# Patient Record
Sex: Male | Born: 1960 | Race: White | Hispanic: No | Marital: Married | Smoking: Never smoker
Health system: Southern US, Community
[De-identification: ages and names within clinical notes are randomized; demographics above are authoritative.]

## PROBLEM LIST (undated history)

## (undated) DIAGNOSIS — Z8719 Personal history of other diseases of the digestive system: Secondary | ICD-10-CM

## (undated) DIAGNOSIS — S83231A Complex tear of medial meniscus, current injury, right knee, initial encounter: Secondary | ICD-10-CM

## (undated) DIAGNOSIS — I10 Essential (primary) hypertension: Secondary | ICD-10-CM

## (undated) DIAGNOSIS — C649 Malignant neoplasm of unspecified kidney, except renal pelvis: Secondary | ICD-10-CM

## (undated) DIAGNOSIS — S83271A Complex tear of lateral meniscus, current injury, right knee, initial encounter: Secondary | ICD-10-CM

## (undated) DIAGNOSIS — M1711 Unilateral primary osteoarthritis, right knee: Secondary | ICD-10-CM

## (undated) DIAGNOSIS — F419 Anxiety disorder, unspecified: Secondary | ICD-10-CM

## (undated) DIAGNOSIS — E039 Hypothyroidism, unspecified: Secondary | ICD-10-CM

## (undated) DIAGNOSIS — E119 Type 2 diabetes mellitus without complications: Secondary | ICD-10-CM

## (undated) DIAGNOSIS — I428 Other cardiomyopathies: Secondary | ICD-10-CM

## (undated) DIAGNOSIS — R112 Nausea with vomiting, unspecified: Secondary | ICD-10-CM

## (undated) DIAGNOSIS — C801 Malignant (primary) neoplasm, unspecified: Secondary | ICD-10-CM

## (undated) DIAGNOSIS — B070 Plantar wart: Secondary | ICD-10-CM

## (undated) DIAGNOSIS — D649 Anemia, unspecified: Secondary | ICD-10-CM

## (undated) DIAGNOSIS — E781 Pure hyperglyceridemia: Secondary | ICD-10-CM

## (undated) DIAGNOSIS — H3530 Unspecified macular degeneration: Secondary | ICD-10-CM

## (undated) DIAGNOSIS — I509 Heart failure, unspecified: Secondary | ICD-10-CM

## (undated) DIAGNOSIS — G629 Polyneuropathy, unspecified: Secondary | ICD-10-CM

## (undated) DIAGNOSIS — I4891 Unspecified atrial fibrillation: Secondary | ICD-10-CM

## (undated) DIAGNOSIS — Z9889 Other specified postprocedural states: Secondary | ICD-10-CM

## (undated) DIAGNOSIS — T7840XA Allergy, unspecified, initial encounter: Secondary | ICD-10-CM

## (undated) HISTORY — DX: Unspecified macular degeneration: H35.30

## (undated) HISTORY — DX: Plantar wart: B07.0

## (undated) HISTORY — PX: NEPHRECTOMY: SHX65

## (undated) HISTORY — DX: Essential (primary) hypertension: I10

## (undated) HISTORY — PX: TONSILLECTOMY: SUR1361

## (undated) HISTORY — PX: VASECTOMY: SHX75

## (undated) HISTORY — DX: Personal history of other diseases of the digestive system: Z87.19

## (undated) HISTORY — DX: Allergy, unspecified, initial encounter: T78.40XA

## (undated) HISTORY — PX: KNEE ARTHROSCOPY: SUR90

## (undated) HISTORY — PX: EYE SURGERY: SHX253

## (undated) HISTORY — DX: Pure hyperglyceridemia: E78.1

## (undated) HISTORY — DX: Anxiety disorder, unspecified: F41.9

---

## 1983-05-28 HISTORY — PX: OTHER SURGICAL HISTORY: SHX169

## 2005-01-21 ENCOUNTER — Ambulatory Visit: Payer: Self-pay | Admitting: Internal Medicine

## 2005-02-18 ENCOUNTER — Ambulatory Visit: Payer: Self-pay | Admitting: Internal Medicine

## 2005-03-04 ENCOUNTER — Ambulatory Visit: Payer: Self-pay | Admitting: Internal Medicine

## 2005-03-06 ENCOUNTER — Ambulatory Visit: Payer: Self-pay | Admitting: Gastroenterology

## 2005-03-22 ENCOUNTER — Ambulatory Visit: Payer: Self-pay | Admitting: Gastroenterology

## 2005-03-22 HISTORY — PX: COLONOSCOPY: SHX174

## 2006-05-23 ENCOUNTER — Ambulatory Visit: Payer: Self-pay | Admitting: Internal Medicine

## 2006-05-23 LAB — CONVERTED CEMR LAB
ALT: 32 units/L (ref 0–40)
Albumin: 3.8 g/dL (ref 3.5–5.2)
Alkaline Phosphatase: 41 units/L (ref 39–117)
Basophils Absolute: 0 10*3/uL (ref 0.0–0.1)
Basophils Relative: 0.6 % (ref 0.0–1.0)
Calcium: 9.1 mg/dL (ref 8.4–10.5)
Cholesterol: 190 mg/dL (ref 0–200)
GFR calc non Af Amer: 86 mL/min
Glomerular Filtration Rate, Af Am: 103 mL/min/{1.73_m2}
Glucose, Bld: 98 mg/dL (ref 70–99)
Hemoglobin, Urine: NEGATIVE
LDL DIRECT: 97.9 mg/dL
Leukocytes, UA: NEGATIVE
Lymphocytes Relative: 30.8 % (ref 12.0–46.0)
PSA: 0.97 ng/mL (ref 0.10–4.00)
Platelets: 233 10*3/uL (ref 150–400)
Potassium: 3.7 meq/L (ref 3.5–5.1)
RBC: 4.75 M/uL (ref 4.22–5.81)
RDW: 12.6 % (ref 11.5–14.6)
TSH: 1.76 microintl units/mL (ref 0.35–5.50)
Total Bilirubin: 0.9 mg/dL (ref 0.3–1.2)
Total Protein: 6.4 g/dL (ref 6.0–8.3)
Urobilinogen, UA: 0.2 (ref 0.0–1.0)
VLDL: 68 mg/dL — ABNORMAL HIGH (ref 0–40)
WBC: 5.6 10*3/uL (ref 4.5–10.5)
pH: 5.5 (ref 5.0–8.0)

## 2006-07-23 ENCOUNTER — Ambulatory Visit: Payer: Self-pay | Admitting: Internal Medicine

## 2006-07-28 ENCOUNTER — Ambulatory Visit: Payer: Self-pay | Admitting: Internal Medicine

## 2007-06-19 ENCOUNTER — Ambulatory Visit: Payer: Self-pay | Admitting: Internal Medicine

## 2007-06-21 DIAGNOSIS — F418 Other specified anxiety disorders: Secondary | ICD-10-CM | POA: Insufficient documentation

## 2007-06-21 DIAGNOSIS — E781 Pure hyperglyceridemia: Secondary | ICD-10-CM | POA: Insufficient documentation

## 2007-06-21 DIAGNOSIS — B070 Plantar wart: Secondary | ICD-10-CM | POA: Insufficient documentation

## 2007-06-21 DIAGNOSIS — I110 Hypertensive heart disease with heart failure: Secondary | ICD-10-CM | POA: Insufficient documentation

## 2007-06-21 DIAGNOSIS — J309 Allergic rhinitis, unspecified: Secondary | ICD-10-CM | POA: Insufficient documentation

## 2007-06-21 DIAGNOSIS — H3530 Unspecified macular degeneration: Secondary | ICD-10-CM | POA: Insufficient documentation

## 2007-06-21 DIAGNOSIS — Z8719 Personal history of other diseases of the digestive system: Secondary | ICD-10-CM | POA: Insufficient documentation

## 2007-08-26 ENCOUNTER — Ambulatory Visit: Payer: Self-pay | Admitting: Psychology

## 2007-09-28 ENCOUNTER — Ambulatory Visit: Payer: Self-pay | Admitting: Psychology

## 2007-11-13 ENCOUNTER — Ambulatory Visit: Payer: Self-pay | Admitting: Psychology

## 2007-11-18 ENCOUNTER — Encounter: Admission: RE | Admit: 2007-11-18 | Discharge: 2007-11-18 | Payer: Self-pay | Admitting: Orthopedic Surgery

## 2007-11-18 ENCOUNTER — Telehealth: Payer: Self-pay | Admitting: Internal Medicine

## 2007-11-25 ENCOUNTER — Ambulatory Visit: Payer: Self-pay | Admitting: Internal Medicine

## 2008-01-04 ENCOUNTER — Ambulatory Visit: Payer: Self-pay | Admitting: Psychology

## 2008-04-08 ENCOUNTER — Ambulatory Visit: Payer: Self-pay | Admitting: Psychology

## 2008-04-18 ENCOUNTER — Telehealth: Payer: Self-pay | Admitting: Internal Medicine

## 2008-06-03 ENCOUNTER — Ambulatory Visit: Payer: Self-pay | Admitting: Psychology

## 2008-06-17 ENCOUNTER — Ambulatory Visit: Payer: Self-pay | Admitting: Psychology

## 2008-07-15 ENCOUNTER — Ambulatory Visit: Payer: Self-pay | Admitting: Psychology

## 2008-08-12 ENCOUNTER — Ambulatory Visit: Payer: Self-pay | Admitting: Psychology

## 2008-09-02 ENCOUNTER — Telehealth: Payer: Self-pay | Admitting: Internal Medicine

## 2008-09-14 ENCOUNTER — Ambulatory Visit: Payer: Self-pay | Admitting: Internal Medicine

## 2008-11-04 ENCOUNTER — Ambulatory Visit: Payer: Self-pay | Admitting: Psychology

## 2008-12-30 ENCOUNTER — Ambulatory Visit: Payer: Self-pay | Admitting: Psychology

## 2009-01-27 ENCOUNTER — Ambulatory Visit: Payer: Self-pay | Admitting: Psychology

## 2009-03-17 ENCOUNTER — Ambulatory Visit: Payer: Self-pay | Admitting: Psychology

## 2009-06-09 ENCOUNTER — Ambulatory Visit: Payer: Self-pay | Admitting: Psychology

## 2009-07-20 ENCOUNTER — Ambulatory Visit: Payer: Self-pay | Admitting: Psychology

## 2009-09-25 ENCOUNTER — Telehealth: Payer: Self-pay | Admitting: Internal Medicine

## 2009-10-16 ENCOUNTER — Ambulatory Visit: Payer: Self-pay | Admitting: Internal Medicine

## 2009-10-16 LAB — CONVERTED CEMR LAB
ALT: 23 units/L (ref 0–53)
Bilirubin Urine: NEGATIVE
Bilirubin, Direct: 0.1 mg/dL (ref 0.0–0.3)
Calcium: 8.8 mg/dL (ref 8.4–10.5)
Cholesterol: 199 mg/dL (ref 0–200)
Creatinine, Ser: 0.9 mg/dL (ref 0.4–1.5)
Eosinophils Absolute: 0.2 10*3/uL (ref 0.0–0.7)
Eosinophils Relative: 4.1 % (ref 0.0–5.0)
GFR calc non Af Amer: 101.38 mL/min (ref >60)
HCT: 41.9 % (ref 39.0–52.0)
HDL: 33.1 mg/dL — ABNORMAL LOW (ref >39.00)
Ketones, ur: NEGATIVE mg/dL
Lymphs Abs: 1.3 10*3/uL (ref 0.7–4.0)
MCHC: 34.8 g/dL (ref 30.0–36.0)
MCV: 94.5 fL (ref 78.0–100.0)
Monocytes Absolute: 0.4 10*3/uL (ref 0.1–1.0)
Neutrophils Relative %: 58.8 % (ref 43.0–77.0)
Nitrite: NEGATIVE
PSA: 1.72 ng/mL (ref 0.10–4.00)
Platelets: 246 10*3/uL (ref 150.0–400.0)
Sodium: 140 meq/L (ref 135–145)
TSH: 1.84 microintl units/mL (ref 0.35–5.50)
Total Bilirubin: 0.8 mg/dL (ref 0.3–1.2)
Total Protein: 6.2 g/dL (ref 6.0–8.3)
Triglycerides: 358 mg/dL — ABNORMAL HIGH (ref 0.0–149.0)
Urobilinogen, UA: 0.2 (ref 0.0–1.0)
pH: 7.5 (ref 5.0–8.0)

## 2009-10-26 ENCOUNTER — Ambulatory Visit: Payer: Self-pay | Admitting: Internal Medicine

## 2009-11-03 ENCOUNTER — Ambulatory Visit: Payer: Self-pay | Admitting: Psychology

## 2010-02-09 ENCOUNTER — Ambulatory Visit: Payer: Self-pay | Admitting: Psychology

## 2010-06-28 NOTE — Assessment & Plan Note (Signed)
Summary: CPX / [employer] / # / CD   Vital Signs:  Patient profile:   50 year old male Height:      74 inches (187.96 cm) Weight:      257.0 pounds (116.82 kg) BMI:     33.12 O2 Sat:      98 % on Room air Temp:     99.2 degrees F (37.33 degrees C) oral Pulse rate:   78 / minute BP sitting:   130 / 92  (left arm) Cuff size:   large  Vitals Entered By: Orlan Leavens (October 26, 2009 10:16 AM)  O2 Flow:  Room air CC: cpx Is Patient Diabetic? No Pain Assessment Patient in pain? no        Primary Care Provider:  Topaz Raglin  CC:  cpx.  History of Present Illness: Presents for a wellness exam. He has been doing well since his last CPX in '09. He has had two visits for acute issues 4/09 and 7/10. He has been seeing Dr. Dellia Cloud intermittently. He has had no health issues: no major illness, injury or surgery.   He does report that his right knee will lock. Had arthroscopy in the past for torn meniscus.  Having some pain right shoulder, worse with work  Current Medications (verified): 1)  Lisinopril-Hydrochlorothiazide 20-25 Mg Tabs (Lisinopril-Hydrochlorothiazide) .Marland Kitchen.. 1 By Mouth Once Daily 2)  Niaspan 500 Mg Tbcr (Niacin (Antihyperlipidemic)) .... Take 1 Tablet By Mouth Once A Day 3)  Paxil Cr 12.5 Mg  Tb24 (Paroxetine Hcl) .... 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   Review of Systems  The patient denies anorexia, fever, weight loss, weight gain, vision loss, decreased hearing, chest pain, dyspnea on exertion, prolonged cough, hemoptysis, abdominal pain, hematochezia, hematuria, muscle weakness, transient blindness, difficulty walking, abnormal bleeding, and angioedema.    Physical Exam  General:  tall large framed white male in no distress Head:  Normocephalic and atraumatic without obvious abnormalities. No apparent alopecia or balding. Eyes:  No corneal or conjunctival inflammation noted. EOMI. Perrla. Funduscopic exam benign, without hemorrhages, exudates or papilledema. Vision grossly normal. Ears:  External ear exam shows no significant lesions or deformities.  Otoscopic examination reveals clear canals, tympanic membranes are intact bilaterally without bulging, retraction, inflammation or discharge. Hearing is grossly normal bilaterally. Nose:  no external deformity and no external erythema.   Mouth:  Oral mucosa and oropharynx without lesions or exudates.  Teeth in good repair. Neck:  supple, full ROM, no thyromegaly, and no carotid bruits.   Chest Wall:  No deformities, masses, tenderness or gynecomastia noted. Lungs:  Normal respiratory effort, chest expands symmetrically. Lungs are clear to auscultation, no crackles or wheezes. Heart:  Normal rate and regular rhythm. S1 and S2 normal without gallop, murmur, click, rub or other extra sounds. Abdomen:  soft, non-tender, normal bowel sounds, no guarding, and no hepatomegaly.   Rectal:  No external abnormalities noted. Normal sphincter tone. No rectal masses or tenderness. Prostate:  Prostate gland firm and smooth, no  enlargement, nodularity, tenderness, mass, asymmetry or induration. Msk:  normal ROM, no joint tenderness, no joint swelling, no joint warmth, and no joint instability.   Pulses:  2+ radial pulses Extremities:  No clubbing, cyanosis, edema, or deformity noted with normal full range of motion of all joints.   Neurologic:  alert & oriented X3, cranial nerves II-XII intact, strength normal in all extremities, gait normal, and DTRs symmetrical and normal.   Skin:  turgor normal, color normal, and no suspicious lesions.   Cervical Nodes:  no anterior cervical adenopathy and no posterior cervical adenopathy.   Inguinal Nodes:  no R inguinal adenopathy and no L inguinal adenopathy.   Psych:  Oriented X3, memory intact for recent and remote, normally interactive, good eye contact, and not anxious appearing.     Impression & Recommendations:  Problem # 1:  ANXIETY DISORDER, ACUTE (ICD-300.00) Patient is doing well by his report. He will still check in with Dr. Dellia Cloud as needed.  Plan - continue present meds.  His updated medication list for this problem includes:    Paxil Cr 12.5 Mg Tb24 (Paroxetine hcl) .Marland Kitchen... 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  Plan - drug holiday with repeat lipid panel in 6-8 weeks. If triglycerides are unchanged but LDL is higher will consider alternative medication, e.g. statin product.  His updated medication list for this problem includes:    Niaspan 500 Mg Tbcr (Niacin (antihyperlipidemic)) .Marland Kitchen... Take 1 tablet by mouth once a day  Problem # 3:  HYPERTENSION (ICD-401.9)  His updated medication list for this problem includes:    Lisinopril-hydrochlorothiazide 20-25 Mg Tabs (Lisinopril-hydrochlorothiazide) .Marland Kitchen... 1 by mouth once daily  BP today: 130/92 Prior BP: 142/94 (09/14/2008)  Labs  Reviewed: K+: 4.0 (10/16/2009) Creat: : 0.9 (10/16/2009)     Good control on present medication - will continue the same.   Problem # 4:  Preventive Health Care (ICD-V70.0) unremarkable interval history and a normal exam. Lab results are fine except for elevation in Triglycerides. Colonoscopy in '06, tetnus booster in '10. Discussed the need for continuing a regular exercise program for stress mangement and for weight management.  In summary - a very nice man who is medically stable on his present regimen. He will return for lab as noted and for routine exam in 1 year.  Complete Medication List: 1)  Lisinopril-hydrochlorothiazide 20-25 Mg Tabs (Lisinopril-hydrochlorothiazide) .Marland Kitchen.. 1 by mouth once daily 2)  Niaspan 500 Mg Tbcr (Niacin (antihyperlipidemic)) .... Take 1 tablet by mouth once a day 3)  Paxil Cr 12.5 Mg Tb24 (Paroxetine hcl) .... Take 1 tablet by mouth once a day  Other Orders: EKG w/ Interpretation (93000) Orthopedic Surgeon Referral (Ortho Surgeon)  Patient: Gregory [name] Note: All result statuses are Final unless otherwise noted.  Tests: (1) BMP (METABOL)   Sodium  140 mEq/L                   135-145   Potassium                 4.0 mEq/L                   3.5-5.1   Chloride                  103 mEq/L                   96-112   Carbon Dioxide            32 mEq/L                    19-32   Glucose                   89 mg/dL                    44-03   BUN                  [H]  24 mg/dL                    4-74   Creatinine                0.9 mg/dL                   2.5-9.5   Calcium                   8.8 mg/dL                   6.3-87.5   GFR                       101.38 mL/min               >60  Tests: (2) Lipid Panel (LIPID)   Cholesterol               199 mg/dL                   6-433     ATP III Classification            Desirable:  < 200 mg/dL                    Borderline High:  200 - 239 mg/dL               High:  > = 240 mg/dL   Triglycerides         [H]  358.0 mg/dL                 2.9-518.8     Normal:  <150 mg/dL     Borderline High:  416 - 199 mg/dL   HDL                  [L]  60.63 mg/dL                 >01.60   VLDL Cholesterol     [H]  71.6 mg/dL                  1.0-93.2  CHO/HDL Ratio:  CHD Risk  6                    Men          Women     1/2 Average Risk     3.4          3.3     Average Risk          5.0          4.4     2X Average Risk          9.6          7.1     3X Average Risk          15.0          11.0                           Tests: (3) CBC Platelet w/Diff (CBCD)   White Cell Count          4.8 K/uL                    4.5-10.5   Red Cell Count            4.44 Mil/uL                 4.22-5.81   Hemoglobin                14.6 g/dL                   16.1-09.6   Hematocrit                41.9 %                      39.0-52.0   MCV                       94.5 fl                     78.0-100.0   MCHC                      34.8 g/dL                   04.5-40.9   RDW                       13.5 %                      11.5-14.6   Platelet Count            246.0 K/uL                  150.0-400.0   Neutrophil %              58.8 %                      43.0-77.0   Lymphocyte %              27.3 %                      12.0-46.0   Monocyte %                9.2 %  3.0-12.0   Eosinophils%              4.1 %                       0.0-5.0   Basophils %               0.6 %                       0.0-3.0   Neutrophill Absolute      2.8 K/uL                    1.4-7.7   Lymphocyte Absolute       1.3 K/uL                    0.7-4.0   Monocyte Absolute         0.4 K/uL                    0.1-1.0  Eosinophils, Absolute                             0.2 K/uL                    0.0-0.7   Basophils Absolute        0.0 K/uL                    0.0-0.1  Tests: (4) Hepatic/Liver Function Panel (HEPATIC)   Total Bilirubin           0.8 mg/dL                   1.6-1.0   Direct Bilirubin           0.1 mg/dL                   9.6-0.4   Alkaline Phosphatase      45 U/L                      39-117   AST                       24 U/L                      0-37   ALT                       23 U/L                      0-53   Total Protein             6.2 g/dL                    5.4-0.9   Albumin                   4.1 g/dL                    8.1-1.9  Tests: (5) TSH (TSH)   FastTSH                   1.84 uIU/mL  0.35-5.50  Tests: (6) Prostate Specific Antigen (PSA)   PSA-Hyb                   1.72 ng/mL                  0.10-4.00  Tests: (7) UDip w/Micro (URINE)   Color                     YELLOW       RANGE:  Yellow;Lt. Yellow   Clarity                   CLEAR                       Clear   Specific Gravity          1.015                       1.000 - 1.030   Urine Ph                  7.5                         5.0-8.0   Protein                   NEGATIVE                    Negative   Urine Glucose             NEGATIVE                    Negative   Ketones                   NEGATIVE                    Negative   Urine Bilirubin           NEGATIVE                    Negative   Blood                     NEGATIVE                    Negative   Urobilinogen              0.2                         0.0 - 1.0   Leukocyte Esterace        TRACE                       Negative   Nitrite                   NEGATIVE                    Negative   Urine WBC                 3-6/hpf                     0-2/hpf   Urine Epith  Rare(0-4/hpf)               Rare(0-4/hpf)   Urine Bacteria            Rare(<10/hpf)               None     amorphous sediment present  Tests: (8) Cholesterol LDL - Direct (DIRLDL)  Cholesterol LDL - Direct                             110.9 mg/dL

## 2010-06-28 NOTE — Progress Notes (Signed)
Summary: REFILLs - [facility]  Phone Note Refill Request   Refills Requested: Medication #1:  LISINOPRIL-HYDROCHLOROTHIAZIDE 20-25 MG TABS 1 by mouth once daily  Medication #2:  NIASPAN 500 MG TBCR Take 1 tablet by mouth once a day  Medication #3:  PAXIL CR 12.5 MG  TB24 Take 1 tablet by mouth once a day Pt req rx for 3 mth supply to go to [facility]. OK?  Initial call taken by: Lamar Sprinkles, CMA,  Sep 25, 2009 11:02 AM  Follow-up for Phone Call        last seen by Dr. Jonny Ruiz in april - ok to refill medications as requested.  Follow-up by: Jacques Navy MD,  Sep 25, 2009 12:55 PM    Prescriptions: PAXIL CR 12.5 MG  TB24 (PAROXETINE HCL) Take 1 tablet by mouth once a day  #90 x 3   Entered by:   Lamar Sprinkles, CMA   Authorized by:   Jacques Navy MD   Signed by:   Lamar Sprinkles, CMA on 09/25/2009   Method used:   Faxed to ...       [facility]* (mail-order)             ,          Ph: [phone]       Fax: [phone]   RxID:   [id number] NIASPAN 500 MG TBCR (NIACIN (ANTIHYPERLIPIDEMIC)) Take 1 tablet by mouth once a day  #90 x 3   Entered by:   Lamar Sprinkles, CMA   Authorized by:   Jacques Navy MD   Signed by:   Lamar Sprinkles, CMA on 09/25/2009   Method used:   Faxed to ...       [facility]* (mail-order)             ,          Ph: [phone]       Fax: [phone]   RxID:   [id number] LISINOPRIL-HYDROCHLOROTHIAZIDE 20-25 MG TABS (LISINOPRIL-HYDROCHLOROTHIAZIDE) 1 by mouth once daily  #90 x 3   Entered by:   Lamar Sprinkles, CMA   Authorized by:   Jacques Navy MD   Signed by:   Lamar Sprinkles, CMA on 09/25/2009   Method used:   Faxed to ...       [facility]* (mail-order)             ,          Ph: [phone]       Fax: [phone]   RxID:   [id number]

## 2010-09-10 ENCOUNTER — Other Ambulatory Visit: Payer: Self-pay | Admitting: Internal Medicine

## 2010-10-12 NOTE — Assessment & Plan Note (Signed)
[facility]                           PRIMARY CARE OFFICE NOTE   NAME:Gregory [name], Gregory [name]                        MRN:          [id number]  DATE:07/28/2006                            DOB:          01/01/1961    Gregory [name] is a 50 year old gentleman who presents for a wellness  examination and followup of his hypertension.  He was last seen in the  office on July 23, 2006, for what appeared to be a possible  sinusitis treated with Ceftin, nasal spray.   The patient reports he has had no other medical problems or issues since  his last exam March 04, 2005.   PAST MEDICAL HISTORY:  Surgical:  1. Tonsillectomy.  2. Fractured fibula with repair.  3. Sebaceous cyst removed from left back in 1985.  4. Vasectomy in 2002.   Medical illnesses:  1. Usual childhood disease.  2. Hypertension.  3. Acute macular disease August 2003.  4. GI bleed, August 2006.  5. History of recurrent sinus congestion and infections in the past.   CURRENT MEDICATIONS:  Lisinopril HCT 20/12.5 once daily.   HABITS:  Tobacco:  None.  Alcohol:  Social.   FAMILY HISTORY:  Positive for bipolar disease and hypertension in his  father.  Mother passed away from a brain tumor in 2001.  No history of  colon cancer, coronary artery disease or diabetes.   SOCIAL HISTORY:  The patient is married for 18 years.  He has a 12-year-  old son and a 37 year old daughter and a 55-year-old son.  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  He recently has gone through a year-long business  venture, buying out his partner, expanding his ownership and equity  positions.  He now has a larger territory for marketing his product, has  22 employees.  This has taken away time from his personal health and  workout regimen.   REVIEW OF SYSTEMS:  The patient has had no constitutional,  cardiovascular, respiratory, GI, GU, or musculoskeletal  complaints.   EXAMINATION:  VITAL SIGNS:  Temperature is 97.2, blood pressure 128/80,  pulse 69, weight 256, height 6 feet 4 inches.  GENERAL APPEARANCE:  This is a heavyset Caucasian gentleman in no acute  distress.  HEENT:  Normocephalic, atraumatic.  EACs and TMs were unremarkable.  Oropharynx with native dentition in good repair.  No buccal or palatal  lesions were noted.  Posterior pharynx was clear.  Conjunctivae and  sclerae were clear.  PERRLA, EOMI.  Funduscopic exam was unremarkable.  NECK:  Supple without thyromegaly.  NODES:  No adenopathy was noted in the cervical or supraclavicular  regions.  CHEST:  No CVA tenderness.  LUNGS:  Clear to auscultation and percussion.  CARDIOVASCULAR:  Shows 2+ radial pulse, no JVD or carotid bruits.  He  had a quiet precordium with regular rate and rhythm without murmurs,  rubs or gallops.  ABDOMEN:  Soft, no guarding, no rebound.  No organomegaly was noted.  GENITALIA:  Normal male phallus.  Bilaterally descended testicles  without masses.  RECTAL:  Revealed normal sphincter tone.  Prostate was smooth, normal in  size and contour.  EXTREMITIES:  Without clubbing, cyanosis, edema or deformity.  NEUROLOGIC:  Nonfocal.   Laboratory from May 23, 2006:  Hemoglobin 15.2 g, white count was  5600 with normal differential.  Cholesterol was 190, triglycerides 341,  HDL 26.5, LDL cholesterol 16.1.  Chemistries were normal with a blood  sugar of 98.  Electrolytes were normal.  Kidney function normal with a  creatinine of 1.0 and GFR of 86 mL/minute.  Liver functions were normal.  Thyroid function normal with a TSH of 1.76.  PSA was normal at 0.97.  Urinalysis was normal.   ASSESSMENT AND PLAN:  1. Hypertension:  Excellent control on his present regimen.  He will      continue the same.  2. Hypertriglyceridemia:  In reviewing the patient's chart, he has had      elevated triglyceride level going back to 2000.  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  The patient also with a      chronically-low HDL.  Plan:  The patient is to start Niaspan 500 mg      q.h.s., prescription provided, with the plan to advance to 1000 mg      over time but will check a repeat lipid panel in 4-6 weeks before      increasing his dose.  The patient also with lifestyle treatment to      increase his exercise and be more careful with his diet.  If he      loses 10% of body weight or 25 pounds, will consider a drug holiday      in regards to lipid management.  3. Health maintenance:  The patient did undergo colonoscopy March 22, 2005, which was a normal study except for internal hemorrhoids.      He will be due for a followup in 2016.  Prostate exam was      unremarkable.  PSA is stable.   In summary, this is a very pleasant gentleman who is medically stable at  this time.  He will return in 4-6 weeks for followup lipid panel.     Rosalyn Gess Norins, MD  Electronically Signed    MEN/MedQ  DD: 07/29/2006  DT: 07/29/2006  Job #: [id number]   cc:   Madaline Savage[employer]

## 2010-10-15 ENCOUNTER — Telehealth: Payer: Self-pay | Admitting: Internal Medicine

## 2010-10-15 MED ORDER — PAROXETINE HCL ER 12.5 MG PO TB24: 12.5000 mg | ORAL_TABLET | ORAL | Status: DC

## 2010-10-15 NOTE — Telephone Encounter (Signed)
Pt needs 3 mos supply of Paroxetine. Pt states he only rec'd 1 mo supply although he requested 3 mos supply. He did contact [facility] and they advised him to call his PCP and request kit be the 3 mos supply that is sent in and/or new prescription stating this. Pt has 4 pills left. Pt states he did send an email to Dr Debby Bud regarding this as well

## 2010-10-15 NOTE — Telephone Encounter (Signed)
Left vm for pt, RF sent to [facility]

## 2010-10-15 NOTE — Telephone Encounter (Signed)
Ok for refill 90 days with 3 refills. If needed 14 day local Rx is ok.

## 2011-03-25 ENCOUNTER — Encounter (INDEPENDENT_AMBULATORY_CARE_PROVIDER_SITE_OTHER): Payer: 59 | Admitting: Ophthalmology

## 2011-03-25 DIAGNOSIS — H43819 Vitreous degeneration, unspecified eye: Secondary | ICD-10-CM

## 2011-03-25 DIAGNOSIS — H3530 Unspecified macular degeneration: Secondary | ICD-10-CM

## 2011-03-25 DIAGNOSIS — H2510 Age-related nuclear cataract, unspecified eye: Secondary | ICD-10-CM

## 2011-07-06 ENCOUNTER — Other Ambulatory Visit: Payer: Self-pay | Admitting: Internal Medicine

## 2011-07-15 ENCOUNTER — Telehealth: Payer: Self-pay | Admitting: Internal Medicine

## 2011-07-15 MED ORDER — LISINOPRIL-HYDROCHLOROTHIAZIDE 20-25 MG PO TABS: ORAL_TABLET | ORAL | Status: DC

## 2011-07-15 NOTE — Telephone Encounter (Signed)
Ok fopr refill on lisinopril for 90 days

## 2011-07-15 NOTE — Telephone Encounter (Signed)
Done

## 2011-07-15 NOTE — Telephone Encounter (Signed)
PT HAS MADE HIS PHYSICAL APPT FOR MARCH 26.  HE NEEDS REFILLS ON LISOPRIL.  HE GETS A 90 DAY SUPPLY FROM [facility].

## 2011-08-20 ENCOUNTER — Other Ambulatory Visit (INDEPENDENT_AMBULATORY_CARE_PROVIDER_SITE_OTHER): Payer: 59

## 2011-08-20 ENCOUNTER — Ambulatory Visit (INDEPENDENT_AMBULATORY_CARE_PROVIDER_SITE_OTHER): Payer: 59 | Admitting: Internal Medicine

## 2011-08-20 ENCOUNTER — Encounter: Payer: Self-pay | Admitting: Internal Medicine

## 2011-08-20 VITALS — BP 122/84 | HR 67 | Temp 97.5°F | Resp 14 | Ht 72.25 in | Wt 261.8 lb

## 2011-08-20 DIAGNOSIS — Z0000 Encounter for general adult medical examination without abnormal findings: Secondary | ICD-10-CM

## 2011-08-20 DIAGNOSIS — Z125 Encounter for screening for malignant neoplasm of prostate: Secondary | ICD-10-CM

## 2011-08-20 DIAGNOSIS — I10 Essential (primary) hypertension: Secondary | ICD-10-CM

## 2011-08-20 DIAGNOSIS — E781 Pure hyperglyceridemia: Secondary | ICD-10-CM

## 2011-08-20 DIAGNOSIS — E669 Obesity, unspecified: Secondary | ICD-10-CM

## 2011-08-20 DIAGNOSIS — M25519 Pain in unspecified shoulder: Secondary | ICD-10-CM

## 2011-08-20 DIAGNOSIS — N489 Disorder of penis, unspecified: Secondary | ICD-10-CM

## 2011-08-20 DIAGNOSIS — M25512 Pain in left shoulder: Secondary | ICD-10-CM

## 2011-08-20 DIAGNOSIS — F411 Generalized anxiety disorder: Secondary | ICD-10-CM

## 2011-08-20 DIAGNOSIS — N4889 Other specified disorders of penis: Secondary | ICD-10-CM

## 2011-08-20 LAB — COMPREHENSIVE METABOLIC PANEL
ALT: 34 U/L (ref 0–53)
AST: 32 U/L (ref 0–37)
Albumin: 4.3 g/dL (ref 3.5–5.2)
Alkaline Phosphatase: 40 U/L (ref 39–117)
Potassium: 4.2 mEq/L (ref 3.5–5.1)
Sodium: 137 mEq/L (ref 135–145)
Total Bilirubin: 0.9 mg/dL (ref 0.3–1.2)
Total Protein: 7.3 g/dL (ref 6.0–8.3)

## 2011-08-20 LAB — LIPID PANEL
HDL: 33.3 mg/dL — ABNORMAL LOW (ref >39.00)
Total CHOL/HDL Ratio: 6
Triglycerides: 280 mg/dL — ABNORMAL HIGH (ref 0.0–149.0)
VLDL: 56 mg/dL — ABNORMAL HIGH (ref 0.0–40.0)

## 2011-08-20 LAB — TSH: TSH: 1.41 u[IU]/mL (ref 0.35–5.50)

## 2011-08-20 NOTE — Progress Notes (Signed)
Subjective:    Patient ID: Gregory [name], male    DOB: [birth year], 51 y.o.   MRN: [id number]  HPI Gregory [name] presents for rouitne medical exam. CC: painful left shoulder question of internal derangement-torn rotator. He has had a penile infection - yeast possibly for many months which will presents a dry,cracking of the skin of the shaft. He also had a friction type injury at the base of the shaft. He is very concerned about weight management and we discussed the principles of weight management.   Past History:  Past Medical History: Last updated: Jun 25, 2007 UCD Allergic rhinitis Gastrointestinal hemorrhage, hx of Aug '06 Hypertension Macular disease, acute August '03 hypertriglyceridemia plantar warts anxiety/stress    Physician Roster:           Derm - lupton           GU- Dahlstedt           Opthal Jena Gauss  Past Surgical History: Last updated: Jun 25, 2007 Tonsillectomy Vasectomy '02 ORIF fx fibula excision sebaceous cyst - back '85 Arthroscopic surgery right knee - repair or torn miniscus '00, '12 (Ron Gioffre) - with 2nd repair had superficial infection Family History: Last updated: 06-25-07 father- deceased; HTN, bipolar disease mother - deceased brain tumor Neg - colon, prostate cancer, CAD, DM  Social History: Reviewed history from 06/25/2007 and no changes required. 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    Review of Systems Constitutional:  Negative for fever, chills, activity change and unexpected weight change.  HEENT:  Negative for hearing loss, ear pain, congestion, neck stiffness and postnasal drip. Negative for sore throat or swallowing problems. Negative for dental complaints.   Eyes: Negative for vision loss or change in visual acuity.  Respiratory: Negative for chest tightness and wheezing. Negative for DOE.     Cardiovascular: Negative for chest pain or palpitations. No decreased exercise tolerance Gastrointestinal: No change in bowel habit. No bloating or gas. No reflux or indigestion Genitourinary: Negative for urgency, frequency, flank pain and difficulty urinating.  Musculoskeletal: Negative for myalgias, back pain, arthralgias and gait problem.  Neurological: Negative for dizziness, tremors, weakness and headaches.  Hematological: Negative for adenopathy.  Psychiatric/Behavioral: Negative for behavioral problems and dysphoric mood.       Objective:   Physical Exam Filed Vitals:   08/20/11 1439  BP: 122/84  Pulse: 67  Temp: 97.5 F (36.4 C)  Resp: 14   Wt Readings from Last 3 Encounters:  08/20/11 261 lb 12 oz (118.729 kg)  10/26/09 257 lb (116.574 kg)  09/14/08 259 lb 6.1 oz (117.655 kg)   Gen'l: Well nourished well developed overweight white man in no acute distress  HEENT: Head: Normocephalic and atraumatic. Right Ear: External ear normal. EAC/TM nl. Left Ear: External ear normal.  EAC/TM nl. Nose: Nose normal. Mouth/Throat: Oropharynx is clear and moist. Dentition - native, in good repair. No buccal or palatal lesions. Posterior pharynx clear. Eyes: Conjunctivae and sclera clear. EOM intact. Pupils are equal, round, and reactive to light. Right eye exhibits no discharge. Left eye exhibits no discharge. Neck: Normal range of motion. Neck supple. No JVD present. No tracheal deviation present. No thyromegaly present.  Cardiovascular: Normal rate, regular rhythm, no gallop, no friction rub, no murmur heard.      Quiet precordium. 2+ radial and DP pulses . No carotid bruits Pulmonary/Chest: Effort normal.  No respiratory distress or increased WOB, no wheezes, no rales. No chest wall deformity or CVAT. Abdominal: Soft. Bowel sounds are normal in all quadrants. He exhibits no distension, no tenderness, no rebound or guarding, No heptosplenomegaly  Genitourinary:  Glans penis and distal  shaft with skin change - thickened white skin, firm with a central ulcer just below the frenulum. No urethral discharge. Musculoskeletal: Normal range of motion. He exhibits no edema and no tenderness.       Small and large joints without redness, synovial thickening or deformity. Full range of motion preserved about all small, median and large joints, including the left shoulder.  Lymphadenopathy:    He has no cervical or supraclavicular adenopathy.  Neurological: He is alert and oriented to person, place, and time. CN II-XII intact. DTRs 2+ and symmetrical biceps, radial and patellar tendons. Cerebellar function normal with no tremor, rigidity, normal gait and station.  Skin: Skin is warm and dry. No rash noted. No erythema. Glans penis and below with skin thickening and pallor, shiny appearance with ulceration 2 mm diameter. Psychiatric: He has a normal mood and affect. His behavior is normal. Thought content normal.   Lab Results  Component Value Date   WBC 4.8 10/16/2009   HGB 14.6 10/16/2009   HCT 41.9 10/16/2009   PLT 246.0 10/16/2009   GLUCOSE 83 08/20/2011   CHOL 197 08/20/2011   TRIG 280.0* 08/20/2011   HDL 33.30* 08/20/2011   LDLDIRECT 117.5 08/20/2011   ALT 34 08/20/2011   AST 32 08/20/2011   NA 137 08/20/2011   K 4.2 08/20/2011   CL 98 08/20/2011   CREATININE 0.9 08/20/2011   BUN 20 08/20/2011   CO2 30 08/20/2011   TSH 1.41 08/20/2011   PSA 1.17 08/20/2011         Assessment & Plan:  Left shoulder pain - chronic problem Plan- he is given the name of Dr. Annell Greening for ortho consult. He will handle his own appointment scheduling  Skin change penis - this does not look like a yeast infection. Uncertain as to diagnosis Plan - he has seen Dr. Terri Piedra in the past and will schedule his own appointment for evaluation.

## 2011-08-22 DIAGNOSIS — Z0000 Encounter for general adult medical examination without abnormal findings: Secondary | ICD-10-CM | POA: Insufficient documentation

## 2011-08-22 DIAGNOSIS — E669 Obesity, unspecified: Secondary | ICD-10-CM | POA: Insufficient documentation

## 2011-08-22 NOTE — Assessment & Plan Note (Signed)
Discussed weight management: smart food choices, PORTION SIZE - meal of 1,000 chews, regular exercise  Target weight 250 lbs; goal to loose 1-2 lbs per month

## 2011-08-22 NOTE — Assessment & Plan Note (Signed)
triglycerides still mildly elevated. LDL is better than goal of 130 or less.  Plan - life-style management with a low fat low carb diet and regular exercise.

## 2011-08-22 NOTE — Assessment & Plan Note (Signed)
Interval medical history is negative for any serious illness, surgery or injury. PHysical exam notable for skin change penis otherwise normal. Lab results are good. He is a candidate for colonoscopy if not already done. Immunizations: tetanus is current.  He will be referred to ortho Gregory [name] for shoulder evaluation. He will make his own appointment with Dr. Terri [name] in regard to the skin changes distal penis.   He will return for routine follow-up in 12 months, sooner as needed.

## 2011-08-22 NOTE — Assessment & Plan Note (Signed)
Very stable. He continues on low dose paxil, tolerated well.

## 2011-08-22 NOTE — Assessment & Plan Note (Signed)
BP Readings from Last 3 Encounters:  08/20/11 122/84  10/26/09 130/92  09/14/08 142/94   Adequate control on present medication

## 2011-10-08 ENCOUNTER — Telehealth: Payer: Self-pay | Admitting: *Deleted

## 2011-10-08 ENCOUNTER — Other Ambulatory Visit: Payer: Self-pay

## 2011-10-08 MED ORDER — PAROXETINE HCL ER 12.5 MG PO TB24: 12.5000 mg | ORAL_TABLET | ORAL | Status: DC

## 2011-10-08 MED ORDER — LISINOPRIL-HYDROCHLOROTHIAZIDE 20-25 MG PO TABS: ORAL_TABLET | ORAL | Status: DC

## 2011-10-08 NOTE — Telephone Encounter (Signed)
Sent refill back to [facility]... 10/08/1322:20pm/LMB

## 2012-01-01 ENCOUNTER — Other Ambulatory Visit: Payer: Self-pay

## 2012-01-01 MED ORDER — LISINOPRIL-HYDROCHLOROTHIAZIDE 20-25 MG PO TABS: ORAL_TABLET | ORAL | Status: DC

## 2012-03-25 ENCOUNTER — Encounter (INDEPENDENT_AMBULATORY_CARE_PROVIDER_SITE_OTHER): Payer: 59 | Admitting: Ophthalmology

## 2012-03-25 DIAGNOSIS — H3530 Unspecified macular degeneration: Secondary | ICD-10-CM

## 2012-03-25 DIAGNOSIS — H43819 Vitreous degeneration, unspecified eye: Secondary | ICD-10-CM

## 2012-03-25 DIAGNOSIS — H2510 Age-related nuclear cataract, unspecified eye: Secondary | ICD-10-CM

## 2012-04-09 ENCOUNTER — Other Ambulatory Visit: Payer: Self-pay | Admitting: Internal Medicine

## 2012-07-14 ENCOUNTER — Other Ambulatory Visit: Payer: Self-pay | Admitting: Internal Medicine

## 2012-08-24 ENCOUNTER — Telehealth: Payer: Self-pay | Admitting: Internal Medicine

## 2012-08-24 NOTE — Telephone Encounter (Signed)
Yes, he can have refills as requested.

## 2012-08-24 NOTE — Telephone Encounter (Signed)
We called Mr. Wilczak to reschedule his April 16th physical to June 5.  He is concerned about refills.  Could he have a refill on the Paxil-CR and Lisinopril to last until June 5?  His last physical was August 20, 2011 He uses [facility] in pharmacy.

## 2012-08-25 MED ORDER — PAROXETINE HCL ER 12.5 MG PO TB24: 12.5000 mg | ORAL_TABLET | ORAL | Status: DC

## 2012-08-25 MED ORDER — LISINOPRIL-HYDROCHLOROTHIAZIDE 20-25 MG PO TABS: 1.0000 | ORAL_TABLET | Freq: Every day | ORAL | Status: DC

## 2012-08-25 NOTE — Telephone Encounter (Signed)
Paxil-CR and Lisinopril sent to [facility]

## 2012-09-09 ENCOUNTER — Encounter: Payer: 59 | Admitting: Internal Medicine

## 2012-10-06 ENCOUNTER — Encounter: Payer: Self-pay | Admitting: Internal Medicine

## 2012-10-29 ENCOUNTER — Ambulatory Visit (INDEPENDENT_AMBULATORY_CARE_PROVIDER_SITE_OTHER): Payer: 59 | Admitting: Internal Medicine

## 2012-10-29 ENCOUNTER — Encounter: Payer: Self-pay | Admitting: Internal Medicine

## 2012-10-29 ENCOUNTER — Other Ambulatory Visit: Payer: 59

## 2012-10-29 VITALS — BP 146/92 | HR 68 | Temp 97.8°F | Ht 74.0 in | Wt 264.4 lb

## 2012-10-29 DIAGNOSIS — F411 Generalized anxiety disorder: Secondary | ICD-10-CM

## 2012-10-29 DIAGNOSIS — Z0000 Encounter for general adult medical examination without abnormal findings: Secondary | ICD-10-CM

## 2012-10-29 DIAGNOSIS — I10 Essential (primary) hypertension: Secondary | ICD-10-CM

## 2012-10-29 DIAGNOSIS — E669 Obesity, unspecified: Secondary | ICD-10-CM

## 2012-10-29 DIAGNOSIS — H3530 Unspecified macular degeneration: Secondary | ICD-10-CM

## 2012-10-29 DIAGNOSIS — M25512 Pain in left shoulder: Secondary | ICD-10-CM

## 2012-10-29 DIAGNOSIS — M25519 Pain in unspecified shoulder: Secondary | ICD-10-CM

## 2012-10-29 LAB — COMPREHENSIVE METABOLIC PANEL
Albumin: 4.1 g/dL (ref 3.5–5.2)
Alkaline Phosphatase: 39 U/L (ref 39–117)
CO2: 33 mEq/L — ABNORMAL HIGH (ref 19–32)
Calcium: 9.4 mg/dL (ref 8.4–10.5)
Chloride: 100 mEq/L (ref 96–112)
Glucose, Bld: 88 mg/dL (ref 70–99)
Potassium: 4.2 mEq/L (ref 3.5–5.1)
Sodium: 139 mEq/L (ref 135–145)
Total Protein: 7.3 g/dL (ref 6.0–8.3)

## 2012-10-29 MED ORDER — PAROXETINE HCL ER 12.5 MG PO TB24: 12.5000 mg | ORAL_TABLET | ORAL | Status: DC

## 2012-10-29 MED ORDER — LISINOPRIL-HYDROCHLOROTHIAZIDE 20-25 MG PO TABS: 1.0000 | ORAL_TABLET | Freq: Every day | ORAL | Status: DC

## 2012-10-29 NOTE — Assessment & Plan Note (Signed)
Stable and doing well on continued use of Paxil which he is tolerating w/o adverse side effects.  Plan Continue present medication

## 2012-10-29 NOTE — Progress Notes (Signed)
Subjective:    Patient ID: Gregory [name], male    DOB: 01-Jul-1960, 52 y.o.   MRN: [id number]  HPI Gregory [name] presents for routine medical/wellness follow up. He has had good year - fast and furious. No health issues except for left shoulder, rotator cuff. He is still concerned about weight.  Past Medical History  Diagnosis Date  . Allergy   . Hypertension   . Anxiety   . Plantar warts   . Hypertriglyceridemia   . History of gastrointestinal hemorrhage    Past Surgical History  Procedure Laterality Date  . Tonsillectomy    . Vasectomy    . Sebaceous cyst excision  1985  . Orif fx fibula    . Colonoscopy  03/22/2005   Family History  Problem Relation Age of Onset  . Hypertension Mother   . Bipolar disorder Mother   . Other Father     brain tumor   History   Social History  . Marital Status: Married    Spouse Name: N/A    Number of Children: N/A  . Years of Education: N/A   Occupational History  . Not on file.   Social History Main Topics  . Smoking status: Never Smoker   . Smokeless tobacco: Not on file  . Alcohol Use: Yes     Comment: social  . Drug Use: No  . Sexually Active: Not on file   Other Topics Concern  . 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     Current Outpatient Prescriptions on File Prior to Visit  Medication Sig Dispense Refill  . lisinopril-hydrochlorothiazide (PRINZIDE,ZESTORETIC) 20-25 MG per tablet Take 1 tablet by mouth daily.  90 tablet  0  . PARoxetine (PAXIL-CR) 12.5 MG 24 hr tablet Take 1 tablet (12.5 mg total) by mouth every morning.  90 tablet  0   No current facility-administered medications on file prior to visit.      Review of Systems Constitutional:  Negative for fever, chills, activity change and unexpected weight change.  HEENT:  Negative for  hearing loss, ear pain, congestion, neck stiffness and postnasal drip. Negative for sore throat or swallowing problems. Negative for dental complaints.   Eyes: Negative for vision loss or change in visual acuity.  Respiratory: Negative for chest tightness and wheezing. Negative for DOE.   Cardiovascular: Negative for chest pain or palpitations. No decreased exercise tolerance Gastrointestinal: No change in bowel habit. No bloating or gas. No reflux or indigestion Genitourinary: Negative for urgency, frequency, flank pain and difficulty urinating.  Musculoskeletal: Negative for myalgias, back pain, arthralgias and gait problem.  Neurological: Negative for dizziness, tremors, weakness and headaches.  Hematological: Negative for adenopathy.  Psychiatric/Behavioral: Negative for behavioral problems and dysphoric mood.       Objective:   Physical Exam Filed Vitals:   10/29/12 1332  BP: 146/92  Pulse: 68  Temp: 97.8 F (36.6 C)   Wt Readings from Last 3 Encounters:  10/29/12 264 lb 6.4 oz (119.931 kg)  08/20/11 261 lb 12 oz (118.729 kg)  10/26/09 257 lb (116.574 kg)   Gen'l: Well nourished well developed white male in no acute distress  HEENT: Head: Normocephalic and atraumatic. Right Ear: External ear normal. EAC/TM nl. Left Ear: External ear normal.  EAC/TM nl. Nose:  Nose normal. Mouth/Throat: Oropharynx is clear and moist. Dentition - native, in good repair. No buccal or palatal lesions. Posterior pharynx clear. Eyes: Conjunctivae and sclera clear. EOM intact. Pupils are equal, round, and reactive to light. Right eye exhibits no discharge. Left eye exhibits no discharge. Neck: Normal range of motion. Neck supple. No JVD present. No tracheal deviation present. No thyromegaly present.  Cardiovascular: Normal rate, regular rhythm, no gallop, no friction rub, no murmur heard.      Quiet precordium. 2+ radial and DP pulses . No carotid bruits Pulmonary/Chest: Effort normal. No respiratory  distress or increased WOB, no wheezes, no rales. No chest wall deformity or CVAT. Abdomen: Soft. Bowel sounds are normal in all quadrants. He exhibits no distension, no tenderness, no rebound or guarding, No heptosplenomegaly  Genitourinary:  deferred Musculoskeletal: Normal range of motion. He exhibits no edema and no tenderness.       Small and large joints without redness, synovial thickening or deformity. Full range of motion preserved about all small, median and large joints.  Lymphadenopathy:    He has no cervical or supraclavicular adenopathy.  Neurological: He is alert and oriented to person, place, and time. CN II-XII intact. DTRs 2+ and symmetrical biceps, radial and patellar tendons. Cerebellar function normal with no tremor, rigidity, normal gait and station.  Skin: Skin is warm and dry. No rash noted. No erythema.  Psychiatric: He has a normal mood and affect. His behavior is normal. Thought content normal.   Recent Results (from the past 2160 hour(s))  COMPREHENSIVE METABOLIC PANEL     Status: Abnormal   Collection Time    10/29/12  2:31 PM      Result Value Range   Sodium 139  135 - 145 mEq/L   Potassium 4.2  3.5 - 5.1 mEq/L   Chloride 100  96 - 112 mEq/L   CO2 33 (*) 19 - 32 mEq/L   Glucose, Bld 88  70 - 99 mg/dL   BUN 14  6 - 23 mg/dL   Creatinine, Ser 1.0  0.4 - 1.5 mg/dL   Total Bilirubin 1.0  0.3 - 1.2 mg/dL   Alkaline Phosphatase 39  39 - 117 U/L   AST 25  0 - 37 U/L   ALT 25  0 - 53 U/L   Total Protein 7.3  6.0 - 8.3 g/dL   Albumin 4.1  3.5 - 5.2 g/dL   Calcium 9.4  8.4 - 81.1 mg/dL   GFR 91.47  >82.95 mL/min         Assessment & Plan:  Shoulder pain - persistent left shoulder pain.  Plan Refer to ortho, Dr. Ophelia Charter, for evaluation.

## 2012-10-29 NOTE — Assessment & Plan Note (Signed)
Current with ophthalmologist and not requiring any additional treatment since laser treatment in years past.   Plan  Per Ophthamologist

## 2012-10-29 NOTE — Assessment & Plan Note (Signed)
BP Readings from Last 3 Encounters:  10/29/12 146/92  08/20/11 122/84  10/26/09 130/92   Adequate control on present medications. Renal function and electrolytes are normal  Plan Continue present regimen

## 2012-10-29 NOTE — Assessment & Plan Note (Signed)
Interval history is unremarkable for any major illness, surgery, injury. Physical exam essentially normal. Reviewed previous years lipid panels - all normal and no need to repeat this year. General chemistry is normal. Colorectal cancer screening - will need to pull paper chart for last study. Discussed pros and cons of prostate cancer screening (USPHCTF recommendations reviewed and ACU April '13 recommendations) and he defers evaluation at this time with normal PSA '13 at 1.17. Immunization, tetanus, is up to date.  In summary- a very nice man who is medically stable at this time. He is going to work on exercise and weight management. He will return in 1 year or sooner as needed.

## 2012-10-29 NOTE — Assessment & Plan Note (Signed)
BMI = 34, down from 35 March of 13. Discussed the importance of weight management in regard to overall health.  Plan Diet management: smart food choices, PORTION SIZE CONTROL, regular exercise. Goal - to loose 1-2 lbs.month. Target weight - 220 lbs

## 2012-10-29 NOTE — Patient Instructions (Addendum)
Thanks for coming to see me.  Your exam is normal. Labs are ordered and results will be in my report and on MyChart.  Your health is your JOB!!!  A) Diet management: smart food choices, PORTION SIZE CONTROL, regular exercise. Goal - to loose 1-2 lbs.month. Target weight - 220 B) Exercise: minimum of 3 times a week: 30 minutes of exercise with a heart rate of 180% resting = 130, with 10 minutes warm up and 5 minute cool down.  Return in 1 year or sooner as needed.

## 2012-10-30 ENCOUNTER — Encounter: Payer: Self-pay | Admitting: Internal Medicine

## 2013-03-25 ENCOUNTER — Ambulatory Visit (INDEPENDENT_AMBULATORY_CARE_PROVIDER_SITE_OTHER): Payer: 59 | Admitting: Ophthalmology

## 2013-04-01 ENCOUNTER — Ambulatory Visit (INDEPENDENT_AMBULATORY_CARE_PROVIDER_SITE_OTHER): Payer: Self-pay | Admitting: Ophthalmology

## 2013-05-13 ENCOUNTER — Ambulatory Visit (INDEPENDENT_AMBULATORY_CARE_PROVIDER_SITE_OTHER): Payer: Self-pay | Admitting: Ophthalmology

## 2013-06-03 ENCOUNTER — Ambulatory Visit (INDEPENDENT_AMBULATORY_CARE_PROVIDER_SITE_OTHER): Payer: 59 | Admitting: Ophthalmology

## 2013-06-03 DIAGNOSIS — H3530 Unspecified macular degeneration: Secondary | ICD-10-CM

## 2013-06-03 DIAGNOSIS — H2510 Age-related nuclear cataract, unspecified eye: Secondary | ICD-10-CM

## 2013-06-03 DIAGNOSIS — H43819 Vitreous degeneration, unspecified eye: Secondary | ICD-10-CM

## 2013-10-13 ENCOUNTER — Encounter: Payer: Self-pay | Admitting: Internal Medicine

## 2013-10-13 ENCOUNTER — Ambulatory Visit (INDEPENDENT_AMBULATORY_CARE_PROVIDER_SITE_OTHER): Payer: 59 | Admitting: Internal Medicine

## 2013-10-13 ENCOUNTER — Other Ambulatory Visit (INDEPENDENT_AMBULATORY_CARE_PROVIDER_SITE_OTHER): Payer: 59

## 2013-10-13 VITALS — BP 142/98 | HR 73 | Resp 14 | Wt 265.0 lb

## 2013-10-13 DIAGNOSIS — R51 Headache: Secondary | ICD-10-CM

## 2013-10-13 DIAGNOSIS — I10 Essential (primary) hypertension: Secondary | ICD-10-CM

## 2013-10-13 LAB — BASIC METABOLIC PANEL
BUN: 21 mg/dL (ref 6–23)
CHLORIDE: 105 meq/L (ref 96–112)
CO2: 27 meq/L (ref 19–32)
CREATININE: 0.9 mg/dL (ref 0.4–1.5)
Calcium: 9.4 mg/dL (ref 8.4–10.5)
GFR: 99.82 mL/min (ref >60.00)
Glucose, Bld: 98 mg/dL (ref 70–99)
POTASSIUM: 3.7 meq/L (ref 3.5–5.1)
Sodium: 140 mEq/L (ref 135–145)

## 2013-10-13 MED ORDER — LISINOPRIL-HYDROCHLOROTHIAZIDE 20-25 MG PO TABS: 1.0000 | ORAL_TABLET | Freq: Every day | ORAL | Status: DC

## 2013-10-13 MED ORDER — TRAMADOL HCL 50 MG PO TABS: 50.0000 mg | ORAL_TABLET | Freq: Three times a day (TID) | ORAL | Status: DC | PRN

## 2013-10-13 NOTE — Progress Notes (Signed)
   Subjective:    Patient ID: Gregory [name], male    DOB: 12-07-1960, 53 y.o.   MRN: [id number]  HPI Blood pressure range / average : no monitor Compliant with anti hypertemsive medication; but mail order delayed. No meds X 10 days. No lightheadedness or other adverse medication effect described when taking BP meds.  A heart healthy /low salt diet is followed. Exercising minimally   Review of Systems    Significant headaches as of today diffusely; up to 9/10. No epistaxis, chest pain, palpitations, exertional dyspnea, claudication, paroxysmal nocturnal dyspnea, or edema absent.        Objective:   Physical Exam Appears healthy and well-nourished & in no acute distress  Fundi normal No carotid bruits are present.No neck pain distention present at 10 - 15 degrees. Thyroid normal to palpation  Heart rhythm and rate are normal with no gallop or murmur  Chest is clear with no increased work of breathing  There is no evidence of aortic aneurysm or renal artery bruits  Abdomen soft with no organomegaly or masses. No HJR  No clubbing, cyanosis or edema present.  Pedal pulses are intact   No ischemic skin changes are present . Fingernails healthy   Alert and oriented. Strength, tone, DTRs reflexes normal. Cranial nerve exam normal.           Assessment & Plan:  #1 HTN #2 headache off BP meds See orders

## 2013-10-13 NOTE — Patient Instructions (Signed)
Your next office appointment will be determined based upon review of your pending labs . Those instructions will be transmitted to you through My Chart  Minimal Blood Pressure Goal= AVERAGE < 140/90;  Ideal is an AVERAGE < 135/85. This AVERAGE should be calculated from @ least 5-7 BP readings taken @ different times of day on different days of week. You should not respond to isolated BP readings , but rather the AVERAGE for that week .Please bring your  blood pressure cuff to office visits to verify that it is reliable.It  can also be checked against the blood pressure device at the pharmacy. Finger or wrist cuffs are not dependable; an arm cuff is. Cardiovascular exercise, this can be as simple a program as walking, is recommended 30-45 minutes 3-4 times per week. If you're not exercising you should take 6-8 weeks to build up to this level.  

## 2013-10-13 NOTE — Progress Notes (Signed)
Pre visit review using our clinic review tool, if applicable. No additional management support is needed unless otherwise documented below in the visit note. 

## 2014-03-11 ENCOUNTER — Other Ambulatory Visit: Payer: Self-pay

## 2014-06-03 ENCOUNTER — Ambulatory Visit (INDEPENDENT_AMBULATORY_CARE_PROVIDER_SITE_OTHER): Payer: 59 | Admitting: Ophthalmology

## 2014-06-07 ENCOUNTER — Ambulatory Visit (INDEPENDENT_AMBULATORY_CARE_PROVIDER_SITE_OTHER): Payer: 59 | Admitting: Ophthalmology

## 2014-06-07 DIAGNOSIS — H2513 Age-related nuclear cataract, bilateral: Secondary | ICD-10-CM

## 2014-06-07 DIAGNOSIS — H43813 Vitreous degeneration, bilateral: Secondary | ICD-10-CM

## 2014-06-07 DIAGNOSIS — H3531 Nonexudative age-related macular degeneration: Secondary | ICD-10-CM

## 2014-10-27 NOTE — H&P (Signed)
  Gregory [name] is an 54 y.o. male.    Chief Complaint: left shoulder pain  HPI: Pt is a 54 y.o. male complaining of left shoulder pain for multiple years. Pain had continually increased since the beginning. X-rays in the clinic show end-stage arthritic changes of the left shoulder. Pt has tried various conservative treatments which have failed to alleviate their symptoms, including injections and therapy. Various options are discussed with the patient. Risks, benefits and expectations were discussed with the patient. Patient understand the risks, benefits and expectations and wishes to proceed with surgery.   PCP:  Adella Hare, MD  D/C Plans: Home  PMH: Past Medical History  Diagnosis Date  . Allergy   . Hypertension   . Anxiety   . Plantar warts   . Hypertriglyceridemia   . History of gastrointestinal hemorrhage     PSH: Past Surgical History  Procedure Laterality Date  . Tonsillectomy    . Vasectomy    . Sebaceous cyst excision  1985  . Orif fx fibula    . Colonoscopy  03/22/2005    Social History:  reports that he has never smoked. He does not have any smokeless tobacco history on file. He reports that he drinks alcohol. He reports that he does not use illicit drugs.  Allergies:  No Known Allergies  Medications: No current facility-administered medications for this encounter.   Current Outpatient Prescriptions  Medication Sig Dispense Refill  . lisinopril-hydrochlorothiazide (PRINZIDE,ZESTORETIC) 20-25 MG per tablet Take 1 tablet by mouth daily. 90 tablet 1  . PARoxetine (PAXIL-CR) 12.5 MG 24 hr tablet Take 1 tablet (12.5 mg total) by mouth every morning. 90 tablet 3  . traMADol (ULTRAM) 50 MG tablet Take 1 tablet (50 mg total) by mouth every 8 (eight) hours as needed. 30 tablet 0    No results found for this or any previous visit (from the past 48 hour(s)). No results found.  ROS: Pain with rom of the left upper extremity  Physical Exam:  Alert and  oriented 55 y.o. male in no acute distress Cranial nerves 2-12 intact Cervical spine: full rom with no tenderness, nv intact distally Chest: active breath sounds bilaterally, no wheeze rhonchi or rales Heart: regular rate and rhythm, no murmur Abd: non tender non distended with active bowel sounds Hip is stable with rom  Left shoulder with moderate crepitus with rom nv intact distally No rashes or edema ER and IR strength 4.5/5   Assessment/Plan Assessment: left shoulder endstage osteoarthritis  Plan: Patient will undergo a left total shoulder by Dr. Veverly Fells at [facility]. Risks benefits and expectations were discussed with the patient. Patient understand risks, benefits and expectations and wishes to proceed.

## 2014-11-02 ENCOUNTER — Other Ambulatory Visit (HOSPITAL_COMMUNITY): Payer: Self-pay

## 2014-11-02 ENCOUNTER — Encounter (HOSPITAL_COMMUNITY): Payer: Self-pay

## 2014-11-02 ENCOUNTER — Encounter (HOSPITAL_COMMUNITY)
Admission: RE | Admit: 2014-11-02 | Discharge: 2014-11-02 | Disposition: A | Discharge status: Home / Self Care | Payer: 59 | Source: Ambulatory Visit | Attending: Orthopedic Surgery | Admitting: Orthopedic Surgery

## 2014-11-02 HISTORY — DX: Nausea with vomiting, unspecified: R11.2

## 2014-11-02 HISTORY — DX: Other specified postprocedural states: Z98.890

## 2014-11-02 LAB — CBC
HCT: 39 % (ref 39.0–52.0)
HEMOGLOBIN: 13 g/dL (ref 13.0–17.0)
MCH: 31 pg (ref 26.0–34.0)
MCHC: 33.3 g/dL (ref 30.0–36.0)
MCV: 92.9 fL (ref 78.0–100.0)
Platelets: 330 10*3/uL (ref 150–400)
RBC: 4.2 MIL/uL — ABNORMAL LOW (ref 4.22–5.81)
RDW: 12.7 % (ref 11.5–15.5)
WBC: 7.7 10*3/uL (ref 4.0–10.5)

## 2014-11-02 LAB — BASIC METABOLIC PANEL
ANION GAP: 8 (ref 5–15)
BUN: 15 mg/dL (ref 6–20)
CALCIUM: 9.1 mg/dL (ref 8.9–10.3)
CHLORIDE: 101 mmol/L (ref 101–111)
CO2: 28 mmol/L (ref 22–32)
Creatinine, Ser: 0.91 mg/dL (ref 0.61–1.24)
GFR calc Af Amer: >60 mL/min (ref >60)
GFR calc non Af Amer: >60 mL/min (ref >60)
Glucose, Bld: 111 mg/dL — ABNORMAL HIGH (ref 65–99)
Potassium: 3.6 mmol/L (ref 3.5–5.1)
Sodium: 137 mmol/L (ref 135–145)

## 2014-11-02 LAB — SURGICAL PCR SCREEN
MRSA, PCR: NEGATIVE
STAPHYLOCOCCUS AUREUS: NEGATIVE

## 2014-11-02 MED ORDER — CHLORHEXIDINE GLUCONATE 4 % EX LIQD: 60.0000 mL | Freq: Once | CUTANEOUS | Status: DC

## 2014-11-02 NOTE — Pre-Procedure Instructions (Signed)
    Gregory [name]  11/02/2014      [facility] - [place], [place] [address] [place] [address] Phone: [phone] Fax: [place], [place] [address] Phone: [phone] Fax: [phone]    Your procedure is scheduled on November 11, 2014.  Report to [facility] Admitting at 5:30 A.M.  Call this number if you have problems the morning of surgery:  [phone]   Remember:  Do not eat food or drink liquids after midnight.  Take these medicines the morning of surgery with A SIP OF WATER    STOP ASPIRIN, ADVIL, IBUPROFEN, HERBAL SUPPLEMENTS 7 DAYS PRIOR TO SURGERY   Do not wear jewelry.  Do not wear lotions, powders, or colognes.  You may NOT wear deodorant.  Men may shave face and neck.  Do not bring valuables to the hospital.  [facility] is not responsible for any belongings or valuables.  Contacts, dentures or bridgework may not be worn into surgery.  Leave your suitcase in the car.  After surgery it may be brought to your room.  For patients admitted to the hospital, discharge time will be determined by your treatment team.  Patients discharged the day of surgery will not be allowed to drive home.   Name and phone number of your driver:    Special instructions:  "PREPARING FOR SURGERY"  Please read over the following fact sheets that you were given. Pain Booklet, Coughing and Deep Breathing and Surgical Site Infection Prevention

## 2014-11-03 NOTE — Progress Notes (Addendum)
Anesthesia Chart Review:  Pt is 54 year old male scheduled for L total shoulder arthroplasty on 11/11/2014 with Dr. Veverly Fells.   PMH includes: HTN, hypertriglyceridemia, hx of GI hemorrhage. Never smoker. BMI 33.   Preoperative labs reviewed.    EKG 11/02/2014: Sinus rhythm with occasional PVCs. Cannot rule out Anterior infarct, age undetermined. PVCs are new, otherwise appears unchanged when compared to EKG tracing from 10/26/2009.   If no changes, I anticipate pt can proceed with surgery as scheduled.   Willeen Cass, FNP-BC [facility] Short Stay Surgical Center/Anesthesiology Phone: [phone] 11/03/2014 3:02 PM  Addendum: Patient requested a phone call to discuss anesthesia concerns.  He reports he is prone to post-operative N/V.  His brother-in-law is a Psychiatric nurse in [place] and has had great success with using Emend for perioperative N/V.  Patient inquired if an anesthesiologist could call in a prescription for Emend one pill today, one tomorrow, and one on POD#1.  I did discuss with several of our anesthesiologist.  They do not feel comfortable prescribing medication to a patient they have not yet examined.  In addition, I spoke with a [facility] pharmacist and staff at his home pharmacy ([facility] [place]), and Baldomero Lamy is not carried at either place.  I have notified Mr. Dever.  He requested I contact Dr. Veverly Fells' PA Merla Riches to explain the situation which I did.  Brad plans to call at speak with him.  I assured patient our anesthesia team would work with him the best we can to help prevent post-operative N/V and encouraged him to discuss their plan for him during his evaluation tomorrow morning.  Kolbee Hugh [facility] Short Stay Center/Anesthesiology Phone [phone] 11/10/2014 10:10 AM

## 2014-11-10 MED ORDER — CEFAZOLIN SODIUM-DEXTROSE 2-3 GM-% IV SOLR
2.0000 g | INTRAVENOUS | Status: AC
Start: 2014-11-11 — End: 2014-11-11
  Administered 2014-11-11: 2 g via INTRAVENOUS
  Filled 2014-11-10: qty 50

## 2014-11-11 ENCOUNTER — Inpatient Hospital Stay (HOSPITAL_COMMUNITY)
Admission: RE | Admit: 2014-11-11 | Discharge: 2014-11-12 | DRG: 483 | Disposition: A | Discharge status: Home / Self Care | Payer: 59 | Source: Ambulatory Visit | Attending: Orthopedic Surgery | Admitting: Orthopedic Surgery

## 2014-11-11 ENCOUNTER — Inpatient Hospital Stay (HOSPITAL_COMMUNITY): Payer: 59 | Admitting: Anesthesiology

## 2014-11-11 ENCOUNTER — Encounter (HOSPITAL_COMMUNITY)
Admission: RE | Disposition: A | Discharge status: Home / Self Care | Payer: 59 | Source: Ambulatory Visit | Attending: Orthopedic Surgery

## 2014-11-11 ENCOUNTER — Inpatient Hospital Stay (HOSPITAL_COMMUNITY): Payer: 59 | Admitting: Emergency Medicine

## 2014-11-11 ENCOUNTER — Inpatient Hospital Stay (HOSPITAL_COMMUNITY): Payer: 59

## 2014-11-11 ENCOUNTER — Encounter (HOSPITAL_COMMUNITY): Payer: Self-pay

## 2014-11-11 DIAGNOSIS — I10 Essential (primary) hypertension: Secondary | ICD-10-CM | POA: Diagnosis present

## 2014-11-11 DIAGNOSIS — M19012 Primary osteoarthritis, left shoulder: Secondary | ICD-10-CM | POA: Diagnosis present

## 2014-11-11 DIAGNOSIS — F419 Anxiety disorder, unspecified: Secondary | ICD-10-CM | POA: Diagnosis present

## 2014-11-11 DIAGNOSIS — Z96619 Presence of unspecified artificial shoulder joint: Secondary | ICD-10-CM

## 2014-11-11 DIAGNOSIS — M25512 Pain in left shoulder: Secondary | ICD-10-CM | POA: Diagnosis present

## 2014-11-11 DIAGNOSIS — E781 Pure hyperglyceridemia: Secondary | ICD-10-CM | POA: Diagnosis present

## 2014-11-11 DIAGNOSIS — Z96612 Presence of left artificial shoulder joint: Secondary | ICD-10-CM

## 2014-11-11 HISTORY — PX: TOTAL SHOULDER ARTHROPLASTY: SHX126

## 2014-11-11 SURGERY — ARTHROPLASTY, SHOULDER, TOTAL
Anesthesia: Regional | Site: Shoulder | Laterality: Left

## 2014-11-11 MED ORDER — THROMBIN 20000 UNITS EX KIT
PACK | CUTANEOUS | Status: DC | PRN
  Administered 2014-11-11: 5 mL via TOPICAL

## 2014-11-11 MED ORDER — PAROXETINE HCL ER 12.5 MG PO TB24
12.5000 mg | ORAL_TABLET | ORAL | Status: DC
  Administered 2014-11-12: 12.5 mg via ORAL
  Filled 2014-11-11 (×2): qty 1

## 2014-11-11 MED ORDER — FENTANYL CITRATE (PF) 250 MCG/5ML IJ SOLN
INTRAMUSCULAR | Status: AC
  Filled 2014-11-11: qty 5

## 2014-11-11 MED ORDER — HYDROMORPHONE HCL 1 MG/ML IJ SOLN: 0.2500 mg | INTRAMUSCULAR | Status: DC | PRN

## 2014-11-11 MED ORDER — ONDANSETRON HCL 4 MG/2ML IJ SOLN: 4.0000 mg | Freq: Four times a day (QID) | INTRAMUSCULAR | Status: DC | PRN

## 2014-11-11 MED ORDER — 0.9 % SODIUM CHLORIDE (POUR BTL) OPTIME
TOPICAL | Status: DC | PRN
  Administered 2014-11-11: 1000 mL

## 2014-11-11 MED ORDER — PROPOFOL 10 MG/ML IV BOLUS
INTRAVENOUS | Status: DC | PRN
  Administered 2014-11-11: 160 mg via INTRAVENOUS

## 2014-11-11 MED ORDER — BUPIVACAINE-EPINEPHRINE 0.25% -1:200000 IJ SOLN
INTRAMUSCULAR | Status: DC | PRN
  Administered 2014-11-11: 9 mL

## 2014-11-11 MED ORDER — PHENOL 1.4 % MT LIQD: 1.0000 | OROMUCOSAL | Status: DC | PRN

## 2014-11-11 MED ORDER — TRAMADOL HCL 50 MG PO TABS: 50.0000 mg | ORAL_TABLET | Freq: Four times a day (QID) | ORAL | Status: DC | PRN

## 2014-11-11 MED ORDER — PROPOFOL 10 MG/ML IV BOLUS
INTRAVENOUS | Status: AC
  Filled 2014-11-11: qty 20

## 2014-11-11 MED ORDER — LIDOCAINE HCL (CARDIAC) 20 MG/ML IV SOLN
INTRAVENOUS | Status: DC | PRN
  Administered 2014-11-11: 100 mg via INTRAVENOUS

## 2014-11-11 MED ORDER — ONDANSETRON HCL 4 MG/2ML IJ SOLN
INTRAMUSCULAR | Status: DC | PRN
  Administered 2014-11-11: 4 mg via INTRAVENOUS

## 2014-11-11 MED ORDER — ROCURONIUM BROMIDE 50 MG/5ML IV SOLN
INTRAVENOUS | Status: AC
  Filled 2014-11-11: qty 1

## 2014-11-11 MED ORDER — LIDOCAINE HCL (CARDIAC) 20 MG/ML IV SOLN
INTRAVENOUS | Status: AC
  Filled 2014-11-11: qty 5

## 2014-11-11 MED ORDER — THROMBIN 5000 UNITS EX SOLR
CUTANEOUS | Status: AC
  Filled 2014-11-11: qty 5000

## 2014-11-11 MED ORDER — BUPIVACAINE-EPINEPHRINE (PF) 0.5% -1:200000 IJ SOLN
INTRAMUSCULAR | Status: DC | PRN
  Administered 2014-11-11: 20 mL via PERINEURAL

## 2014-11-11 MED ORDER — ACETAMINOPHEN 650 MG RE SUPP: 650.0000 mg | Freq: Four times a day (QID) | RECTAL | Status: DC | PRN

## 2014-11-11 MED ORDER — METOCLOPRAMIDE HCL 5 MG PO TABS: 5.0000 mg | ORAL_TABLET | Freq: Three times a day (TID) | ORAL | Status: DC | PRN

## 2014-11-11 MED ORDER — CEFAZOLIN SODIUM-DEXTROSE 2-3 GM-% IV SOLR
2.0000 g | Freq: Four times a day (QID) | INTRAVENOUS | Status: AC
  Administered 2014-11-11 – 2014-11-12 (×3): 2 g via INTRAVENOUS
  Filled 2014-11-11 (×3): qty 50

## 2014-11-11 MED ORDER — MENTHOL 3 MG MT LOZG: 1.0000 | LOZENGE | OROMUCOSAL | Status: DC | PRN

## 2014-11-11 MED ORDER — METOCLOPRAMIDE HCL 5 MG/ML IJ SOLN: 5.0000 mg | Freq: Three times a day (TID) | INTRAMUSCULAR | Status: DC | PRN

## 2014-11-11 MED ORDER — LACTATED RINGERS IV SOLN
INTRAVENOUS | Status: DC | PRN
  Administered 2014-11-11 (×2): via INTRAVENOUS

## 2014-11-11 MED ORDER — MEPERIDINE HCL 25 MG/ML IJ SOLN: 6.2500 mg | INTRAMUSCULAR | Status: DC | PRN

## 2014-11-11 MED ORDER — NEOSTIGMINE METHYLSULFATE 10 MG/10ML IV SOLN
INTRAVENOUS | Status: DC | PRN
  Administered 2014-11-11: 5 mg via INTRAVENOUS

## 2014-11-11 MED ORDER — SCOPOLAMINE 1 MG/3DAYS TD PT72
MEDICATED_PATCH | TRANSDERMAL | Status: DC | PRN
  Administered 2014-11-11: 1 via TRANSDERMAL

## 2014-11-11 MED ORDER — LISINOPRIL 20 MG PO TABS
20.0000 mg | ORAL_TABLET | Freq: Every day | ORAL | Status: DC
  Administered 2014-11-12: 20 mg via ORAL
  Filled 2014-11-11: qty 1

## 2014-11-11 MED ORDER — ONDANSETRON HCL 4 MG/2ML IJ SOLN
INTRAMUSCULAR | Status: AC
  Filled 2014-11-11: qty 2

## 2014-11-11 MED ORDER — SODIUM CHLORIDE 0.9 % IV SOLN
INTRAVENOUS | Status: DC
  Administered 2014-11-11: 21:00:00 via INTRAVENOUS

## 2014-11-11 MED ORDER — MIDAZOLAM HCL 5 MG/5ML IJ SOLN
INTRAMUSCULAR | Status: DC | PRN
  Administered 2014-11-11: 2 mg via INTRAVENOUS

## 2014-11-11 MED ORDER — PHENYLEPHRINE HCL 10 MG/ML IJ SOLN
10.0000 mg | INTRAVENOUS | Status: DC | PRN
  Administered 2014-11-11: 25 ug/min via INTRAVENOUS

## 2014-11-11 MED ORDER — HYDROMORPHONE HCL 1 MG/ML IJ SOLN: 1.0000 mg | INTRAMUSCULAR | Status: DC | PRN

## 2014-11-11 MED ORDER — SODIUM CHLORIDE 0.9 % IR SOLN
Status: DC | PRN
  Administered 2014-11-11: 1000 mL

## 2014-11-11 MED ORDER — METHOCARBAMOL 500 MG PO TABS: 500.0000 mg | ORAL_TABLET | Freq: Three times a day (TID) | ORAL | Status: DC | PRN

## 2014-11-11 MED ORDER — DEXTROSE 5 % IV SOLN
500.0000 mg | Freq: Four times a day (QID) | INTRAVENOUS | Status: DC | PRN
  Filled 2014-11-11: qty 5

## 2014-11-11 MED ORDER — LISINOPRIL-HYDROCHLOROTHIAZIDE 20-25 MG PO TABS: 1.0000 | ORAL_TABLET | Freq: Every day | ORAL | Status: DC

## 2014-11-11 MED ORDER — ACETAMINOPHEN 325 MG PO TABS: 650.0000 mg | ORAL_TABLET | Freq: Four times a day (QID) | ORAL | Status: DC | PRN

## 2014-11-11 MED ORDER — ONDANSETRON HCL 4 MG PO TABS: 4.0000 mg | ORAL_TABLET | Freq: Four times a day (QID) | ORAL | Status: DC | PRN

## 2014-11-11 MED ORDER — POLYETHYLENE GLYCOL 3350 17 G PO PACK
17.0000 g | PACK | Freq: Every day | ORAL | Status: DC | PRN
Start: 2014-11-11 — End: 2014-11-12

## 2014-11-11 MED ORDER — MIDAZOLAM HCL 2 MG/2ML IJ SOLN
INTRAMUSCULAR | Status: AC
  Filled 2014-11-11: qty 2

## 2014-11-11 MED ORDER — OXYCODONE HCL 5 MG PO TABS
5.0000 mg | ORAL_TABLET | ORAL | Status: DC | PRN
  Administered 2014-11-11 – 2014-11-12 (×5): 10 mg via ORAL
  Filled 2014-11-11 (×5): qty 2

## 2014-11-11 MED ORDER — GLYCOPYRROLATE 0.2 MG/ML IJ SOLN
INTRAMUSCULAR | Status: DC | PRN
  Administered 2014-11-11: .8 mg via INTRAVENOUS

## 2014-11-11 MED ORDER — BUPIVACAINE-EPINEPHRINE (PF) 0.25% -1:200000 IJ SOLN
INTRAMUSCULAR | Status: AC
  Filled 2014-11-11: qty 30

## 2014-11-11 MED ORDER — ROCURONIUM BROMIDE 100 MG/10ML IV SOLN
INTRAVENOUS | Status: DC | PRN
  Administered 2014-11-11: 40 mg via INTRAVENOUS

## 2014-11-11 MED ORDER — HYDROCHLOROTHIAZIDE 25 MG PO TABS
25.0000 mg | ORAL_TABLET | Freq: Every day | ORAL | Status: DC
  Administered 2014-11-12: 25 mg via ORAL
  Filled 2014-11-11: qty 1

## 2014-11-11 MED ORDER — FENTANYL CITRATE (PF) 100 MCG/2ML IJ SOLN
INTRAMUSCULAR | Status: DC | PRN
  Administered 2014-11-11 (×4): 50 ug via INTRAVENOUS

## 2014-11-11 MED ORDER — DEXAMETHASONE SODIUM PHOSPHATE 4 MG/ML IJ SOLN
INTRAMUSCULAR | Status: DC | PRN
  Administered 2014-11-11: 10 mg via INTRAVENOUS

## 2014-11-11 MED ORDER — OXYCODONE-ACETAMINOPHEN 5-325 MG PO TABS: 1.0000 | ORAL_TABLET | ORAL | Status: DC | PRN

## 2014-11-11 MED ORDER — BISACODYL 10 MG RE SUPP: 10.0000 mg | Freq: Every day | RECTAL | Status: DC | PRN

## 2014-11-11 MED ORDER — PROMETHAZINE HCL 25 MG/ML IJ SOLN
6.2500 mg | INTRAMUSCULAR | Status: DC | PRN
Start: 2014-11-11 — End: 2014-11-11

## 2014-11-11 MED ORDER — METHOCARBAMOL 500 MG PO TABS
500.0000 mg | ORAL_TABLET | Freq: Four times a day (QID) | ORAL | Status: DC | PRN
  Administered 2014-11-12: 500 mg via ORAL
  Filled 2014-11-11: qty 1

## 2014-11-11 SURGICAL SUPPLY — 72 items
BLADE SAW SAG 73X25 THK (BLADE) ×1
BLADE SAW SGTL 73X25 THK (BLADE) ×1 IMPLANT
BUR SURG 4X8 MED (BURR) IMPLANT
BURR SURG 4X8 MED (BURR)
CAPT SHLDR TOTAL 2 ×1 IMPLANT
CEMENT BONE DEPUY (Cement) ×2 IMPLANT
CLSR STERI-STRIP ANTIMIC 1/2X4 (GAUZE/BANDAGES/DRESSINGS) ×1 IMPLANT
COVER SURGICAL LIGHT HANDLE (MISCELLANEOUS) ×2 IMPLANT
DRAPE IMP U-DRAPE 54X76 (DRAPES) ×2 IMPLANT
DRAPE INCISE IOBAN 66X45 STRL (DRAPES) ×6 IMPLANT
DRAPE U-SHAPE 47X51 STRL (DRAPES) ×2 IMPLANT
DRAPE X-RAY CASS 24X20 (DRAPES) IMPLANT
DRILL BIT 5/64 (BIT) ×2 IMPLANT
DRSG ADAPTIC 3X8 NADH LF (GAUZE/BANDAGES/DRESSINGS) ×2 IMPLANT
DRSG PAD ABDOMINAL 8X10 ST (GAUZE/BANDAGES/DRESSINGS) ×3 IMPLANT
DURAPREP 26ML APPLICATOR (WOUND CARE) ×2 IMPLANT
ELECT BLADE 4.0 EZ CLEAN MEGAD (MISCELLANEOUS) ×2
ELECT NDL TIP 2.8 STRL (NEEDLE) ×1 IMPLANT
ELECT NEEDLE TIP 2.8 STRL (NEEDLE) ×2 IMPLANT
ELECT REM PT RETURN 9FT ADLT (ELECTROSURGICAL) ×2
ELECTRODE BLDE 4.0 EZ CLN MEGD (MISCELLANEOUS) ×1 IMPLANT
ELECTRODE REM PT RTRN 9FT ADLT (ELECTROSURGICAL) ×1 IMPLANT
GAUZE SPONGE 4X4 12PLY STRL (GAUZE/BANDAGES/DRESSINGS) ×2 IMPLANT
GLOVE BIOGEL PI ORTHO PRO 7.5 (GLOVE) ×1
GLOVE BIOGEL PI ORTHO PRO SZ8 (GLOVE) ×1
GLOVE ORTHO TXT STRL SZ7.5 (GLOVE) ×2 IMPLANT
GLOVE PI ORTHO PRO STRL 7.5 (GLOVE) ×1 IMPLANT
GLOVE PI ORTHO PRO STRL SZ8 (GLOVE) ×1 IMPLANT
GLOVE SURG ORTHO 8.5 STRL (GLOVE) ×4 IMPLANT
GOWN STRL REUS W/ TWL XL LVL3 (GOWN DISPOSABLE) ×3 IMPLANT
GOWN STRL REUS W/TWL XL LVL3 (GOWN DISPOSABLE) ×6
HANDPIECE INTERPULSE COAX TIP (DISPOSABLE)
KIT BASIN OR (CUSTOM PROCEDURE TRAY) ×2 IMPLANT
KIT ROOM TURNOVER OR (KITS) ×2 IMPLANT
MANIFOLD NEPTUNE II (INSTRUMENTS) ×2 IMPLANT
NDL 1/2 CIR MAYO (NEEDLE) ×1 IMPLANT
NDL HYPO 25GX1X1/2 BEV (NEEDLE) ×1 IMPLANT
NDL SUT 6 .5 CRC .975X.05 MAYO (NEEDLE) ×1 IMPLANT
NEEDLE 1/2 CIR MAYO (NEEDLE) ×2 IMPLANT
NEEDLE HYPO 25GX1X1/2 BEV (NEEDLE) ×2 IMPLANT
NEEDLE MAYO TAPER (NEEDLE) ×2
NS IRRIG 1000ML POUR BTL (IV SOLUTION) ×2 IMPLANT
PACK SHOULDER (CUSTOM PROCEDURE TRAY) ×2 IMPLANT
PACK UNIVERSAL I (CUSTOM PROCEDURE TRAY) ×2 IMPLANT
PAD ARMBOARD 7.5X6 YLW CONV (MISCELLANEOUS) ×4 IMPLANT
PIN METAGLENE 2.5 (PIN) IMPLANT
SET HNDPC FAN SPRY TIP SCT (DISPOSABLE) IMPLANT
SLING ARM IMMOBILIZER LRG (SOFTGOODS) ×2 IMPLANT
SLING ARM IMMOBILIZER MED (SOFTGOODS) IMPLANT
SMARTMIX MINI TOWER (MISCELLANEOUS) ×2
SPONGE GAUZE 4X4 12PLY STER LF (GAUZE/BANDAGES/DRESSINGS) ×1 IMPLANT
SPONGE LAP 18X18 X RAY DECT (DISPOSABLE) ×2 IMPLANT
SPONGE LAP 4X18 X RAY DECT (DISPOSABLE) ×2 IMPLANT
SPONGE SURGIFOAM ABS GEL SZ50 (HEMOSTASIS) IMPLANT
STRIP CLOSURE SKIN 1/2X4 (GAUZE/BANDAGES/DRESSINGS) ×2 IMPLANT
SUCTION FRAZIER TIP 10 FR DISP (SUCTIONS) ×2 IMPLANT
SUT FIBERWIRE #2 38 T-5 BLUE (SUTURE) ×4
SUT MNCRL AB 4-0 PS2 18 (SUTURE) ×2 IMPLANT
SUT VIC AB 0 CT1 27 (SUTURE) ×2
SUT VIC AB 0 CT1 27XBRD ANBCTR (SUTURE) ×1 IMPLANT
SUT VIC AB 2-0 CT1 27 (SUTURE) ×2
SUT VIC AB 2-0 CT1 TAPERPNT 27 (SUTURE) ×1 IMPLANT
SUT VICRYL AB 2 0 TIES (SUTURE) ×2 IMPLANT
SUTURE FIBERWR #2 38 T-5 BLUE (SUTURE) ×2 IMPLANT
SYR CONTROL 10ML LL (SYRINGE) ×2 IMPLANT
TAPE CLOTH SURG 6X10 WHT LF (GAUZE/BANDAGES/DRESSINGS) ×1 IMPLANT
TOWEL OR 17X24 6PK STRL BLUE (TOWEL DISPOSABLE) ×2 IMPLANT
TOWEL OR 17X26 10 PK STRL BLUE (TOWEL DISPOSABLE) ×2 IMPLANT
TOWER SMARTMIX MINI (MISCELLANEOUS) ×1 IMPLANT
TRAY FOLEY CATH 16FRSI W/METER (SET/KITS/TRAYS/PACK) ×2 IMPLANT
WATER STERILE IRR 1000ML POUR (IV SOLUTION) ×2 IMPLANT
YANKAUER SUCT BULB TIP NO VENT (SUCTIONS) IMPLANT

## 2014-11-11 NOTE — Interval H&P Note (Signed)
History and Physical Interval Note:  11/11/2014 7:26 AM  Gregory [name]  has presented today for surgery, with the diagnosis of left shoulder osteroarthitis  The various methods of treatment have been discussed with the patient and family. After consideration of risks, benefits and other options for treatment, the patient has consented to  Procedure(s): LEFT TOTAL SHOULDER ARTHROPLASTY (Left) as a surgical intervention .  The patient's history has been reviewed, patient examined, no change in status, stable for surgery.  I have reviewed the patient's chart and labs.  Questions were answered to the patient's satisfaction.     Taronda Comacho,STEVEN R

## 2014-11-11 NOTE — Anesthesia Preprocedure Evaluation (Signed)
Anesthesia Evaluation  Patient identified by MRN, date of birth, ID band Patient awake    Reviewed: Allergy & Precautions, NPO status , Patient's Chart, lab work & pertinent test results  History of Anesthesia Complications (+) PONV and history of anesthetic complications  Airway Mallampati: II  TM Distance: >3 FB Neck ROM: Full    Dental no notable dental hx.    Pulmonary neg pulmonary ROS,  breath sounds clear to auscultation  Pulmonary exam normal       Cardiovascular hypertension, Pt. on medications Normal cardiovascular examRhythm:Regular Rate:Normal     Neuro/Psych PSYCHIATRIC DISORDERS Anxiety negative neurological ROS     GI/Hepatic negative GI ROS, Neg liver ROS,   Endo/Other  negative endocrine ROS  Renal/GU negative Renal ROS     Musculoskeletal negative musculoskeletal ROS (+)   Abdominal   Peds  Hematology negative hematology ROS (+)   Anesthesia Other Findings   Reproductive/Obstetrics negative OB ROS                             Anesthesia Physical Anesthesia Plan  ASA: II  Anesthesia Plan: General and Regional   Post-op Pain Management:    Induction: Intravenous  Airway Management Planned: Oral ETT  Additional Equipment:   Intra-op Plan:   Post-operative Plan: Extubation in OR  Informed Consent: I have reviewed the patients History and Physical, chart, labs and discussed the procedure including the risks, benefits and alternatives for the proposed anesthesia with the patient or authorized representative who has indicated his/her understanding and acceptance.   Dental advisory given  Plan Discussed with: CRNA  Anesthesia Plan Comments:         Anesthesia Quick Evaluation

## 2014-11-11 NOTE — Anesthesia Procedure Notes (Addendum)
Anesthesia Regional Block:  Interscalene brachial plexus block  Pre-Anesthetic Checklist: ,, timeout performed, Correct Patient, Correct Site, Correct Laterality, Correct Procedure, Correct Position, site marked, Risks and benefits discussed, Surgical consent,  Pre-op evaluation,  Post-op pain management  Laterality: Left  Prep: chloraprep       Needles:  Injection technique: Single-shot  Needle Type: Stimulator Needle - 40     Needle Length: 4cm 4 cm Needle Gauge: 22 and 22 G    Additional Needles:  Procedures: ultrasound guided (picture in chart) Interscalene brachial plexus block Narrative:  Injection made incrementally with aspirations every 5 mL.  Performed by: Personally  Anesthesiologist: Nolon Nations  Additional Notes: BP cuff, EKG monitors applied. Sedation begun. Nerve location verified with U/S. Anesthetic injected incrementally, slowly , and after neg aspirations under direct u/s guidance. Good perineural spread. Tolerated well.   Procedure Name: Intubation Date/Time: 11/11/2014 7:50 AM Performed by: Clearnce Sorrel Pre-anesthesia Checklist: Patient identified, Timeout performed, Emergency Drugs available, Suction available and Patient being monitored Patient Re-evaluated:Patient Re-evaluated prior to inductionOxygen Delivery Method: Circle system utilized Preoxygenation: Pre-oxygenation with 100% oxygen Intubation Type: IV induction Ventilation: Mask ventilation without difficulty Laryngoscope Size: Mac and 4 Grade View: Grade II Tube type: Oral Tube size: 7.5 mm Number of attempts: 1 Placement Confirmation: ETT inserted through vocal cords under direct vision,  breath sounds checked- equal and bilateral and positive ETCO2 Secured at: 23 cm Tube secured with: Tape Dental Injury: Teeth and Oropharynx as per pre-operative assessment

## 2014-11-11 NOTE — Discharge Instructions (Signed)
No load bearing with the left shoulder.  Gentle Activities of Daily Living are ok - ie hand to mouth.,  AROM of the wrist and elbow is ok.  AAROM of the shoulder is preferred.   Lap slides, gentle pendulums, internal rotation to abdomen and external rotation to neutral.  Active assist forward elevation to 90 degrees, elbow level with chin.  No push, pull, lift.  Keep the incision clean and dry and covered for one week, then ok to get it wet in the shower.  Ice the shoulder constantly.  Follow up in two weeks with Dr Veverly Fells  [phone]  Fo

## 2014-11-11 NOTE — Transfer of Care (Signed)
Immediate Anesthesia Transfer of Care Note  Patient: Gregory [name]  Procedure(s) Performed: Procedure(s): LEFT TOTAL SHOULDER ARTHROPLASTY (Left)  Patient Location: PACU  Anesthesia Type:General  Level of Consciousness: awake  Airway & Oxygen Therapy: Patient Spontanous Breathing and Patient connected to face mask oxygen  Post-op Assessment: Report given to RN and Post -op Vital signs reviewed and stable  Post vital signs: Reviewed and stable  Last Vitals:  Filed Vitals:   11/11/14 0604  BP: 125/71  Pulse: 79  Temp: 37 C  Resp: 18    Complications: No apparent anesthesia complications

## 2014-11-11 NOTE — Brief Op Note (Signed)
11/11/2014  11:06 AM  PATIENT:  Gregory [name]  54 y.o. male  PRE-OPERATIVE DIAGNOSIS:  left shoulder osteoarthritis, end stage  POST-OPERATIVE DIAGNOSIS:  left shoulder osteoarthritis, end stage  PROCEDURE:  Procedure(s): LEFT TOTAL SHOULDER ARTHROPLASTY (Left) DePuy Global Unite system with APG glenoid  SURGEON:  Surgeon(s) and Role:    * Netta Cedars, MD - Primary  PHYSICIAN ASSISTANT:   ASSISTANTS: Ventura Bruns, PA-C   ANESTHESIA:   regional and general  EBL:  Total I/O In: 1300 [I.V.:1300] Out: 200 [Blood:200]  BLOOD ADMINISTERED:none  DRAINS: none   LOCAL MEDICATIONS USED:  MARCAINE     SPECIMEN:  No Specimen  DISPOSITION OF SPECIMEN:  N/A  COUNTS:  YES  TOURNIQUET:  * No tourniquets in log *  DICTATION: .Other Dictation: Dictation Number [id number]  PLAN OF CARE: Admit to inpatient   PATIENT DISPOSITION:  PACU - hemodynamically stable.   Delay start of Pharmacological VTE agent (>24hrs) due to surgical blood loss or risk of bleeding: yes

## 2014-11-12 LAB — BASIC METABOLIC PANEL
Anion gap: 7 (ref 5–15)
BUN: 15 mg/dL (ref 6–20)
CHLORIDE: 101 mmol/L (ref 101–111)
CO2: 28 mmol/L (ref 22–32)
Calcium: 8.4 mg/dL — ABNORMAL LOW (ref 8.9–10.3)
Creatinine, Ser: 0.79 mg/dL (ref 0.61–1.24)
GFR calc Af Amer: >60 mL/min (ref >60)
Glucose, Bld: 118 mg/dL — ABNORMAL HIGH (ref 65–99)
Potassium: 4 mmol/L (ref 3.5–5.1)
Sodium: 136 mmol/L (ref 135–145)

## 2014-11-12 LAB — HEMOGLOBIN AND HEMATOCRIT, BLOOD
HCT: 33.9 % — ABNORMAL LOW (ref 39.0–52.0)
Hemoglobin: 11.1 g/dL — ABNORMAL LOW (ref 13.0–17.0)

## 2014-11-12 NOTE — Progress Notes (Signed)
Occupational Therapy Treatment Patient Details Name: Gregory [name] MRN: [id number] DOB: 20-Mar-1961 Today's Date: 11/12/2014    History of present illness L TSA   OT comments  Completed education with wife/pt. Given written HEP and shoulder protocol information. Pt/wife able to return demonstrate. Pt appears anxious regarding pain control. Pt ready to d/C home when medically stable. To to follow up with Dr. Veverly Fells regarding further therapy needs to progress rehab of LUE.  Follow Up Recommendations  Other (comment);Supervision - Intermittent    Equipment Recommendations  None recommended by OT    Recommendations for Other Services      Precautions / Restrictions Precautions Precautions: Shoulder Type of Shoulder Precautions: Active - ER to neutral; shoulder FF to 90; lap slides; pendulums; AROM elbow wirst/hand; Alsen for hand to mouth Shoulder Interventions: Shoulder sling/immobilizer;Off for dressing/bathing/exercises;For comfort Precaution Booklet Issued: Yes (comment) Required Braces or Orthoses: Sling Restrictions Weight Bearing Restrictions: Yes LUE Weight Bearing: Non weight bearing       Mobility Bed Mobility Overal bed mobility: Independent                Transfers Overall transfer level: Independent                    Balance Overall balance assessment: No apparent balance deficits (not formally assessed)                                 ADL Overall ADL's : Needs assistance/impaired                         Toilet Transfer: Independent   Toileting- Clothing Manipulation and Hygiene: Modified independent       Functional mobility during ADLs: Independent General ADL Comments: completed education with wife/pt regardingcompensatory techniques for ADL, sling use and positioning of arm in sitting/supine. Pt appears anxious about pain control.Educated on use of ice for edema and pain control.                                      Cognition   Behavior During Therapy: WFL for tasks assessed/performed Overall Cognitive Status: Within Functional Limits for tasks assessed                       Extremity/Trunk Assessment  Upper Extremity Assessment Upper Extremity Assessment: LUE deficits/detail LUE Deficits / Details: s/p TSA   Lower Extremity Assessment Lower Extremity Assessment: Overall WFL for tasks assessed   Cervical / Trunk Assessment Cervical / Trunk Assessment: Normal    Exercises Shoulder Exercises Pendulum Exercise: Left;PROM;Standing;15 reps Shoulder Flexion: AROM;AAROM;Left;10 reps;Seated Shoulder Extension: AROM;AAROM;Left;10 reps;Seated Shoulder External Rotation: AAROM;Left;10 reps;Seated (to neutral) Elbow Flexion: AAROM;Left;10 reps Elbow Extension: AAROM;Left;10 reps Wrist Flexion: AROM;Left;10 reps Wrist Extension: AROM;Left;10 reps Digit Composite Flexion: AROM;Left Composite Extension: AROM;Left Donning/doffing shirt without moving shoulder: Patient able to independently direct caregiver;Caregiver independent with task Method for sponge bathing under operated UE: Caregiver independent with task;Patient able to independently direct caregiver Donning/doffing sling/immobilizer: Caregiver independent with task;Patient able to independently direct caregiver Correct positioning of sling/immobilizer: Caregiver independent with task;Patient able to independently direct caregiver Pendulum exercises (written home exercise program): Modified independent ROM for elbow, wrist and digits of operated UE: Independent Sling wearing schedule (on at all times/off for ADL's): Independent Proper positioning of operated  UE when showering: Independent Dressing change: Patient able to independently direct caregiver (pt plans to change dressing tomorrow) Positioning of UE while sleeping: Modified independent   Shoulder Instructions Shoulder Instructions Donning/doffing shirt without  moving shoulder: Patient able to independently direct caregiver;Caregiver independent with task Method for sponge bathing under operated UE: Caregiver independent with task;Patient able to independently direct caregiver Donning/doffing sling/immobilizer: Caregiver independent with task;Patient able to independently direct caregiver Correct positioning of sling/immobilizer: Caregiver independent with task;Patient able to independently direct caregiver Pendulum exercises (written home exercise program): Modified independent ROM for elbow, wrist and digits of operated UE: Independent Sling wearing schedule (on at all times/off for ADL's): Independent Proper positioning of operated UE when showering: Independent Dressing change: Patient able to independently direct caregiver (pt plans to change dressing tomorrow) Positioning of UE while sleeping: Modified independent     General Comments      Pertinent Vitals/ Pain       Pain Assessment: 0-10 Pain Score: 5  Pain Location: L shoulder Pain Descriptors / Indicators: Aching Pain Intervention(s): Limited activity within patient's tolerance;Monitored during session;Repositioned;Ice applied  Home Living Family/patient expects to be discharged to:: Private residence Living Arrangements: Spouse/significant other Available Help at Discharge: Family;Available 24 hours/day Type of Home: House                       Home Equipment: None          Prior Functioning/Environment Level of Independence: Independent            Frequency Min 2X/week     Progress Toward Goals  OT Goals(current goals can now be found in the care plan section)  Progress towards OT goals: Goals met/education completed, patient discharged from OT  Acute Rehab OT Goals Patient Stated Goal: to be able to use my arm OT Goal Formulation: With patient Time For Goal Achievement: 11/19/14 Potential to Achieve Goals: Good  Plan Discharge plan remains  appropriate    Co-evaluation                 End of Session     Activity Tolerance Patient tolerated treatment well   Patient Left in bed;with call bell/phone within reach;with family/visitor present   Nurse Communication Mobility status;Other (comment) (ready for D/C)        Time: 0315-9458 OT Time Calculation (min): 35 min  Charges: OT General Charges $OT Visit: 1 Procedure  OT Treatments $Self Care/Home Management : 8-22 mins $Therapeutic Exercise: 8-22 mins  Joshva Labreck,HILLARY 11/12/2014, 1:15 PM   [facility], OTR/L  [phone] 11/12/2014

## 2014-11-12 NOTE — Op Note (Signed)
NAMETYMERE, DEPUY NO.:  [id number]  MEDICAL RECORD NO.:  [id number]  LOCATION:  [id number]                        FACILITY:  [facility]  PHYSICIAN:  Doran Heater. Veverly Fells, M.D. DATE OF BIRTH:  [birth year]  DATE OF PROCEDURE:  11/11/2014 DATE OF DISCHARGE:                              OPERATIVE REPORT   PREOPERATIVE DIAGNOSIS:  Left shoulder end-stage osteoarthritis.  POSTOPERATIVE DIAGNOSIS:  Left shoulder end-stage osteoarthritis.  PROCEDURE PERFORMED:  Left total shoulder arthroplasty using DePuy Global Unite system with anchor peg glenoid.  ATTENDING SURGEON:  Doran Heater. Veverly Fells, M.D.  ASSISTANT:  Abbott Pao. Dixon, P.A., who has scrubbed during the entire procedure and was necessary for satisfactory completion of surgery.  ANESTHESIA:  General anesthesia was used plus interscalene block.  ESTIMATED BLOOD LOSS:  200 mL.  FLUID REPLACEMENT:  1300 mL crystalloid.  COUNTS:  Instrument count was correct.  COMPLICATIONS:  There were no complications.  ANTIBIOTICS:  Perioperative antibiotics were given.  INDICATIONS:  The patient is a 54 year old male who presents with worsening left shoulder pain and dysfunction secondary to end-stage arthritis.  The patient has bone-on-bone arthritis with significant glenoid retroversion due to advanced wear and posterior capsular contracture.  The patient has interference with the ADLs and sleep.  We discussed options and management with the patient including continued conservative management versus surgical treatment.  The patient opted for total shoulder arthroplasty to restore function and restore glenohumeral anatomy and to eliminate pain.  Risks and benefits of surgery discussed, informed consent obtained.  DESCRIPTION OF PROCEDURE:  After an adequate level of anesthesia was achieved, the patient was positioned in a modified beach-chair position. Left shoulder was correctly identified and sterilely prepped and draped in  usual manner.  Time-out was called.  We then entered the shoulder using standard deltopectoral incision, starting at the coracoid process, extending down to the anterior humeral shaft.  Dissection was carried out down through the subcutaneous tissues.  Cephalic vein was identified and protected and taken laterally with the deltoid, the pectoralis was taken medially.  We placed our deep retractors.  We identified the conjoint tendon, retracted that medially and then released the subscapularis subperiosteally off the lesser tuberosity and then tagged that for repair at the end with #2 FiberWire suture in a modified [employer] suture technique.  Once we had our subscapularis off, we did an inferior capsule release.  We then identified the extremely arthritic humeral head.  There were multiple loose bodies expressed from the joint as well as the large effusion.  We then placed our neck resection guide and externally rotated the shoulder approximately 20 degrees to provide a 20 degree retroverted cut.  I was concerned about over not wanting to over retrovert our humerus because our socket was in extreme retroversion, we need to correct that and try to get some at least neutral bursa with a little bit of anteversion.  Once we had marked our humeral head for resection, we placed a T-handle Crego up under that rotator cuff to protect the cuff during the resection and a large Crego elevator inferiorly to protect the axillary nerve.  We made our resection with an oscillating saw, thoroughly  irrigated, removed excess bone spurs and then finished preparation of the humerus, extending the shoulder and externally rotating, delivered that out of the wound with sequential intramedullary reamers up to a size 14.  We then punched with our humeral punches for the 12 and then the 14 Global Unite stem.  Once we had that done, we went ahead and impacted our trial in place to make sure that would fully sit.   Once we had that, we removed it.  We then subluxed the humerus posteriorly.  We did a 360-degree glenoid labrum removal and capsular release.  The biceps was tenodesed at an appropriate tension and then cut out off the superior labrum.  The intraarticular portion of biceps was resected.  We had a full exposure of the shoulder.  There was significant retroversion noted.  We used a rongeur initially to remove some of the high side bone and cartilage. We then used a Cobb curette to remove the remaining cartilage on the anterior aspect of the humerus.  We identified the inferior scapular border, basically the pillar, to help [place] with our version.  We then went ahead and went as perpendicular as we could to that which created significant eccentric reaming situation, but we placed our guide pin appropriately.  We then began reaming with the low profile reamer, so again we reamed a lot down anteriorly, but we were able to ream down to a 48 socket.  Once we had our reaming flush and perpendicular with the scapula, we went ahead and drilled our central peg hole.  We then placed our Gold guide for peripheral holes and drilled superiorly and then our 2 inferior holes.  We irrigated thoroughly.  We then impacted the trial poly which had good bone support and appropriate size.  We removed that, placed a Gelfoam soaked with epi in the 3 peripheral holes to dry those holes and then vacuum mixed DePuy 1 cement.  We then cemented the anchor peg glenoid into position, again 3 peripheral peg holes only under pressure.  The central peg hole did not have any cement.  Once that was impacted in place and the cement hardened, we did a final inspection of the joint, 1 or 2 more little small loose bodies were encountered and removed.  Thorough irrigation.  We freed up the subscapularis to make sure it was freed from the inner side of the coracoid and freed on both sides of it.  Checked our axillary nerve to make  sure it was in good position.  We then went ahead and drilled our holes in the lesser tuberosity for repair of the subscapularis, placing #2 FiberWire through those holes.  Next, we irrigated thoroughly the humeral canal.  We then used impaction grafting technique with available bone graft from the humeral head and impacted the size 14 Global Unite stem with a 14 proximal metaphyseal component that was impacted in about 20 degrees of retroversion.  Once that was fully seated, we trialed with a first 48, then 52, the 52 gave better coverage.  We started with the 18 which was a little loose and then went to the 21 eccentric humeral head 52.  We selected the real 52 head, impacted that in position, reduced the shoulder.  We can translate it about 50% inferiorly and 50% posteriorly. We then did an anatomic repair of the subscapularis after thorough irrigation including rotator interval closure with the #2 FiberWire and also sutures through bone on the lesser tuberosity.  Once we  had our anatomic repair, we ranged the shoulder.  We had no tethering.  The shoulder felt stable.  We then went ahead and irrigated deltopectoral interval and repaired the deltopectoral interval with 0 Vicryl suture followed by 2-0 Vicryl subcutaneous closure and 4-0 Monocryl for skin. Steri-Strips applied followed by sterile dressing.  The patient tolerated the surgery well.    Doran Heater. Veverly Fells, M.D.    SRN/MEDQ  D:  11/11/2014  T:  11/12/2014  Job:  [id number]

## 2014-11-12 NOTE — Discharge Summary (Signed)
Physician Discharge Summary   Patient ID: Gregory [name] MRN: [id number] DOB/AGE: [birth year] 54 y.o.  Admit date: 11/11/2014 Discharge date: 11/12/2014  Admission Diagnoses:  Active Problems:   S/P shoulder replacement   Discharge Diagnoses:  Same   Surgeries: Procedure(s): LEFT TOTAL SHOULDER ARTHROPLASTY on 11/11/2014   Consultants: OT  Discharged Condition: Stable  Hospital Course: Gregory [name] is an 54 y.o. male who was admitted 11/11/2014 with a chief complaint of left shoulder pain, and found to have a diagnosis of left shoulder OA.  They were brought to the operating room on 11/11/2014 and underwent the above named procedures.    The patient had an uncomplicated hospital course and was stable for discharge.  Recent vital signs:  Filed Vitals:   11/12/14 0524  BP: 111/72  Pulse: 78  Temp: 98.6 F (37 C)  Resp: 16    Recent laboratory studies:  Results for orders placed or performed during the hospital encounter of 11/11/14  Hemoglobin and hematocrit, blood  Result Value Ref Range   Hemoglobin 11.1 (L) 13.0 - 17.0 g/dL   HCT 33.9 (L) 39.0 - 23.5 %  Basic metabolic panel  Result Value Ref Range   Sodium 136 135 - 145 mmol/L   Potassium 4.0 3.5 - 5.1 mmol/L   Chloride 101 101 - 111 mmol/L   CO2 28 22 - 32 mmol/L   Glucose, Bld 118 (H) 65 - 99 mg/dL   BUN 15 6 - 20 mg/dL   Creatinine, Ser 0.79 0.61 - 1.24 mg/dL   Calcium 8.4 (L) 8.9 - 10.3 mg/dL   GFR calc non Af Amer >60 >60 mL/min   GFR calc Af Amer >60 >60 mL/min   Anion gap 7 5 - 15    Discharge Medications:     Medication List    TAKE these medications        lisinopril-hydrochlorothiazide 20-25 MG per tablet  Commonly known as:  PRINZIDE,ZESTORETIC  Take 1 tablet by mouth daily.     methocarbamol 500 MG tablet  Commonly known as:  ROBAXIN  Take 1 tablet (500 mg total) by mouth 3 (three) times daily as needed.     oxyCODONE-acetaminophen 5-325 MG per tablet  Commonly known as:  ROXICET    Take 1-2 tablets by mouth every 4 (four) hours as needed for severe pain.     PARoxetine 12.5 MG 24 hr tablet  Commonly known as:  PAXIL-CR  Take 1 tablet (12.5 mg total) by mouth every morning.     traMADol 50 MG tablet  Commonly known as:  ULTRAM  Take 50 mg by mouth every 6 (six) hours as needed for moderate pain or severe pain.        Diagnostic Studies: Dg Shoulder Left Port  11/11/2014   CLINICAL DATA:  Post LEFT shoulder replacement  EXAM: LEFT SHOULDER - 1 VIEW  COMPARISON:  Portable exam 1138 hours without priors for comparison.  FINDINGS: Single AP view of the LEFT shoulder demonstrates at LEFT shoulder prosthesis.  No gross evidence of fracture or dislocation identified on single AP view.  AC joint alignment normal.  Osseous mineralization normal.  Visualized LEFT ribs intact.  IMPRESSION: LEFT shoulder prosthesis without acute abnormalities.   Electronically Signed   By: Lavonia Dana M.D.   On: 11/11/2014 13:10    Disposition: home        Follow-up Information    Follow up with Izan Miron,STEVEN R, MD. Call in 2 weeks.   Specialty:  Orthopedic  Surgery   Why:  [phone]   Contact information:   [address] [place] [address] [phone]        Signed: Augustin Schooling 11/12/2014, 8:37 AM

## 2014-11-12 NOTE — Progress Notes (Signed)
Orthopedics Progress Note  Subjective: I feel the block slowly wearing off.  Objective:  Filed Vitals:   11/12/14 0524  BP: 111/72  Pulse: 78  Temp: 98.6 F (37 C)  Resp: 16    General: Awake and alert  Musculoskeletal: left shoulder dressing CDI, NVI Neurovascularly intact  Lab Results  Component Value Date   WBC 7.7 11/02/2014   HGB 11.1* 11/12/2014   HCT 33.9* 11/12/2014   MCV 92.9 11/02/2014   PLT 330 11/02/2014       Component Value Date/Time   NA 136 11/12/2014 0525   K 4.0 11/12/2014 0525   CL 101 11/12/2014 0525   CO2 28 11/12/2014 0525   GLUCOSE 118* 11/12/2014 0525   GLUCOSE 98 05/23/2006 0733   BUN 15 11/12/2014 0525   CREATININE 0.79 11/12/2014 0525   CALCIUM 8.4* 11/12/2014 0525   GFRNONAA >60 11/12/2014 0525   GFRAA >60 11/12/2014 0525    No results found for: INR, PROTIME  Assessment/Plan: POD #1 s/p Procedure(s): LEFT TOTAL SHOULDER ARTHROPLASTY OT this morning, Possible D/C later today  Remo Lipps R. Veverly Fells, MD 11/12/2014 8:34 AM

## 2014-11-12 NOTE — Care Management Note (Signed)
Case Management Note  Patient Details  Name: Gregory [name] MRN: [id number] Date of Birth: 02-08-1961  Subjective/Objective:                  LEFT TOTAL SHOULDER ARTHROPLASTY on 11/11/2014  Action/Plan: Discharge planning  Expected Discharge Date:  11/12/14           Expected Discharge Plan:  Home/Self Care  In-House Referral:  NA  Discharge planning Services  CM Consult  Post Acute Care Choice:  NA Choice offered to:  NA  DME Arranged:    DME Agency:     HH Arranged:    [place] Agency:     Status of Service:  Completed, signed off  Medicare Important Message Given:    Date Medicare IM Given:    Medicare IM give by:    Date Additional Medicare IM Given:    Additional Medicare Important Message give by:     If discussed at [place] of Stay Meetings, dates discussed:    Additional Comments: CM spoke to patient about discharge plan with spouse at bedside. Patient denies needs for any home health services or DME needs. No further CM needs communicated. Guido Sander, RN 11/12/2014, 10:23 AM

## 2014-11-12 NOTE — Progress Notes (Signed)
Occupational Therapy Evaluation Patient Details Name: Gregory [name] MRN: [id number] DOB: 08-24-1960 Today's Date: 11/12/2014    History of Present Illness L TSA   Clinical Impression   Began education with pt regarding HEP and shoulder protocol, in addition to teaching compensatory techniques. Will return to complete education with wife prior to D/C. Pt to follow up with Dr. Veverly Fells for further therapy needs.    Follow Up Recommendations  Other (comment);Supervision - Intermittent (follow up with Dr. Veverly Fells per shoulder protocol)    Equipment Recommendations  None recommended by OT    Recommendations for Other Services       Precautions / Restrictions Precautions Precautions: Shoulder Type of Shoulder Precautions: Active - ER to neutral; shoulder FF to 90; lap slides; pendulums; AROM elbow wirst/hand; [place] for hand to mouth Shoulder Interventions: Shoulder sling/immobilizer;Off for dressing/bathing/exercises;For comfort Precaution Booklet Issued: Yes (comment) Required Braces or Orthoses: Sling Restrictions Weight Bearing Restrictions: Yes LUE Weight Bearing: Non weight bearing      Mobility Bed Mobility Overal bed mobility: Independent                Transfers Overall transfer level: Independent                    Balance Overall balance assessment: No apparent balance deficits (not formally assessed)                                          ADL Overall ADL's : Needs assistance/impaired                         Toilet Transfer: Independent   Toileting- Clothing Manipulation and Hygiene: Modified independent       Functional mobility during ADLs: Independent General ADL Comments: Began education regarding compensatory techniques and protocol for shoulder HEP. Given written information. will return tnad review with wife.     Vision     Perception     Praxis      Pertinent Vitals/Pain Pain Assessment: 0-10 Pain  Score: 5  Pain Location: L shoulder Pain Descriptors / Indicators: Aching Pain Intervention(s): Limited activity within patient's tolerance;Monitored during session;Repositioned;Ice applied     Hand Dominance Right   Extremity/Trunk Assessment Upper Extremity Assessment Upper Extremity Assessment: LUE deficits/detail LUE Deficits / Details: s/p TSA   Lower Extremity Assessment Lower Extremity Assessment: Overall WFL for tasks assessed   Cervical / Trunk Assessment Cervical / Trunk Assessment: Normal   Communication Communication Communication: No difficulties   Cognition Arousal/Alertness: Awake/alert Behavior During Therapy: WFL for tasks assessed/performed Overall Cognitive Status: Within Functional Limits for tasks assessed                     General Comments       Exercises Exercises: Shoulder     Shoulder Instructions Shoulder Instructions Donning/doffing shirt without moving shoulder: Moderate assistance Method for sponge bathing under operated UE: Moderate assistance Donning/doffing sling/immobilizer: Moderate assistance Correct positioning of sling/immobilizer: Moderate assistance ROM for elbow, wrist and digits of operated UE: Minimal assistance Sling wearing schedule (on at all times/off for ADL's): Minimal assistance Proper positioning of operated UE when showering: Minimal assistance Positioning of UE while sleeping: Minimal assistance    Home Living Family/patient expects to be discharged to:: Private residence Living Arrangements: Spouse/significant other Available Help at Discharge: Family;Available 24 hours/day Type of  Home: House                       Home Equipment: None          Prior Functioning/Environment Level of Independence: Independent             OT Diagnosis: Generalized weakness;Acute pain   OT Problem List: Decreased strength;Decreased range of motion;Decreased coordination;Decreased knowledge of  precautions;Obesity;Impaired UE functional use;Pain   OT Treatment/Interventions: Self-care/ADL training;Therapeutic exercise;Therapeutic activities;Patient/family education    OT Goals(Current goals can be found in the care plan section) Acute Rehab OT Goals Patient Stated Goal: to be able to use my arm OT Goal Formulation: With patient Time For Goal Achievement: 11/19/14 Potential to Achieve Goals: Good  OT Frequency: Min 2X/week   Barriers to D/C:            Co-evaluation              End of Session Nurse Communication: Mobility status  Activity Tolerance: Patient tolerated treatment well Patient left: in chair;with call bell/phone within reach   Time: 0905-0928 OT Time Calculation (min): 23 min Charges:  OT General Charges $OT Visit: 1 Procedure OT Evaluation $Initial OT Evaluation Tier I: 1 Procedure OT Treatments $Self Care/Home Management : 8-22 mins G-Codes:    Dosha Broshears,HILLARY 11/14/2014, 1:04 PM   [facility], OTR/L  [phone] 11-14-14

## 2014-11-14 ENCOUNTER — Encounter (HOSPITAL_COMMUNITY): Payer: Self-pay | Admitting: Orthopedic Surgery

## 2014-11-14 MED FILL — Thrombin For Soln 5000 Unit: CUTANEOUS | Qty: 5000 | Status: AC

## 2014-11-14 NOTE — Anesthesia Postprocedure Evaluation (Signed)
Anesthesia Post Note  Patient: Gregory [name]  Procedure(s) Performed: Procedure(s) (LRB): LEFT TOTAL SHOULDER ARTHROPLASTY (Left)  Anesthesia type: General  Patient location: PACU  Post pain: Pain level controlled  Post assessment: Post-op Vital signs reviewed  Last Vitals: BP 111/72 mmHg  Pulse 78  Temp(Src) 37 C (Oral)  Resp 16  Ht 6\' 2"  (1.88 m)  Wt 254 lb 1.6 oz (115.259 kg)  BMI 32.61 kg/m2  SpO2 97%  Post vital signs: Reviewed  Level of consciousness: sedated  Complications: No apparent anesthesia complications

## 2015-06-12 ENCOUNTER — Ambulatory Visit (INDEPENDENT_AMBULATORY_CARE_PROVIDER_SITE_OTHER): Payer: 59 | Admitting: Ophthalmology

## 2015-07-10 ENCOUNTER — Ambulatory Visit (INDEPENDENT_AMBULATORY_CARE_PROVIDER_SITE_OTHER): Payer: Self-pay | Admitting: Ophthalmology

## 2015-07-19 ENCOUNTER — Ambulatory Visit (INDEPENDENT_AMBULATORY_CARE_PROVIDER_SITE_OTHER): Payer: Self-pay | Admitting: Ophthalmology

## 2015-07-31 ENCOUNTER — Ambulatory Visit (INDEPENDENT_AMBULATORY_CARE_PROVIDER_SITE_OTHER): Payer: PRIVATE HEALTH INSURANCE | Admitting: Ophthalmology

## 2015-07-31 DIAGNOSIS — H35033 Hypertensive retinopathy, bilateral: Secondary | ICD-10-CM | POA: Diagnosis not present

## 2015-07-31 DIAGNOSIS — H353211 Exudative age-related macular degeneration, right eye, with active choroidal neovascularization: Secondary | ICD-10-CM

## 2015-07-31 DIAGNOSIS — H2513 Age-related nuclear cataract, bilateral: Secondary | ICD-10-CM | POA: Diagnosis not present

## 2015-07-31 DIAGNOSIS — I10 Essential (primary) hypertension: Secondary | ICD-10-CM

## 2015-07-31 DIAGNOSIS — H43813 Vitreous degeneration, bilateral: Secondary | ICD-10-CM

## 2015-07-31 DIAGNOSIS — H353121 Nonexudative age-related macular degeneration, left eye, early dry stage: Secondary | ICD-10-CM | POA: Diagnosis not present

## 2015-08-23 ENCOUNTER — Encounter: Payer: Self-pay | Admitting: Gastroenterology

## 2015-11-03 ENCOUNTER — Ambulatory Visit (AMBULATORY_SURGERY_CENTER): Payer: Self-pay | Admitting: *Deleted

## 2015-11-03 VITALS — Ht 74.0 in | Wt 230.0 lb

## 2015-11-03 DIAGNOSIS — Z1211 Encounter for screening for malignant neoplasm of colon: Secondary | ICD-10-CM

## 2015-11-03 NOTE — Progress Notes (Signed)
No egg or soy allergy known to patient  No issues with past sedation with any surgeries  or procedures, no intubation problems  No diet pills per patient No home 02 use per patient  No blood thinners per patient  Pt denies issues with constipation  emmi declined

## 2015-11-07 LAB — HM COLONOSCOPY

## 2015-11-17 ENCOUNTER — Encounter: Payer: Self-pay | Admitting: Gastroenterology

## 2015-11-17 ENCOUNTER — Ambulatory Visit (AMBULATORY_SURGERY_CENTER): Payer: PRIVATE HEALTH INSURANCE | Admitting: Gastroenterology

## 2015-11-17 VITALS — BP 94/58 | HR 80 | Temp 97.7°F | Resp 12 | Ht 74.0 in | Wt 230.0 lb

## 2015-11-17 DIAGNOSIS — Z1211 Encounter for screening for malignant neoplasm of colon: Secondary | ICD-10-CM

## 2015-11-17 MED ORDER — SODIUM CHLORIDE 0.9 % IV SOLN: 500.0000 mL | INTRAVENOUS | Status: DC

## 2015-11-17 NOTE — Progress Notes (Signed)
Report to PACU, RN, vss, BBS= Clear.  

## 2015-11-17 NOTE — Patient Instructions (Signed)
YOU HAD AN ENDOSCOPIC PROCEDURE TODAY AT THE [facility]:   Refer to the procedure report that was given to you for any specific questions about what was found during the examination.  If the procedure report does not answer your questions, please call your gastroenterologist to clarify.  If you requested that your care partner not be given the details of your procedure findings, then the procedure report has been included in a sealed envelope for you to review at your convenience later.  YOU SHOULD EXPECT: Some feelings of bloating in the abdomen. Passage of more gas than usual.  Walking can help get rid of the air that was put into your GI tract during the procedure and reduce the bloating. If you had a lower endoscopy (such as a colonoscopy or flexible sigmoidoscopy) you may notice spotting of blood in your stool or on the toilet paper. If you underwent a bowel prep for your procedure, you may not have a normal bowel movement for a few days.  Please Note:  You might notice some irritation and congestion in your nose or some drainage.  This is from the oxygen used during your procedure.  There is no need for concern and it should clear up in a day or so.  SYMPTOMS TO REPORT IMMEDIATELY:   Following lower endoscopy (colonoscopy or flexible sigmoidoscopy):  Excessive amounts of blood in the stool  Significant tenderness or worsening of abdominal pains  Swelling of the abdomen that is new, acute  Fever of 100F or higher   For urgent or emergent issues, a gastroenterologist can be reached at any hour by calling [phone].   DIET: Your first meal following the procedure should be a small meal and then it is ok to progress to your normal diet. Heavy or fried foods are harder to digest and may make you feel nauseous or bloated.  Likewise, meals heavy in dairy and vegetables can increase bloating.  Drink plenty of fluids but you should avoid alcoholic beverages for 24 hours. Try to  increase the fiber in your diet.  Drink plenty of water.  ACTIVITY:  You should plan to take it easy for the rest of today and you should NOT DRIVE or use heavy machinery until tomorrow (because of the sedation medicines used during the test).    FOLLOW UP: Our staff will call the number listed on your records the next business day following your procedure to check on you and address any questions or concerns that you may have regarding the information given to you following your procedure. If we do not reach you, we will leave a message.  However, if you are feeling well and you are not experiencing any problems, there is no need to return our call.  We will assume that you have returned to your regular daily activities without incident.  If any biopsies were taken you will be contacted by phone or by letter within the next 1-3 weeks.  Please call us at [phone] if you have not heard about the biopsies in 3 weeks.    SIGNATURES/CONFIDENTIALITY: You and/or your care partner have signed paperwork which will be entered into your electronic medical record.  These signatures attest to the fact that that the information above on your After Visit Summary has been reviewed and is understood.  Full responsibility of the confidentiality of this discharge information lies with you and/or your care-partner.  Read all of the information given to you by your recovery   room nurse.  Thank-you for choosing us for your healthcare needs today. 

## 2015-11-17 NOTE — Op Note (Signed)
[facility] Patient Name: Gregory [name] Procedure Date: 11/17/2015 8:00 AM MRN: [id number] Endoscopist: Mallie Mussel L. Loletha Carrow , MD Age: 55 Referring MD:  Date of Birth: 1961/03/22 Gender: Male Account #: [id number] Procedure:                Colonoscopy Indications:              Screening for colorectal malignant neoplasm Medicines:                Monitored Anesthesia Care Procedure:                Pre-Anesthesia Assessment:                           - Prior to the procedure, a History and Physical                            was performed, and patient medications and                            allergies were reviewed. The patient's tolerance of                            previous anesthesia was also reviewed. The risks                            and benefits of the procedure and the sedation                            options and risks were discussed with the patient.                            All questions were answered, and informed consent                            was obtained. Prior Anticoagulants: The patient has                            taken no previous anticoagulant or antiplatelet                            agents. ASA Grade Assessment: II - A patient with                            mild systemic disease. After reviewing the risks                            and benefits, the patient was deemed in                            satisfactory condition to undergo the procedure.                           After obtaining informed consent, the colonoscope  was passed under direct vision. Throughout the                            procedure, the patient's blood pressure, pulse, and                            oxygen saturations were monitored continuously. The                            Model CF-HQ190L [id number] scope was introduced                            through the anus and advanced to the the cecum,                            identified by  appendiceal orifice and ileocecal                            valve. The colonoscopy was performed without                            difficulty. The patient tolerated the procedure                            well. The quality of the bowel preparation was                            good. The ileocecal valve, appendiceal orifice, and                            rectum were photographed. The bowel preparation                            used was Miralax. Scope In: 8:15:54 AM Scope Out: 8:36:27 AM Scope Withdrawal Time: 0 hours 14 minutes 59 seconds  Total Procedure Duration: 0 hours 20 minutes 33 seconds  Findings:                 The perianal and digital rectal examinations were                            normal.                           Multiple medium-mouthed diverticula were found in                            the left colon.                           Internal hemorrhoids were found during                            retroflexion. The hemorrhoids were Grade I                            (  internal hemorrhoids that do not prolapse). Complications:            No immediate complications. Estimated Blood Loss:     Estimated blood loss: none. Impression:               - Diverticulosis in the left colon.                           - Internal hemorrhoids.                           - No specimens collected. Recommendation:           - Patient has a contact number available for                            emergencies. The signs and symptoms of potential                            delayed complications were discussed with the                            patient. Return to normal activities tomorrow.                            Written discharge instructions were provided to the                            patient.                           - Resume previous diet.                           - Continue present medications.                           - Repeat colonoscopy in 10 years for screening                             purposes. Henry L. Loletha Carrow, MD 11/17/2015 8:42:10 AM This report has been signed electronically.

## 2015-11-20 ENCOUNTER — Telehealth: Payer: Self-pay | Admitting: *Deleted

## 2015-11-20 NOTE — Telephone Encounter (Signed)
No answer, left message to call if questions or concerns. 

## 2015-12-26 DIAGNOSIS — I10 Essential (primary) hypertension: Secondary | ICD-10-CM | POA: Diagnosis not present

## 2015-12-26 DIAGNOSIS — N390 Urinary tract infection, site not specified: Secondary | ICD-10-CM | POA: Diagnosis not present

## 2015-12-26 DIAGNOSIS — Z125 Encounter for screening for malignant neoplasm of prostate: Secondary | ICD-10-CM | POA: Diagnosis not present

## 2015-12-26 DIAGNOSIS — Z0000 Encounter for general adult medical examination without abnormal findings: Secondary | ICD-10-CM | POA: Diagnosis not present

## 2015-12-26 DIAGNOSIS — R8299 Other abnormal findings in urine: Secondary | ICD-10-CM | POA: Diagnosis not present

## 2015-12-26 DIAGNOSIS — D509 Iron deficiency anemia, unspecified: Secondary | ICD-10-CM | POA: Diagnosis not present

## 2016-01-01 ENCOUNTER — Telehealth: Payer: Self-pay

## 2016-01-01 NOTE — Telephone Encounter (Signed)
Gregory Stabler, MD  Elias Else, CMA        I rec'd and reviewed records from Dr Osborne Casco, showing Hgb 8.2 (nml MCV) this week, low serum iron of 12 and low Iron sat but ferritin of >1800 and B12 low at 88.  Dr Osborne Casco is concerned about this patient's iron deficiency anemia, and they are scheduling iron infusions.  I did a screening colonoscopy for this patient in June.  Please contact Gregory [name] and tell him that I feel his IDA warrants an EGD to see if there is a source of blood loss.If agreeable, please schedule at earliest available.  Please copy all of this into encounter documentation.   - HD    Pt contacted. He would like to hold on scheduling any procedures at this time. He has an upcoming appointment this week with Dr Osborne Casco and would like to discuss this further with him first. Pt instructed to call back when he's ready to schedule.

## 2016-01-02 ENCOUNTER — Other Ambulatory Visit (HOSPITAL_COMMUNITY): Payer: Self-pay | Admitting: *Deleted

## 2016-01-02 DIAGNOSIS — R634 Abnormal weight loss: Secondary | ICD-10-CM | POA: Diagnosis not present

## 2016-01-02 DIAGNOSIS — D509 Iron deficiency anemia, unspecified: Secondary | ICD-10-CM | POA: Diagnosis not present

## 2016-01-02 DIAGNOSIS — Z125 Encounter for screening for malignant neoplasm of prostate: Secondary | ICD-10-CM | POA: Diagnosis not present

## 2016-01-02 DIAGNOSIS — E46 Unspecified protein-calorie malnutrition: Secondary | ICD-10-CM | POA: Diagnosis not present

## 2016-01-02 DIAGNOSIS — E538 Deficiency of other specified B group vitamins: Secondary | ICD-10-CM | POA: Diagnosis not present

## 2016-01-02 DIAGNOSIS — Z1389 Encounter for screening for other disorder: Secondary | ICD-10-CM | POA: Diagnosis not present

## 2016-01-02 DIAGNOSIS — Z0000 Encounter for general adult medical examination without abnormal findings: Secondary | ICD-10-CM | POA: Diagnosis not present

## 2016-01-03 ENCOUNTER — Ambulatory Visit (HOSPITAL_COMMUNITY)
Admission: RE | Admit: 2016-01-03 | Discharge: 2016-01-03 | Disposition: A | Discharge status: Home / Self Care | Payer: BLUE CROSS/BLUE SHIELD | Source: Ambulatory Visit | Attending: Internal Medicine | Admitting: Internal Medicine

## 2016-01-03 DIAGNOSIS — D649 Anemia, unspecified: Secondary | ICD-10-CM | POA: Diagnosis not present

## 2016-01-03 MED ORDER — SODIUM CHLORIDE 0.9 % IV SOLN
510.0000 mg | INTRAVENOUS | Status: DC
  Administered 2016-01-03: 510 mg via INTRAVENOUS
  Filled 2016-01-03: qty 17

## 2016-01-04 ENCOUNTER — Encounter (HOSPITAL_COMMUNITY): Payer: PRIVATE HEALTH INSURANCE

## 2016-01-04 ENCOUNTER — Encounter: Payer: Self-pay | Admitting: Gastroenterology

## 2016-01-09 ENCOUNTER — Other Ambulatory Visit (HOSPITAL_COMMUNITY): Payer: Self-pay | Admitting: *Deleted

## 2016-01-09 DIAGNOSIS — R634 Abnormal weight loss: Secondary | ICD-10-CM | POA: Diagnosis not present

## 2016-01-09 DIAGNOSIS — R109 Unspecified abdominal pain: Secondary | ICD-10-CM | POA: Diagnosis not present

## 2016-01-09 DIAGNOSIS — D509 Iron deficiency anemia, unspecified: Secondary | ICD-10-CM | POA: Diagnosis not present

## 2016-01-09 DIAGNOSIS — D508 Other iron deficiency anemias: Secondary | ICD-10-CM | POA: Diagnosis not present

## 2016-01-10 ENCOUNTER — Encounter (HOSPITAL_COMMUNITY): Payer: PRIVATE HEALTH INSURANCE

## 2016-01-10 DIAGNOSIS — R634 Abnormal weight loss: Secondary | ICD-10-CM | POA: Diagnosis not present

## 2016-01-10 DIAGNOSIS — D509 Iron deficiency anemia, unspecified: Secondary | ICD-10-CM | POA: Diagnosis not present

## 2016-01-10 DIAGNOSIS — K3184 Gastroparesis: Secondary | ICD-10-CM | POA: Diagnosis not present

## 2016-01-10 DIAGNOSIS — D649 Anemia, unspecified: Secondary | ICD-10-CM | POA: Diagnosis not present

## 2016-01-10 DIAGNOSIS — R140 Abdominal distension (gaseous): Secondary | ICD-10-CM | POA: Diagnosis not present

## 2016-01-11 DIAGNOSIS — N2889 Other specified disorders of kidney and ureter: Secondary | ICD-10-CM | POA: Diagnosis not present

## 2016-01-11 DIAGNOSIS — D649 Anemia, unspecified: Secondary | ICD-10-CM | POA: Diagnosis not present

## 2016-01-11 DIAGNOSIS — N289 Disorder of kidney and ureter, unspecified: Secondary | ICD-10-CM | POA: Diagnosis not present

## 2016-01-11 DIAGNOSIS — D508 Other iron deficiency anemias: Secondary | ICD-10-CM | POA: Diagnosis not present

## 2016-01-11 DIAGNOSIS — R634 Abnormal weight loss: Secondary | ICD-10-CM | POA: Diagnosis not present

## 2016-01-11 DIAGNOSIS — C649 Malignant neoplasm of unspecified kidney, except renal pelvis: Secondary | ICD-10-CM | POA: Diagnosis not present

## 2016-01-11 DIAGNOSIS — E538 Deficiency of other specified B group vitamins: Secondary | ICD-10-CM | POA: Diagnosis not present

## 2016-01-11 DIAGNOSIS — R109 Unspecified abdominal pain: Secondary | ICD-10-CM | POA: Diagnosis not present

## 2016-01-12 DIAGNOSIS — D509 Iron deficiency anemia, unspecified: Secondary | ICD-10-CM | POA: Diagnosis not present

## 2016-01-12 DIAGNOSIS — C649 Malignant neoplasm of unspecified kidney, except renal pelvis: Secondary | ICD-10-CM | POA: Diagnosis not present

## 2016-01-12 DIAGNOSIS — K2970 Gastritis, unspecified, without bleeding: Secondary | ICD-10-CM | POA: Diagnosis not present

## 2016-01-12 DIAGNOSIS — D649 Anemia, unspecified: Secondary | ICD-10-CM | POA: Diagnosis not present

## 2016-01-12 DIAGNOSIS — R918 Other nonspecific abnormal finding of lung field: Secondary | ICD-10-CM | POA: Diagnosis not present

## 2016-01-12 DIAGNOSIS — K2950 Unspecified chronic gastritis without bleeding: Secondary | ICD-10-CM | POA: Diagnosis not present

## 2016-01-15 DIAGNOSIS — R918 Other nonspecific abnormal finding of lung field: Secondary | ICD-10-CM | POA: Diagnosis not present

## 2016-01-15 DIAGNOSIS — N2889 Other specified disorders of kidney and ureter: Secondary | ICD-10-CM | POA: Diagnosis not present

## 2016-01-18 ENCOUNTER — Encounter: Payer: Self-pay | Admitting: Internal Medicine

## 2016-01-18 DIAGNOSIS — C649 Malignant neoplasm of unspecified kidney, except renal pelvis: Secondary | ICD-10-CM | POA: Diagnosis not present

## 2016-01-19 ENCOUNTER — Encounter: Payer: PRIVATE HEALTH INSURANCE | Admitting: Gastroenterology

## 2016-01-19 DIAGNOSIS — D630 Anemia in neoplastic disease: Secondary | ICD-10-CM | POA: Diagnosis not present

## 2016-01-19 DIAGNOSIS — C801 Malignant (primary) neoplasm, unspecified: Secondary | ICD-10-CM | POA: Diagnosis not present

## 2016-01-19 DIAGNOSIS — C641 Malignant neoplasm of right kidney, except renal pelvis: Secondary | ICD-10-CM | POA: Diagnosis not present

## 2016-01-22 ENCOUNTER — Encounter: Payer: PRIVATE HEALTH INSURANCE | Admitting: Gastroenterology

## 2016-01-24 DIAGNOSIS — C641 Malignant neoplasm of right kidney, except renal pelvis: Secondary | ICD-10-CM | POA: Diagnosis not present

## 2016-01-24 DIAGNOSIS — R918 Other nonspecific abnormal finding of lung field: Secondary | ICD-10-CM | POA: Diagnosis not present

## 2016-02-05 DIAGNOSIS — C641 Malignant neoplasm of right kidney, except renal pelvis: Secondary | ICD-10-CM | POA: Diagnosis not present

## 2016-02-05 DIAGNOSIS — I10 Essential (primary) hypertension: Secondary | ICD-10-CM | POA: Diagnosis not present

## 2016-02-05 DIAGNOSIS — Z79899 Other long term (current) drug therapy: Secondary | ICD-10-CM | POA: Diagnosis not present

## 2016-02-05 DIAGNOSIS — N2889 Other specified disorders of kidney and ureter: Secondary | ICD-10-CM | POA: Diagnosis not present

## 2016-02-05 DIAGNOSIS — F411 Generalized anxiety disorder: Secondary | ICD-10-CM | POA: Diagnosis not present

## 2016-02-05 DIAGNOSIS — Z01818 Encounter for other preprocedural examination: Secondary | ICD-10-CM | POA: Diagnosis not present

## 2016-02-05 DIAGNOSIS — F419 Anxiety disorder, unspecified: Secondary | ICD-10-CM | POA: Diagnosis not present

## 2016-02-05 DIAGNOSIS — D630 Anemia in neoplastic disease: Secondary | ICD-10-CM | POA: Diagnosis not present

## 2016-02-05 DIAGNOSIS — R791 Abnormal coagulation profile: Secondary | ICD-10-CM | POA: Diagnosis not present

## 2016-02-05 DIAGNOSIS — E039 Hypothyroidism, unspecified: Secondary | ICD-10-CM | POA: Diagnosis not present

## 2016-02-05 DIAGNOSIS — Z01810 Encounter for preprocedural cardiovascular examination: Secondary | ICD-10-CM | POA: Diagnosis not present

## 2016-02-07 DIAGNOSIS — N2889 Other specified disorders of kidney and ureter: Secondary | ICD-10-CM | POA: Diagnosis not present

## 2016-02-07 DIAGNOSIS — I499 Cardiac arrhythmia, unspecified: Secondary | ICD-10-CM | POA: Diagnosis not present

## 2016-02-07 DIAGNOSIS — Z79899 Other long term (current) drug therapy: Secondary | ICD-10-CM | POA: Diagnosis not present

## 2016-02-07 DIAGNOSIS — E119 Type 2 diabetes mellitus without complications: Secondary | ICD-10-CM | POA: Diagnosis not present

## 2016-02-07 DIAGNOSIS — E785 Hyperlipidemia, unspecified: Secondary | ICD-10-CM | POA: Diagnosis not present

## 2016-02-07 DIAGNOSIS — Z808 Family history of malignant neoplasm of other organs or systems: Secondary | ICD-10-CM | POA: Diagnosis not present

## 2016-02-07 DIAGNOSIS — R3129 Other microscopic hematuria: Secondary | ICD-10-CM | POA: Diagnosis not present

## 2016-02-07 DIAGNOSIS — R001 Bradycardia, unspecified: Secondary | ICD-10-CM | POA: Diagnosis not present

## 2016-02-07 DIAGNOSIS — I10 Essential (primary) hypertension: Secondary | ICD-10-CM | POA: Diagnosis not present

## 2016-02-07 DIAGNOSIS — C641 Malignant neoplasm of right kidney, except renal pelvis: Secondary | ICD-10-CM | POA: Diagnosis not present

## 2016-02-07 DIAGNOSIS — R8290 Unspecified abnormal findings in urine: Secondary | ICD-10-CM | POA: Diagnosis not present

## 2016-02-07 DIAGNOSIS — I429 Cardiomyopathy, unspecified: Secondary | ICD-10-CM | POA: Diagnosis not present

## 2016-02-07 DIAGNOSIS — I4581 Long QT syndrome: Secondary | ICD-10-CM | POA: Diagnosis not present

## 2016-02-08 DIAGNOSIS — R001 Bradycardia, unspecified: Secondary | ICD-10-CM | POA: Diagnosis not present

## 2016-02-08 DIAGNOSIS — N2889 Other specified disorders of kidney and ureter: Secondary | ICD-10-CM | POA: Diagnosis not present

## 2016-02-08 DIAGNOSIS — I10 Essential (primary) hypertension: Secondary | ICD-10-CM | POA: Diagnosis not present

## 2016-02-09 DIAGNOSIS — I429 Cardiomyopathy, unspecified: Secondary | ICD-10-CM | POA: Diagnosis not present

## 2016-02-09 DIAGNOSIS — R001 Bradycardia, unspecified: Secondary | ICD-10-CM | POA: Diagnosis not present

## 2016-02-09 DIAGNOSIS — I499 Cardiac arrhythmia, unspecified: Secondary | ICD-10-CM | POA: Diagnosis not present

## 2016-02-09 DIAGNOSIS — N2889 Other specified disorders of kidney and ureter: Secondary | ICD-10-CM | POA: Diagnosis not present

## 2016-02-09 DIAGNOSIS — I4581 Long QT syndrome: Secondary | ICD-10-CM | POA: Diagnosis not present

## 2016-02-14 DIAGNOSIS — C641 Malignant neoplasm of right kidney, except renal pelvis: Secondary | ICD-10-CM | POA: Diagnosis not present

## 2016-03-05 ENCOUNTER — Telehealth (HOSPITAL_COMMUNITY): Payer: Self-pay | Admitting: Vascular Surgery

## 2016-03-05 ENCOUNTER — Encounter (HOSPITAL_COMMUNITY): Payer: Self-pay | Admitting: Internal Medicine

## 2016-03-05 ENCOUNTER — Ambulatory Visit (HOSPITAL_COMMUNITY)
Admission: RE | Admit: 2016-03-05 | Discharge: 2016-03-05 | Disposition: A | Discharge status: Home / Self Care | Payer: BLUE CROSS/BLUE SHIELD | Source: Ambulatory Visit | Attending: Internal Medicine | Admitting: Internal Medicine

## 2016-03-05 ENCOUNTER — Telehealth (HOSPITAL_COMMUNITY): Payer: Self-pay | Admitting: *Deleted

## 2016-03-05 VITALS — BP 134/90 | HR 83 | Wt 235.8 lb

## 2016-03-05 DIAGNOSIS — R001 Bradycardia, unspecified: Secondary | ICD-10-CM | POA: Insufficient documentation

## 2016-03-05 DIAGNOSIS — Z79899 Other long term (current) drug therapy: Secondary | ICD-10-CM | POA: Insufficient documentation

## 2016-03-05 DIAGNOSIS — R931 Abnormal findings on diagnostic imaging of heart and coronary circulation: Secondary | ICD-10-CM

## 2016-03-05 DIAGNOSIS — Z85528 Personal history of other malignant neoplasm of kidney: Secondary | ICD-10-CM | POA: Insufficient documentation

## 2016-03-05 DIAGNOSIS — I429 Cardiomyopathy, unspecified: Secondary | ICD-10-CM | POA: Insufficient documentation

## 2016-03-05 DIAGNOSIS — Z905 Acquired absence of kidney: Secondary | ICD-10-CM | POA: Diagnosis not present

## 2016-03-05 NOTE — Telephone Encounter (Signed)
Pre cert pending for CMRI

## 2016-03-05 NOTE — Patient Instructions (Signed)
Your physician has requested that you have an exercise tolerance test. For further information please visit [email or web address]. Please also follow instruction sheet, as given.  Your physician has requested that you have a cardiac MRI. Cardiac MRI uses a computer to create images of your heart as its beating, producing both still and moving pictures of your heart and major blood vessels. For further information please visit [email or web address] Please follow the instruction sheet given to you today for more information.  Your physician has recommended that you have a sleep study. This test records several body functions during sleep, including: brain activity, eye movement, oxygen and carbon dioxide blood levels, heart rate and rhythm, breathing rate and rhythm, the flow of air through your mouth and nose, snoring, body muscle movements, and chest and belly movement.  Your physician recommends that you schedule a follow-up appointment in: 6 weeks

## 2016-03-05 NOTE — Telephone Encounter (Signed)
Left pt message to give sleep study appt and to make 6 week f/u w/ DB

## 2016-03-05 NOTE — Telephone Encounter (Signed)
SENT Rollene Fare griffin message to call pt to make ETT appt

## 2016-03-05 NOTE — Progress Notes (Signed)
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  Denies any h/o known cardiac issues. Is very active as a Geophysicist/field seismologist. Developed microscopic hematuria and found to have large right renal mass c/w renal cell CA. Eventually underwent right radical nephrectomy with adrenal sparing on 02/07/16 at [facility]. Post-op developed bradycardia with HRs in 30s while sleeping. BP stable. When awakened had dizziness so given atropine. ECG revealed sinus brady in 40s with mild QT prolongation which resolved. Echo done and EF 45% with global HK. RV dilated  With normal function .  Says he snores intermittently. Does not wear sleep apnea. Denies daytime fatigue.  Feeling better. Back to baseline. No CP or DOE. No orthopnea or PND.   FHx:   Father died at 51 from HF (unknown CAD status) Mother died 61 from brain cancer Sister fine   Review of Systems: [y] = yes, [ ]  = no   General: Weight gain Blue.Reese ]; Weight loss [ ] ; Anorexia [ ] ; Fatigue [ ] ; Fever [ ] ; Chills [ ] ; Weakness [ ]   Cardiac: Chest pain/pressure [ ] ; Resting SOB [ ] ; Exertional SOB [ ] ; Orthopnea [ ] ; Pedal Edema [ ] ; Palpitations [ ] ; Syncope [ ] ; Presyncope [ ] ; Paroxysmal nocturnal dyspnea[ ]   Pulmonary: Cough [ ] ; Wheezing[ ] ; Hemoptysis[ ] ; Sputum [ ] ; Snoring [ ]   GI: Vomiting[ ] ; Dysphagia[ ] ; Melena[ ] ; Hematochezia [ ] ; Heartburn[ ] ; Abdominal pain [ ] ; Constipation [ ] ; Diarrhea [ ] ; BRBPR [ ]   GU: Hematuria[ ] ; Dysuria [ ] ; Nocturia[ ]   Vascular: Pain in legs with walking [ ] ; Pain in feet with lying flat [ ] ; Non-healing sores [ ] ; Stroke [ ] ; TIA [ ] ; Slurred speech [ ] ;  Neuro: Headaches[ ] ; Vertigo[ ] ; Seizures[ ] ; Paresthesias[ ] ;Blurred vision [ ] ; Diplopia [ ] ; Vision changes [ ]   Ortho/Skin: Arthritis  [ ] ; Joint pain [ ] ; Muscle pain [ ] ; Joint swelling [ ] ; Back Pain [ ] ; Rash [ ]   Psych: Depression[ ] ; Anxiety[ ]   Heme: Bleeding problems [ ] ; Clotting disorders [ ] ; Anemia [ ]   Endocrine: Diabetes [ ] ; Thyroid dysfunction[ ]    Past Medical History:  Diagnosis Date  . Allergy   . Anxiety   . History of gastrointestinal hemorrhage   . Hypertension   . Hypertriglyceridemia   . Macular degeneration    lasar surgery- Dr Zigmund Daniel  . Plantar warts   . PONV (postoperative nausea and vomiting)     Current Outpatient Prescriptions  Medication Sig Dispense Refill  . cetirizine (ZYRTEC) 10 MG tablet Take 10 mg by mouth daily.    Marland Kitchen PARoxetine (PAXIL-CR) 25 MG 24 hr tablet Take 25 mg by mouth daily.     No current facility-administered medications for this encounter.     No Known Allergies    Social History   Social History  . Marital status: Married    Spouse name: N/A  . Number of children: N/A  . Years of education: N/A   Occupational History  . Not on file.   Social History Main Topics  . Smoking status: Never Smoker  . Smokeless tobacco: Never Used  . Alcohol use 0.0 oz/week     Comment: social  . Drug use: No  . Sexual  activity: Not on file   Other Topics Concern  . 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       Family History  Problem Relation Age of Onset  . Hypertension Mother   . Bipolar disorder Mother   . Other Father     brain tumor  . Colon cancer Neg Hx   . Colon polyps Neg Hx   . Rectal cancer Neg Hx   . Stomach cancer Neg Hx     Vitals:   03/05/16 1014  BP: 134/90  Pulse: 83  SpO2: 99%  Weight: 235 lb 12 oz (106.9 kg)    PHYSICAL EXAM: General:  Well appearing. No respiratory difficulty HEENT: normal Neck: supple. no JVD. Carotids 2+ bilat; no bruits. No  lymphadenopathy or thryomegaly appreciated. Cor: PMI nondisplaced. Regular rate & rhythm. No rubs, gallops or murmurs. Lungs: clear Abdomen: obese soft, nontender, nondistended. No hepatosplenomegaly. No bruits or masses. Good bowel sounds. Extremities: no cyanosis, clubbing, rash, edema Neuro: alert & oriented x 3, cranial nerves grossly intact. moves all 4 extremities w/o difficulty. Affect pleasant.  ECG: Sinus brady 42 QRS 96 QTC 413ms (02/07/16)              NSR 76 QRS 80 QTc 459 No ST-T wave abnormalities. (today)   ASSESSMENT & PLAN: 1. Abnormal echo/new-onset cardiomyopathy     --EF 45% by echo in post-operative period at [facility]. No evidence of HF or ischemia clinically. Unclear if depressed EF is pathologic or not. Possibly transient in setting of post-op stress +/- sleep apnea    --Will cMRI to re-evaluate EF and for other possible etiologies.    --Check ETT to exclude CAD. Will also review outside chest CT looking for coronary calcium    --Will not start ACE/ARB/ARNI or b-blocker yet until EF rechecked  2. Sinus bradycardia    --I suspect related to hypoxia due to pain meds/post-op splinting and possible OSA.     --ECG now normal.     -_Refer for sleep study.   Bensimhon, Daniel,MD 11:57 AM

## 2016-03-06 ENCOUNTER — Encounter: Payer: Self-pay | Admitting: Internal Medicine

## 2016-03-25 DIAGNOSIS — C649 Malignant neoplasm of unspecified kidney, except renal pelvis: Secondary | ICD-10-CM | POA: Diagnosis not present

## 2016-03-25 DIAGNOSIS — R911 Solitary pulmonary nodule: Secondary | ICD-10-CM | POA: Diagnosis not present

## 2016-03-25 DIAGNOSIS — C641 Malignant neoplasm of right kidney, except renal pelvis: Secondary | ICD-10-CM | POA: Diagnosis not present

## 2016-03-25 DIAGNOSIS — Z79899 Other long term (current) drug therapy: Secondary | ICD-10-CM | POA: Diagnosis not present

## 2016-03-25 DIAGNOSIS — K769 Liver disease, unspecified: Secondary | ICD-10-CM | POA: Diagnosis not present

## 2016-03-25 DIAGNOSIS — R918 Other nonspecific abnormal finding of lung field: Secondary | ICD-10-CM | POA: Diagnosis not present

## 2016-03-26 DIAGNOSIS — D630 Anemia in neoplastic disease: Secondary | ICD-10-CM | POA: Diagnosis not present

## 2016-03-26 DIAGNOSIS — C7802 Secondary malignant neoplasm of left lung: Secondary | ICD-10-CM | POA: Diagnosis not present

## 2016-03-26 DIAGNOSIS — C801 Malignant (primary) neoplasm, unspecified: Secondary | ICD-10-CM | POA: Diagnosis not present

## 2016-03-26 DIAGNOSIS — C641 Malignant neoplasm of right kidney, except renal pelvis: Secondary | ICD-10-CM | POA: Diagnosis not present

## 2016-04-09 DIAGNOSIS — Z01818 Encounter for other preprocedural examination: Secondary | ICD-10-CM | POA: Diagnosis not present

## 2016-04-09 DIAGNOSIS — C649 Malignant neoplasm of unspecified kidney, except renal pelvis: Secondary | ICD-10-CM | POA: Diagnosis not present

## 2016-04-09 DIAGNOSIS — Z5111 Encounter for antineoplastic chemotherapy: Secondary | ICD-10-CM | POA: Diagnosis not present

## 2016-04-09 DIAGNOSIS — Z139 Encounter for screening, unspecified: Secondary | ICD-10-CM | POA: Diagnosis not present

## 2016-04-09 DIAGNOSIS — Z79899 Other long term (current) drug therapy: Secondary | ICD-10-CM | POA: Diagnosis not present

## 2016-04-10 DIAGNOSIS — C649 Malignant neoplasm of unspecified kidney, except renal pelvis: Secondary | ICD-10-CM | POA: Diagnosis not present

## 2016-04-11 ENCOUNTER — Encounter (HOSPITAL_COMMUNITY): Payer: Self-pay | Admitting: Internal Medicine

## 2016-04-11 DIAGNOSIS — C649 Malignant neoplasm of unspecified kidney, except renal pelvis: Secondary | ICD-10-CM | POA: Diagnosis not present

## 2016-04-11 DIAGNOSIS — I5189 Other ill-defined heart diseases: Secondary | ICD-10-CM | POA: Diagnosis not present

## 2016-04-11 DIAGNOSIS — D630 Anemia in neoplastic disease: Secondary | ICD-10-CM | POA: Diagnosis not present

## 2016-04-11 DIAGNOSIS — C801 Malignant (primary) neoplasm, unspecified: Secondary | ICD-10-CM | POA: Diagnosis not present

## 2016-04-11 DIAGNOSIS — F411 Generalized anxiety disorder: Secondary | ICD-10-CM | POA: Diagnosis not present

## 2016-04-12 ENCOUNTER — Telehealth (HOSPITAL_COMMUNITY): Payer: Self-pay

## 2016-04-12 NOTE — Telephone Encounter (Signed)
Patient at [facility] this week for clinical trial for renal cancer treatment.  Failed trail with MUGA results of 28% EF (needed 50% or greater). Dr. Kennetta Pavlovic Salon asking patient to follow up with Dr. Haroldine Laws before starting chemotherapy.  Patient was scheduled for the 30th. Dr. Haroldine Laws ok to see patient next week as add-on. Patient aware of appointment.  Will have CHF scheduler email office info/directions. Advised patient to call triage line or report to ED if he becomes symptomatic (SOB, CP, dizzy, presync/sync, AMS, etc). Patient aware and agreeable to plan as stated above.  Renee Pain, RN

## 2016-04-17 ENCOUNTER — Other Ambulatory Visit (HOSPITAL_COMMUNITY): Payer: Self-pay | Admitting: *Deleted

## 2016-04-17 ENCOUNTER — Ambulatory Visit (HOSPITAL_COMMUNITY)
Admission: RE | Admit: 2016-04-17 | Discharge: 2016-04-17 | Disposition: A | Discharge status: Home / Self Care | Payer: BLUE CROSS/BLUE SHIELD | Source: Ambulatory Visit | Attending: Internal Medicine | Admitting: Internal Medicine

## 2016-04-17 ENCOUNTER — Encounter (HOSPITAL_COMMUNITY): Payer: Self-pay | Admitting: Internal Medicine

## 2016-04-17 ENCOUNTER — Encounter (HOSPITAL_COMMUNITY): Payer: Self-pay | Admitting: *Deleted

## 2016-04-17 VITALS — BP 134/80 | HR 83 | Wt 249.8 lb

## 2016-04-17 DIAGNOSIS — C641 Malignant neoplasm of right kidney, except renal pelvis: Secondary | ICD-10-CM | POA: Diagnosis not present

## 2016-04-17 DIAGNOSIS — I5022 Chronic systolic (congestive) heart failure: Secondary | ICD-10-CM | POA: Diagnosis not present

## 2016-04-17 DIAGNOSIS — Z905 Acquired absence of kidney: Secondary | ICD-10-CM | POA: Diagnosis not present

## 2016-04-17 DIAGNOSIS — E781 Pure hyperglyceridemia: Secondary | ICD-10-CM | POA: Insufficient documentation

## 2016-04-17 DIAGNOSIS — F419 Anxiety disorder, unspecified: Secondary | ICD-10-CM | POA: Insufficient documentation

## 2016-04-17 DIAGNOSIS — I428 Other cardiomyopathies: Secondary | ICD-10-CM | POA: Diagnosis not present

## 2016-04-17 DIAGNOSIS — R079 Chest pain, unspecified: Secondary | ICD-10-CM | POA: Diagnosis not present

## 2016-04-17 DIAGNOSIS — R918 Other nonspecific abnormal finding of lung field: Secondary | ICD-10-CM | POA: Diagnosis not present

## 2016-04-17 DIAGNOSIS — Z79899 Other long term (current) drug therapy: Secondary | ICD-10-CM | POA: Insufficient documentation

## 2016-04-17 DIAGNOSIS — I110 Hypertensive heart disease with heart failure: Secondary | ICD-10-CM | POA: Diagnosis not present

## 2016-04-17 LAB — CBC
HCT: 40.3 % (ref 39.0–52.0)
HEMOGLOBIN: 13.6 g/dL (ref 13.0–17.0)
MCH: 28.5 pg (ref 26.0–34.0)
MCHC: 33.7 g/dL (ref 30.0–36.0)
MCV: 84.5 fL (ref 78.0–100.0)
Platelets: 193 10*3/uL (ref 150–400)
RBC: 4.77 MIL/uL (ref 4.22–5.81)
RDW: 18.4 % — ABNORMAL HIGH (ref 11.5–15.5)
WBC: 5.8 10*3/uL (ref 4.0–10.5)

## 2016-04-17 LAB — BASIC METABOLIC PANEL
ANION GAP: 7 (ref 5–15)
BUN: 27 mg/dL — ABNORMAL HIGH (ref 6–20)
CHLORIDE: 107 mmol/L (ref 101–111)
CO2: 25 mmol/L (ref 22–32)
Calcium: 9.1 mg/dL (ref 8.9–10.3)
Creatinine, Ser: 1.52 mg/dL — ABNORMAL HIGH (ref 0.61–1.24)
GFR calc non Af Amer: 50 mL/min — ABNORMAL LOW (ref >60)
GFR, EST AFRICAN AMERICAN: 57 mL/min — AB (ref >60)
Glucose, Bld: 92 mg/dL (ref 65–99)
Potassium: 4.2 mmol/L (ref 3.5–5.1)
Sodium: 139 mmol/L (ref 135–145)

## 2016-04-17 LAB — BRAIN NATRIURETIC PEPTIDE: B NATRIURETIC PEPTIDE 5: 186.5 pg/mL — AB (ref 0.0–100.0)

## 2016-04-17 MED ORDER — CARVEDILOL 3.125 MG PO TABS: 3.1250 mg | ORAL_TABLET | Freq: Two times a day (BID) | ORAL | 3 refills | Status: DC

## 2016-04-17 MED ORDER — LOSARTAN POTASSIUM 25 MG PO TABS: 25.0000 mg | ORAL_TABLET | Freq: Every day | ORAL | 3 refills | Status: DC

## 2016-04-17 NOTE — Progress Notes (Signed)
ADVANCED HF CLINIC NOTE  Referring Physician:  Lennie Hummer MD  Primary Cardiologist: None  HPI:  Gregory [name] is a 55 y/o male with h/o morbid obesity, R renal cell CA s/p R nephrectomy in 9/17 referred for further evaluation of newly-discovered cardiomyopathy.  Developed microscopic hematuria and found to have large right renal mass c/w renal cell CA. Eventually underwent right radical nephrectomy with adrenal sparing on 02/07/16 at [facility]. Post-op developed bradycardia with HRs in 30s while sleeping. BP stable. When awakened had dizziness so given atropine. ECG revealed sinus brady in 40s with mild QT prolongation which resolved. Echo done and EF 45% with global HK. RV dilated  With normal function .  We saw him for the first time in October 2017. At that time we ordered cMRI and stress test. However in the interim he went back to [facility]. Found to have 2 lung nodules and wanted to start chemo. Echo showed stable EF at 46%. However MUGA with EF 28% so referred back to [place] for further evaluation prior to chemo.   Says he snores intermittently. Denies daytime fatigue.  No significant ETOH use. Feels fine. No CP or DOE. No orthopnea or PND.   FHx:   Father died at 84 from HF (unknown CAD status) Mother died 12 from brain cancer Sister fine   Review of Systems: [y] = yes, [ ]  = no   General: Weight gain Blue.Reese ]; Weight loss [ ] ; Anorexia [ ] ; Fatigue [ ] ; Fever [ ] ; Chills [ ] ; Weakness [ ]   Cardiac: Chest pain/pressure [ ] ; Resting SOB [ ] ; Exertional SOB [ ] ; Orthopnea [ ] ; Pedal Edema [ ] ; Palpitations [ ] ; Syncope [ ] ; Presyncope [ ] ; Paroxysmal nocturnal dyspnea[ ]   Pulmonary: Cough [ ] ; Wheezing[ ] ; Hemoptysis[ ] ; Sputum [ ] ; Snoring [ ]   GI: Vomiting[ ] ; Dysphagia[ ] ; Melena[ ] ; Hematochezia [ ] ; Heartburn[ ] ; Abdominal pain [ ] ; Constipation [ ] ; Diarrhea [ ] ; BRBPR [ ]   GU: Hematuria[ ] ; Dysuria [ ] ; Nocturia[ ]   Vascular: Pain in legs with walking [ ] ; Pain in feet with  lying flat [ ] ; Non-healing sores [ ] ; Stroke [ ] ; TIA [ ] ; Slurred speech [ ] ;  Neuro: Headaches[ ] ; Vertigo[ ] ; Seizures[ ] ; Paresthesias[ ] ;Blurred vision [ ] ; Diplopia [ ] ; Vision changes [ ]   Ortho/Skin: Arthritis [ ] ; Joint pain [ ] ; Muscle pain [ ] ; Joint swelling [ ] ; Back Pain [ ] ; Rash [ ]   Psych: Depression[ ] ; Anxiety[ ]   Heme: Bleeding problems [ ] ; Clotting disorders [ ] ; Anemia [ ]   Endocrine: Diabetes [ ] ; Thyroid dysfunction[ ]    Past Medical History:  Diagnosis Date  . Allergy   . Anxiety   . History of gastrointestinal hemorrhage   . Hypertension   . Hypertriglyceridemia   . Macular degeneration    lasar surgery- Dr Zigmund Andie Mortimer  . Plantar warts   . PONV (postoperative nausea and vomiting)     Current Outpatient Prescriptions  Medication Sig Dispense Refill  . cetirizine (ZYRTEC) 10 MG tablet Take 10 mg by mouth daily.    Marland Kitchen PARoxetine (PAXIL-CR) 25 MG 24 hr tablet Take 25 mg by mouth daily.     No current facility-administered medications for this encounter.     No Known Allergies    Social History   Social History  . Marital status: Married    Spouse name: N/A  . Number of children: N/A  . Years of education: N/A  Occupational History  . Not on file.   Social History Main Topics  . Smoking status: Never Smoker  . Smokeless tobacco: Never Used  . Alcohol use 0.0 oz/week     Comment: social  . Drug use: No  . Sexual activity: Not on file   Other Topics Concern  . 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       Family History  Problem Relation Age of Onset  . Hypertension Mother   . Bipolar disorder Mother   . Other Father     brain tumor  . Colon cancer Neg Hx   . Colon polyps Neg Hx   . Rectal cancer Neg Hx   . Stomach cancer Neg Hx     Vitals:   04/17/16 1308    BP: 134/80  Pulse: 83  SpO2: 99%  Weight: 249 lb 12 oz (113.3 kg)    PHYSICAL EXAM: General:  Well appearing. No respiratory difficulty HEENT: normal Neck: supple. no JVD. Carotids 2+ bilat; no bruits. No lymphadenopathy or thryomegaly appreciated. Cor: PMI nondisplaced. Regular rate & rhythm. No rubs, gallops or murmurs. Lungs: clear Abdomen: obese soft, nontender, nondistended. No hepatosplenomegaly. No bruits or masses. Good bowel sounds. Extremities: no cyanosis, clubbing, rash, edema Neuro: alert & oriented x 3, cranial nerves grossly intact. moves all 4 extremities w/o difficulty. Affect pleasant.  ECG: Sinus brady 42 QRS 96 QTC 467ms (02/07/16)              NSR 76 QRS 80 QTc 459 No ST-T wave abnormalities. (today)   ASSESSMENT & PLAN: 1. Abnormal echo/new-onset cardiomyopathy     --EF 45%-> 28% by MUGA at [facility]. However EF stable at 45% by echo. I have reviewed MUGA from [facility] personally and I don't think EF is 28% ( I think it is better). We discussed options to further evaluate. Will proceed with cardiac cath this week to further assess EF and to rule out ischemic etiology for LV dysfunction. 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     --If coronaries normal and EF down can consider cMRI to assess for other possible etiologies.    --Start losartan and carvedilol after cath if EF down  2. Metastatic Renal Cell CA with lung nodules   --s/p nephrectomy. Awaiting chemo once cardiac issues stabilized.   3. Snoring   --sleep study pending 11/27  Tavarious Freel,MD 1:19 PM

## 2016-04-17 NOTE — Patient Instructions (Signed)
Start Carvedilol 3.125 mg Twice daily   Start Losartan 25 mg daily  Your physician has requested that you have a cardiac catheterization. Cardiac catheterization is used to diagnose and/or treat various heart conditions. Doctors may recommend this procedure for a number of different reasons. The most common reason is to evaluate chest pain. Chest pain can be a symptom of coronary artery disease (CAD), and cardiac catheterization can show whether plaque is narrowing or blocking your heart's arteries. This procedure is also used to evaluate the valves, as well as measure the blood flow and oxygen levels in different parts of your heart. For further information please visit [email or web address]. Please follow instruction sheet, as given.  Friday 11/24, SEE INSTRUCTION SHEET  Your physician recommends that you schedule a follow-up appointment in: 6 weeks

## 2016-04-19 ENCOUNTER — Ambulatory Visit (HOSPITAL_COMMUNITY)
Admission: RE | Admit: 2016-04-19 | Discharge: 2016-04-19 | Disposition: A | Discharge status: Home / Self Care | Payer: BLUE CROSS/BLUE SHIELD | Source: Ambulatory Visit | Attending: Internal Medicine | Admitting: Internal Medicine

## 2016-04-19 ENCOUNTER — Encounter (HOSPITAL_COMMUNITY)
Admission: RE | Disposition: A | Discharge status: Home / Self Care | Payer: Self-pay | Source: Ambulatory Visit | Attending: Internal Medicine

## 2016-04-19 ENCOUNTER — Encounter (HOSPITAL_COMMUNITY): Payer: Self-pay | Admitting: Internal Medicine

## 2016-04-19 DIAGNOSIS — Z6832 Body mass index (BMI) 32.0-32.9, adult: Secondary | ICD-10-CM | POA: Insufficient documentation

## 2016-04-19 DIAGNOSIS — I420 Dilated cardiomyopathy: Secondary | ICD-10-CM | POA: Diagnosis not present

## 2016-04-19 DIAGNOSIS — C649 Malignant neoplasm of unspecified kidney, except renal pelvis: Secondary | ICD-10-CM | POA: Diagnosis not present

## 2016-04-19 DIAGNOSIS — I110 Hypertensive heart disease with heart failure: Secondary | ICD-10-CM | POA: Diagnosis not present

## 2016-04-19 DIAGNOSIS — I2510 Atherosclerotic heart disease of native coronary artery without angina pectoris: Secondary | ICD-10-CM

## 2016-04-19 DIAGNOSIS — F419 Anxiety disorder, unspecified: Secondary | ICD-10-CM | POA: Diagnosis not present

## 2016-04-19 DIAGNOSIS — R918 Other nonspecific abnormal finding of lung field: Secondary | ICD-10-CM | POA: Diagnosis not present

## 2016-04-19 DIAGNOSIS — E781 Pure hyperglyceridemia: Secondary | ICD-10-CM | POA: Insufficient documentation

## 2016-04-19 DIAGNOSIS — Z79899 Other long term (current) drug therapy: Secondary | ICD-10-CM | POA: Diagnosis not present

## 2016-04-19 DIAGNOSIS — I5022 Chronic systolic (congestive) heart failure: Secondary | ICD-10-CM | POA: Diagnosis not present

## 2016-04-19 DIAGNOSIS — Z8249 Family history of ischemic heart disease and other diseases of the circulatory system: Secondary | ICD-10-CM | POA: Insufficient documentation

## 2016-04-19 DIAGNOSIS — R0683 Snoring: Secondary | ICD-10-CM | POA: Diagnosis not present

## 2016-04-19 DIAGNOSIS — I429 Cardiomyopathy, unspecified: Secondary | ICD-10-CM | POA: Diagnosis not present

## 2016-04-19 HISTORY — PX: CARDIAC CATHETERIZATION: SHX172

## 2016-04-19 LAB — PROTIME-INR
INR: 0.99
Prothrombin Time: 13.1 seconds (ref 11.4–15.2)

## 2016-04-19 SURGERY — LEFT HEART CATH AND CORONARY ANGIOGRAPHY
Anesthesia: LOCAL

## 2016-04-19 MED ORDER — ASPIRIN 81 MG PO CHEW
81.0000 mg | CHEWABLE_TABLET | ORAL | Status: AC
  Administered 2016-04-19: 81 mg via ORAL

## 2016-04-19 MED ORDER — LIDOCAINE HCL (PF) 1 % IJ SOLN
INTRAMUSCULAR | Status: AC
  Filled 2016-04-19: qty 30

## 2016-04-19 MED ORDER — VERAPAMIL HCL 2.5 MG/ML IV SOLN
INTRAVENOUS | Status: AC
  Filled 2016-04-19: qty 2

## 2016-04-19 MED ORDER — LIDOCAINE HCL (PF) 1 % IJ SOLN
INTRAMUSCULAR | Status: DC | PRN
  Administered 2016-04-19: 2 mL via INTRADERMAL

## 2016-04-19 MED ORDER — FENTANYL CITRATE (PF) 100 MCG/2ML IJ SOLN
INTRAMUSCULAR | Status: AC
  Filled 2016-04-19: qty 2

## 2016-04-19 MED ORDER — HEPARIN SODIUM (PORCINE) 1000 UNIT/ML IJ SOLN
INTRAMUSCULAR | Status: DC | PRN
  Administered 2016-04-19: 5500 [IU] via INTRAVENOUS

## 2016-04-19 MED ORDER — MIDAZOLAM HCL 2 MG/2ML IJ SOLN
INTRAMUSCULAR | Status: AC
  Filled 2016-04-19: qty 2

## 2016-04-19 MED ORDER — SODIUM CHLORIDE 0.9 % IV SOLN: 250.0000 mL | INTRAVENOUS | Status: DC | PRN

## 2016-04-19 MED ORDER — FENTANYL CITRATE (PF) 100 MCG/2ML IJ SOLN
INTRAMUSCULAR | Status: DC | PRN
  Administered 2016-04-19 (×2): 25 ug via INTRAVENOUS

## 2016-04-19 MED ORDER — SODIUM CHLORIDE 0.9% FLUSH: 3.0000 mL | Freq: Two times a day (BID) | INTRAVENOUS | Status: DC

## 2016-04-19 MED ORDER — HEPARIN (PORCINE) IN NACL 2-0.9 UNIT/ML-% IJ SOLN
INTRAMUSCULAR | Status: AC
Start: 2016-04-19 — End: 2016-04-19
  Filled 2016-04-19: qty 1500

## 2016-04-19 MED ORDER — NITROGLYCERIN 1 MG/10 ML FOR IR/CATH LAB
INTRA_ARTERIAL | Status: AC
  Filled 2016-04-19: qty 10

## 2016-04-19 MED ORDER — IOPAMIDOL (ISOVUE-370) INJECTION 76%
INTRAVENOUS | Status: AC
  Filled 2016-04-19: qty 100

## 2016-04-19 MED ORDER — ASPIRIN 81 MG PO CHEW
CHEWABLE_TABLET | ORAL | Status: AC
  Filled 2016-04-19: qty 1

## 2016-04-19 MED ORDER — HEPARIN SODIUM (PORCINE) 1000 UNIT/ML IJ SOLN
INTRAMUSCULAR | Status: AC
  Filled 2016-04-19: qty 1

## 2016-04-19 MED ORDER — MIDAZOLAM HCL 2 MG/2ML IJ SOLN
INTRAMUSCULAR | Status: DC | PRN
  Administered 2016-04-19: 1 mg via INTRAVENOUS
  Administered 2016-04-19: 2 mg via INTRAVENOUS

## 2016-04-19 MED ORDER — HEPARIN (PORCINE) IN NACL 2-0.9 UNIT/ML-% IJ SOLN
INTRAMUSCULAR | Status: DC | PRN
  Administered 2016-04-19: 1000 mL

## 2016-04-19 MED ORDER — SODIUM CHLORIDE 0.9% FLUSH: 3.0000 mL | INTRAVENOUS | Status: DC | PRN

## 2016-04-19 MED ORDER — VERAPAMIL HCL 2.5 MG/ML IV SOLN
INTRAVENOUS | Status: DC | PRN
  Administered 2016-04-19: 10 mL via INTRA_ARTERIAL

## 2016-04-19 MED ORDER — NITROGLYCERIN 1 MG/10 ML FOR IR/CATH LAB
INTRA_ARTERIAL | Status: DC | PRN
  Administered 2016-04-19: 200 ug via INTRA_ARTERIAL

## 2016-04-19 MED ORDER — SODIUM CHLORIDE 0.9 % IV SOLN
INTRAVENOUS | Status: DC
  Administered 2016-04-19: 08:00:00 via INTRAVENOUS

## 2016-04-19 SURGICAL SUPPLY — 12 items
CATH INFINITI 4FR JL3.5 (CATHETERS) ×1 IMPLANT
CATH INFINITI JR4 5F (CATHETERS) ×1 IMPLANT
CATH INFINITI MULTIPACK ANG 4F (CATHETERS) ×1 IMPLANT
DEVICE RAD COMP TR BAND LRG (VASCULAR PRODUCTS) ×1 IMPLANT
GLIDESHEATH SLEND SS 6F .021 (SHEATH) ×1 IMPLANT
GUIDEWIRE INQWIRE 1.5J.035X260 (WIRE) IMPLANT
INQWIRE 1.5J .035X260CM (WIRE) ×2
KIT HEART LEFT (KITS) ×2 IMPLANT
PACK CARDIAC CATHETERIZATION (CUSTOM PROCEDURE TRAY) ×2 IMPLANT
TRANSDUCER W/STOPCOCK (MISCELLANEOUS) ×2 IMPLANT
TUBING CIL FLEX 10 FLL-RA (TUBING) ×2 IMPLANT
WIRE HI TORQ VERSACORE-J 145CM (WIRE) ×1 IMPLANT

## 2016-04-19 NOTE — Interval H&P Note (Signed)
History and Physical Interval Note:  04/19/2016 8:07 AM  Gregory [name]  has presented today for surgery, with the diagnosis of chf  The various methods of treatment have been discussed with the patient and family. After consideration of risks, benefits and other options for treatment, the patient has consented to  Procedure(s): Left Heart Cath and Coronary Angiography (N/A) and possible coronary angioplasty as a surgical intervention .  The patient's history has been reviewed, patient examined, no change in status, stable for surgery.  I have reviewed the patient's chart and labs.  Questions were answered to the patient's satisfaction.    Cath Lab Visit (complete for each Cath Lab visit)  Clinical Evaluation Leading to the Procedure:   ACS: No.  Non-ACS:    Anginal Classification: CCS II  Anti-ischemic medical therapy: Minimal Therapy (1 class of medications)  Non-Invasive Test Results: No non-invasive testing performed  Prior CABG: No previous CABG        Gregory [name], Quillian Quince

## 2016-04-19 NOTE — H&P (View-Only) (Signed)
ADVANCED HF CLINIC NOTE  Referring Physician:  Lennie Hummer MD  Primary Cardiologist: None  HPI:  Gregory [name] is a 55 y/o male with h/o morbid obesity, R renal cell CA s/p R nephrectomy in 9/17 referred for further evaluation of newly-discovered cardiomyopathy.  Developed microscopic hematuria and found to have large right renal mass c/w renal cell CA. Eventually underwent right radical nephrectomy with adrenal sparing on 02/07/16 at [facility]. Post-op developed bradycardia with HRs in 30s while sleeping. BP stable. When awakened had dizziness so given atropine. ECG revealed sinus brady in 40s with mild QT prolongation which resolved. Echo done and EF 45% with global HK. RV dilated  With normal function .  We saw him for the first time in October 2017. At that time we ordered cMRI and stress test. However in the interim he went back to [facility]. Found to have 2 lung nodules and wanted to start chemo. Echo showed stable EF at 46%. However MUGA with EF 28% so referred back to [place] for further evaluation prior to chemo.   Says he snores intermittently. Denies daytime fatigue.  No significant ETOH use. Feels fine. No CP or DOE. No orthopnea or PND.   FHx:   Father died at 36 from HF (unknown CAD status) Mother died 42 from brain cancer Sister fine   Review of Systems: [y] = yes, [ ]  = no   General: Weight gain Blue.Reese ]; Weight loss [ ] ; Anorexia [ ] ; Fatigue [ ] ; Fever [ ] ; Chills [ ] ; Weakness [ ]   Cardiac: Chest pain/pressure [ ] ; Resting SOB [ ] ; Exertional SOB [ ] ; Orthopnea [ ] ; Pedal Edema [ ] ; Palpitations [ ] ; Syncope [ ] ; Presyncope [ ] ; Paroxysmal nocturnal dyspnea[ ]   Pulmonary: Cough [ ] ; Wheezing[ ] ; Hemoptysis[ ] ; Sputum [ ] ; Snoring [ ]   GI: Vomiting[ ] ; Dysphagia[ ] ; Melena[ ] ; Hematochezia [ ] ; Heartburn[ ] ; Abdominal pain [ ] ; Constipation [ ] ; Diarrhea [ ] ; BRBPR [ ]   GU: Hematuria[ ] ; Dysuria [ ] ; Nocturia[ ]   Vascular: Pain in legs with walking [ ] ; Pain in feet with  lying flat [ ] ; Non-healing sores [ ] ; Stroke [ ] ; TIA [ ] ; Slurred speech [ ] ;  Neuro: Headaches[ ] ; Vertigo[ ] ; Seizures[ ] ; Paresthesias[ ] ;Blurred vision [ ] ; Diplopia [ ] ; Vision changes [ ]   Ortho/Skin: Arthritis [ ] ; Joint pain [ ] ; Muscle pain [ ] ; Joint swelling [ ] ; Back Pain [ ] ; Rash [ ]   Psych: Depression[ ] ; Anxiety[ ]   Heme: Bleeding problems [ ] ; Clotting disorders [ ] ; Anemia [ ]   Endocrine: Diabetes [ ] ; Thyroid dysfunction[ ]    Past Medical History:  Diagnosis Date  . Allergy   . Anxiety   . History of gastrointestinal hemorrhage   . Hypertension   . Hypertriglyceridemia   . Macular degeneration    lasar surgery- Dr Zigmund Babygirl Trager  . Plantar warts   . PONV (postoperative nausea and vomiting)     Current Outpatient Prescriptions  Medication Sig Dispense Refill  . cetirizine (ZYRTEC) 10 MG tablet Take 10 mg by mouth daily.    Marland Kitchen PARoxetine (PAXIL-CR) 25 MG 24 hr tablet Take 25 mg by mouth daily.     No current facility-administered medications for this encounter.     No Known Allergies    Social History   Social History  . Marital status: Married    Spouse name: N/A  . Number of children: N/A  . Years of education: N/A  Occupational History  . Not on file.   Social History Main Topics  . Smoking status: Never Smoker  . Smokeless tobacco: Never Used  . Alcohol use 0.0 oz/week     Comment: social  . Drug use: No  . Sexual activity: Not on file   Other Topics Concern  . 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       Family History  Problem Relation Age of Onset  . Hypertension Mother   . Bipolar disorder Mother   . Other Father     brain tumor  . Colon cancer Neg Hx   . Colon polyps Neg Hx   . Rectal cancer Neg Hx   . Stomach cancer Neg Hx     Vitals:   04/17/16 1308    BP: 134/80  Pulse: 83  SpO2: 99%  Weight: 249 lb 12 oz (113.3 kg)    PHYSICAL EXAM: General:  Well appearing. No respiratory difficulty HEENT: normal Neck: supple. no JVD. Carotids 2+ bilat; no bruits. No lymphadenopathy or thryomegaly appreciated. Cor: PMI nondisplaced. Regular rate & rhythm. No rubs, gallops or murmurs. Lungs: clear Abdomen: obese soft, nontender, nondistended. No hepatosplenomegaly. No bruits or masses. Good bowel sounds. Extremities: no cyanosis, clubbing, rash, edema Neuro: alert & oriented x 3, cranial nerves grossly intact. moves all 4 extremities w/o difficulty. Affect pleasant.  ECG: Sinus brady 42 QRS 96 QTC 446ms (02/07/16)              NSR 76 QRS 80 QTc 459 No ST-T wave abnormalities. (today)   ASSESSMENT & PLAN: 1. Abnormal echo/new-onset cardiomyopathy     --EF 45%-> 28% by MUGA at [facility]. However EF stable at 45% by echo. I have reviewed MUGA from [facility] personally and I don't think EF is 28% ( I think it is better). We discussed options to further evaluate. Will proceed with cardiac cath this week to further assess EF and to rule out ischemic etiology for LV dysfunction. 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     --If coronaries normal and EF down can consider cMRI to assess for other possible etiologies.    --Start losartan and carvedilol after cath if EF down  2. Metastatic Renal Cell CA with lung nodules   --s/p nephrectomy. Awaiting chemo once cardiac issues stabilized.   3. Snoring   --sleep study pending 11/27  Gianny Killman,MD 1:19 PM

## 2016-04-19 NOTE — Discharge Instructions (Signed)

## 2016-04-22 ENCOUNTER — Ambulatory Visit (HOSPITAL_BASED_OUTPATIENT_CLINIC_OR_DEPARTMENT_OTHER): Payer: BLUE CROSS/BLUE SHIELD | Attending: Internal Medicine | Admitting: Cardiology

## 2016-04-22 ENCOUNTER — Telehealth (HOSPITAL_COMMUNITY): Payer: Self-pay | Admitting: Cardiology

## 2016-04-22 DIAGNOSIS — R931 Abnormal findings on diagnostic imaging of heart and coronary circulation: Secondary | ICD-10-CM

## 2016-04-22 DIAGNOSIS — I10 Essential (primary) hypertension: Secondary | ICD-10-CM | POA: Diagnosis not present

## 2016-04-22 DIAGNOSIS — Z6832 Body mass index (BMI) 32.0-32.9, adult: Secondary | ICD-10-CM | POA: Insufficient documentation

## 2016-04-22 DIAGNOSIS — R0683 Snoring: Secondary | ICD-10-CM | POA: Diagnosis not present

## 2016-04-22 DIAGNOSIS — I493 Ventricular premature depolarization: Secondary | ICD-10-CM | POA: Diagnosis not present

## 2016-04-22 DIAGNOSIS — E669 Obesity, unspecified: Secondary | ICD-10-CM | POA: Diagnosis not present

## 2016-04-22 NOTE — Telephone Encounter (Signed)
Multiple concerns regarding chemo treatment, cath results, and further testing. Patient to meet with oncologist in [place], should we add on to review results in detail?

## 2016-04-24 NOTE — Telephone Encounter (Signed)
No need to do that. I can call him. I told him we would schedule a cardiac MRI.

## 2016-04-25 ENCOUNTER — Encounter (HOSPITAL_COMMUNITY): Payer: BLUE CROSS/BLUE SHIELD | Admitting: Internal Medicine

## 2016-04-30 ENCOUNTER — Telehealth (HOSPITAL_COMMUNITY): Payer: Self-pay

## 2016-04-30 NOTE — Telephone Encounter (Signed)
Patient's sister calling CHF clinic triage to inquire about speaking with Dr. Haroldine Laws.  States he is due to fly out to [facility] [place] in [place] in the morning but is unsure if this is necessary as he msut be cleared for chemo by Dr. Haroldine Laws first and has not heard anything. Notification sent to Dr. Haroldine Laws about this and asked to call patient directly.  Renee Pain, RN

## 2016-05-01 DIAGNOSIS — F411 Generalized anxiety disorder: Secondary | ICD-10-CM | POA: Diagnosis not present

## 2016-05-01 DIAGNOSIS — D630 Anemia in neoplastic disease: Secondary | ICD-10-CM | POA: Diagnosis not present

## 2016-05-01 DIAGNOSIS — C649 Malignant neoplasm of unspecified kidney, except renal pelvis: Secondary | ICD-10-CM | POA: Diagnosis not present

## 2016-05-02 DIAGNOSIS — F411 Generalized anxiety disorder: Secondary | ICD-10-CM | POA: Diagnosis not present

## 2016-05-02 DIAGNOSIS — I428 Other cardiomyopathies: Secondary | ICD-10-CM | POA: Diagnosis not present

## 2016-05-02 DIAGNOSIS — D630 Anemia in neoplastic disease: Secondary | ICD-10-CM | POA: Diagnosis not present

## 2016-05-02 DIAGNOSIS — C649 Malignant neoplasm of unspecified kidney, except renal pelvis: Secondary | ICD-10-CM | POA: Diagnosis not present

## 2016-05-12 NOTE — Procedures (Signed)
   Patient Name: Gregory [name], Gregory [name] Date: 04/22/2016 Gender: Male D.O.B: November 05, 1960 Age (years): 55 Referring Provider: Shaune Pascal Bensimhon Height (inches): 53 Interpreting Physician: Fransico Him MD, ABSM Weight (lbs): 249 RPSGT: Carolin Coy BMI: 32 MRN: [id number] Neck Size: 15.50  CLINICAL INFORMATION Sleep Study Type: NPSG  Indication for sleep study: Hypertension, Obesity, Snoring  Epworth Sleepiness Score: 4  SLEEP STUDY TECHNIQUE As per the AASM Manual for the Scoring of Sleep and Associated Events v2.3 (April 2016) with a hypopnea requiring 4% desaturations.  The channels recorded and monitored were frontal, central and occipital EEG, electrooculogram (EOG), submentalis EMG (chin), nasal and oral airflow, thoracic and abdominal wall motion, anterior tibialis EMG, snore microphone, electrocardiogram, and pulse oximetry.  MEDICATIONS Medications self-administered by patient taken the night of the study : N/A  SLEEP ARCHITECTURE The study was initiated at 10:25:45 PM and ended at 4:51:40 AM.  Sleep onset time was 11.7 minutes and the sleep efficiency was 82.9%. The total sleep time was 320.0 minutes.  Stage REM latency was 169.5 minutes.  The patient spent 15.47% of the night in stage N1 sleep, 71.56% in stage N2 sleep, 0.00% in stage N3 and 12.97% in REM.  Alpha intrusion was absent.  Supine sleep was 4.21%.  RESPIRATORY PARAMETERS The overall apnea/hypopnea index (AHI) was 2.8 per hour. There were 5 total apneas, including 2 obstructive, 3 central and 0 mixed apneas. There were 10 hypopneas and 25 RERAs.  The AHI during Stage REM sleep was 10.1 per hour.  AHI while supine was 0.0 per hour.  The mean oxygen saturation was 93.64%. The minimum SpO2 during sleep was 88.00%.  Moderate snoring was noted during this study.  CARDIAC DATA The 2 lead EKG demonstrated sinus rhythm. The mean heart rate was 71.57 beats per minute. Other EKG findings include:  PVCs.  LEG MOVEMENT DATA The total PLMS were 18 with a resulting PLMS index of 3.38. Associated arousal with leg movement index was 1.1 .  IMPRESSIONS - No significant obstructive sleep apnea occurred during this study (AHI = 2.8/h). - No significant central sleep apnea occurred during this study (CAI = 0.6/h). - The patient had minimal or no oxygen desaturation during the study (Min O2 = 88.00%) - The patient snored with Moderate snoring volume. - EKG findings include PVCs. - Clinically significant periodic limb movements did not occur during sleep. No significant associated arousals.  DIAGNOSIS - Normal study - PVCs  RECOMMENDATIONS - Avoid alcohol, sedatives and other CNS depressants that may worsen sleep apnea and disrupt normal sleep architecture. - Sleep hygiene should be reviewed to assess factors that may improve sleep quality. - Weight management and regular exercise should be initiated or continued if appropriate.  Echo, American Board of Sleep Medicine  ELECTRONICALLY SIGNED ON:  05/12/2016, 6:56 PM Sam Rayburn PH: [phone]   FX: [phone] [place]

## 2016-05-13 NOTE — Progress Notes (Signed)
Spoke to the patient gave him his results and he verbalized understanding 

## 2016-05-16 ENCOUNTER — Encounter: Payer: Self-pay | Admitting: *Deleted

## 2016-05-17 ENCOUNTER — Telehealth: Payer: Self-pay | Admitting: Oncology

## 2016-05-17 NOTE — Telephone Encounter (Signed)
Faxed medical records request to [facility] [place]

## 2016-05-23 ENCOUNTER — Ambulatory Visit (HOSPITAL_COMMUNITY): Payer: BLUE CROSS/BLUE SHIELD

## 2016-05-23 ENCOUNTER — Encounter: Payer: Self-pay | Admitting: Internal Medicine

## 2016-05-23 ENCOUNTER — Telehealth: Payer: Self-pay | Admitting: Internal Medicine

## 2016-05-23 ENCOUNTER — Encounter (HOSPITAL_COMMUNITY): Payer: Self-pay | Admitting: Internal Medicine

## 2016-05-23 NOTE — Telephone Encounter (Signed)
Called patient back and gave him a 3 p.m., appointment today.  The patient said that he could not take that appointment today as his daughter is having a debutante ball and he has to take the pictures.  I told him that there probably will not be an opening until after the first of the year.  He asked if there were any openings tomorrow he could make it.

## 2016-05-23 NOTE — Telephone Encounter (Signed)
Patient called inquiring as to his cardiac MRI appointment.  I told him I did not schedule the MRI appt that the MRI technician schedules it.  I told him I would call the technician and see if she could squeeze him in before the end of the year.

## 2016-05-24 ENCOUNTER — Encounter: Payer: Self-pay | Admitting: *Deleted

## 2016-05-24 ENCOUNTER — Telehealth: Payer: Self-pay | Admitting: Oncology

## 2016-05-24 NOTE — Telephone Encounter (Signed)
Followed up with [facility] [place].  Refaxed medical records request [place]

## 2016-05-28 ENCOUNTER — Encounter (HOSPITAL_COMMUNITY): Payer: Self-pay | Admitting: Internal Medicine

## 2016-05-29 ENCOUNTER — Ambulatory Visit (HOSPITAL_BASED_OUTPATIENT_CLINIC_OR_DEPARTMENT_OTHER): Payer: BLUE CROSS/BLUE SHIELD | Admitting: Oncology

## 2016-05-29 ENCOUNTER — Telehealth: Payer: Self-pay | Admitting: Oncology

## 2016-05-29 ENCOUNTER — Ambulatory Visit (HOSPITAL_COMMUNITY)
Admission: RE | Admit: 2016-05-29 | Discharge: 2016-05-29 | Disposition: A | Discharge status: Home / Self Care | Payer: BLUE CROSS/BLUE SHIELD | Source: Ambulatory Visit | Attending: Internal Medicine | Admitting: Internal Medicine

## 2016-05-29 VITALS — BP 123/85 | HR 91 | Temp 98.4°F | Resp 16 | Ht 74.0 in | Wt 256.3 lb

## 2016-05-29 DIAGNOSIS — I429 Cardiomyopathy, unspecified: Secondary | ICD-10-CM

## 2016-05-29 DIAGNOSIS — I10 Essential (primary) hypertension: Secondary | ICD-10-CM

## 2016-05-29 DIAGNOSIS — C649 Malignant neoplasm of unspecified kidney, except renal pelvis: Secondary | ICD-10-CM

## 2016-05-29 DIAGNOSIS — C641 Malignant neoplasm of right kidney, except renal pelvis: Secondary | ICD-10-CM | POA: Diagnosis not present

## 2016-05-29 DIAGNOSIS — E669 Obesity, unspecified: Secondary | ICD-10-CM

## 2016-05-29 DIAGNOSIS — R931 Abnormal findings on diagnostic imaging of heart and coronary circulation: Secondary | ICD-10-CM | POA: Insufficient documentation

## 2016-05-29 DIAGNOSIS — R911 Solitary pulmonary nodule: Secondary | ICD-10-CM

## 2016-05-29 DIAGNOSIS — I340 Nonrheumatic mitral (valve) insufficiency: Secondary | ICD-10-CM | POA: Insufficient documentation

## 2016-05-29 DIAGNOSIS — Z7189 Other specified counseling: Secondary | ICD-10-CM | POA: Insufficient documentation

## 2016-05-29 LAB — CREATININE, SERUM
Creatinine, Ser: 1.38 mg/dL — ABNORMAL HIGH (ref 0.61–1.24)
GFR calc Af Amer: >60 mL/min (ref >60)
GFR calc non Af Amer: 56 mL/min — ABNORMAL LOW (ref >60)

## 2016-05-29 MED ORDER — GADOBENATE DIMEGLUMINE 529 MG/ML IV SOLN
35.0000 mL | Freq: Once | INTRAVENOUS | Status: AC | PRN
  Administered 2016-05-29: 35 mL via INTRAVENOUS

## 2016-05-29 NOTE — Telephone Encounter (Signed)
Gave patient avs report and appointments for January and February  °

## 2016-05-29 NOTE — Progress Notes (Signed)
DISCONTINUE ON PATHWAY REGIMEN - Renal Cell  No Medical Intervention - Off Treatment.  REASON: Disease Progression PRIOR TREATMENT: RC3: Observation  START OFF PATHWAY REGIMEN - Renal Cell  Off Pathway: Nivolumab 240 mg q14 Days  [id number]:Nivolumab 240 mg q14 Days:   A cycle is every 14 days:     Nivolumab (Opdivo(R)) 240 mg flat dose in 100 mL NS IV over 60 minutes. Inline filter required (low protein binding) Dose Mod: None Additional Orders: Severe immune-mediated reactions can occur (e.g. pneumonitis, colitis, and hepatitis). See prescribing information for more details including monitoring and required immediate management with steroids. Monitor thyroid, renal, liver  function tests, glucose, and sodium at baseline and periodically during therapy.  **Always confirm dose/schedule in your pharmacy ordering system**    Patient Characteristics: Metastatic, Clear Cell, First Line AJCC Stage Grouping: IV Current evidence of distant metastases? Yes AJCC T Stage: X AJCC N Stage: X AJCC M Stage: X Does patient have oligometastatic disease? No Would you be surprised if this patient died  in the next year? I would be surprised if this patient died in the next year Histology: Clear Cell Line of therapy: First Line  Intent of Therapy: Non-Curative / Palliative Intent, Discussed with Patient

## 2016-05-29 NOTE — Progress Notes (Signed)
Renal Cell - No Medical Intervention - Off Treatment.  Patient Characteristics: Metastatic, Oligometastatic Disease AJCC Stage Grouping: IV Current evidence of distant metastases? Yes AJCC T Stage: X AJCC N Stage: X AJCC M Stage: X Does patient have oligometastatic disease? Yes Would you be surprised if this patient died  in the next year? I would be surprised if this patient died in the next year

## 2016-05-29 NOTE — Progress Notes (Signed)
Reason for Referral: Renal cell carcinoma.   HPI: 56 year old gentleman native of state of [place] but currently lives in [place]. He is a gentleman with past medical history significant for obesity, hypertension but for the most part and reasonably good health. He started developing symptoms of excessive fatigue, tiredness and was found to be anemic and felt it was related to iron deficiency. He received IV iron infusion but continues to have symptoms of excessive fatigue tiredness and abdominal pain. He waited initially in [place] because of family presence in that area. His evaluation showed large kidney mass on the right side measuring close to 10 cm. No clear-cut evidence of metastasis was noted. He was evaluated and [facility] and underwent a radical nephrectomy on 02/07/2016. The final pathology revealed a renal cell carcinoma, clear cell type measuring 9.9 x 9.0 x 9.0 cm. His Fuhrman grade was 4 with lymphovascular invasion and invasion into the segmental branches of the renal vein. Postoperatively, he developed bradycardia and his cardiac evaluation revealed cardiomyopathy with depressed EF.  He recovered well from his operation and had been followed by Dr. Megan Salon at [facility]. He is a repeat imaging studies of the chest showed lower lobe pulmonary nodules consistent with metastatic disease. He was workup did not reveal any evidence of metastasis anywhere else including MRI of the brain, bone scan and CT scan images of the abdomen. He was evaluated for possible clinical trials as well as potentially starting on VGEF TKI (Sutent or Votrient).  MUGA scan which showed cardiomyopathy with an EF that is 20-30%. Based on these findings, it was recommended that he starts Nivolumab instead. For logistical purposes, he elected to proceed with treatment locally.  ,Clinically, he reports doing very well. He is no longer reporting any symptoms. He denied any flank pain, chest pain or  difficulty breathing. He has regained all activities of daily living and continues to work full time. He denied any dyspnea on exertion or lower extremity edema. He does not report any hematuria or dysuria.  He does not report any headaches or blurry vision, syncope or seizures. He does not report any fevers, chills, sweats or weight loss. His appetite is excellent and have gained weight. He does not report any chest pain, palpitation, orthopnea or leg edema. He does not report any cough, wheezing or hemoptysis. He does not report any nausea, vomiting or abdominal pain. He does not report any frequency urgency or hesitancy. Remaining review of systems unremarkable.   Past Medical History:  Diagnosis Date  . Allergy   . Anxiety   . History of gastrointestinal hemorrhage   . Hypertension   . Hypertriglyceridemia   . Macular degeneration    lasar surgery- Dr Zigmund Daniel  . Plantar warts   . PONV (postoperative nausea and vomiting)   :  Past Surgical History:  Procedure Laterality Date  . CARDIAC CATHETERIZATION N/A 04/19/2016   Procedure: Left Heart Cath and Coronary Angiography;  Surgeon: Jolaine Artist, MD;  Location: [facility];  Service: Cardiovascular;  Laterality: N/A;  . COLONOSCOPY  03/22/2005  . COLOSTOMY    . EYE SURGERY Right    laser surgery, right eye macular degeneration  . orif fx fibula    . sebaceous cyst excision  1985  . TONSILLECTOMY    . TOTAL SHOULDER ARTHROPLASTY Left 11/11/2014   Procedure: LEFT TOTAL SHOULDER ARTHROPLASTY;  Surgeon: Netta Cedars, MD;  Location: [facility];  Service: Orthopedics;  Laterality: Left;  Marland Kitchen VASECTOMY    :  Current Outpatient Prescriptions:  .  carvedilol (COREG) 3.125 MG tablet, Take 1 tablet (3.125 mg total) by mouth 2 (two) times daily., Disp: 60 tablet, Rfl: 3 .  cetirizine (ZYRTEC) 10 MG tablet, Take 10 mg by mouth daily., Disp: , Rfl:  .  cyanocobalamin 500 MCG tablet, Take 500 mcg by mouth daily., Disp: , Rfl:  .   losartan (COZAAR) 25 MG tablet, Take 1 tablet (25 mg total) by mouth daily., Disp: 30 tablet, Rfl: 3 .  PARoxetine (PAXIL-CR) 25 MG 24 hr tablet, Take 25 mg by mouth daily., Disp: , Rfl: :  No Known Allergies:  Family History  Problem Relation Age of Onset  . Hypertension Mother   . Bipolar disorder Mother   . Other Father     brain tumor  . Colon cancer Neg Hx   . Colon polyps Neg Hx   . Rectal cancer Neg Hx   . Stomach cancer Neg Hx   :  Social History   Social History  . Marital status: Married    Spouse name: N/A  . Number of children: N/A  . Years of education: N/A   Occupational History  . Not on file.   Social History Main Topics  . Smoking status: Never Smoker  . Smokeless tobacco: Never Used  . Alcohol use 0.0 oz/week     Comment: social  . Drug use: No  . Sexual activity: Not on file   Other Topics Concern  . 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   :  Pertinent items are noted in HPI.  Exam: Blood pressure 123/85, pulse 91, temperature 98.4 F (36.9 C), temperature source Oral, resp. rate 16, height 6\' 2"  (1.88 m), weight 256 lb 4.8 oz (116.3 kg), SpO2 100 %. General appearance: alert and cooperative appeared without distress. Head: Normocephalic, without obvious abnormality Throat: lips, mucosa, and tongue normal; teeth and gums normal oral ulcers or lesions. Neck: no adenopathy Back: negative Resp: clear to auscultation bilaterally Cardio: regular rate and rhythm, S1, S2 normal, no murmur, click, rub or gallop GI: soft, non-tender; bowel sounds normal; no masses,  no organomegaly Extremities: extremities normal, atraumatic, no cyanosis or edema Pulses: 2+ and symmetric Lymph nodes: Cervical, supraclavicular, and axillary nodes normal.    Assessment and Plan:    56 year old with the following issues:  1. 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 He is status post radical nephrectomy done on 02/07/2016 at [facility]. 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 The measurements of the tumor is 9.9 x 9.0 x 9.0 cm. He did have small pulmonary nodules and a repeat scan on 03/25/2016 showed a 0.6 mm left lower lobe nodule previously measuring 3 mm. There is a 1 cm left lower lobe nodule previously was 0.5.  Due to his cardiomyopathy, it was recommended to proceed with Nivolumab and prefers to have this done locally. The natural course of advanced renal cell carcinoma was discussed with the patient today. Risks and benefits of immune therapy were reviewed as well. Complications associated with this medication include fatigue, tiredness, autoimmune thyroid disease, autoimmune pneumonitis, dermatitis among others.  After discussion today is agreeable to proceed with the standard dose of 240 mg every  2 weeks. He will have repeat imaging studies in 3 months at [facility] which is already scheduled.  2. Cardiomyopathy: He is following with Dr. Haroldine Laws. He had a cardiac catheterization and cardiac MRI that was done on 05/29/2016. The etiology his cardiomyopathy is unclear which could be transient in nature because of his malignancy in operation.  3. Thyroid function surveillance: He will have that TSH repeated after the start of immune therapy to screen for any potential hypothyroidism.  4. Follow-up: Will be next 2 weeks start treatment.

## 2016-05-29 NOTE — Progress Notes (Signed)
Faxed signed page #2 of [employer] access and reimbursement form re: opdivo.  attn: portia at [place] [phone]

## 2016-05-31 ENCOUNTER — Other Ambulatory Visit: Payer: BLUE CROSS/BLUE SHIELD

## 2016-05-31 ENCOUNTER — Encounter: Payer: Self-pay | Admitting: Oncology

## 2016-05-31 NOTE — Progress Notes (Signed)
Called to introduce myself as his FA and to discuss copay assistance.  Pt informed he has already been in touch w/ [employer] and completed an application with them for Opdivo.  His previous ins denied it because it was for off label use so he was being proactive in getting assistance just in case his current ins denied it as well.  I informed him our authorization team will try and obtain auth for his treatment, if approved we can proceed w/ copay assistance, if denied, we can continue to get assistance w/ [employer] for off label use.  He verbalized understanding and I will meet him on the 12th to give him my card.

## 2016-06-05 ENCOUNTER — Encounter: Payer: Self-pay | Admitting: *Deleted

## 2016-06-07 ENCOUNTER — Other Ambulatory Visit (HOSPITAL_BASED_OUTPATIENT_CLINIC_OR_DEPARTMENT_OTHER): Payer: BLUE CROSS/BLUE SHIELD

## 2016-06-07 ENCOUNTER — Ambulatory Visit (HOSPITAL_BASED_OUTPATIENT_CLINIC_OR_DEPARTMENT_OTHER): Payer: BLUE CROSS/BLUE SHIELD

## 2016-06-07 ENCOUNTER — Encounter (HOSPITAL_COMMUNITY): Payer: Self-pay | Admitting: Internal Medicine

## 2016-06-07 VITALS — BP 130/85 | HR 74 | Temp 98.5°F | Resp 18

## 2016-06-07 DIAGNOSIS — C649 Malignant neoplasm of unspecified kidney, except renal pelvis: Secondary | ICD-10-CM

## 2016-06-07 DIAGNOSIS — C651 Malignant neoplasm of right renal pelvis: Secondary | ICD-10-CM | POA: Diagnosis not present

## 2016-06-07 DIAGNOSIS — Z5112 Encounter for antineoplastic immunotherapy: Secondary | ICD-10-CM | POA: Diagnosis not present

## 2016-06-07 LAB — CBC WITH DIFFERENTIAL/PLATELET
BASO%: 0.8 % (ref 0.0–2.0)
BASOS ABS: 0 10*3/uL (ref 0.0–0.1)
EOS%: 2.9 % (ref 0.0–7.0)
Eosinophils Absolute: 0.2 10*3/uL (ref 0.0–0.5)
HEMATOCRIT: 45.4 % (ref 38.4–49.9)
HEMOGLOBIN: 15.2 g/dL (ref 13.0–17.1)
LYMPH#: 1.8 10*3/uL (ref 0.9–3.3)
LYMPH%: 29 % (ref 14.0–49.0)
MCH: 29.4 pg (ref 27.2–33.4)
MCHC: 33.5 g/dL (ref 32.0–36.0)
MCV: 87.7 fL (ref 79.3–98.0)
MONO#: 0.5 10*3/uL (ref 0.1–0.9)
MONO%: 7.9 % (ref 0.0–14.0)
NEUT%: 59.4 % (ref 39.0–75.0)
NEUTROS ABS: 3.7 10*3/uL (ref 1.5–6.5)
Platelets: 208 10*3/uL (ref 140–400)
RBC: 5.18 10*6/uL (ref 4.20–5.82)
RDW: 13.8 % (ref 11.0–14.6)
WBC: 6.3 10*3/uL (ref 4.0–10.3)

## 2016-06-07 LAB — COMPREHENSIVE METABOLIC PANEL
ALBUMIN: 4 g/dL (ref 3.5–5.0)
ALK PHOS: 58 U/L (ref 40–150)
ALT: 21 U/L (ref 0–55)
AST: 23 U/L (ref 5–34)
Anion Gap: 8 mEq/L (ref 3–11)
BILIRUBIN TOTAL: 0.8 mg/dL (ref 0.20–1.20)
BUN: 23.4 mg/dL (ref 7.0–26.0)
CALCIUM: 9.4 mg/dL (ref 8.4–10.4)
CO2: 24 mEq/L (ref 22–29)
CREATININE: 1.5 mg/dL — AB (ref 0.7–1.3)
Chloride: 107 mEq/L (ref 98–109)
EGFR: 52 mL/min/{1.73_m2} — ABNORMAL LOW (ref >90)
GLUCOSE: 94 mg/dL (ref 70–140)
POTASSIUM: 4.3 meq/L (ref 3.5–5.1)
Sodium: 140 mEq/L (ref 136–145)
TOTAL PROTEIN: 7.3 g/dL (ref 6.4–8.3)

## 2016-06-07 MED ORDER — NIVOLUMAB CHEMO INJECTION 100 MG/10ML
240.0000 mg | Freq: Once | INTRAVENOUS | Status: AC
  Administered 2016-06-07: 240 mg via INTRAVENOUS
  Filled 2016-06-07: qty 20

## 2016-06-07 NOTE — Patient Instructions (Signed)
[facility] Discharge Instructions for Patients Receiving Chemotherapy  Today you received the following chemotherapy agents Nivolumab (Opdivo)  To help prevent nausea and vomiting after your treatment, we encourage you to take your nausea medication as prescribed.  If you develop nausea and vomiting that is not controlled by your nausea medication, call the clinic.   BELOW ARE SYMPTOMS THAT SHOULD BE REPORTED IMMEDIATELY:  *FEVER GREATER THAN 100.5 F  *CHILLS WITH OR WITHOUT FEVER  NAUSEA AND VOMITING THAT IS NOT CONTROLLED WITH YOUR NAUSEA MEDICATION  *UNUSUAL SHORTNESS OF BREATH  *UNUSUAL BRUISING OR BLEEDING  TENDERNESS IN MOUTH AND THROAT WITH OR WITHOUT PRESENCE OF ULCERS  *URINARY PROBLEMS  *BOWEL PROBLEMS  UNUSUAL RASH Items with * indicate a potential emergency and should be followed up as soon as possible.  Feel free to call the clinic you have any questions or concerns. The clinic phone number is [phone].  Please show the [facility] at check-in to the Emergency Department and triage nurse.  Nivolumab injection What is this medicine? NIVOLUMAB (nye VOL ue mab) is a monoclonal antibody. It is used to treat melanoma, lung cancer, kidney cancer, head and neck cancer, Hodgkin lymphoma, and urothelial cancer. COMMON BRAND NAME(S): Opdivo What should I tell my health care provider before I take this medicine? They need to know if you have any of these conditions: -diabetes -immune system problems -kidney disease -liver disease -lung disease -organ transplant -stomach or intestine problems -thyroid disease -an unusual or allergic reaction to nivolumab, other medicines, foods, dyes, or preservatives -pregnant or trying to get pregnant -breast-feeding How should I use this medicine? This medicine is for infusion into a vein. It is given by a health care professional in a hospital or clinic setting. A special MedGuide will be given to you  before each treatment. Be sure to read this information carefully each time. Talk to your pediatrician regarding the use of this medicine in children. Special care may be needed. What if I miss a dose? It is important not to miss your dose. Call your doctor or health care professional if you are unable to keep an appointment. What may interact with this medicine? Interactions have not been studied. Give your health care provider a list of all the medicines, herbs, non-prescription drugs, or dietary supplements you use. Also tell them if you smoke, drink alcohol, or use illegal drugs. Some items may interact with your medicine. What should I watch for while using this medicine? This drug may make you feel generally unwell. Continue your course of treatment even though you feel ill unless your doctor tells you to stop. You may need blood work done while you are taking this medicine. Do not become pregnant while taking this medicine or for 5 months after stopping it. Women should inform their doctor if they wish to become pregnant or think they might be pregnant. There is a potential for serious side effects to an unborn child. Talk to your health care professional or pharmacist for more information. Do not breast-feed an infant while taking this medicine. What side effects may I notice from receiving this medicine? Side effects that you should report to your doctor or health care professional as soon as possible: -allergic reactions like skin rash, itching or hives, swelling of the face, lips, or tongue -black, tarry stools -blood in the urine -bloody or watery diarrhea -changes in vision -change in sex drive -changes in emotions or moods -chest pain -confusion -cough -decreased  appetite -diarrhea -facial flushing -feeling faint or lightheaded -fever, chills -hair loss -hallucination, loss of contact with reality -headache -irritable -joint pain -loss of memory -muscle pain -muscle  weakness -seizures -shortness of breath -signs and symptoms of high blood sugar such as dizziness; dry mouth; dry skin; fruity breath; nausea; stomach pain; increased hunger or thirst; increased urination -signs and symptoms of kidney injury like trouble passing urine or change in the amount of urine -signs and symptoms of liver injury like dark yellow or brown urine; general ill feeling or flu-like symptoms; light-colored stools; loss of appetite; nausea; right upper belly pain; unusually weak or tired; yellowing of the eyes or skin -stiff neck -swelling of the ankles, feet, hands -weight gain Side effects that usually do not require medical attention (report to your doctor or health care professional if they continue or are bothersome): -bone pain -constipation -tiredness -vomiting Where should I keep my medicine? This drug is given in a hospital or clinic and will not be stored at home.  2017 Elsevier/Gold Standard (2015-06-30 09:04:36)

## 2016-06-10 ENCOUNTER — Telehealth: Payer: Self-pay | Admitting: *Deleted

## 2016-06-10 ENCOUNTER — Encounter: Payer: Self-pay | Admitting: Oncology

## 2016-06-10 NOTE — Progress Notes (Signed)
Pt is approved w/ [employer] for copay assistance for Opdivo from 06/07/16 - 05/26/17 for $25,000.  Emailed copy of approval letter to Wilhemina Bonito, Belinda Fisher and Elmyra Ricks in billing and to HIM to be scanned in pt's chart.

## 2016-06-10 NOTE — Telephone Encounter (Signed)
Chemo follow up call. Patient doing great, eating and drinking. No c/o. Will call clinic with any issues

## 2016-06-10 NOTE — Telephone Encounter (Signed)
-----   Message from Sinda Du, RN sent at 06/07/2016  3:27 PM EST ----- Regarding: Dr. Alen Blew - 1st chemo f/u First chemo f/u

## 2016-06-12 LAB — HM DIABETES EYE EXAM

## 2016-06-13 ENCOUNTER — Encounter (HOSPITAL_COMMUNITY): Payer: Self-pay | Admitting: Internal Medicine

## 2016-06-13 ENCOUNTER — Telehealth: Payer: Self-pay | Admitting: *Deleted

## 2016-06-13 NOTE — Telephone Encounter (Signed)
This RN called and spoke to patient regarding diarrhea. Patient stated,"diarrhea started on Monday, 1/15. I told Dixie and she told me to get some Imodium. I am eating and drinking plenty. I think part of my problem is my diet. I eat a lot of yogurt and granola. I am going to switch back and try eggs and toast again." Patient denied anyone else in the house having diarrhea or being sick. I asked patient," how much Imodium are you taking daily?" Patient stated,"I have to go to the store and buy some. I haven't taken any yet." I asked," how many diarrhea episodes are you having daily?" He stated," three." Instructed him to get Imodium today and start taking it. Call me tomorrow at [phone] and let me know how you are doing before we go into the weekend. Patient verbalized understanding.

## 2016-06-19 ENCOUNTER — Telehealth: Payer: Self-pay | Admitting: *Deleted

## 2016-06-19 ENCOUNTER — Other Ambulatory Visit: Payer: Self-pay | Admitting: *Deleted

## 2016-06-19 DIAGNOSIS — Z85528 Personal history of other malignant neoplasm of kidney: Secondary | ICD-10-CM

## 2016-06-19 NOTE — Telephone Encounter (Signed)
Patient called and stated,"Gregory [name], I'm still having diarrhea. I am taking Imodium and it's not getting any better. I have increased gas and bloating. It's annoying, but not painful. It wakes me up 3-4 times a night. It stinks real bad, and it looks like brown water."  Per Dr. Alen Blew, have him see Selena Lesser, NP, on Friday 06/21/16. Patient verbalized understanding of conversation.

## 2016-06-21 ENCOUNTER — Ambulatory Visit (HOSPITAL_BASED_OUTPATIENT_CLINIC_OR_DEPARTMENT_OTHER): Payer: BLUE CROSS/BLUE SHIELD

## 2016-06-21 ENCOUNTER — Other Ambulatory Visit (HOSPITAL_BASED_OUTPATIENT_CLINIC_OR_DEPARTMENT_OTHER): Payer: BLUE CROSS/BLUE SHIELD

## 2016-06-21 ENCOUNTER — Other Ambulatory Visit (HOSPITAL_COMMUNITY)
Admission: RE | Admit: 2016-06-21 | Discharge: 2016-06-21 | Disposition: A | Discharge status: Home / Self Care | Payer: BLUE CROSS/BLUE SHIELD | Source: Ambulatory Visit | Attending: Nurse Practitioner | Admitting: Nurse Practitioner

## 2016-06-21 ENCOUNTER — Ambulatory Visit (HOSPITAL_BASED_OUTPATIENT_CLINIC_OR_DEPARTMENT_OTHER): Payer: BLUE CROSS/BLUE SHIELD | Admitting: Nurse Practitioner

## 2016-06-21 ENCOUNTER — Encounter: Payer: Self-pay | Admitting: Nurse Practitioner

## 2016-06-21 VITALS — BP 108/76 | HR 78 | Temp 98.3°F | Resp 18 | Ht 74.0 in | Wt 258.7 lb

## 2016-06-21 DIAGNOSIS — C651 Malignant neoplasm of right renal pelvis: Secondary | ICD-10-CM | POA: Diagnosis not present

## 2016-06-21 DIAGNOSIS — C641 Malignant neoplasm of right kidney, except renal pelvis: Secondary | ICD-10-CM

## 2016-06-21 DIAGNOSIS — Z85528 Personal history of other malignant neoplasm of kidney: Secondary | ICD-10-CM

## 2016-06-21 DIAGNOSIS — R197 Diarrhea, unspecified: Secondary | ICD-10-CM | POA: Diagnosis not present

## 2016-06-21 DIAGNOSIS — C649 Malignant neoplasm of unspecified kidney, except renal pelvis: Secondary | ICD-10-CM | POA: Diagnosis not present

## 2016-06-21 DIAGNOSIS — Z5112 Encounter for antineoplastic immunotherapy: Secondary | ICD-10-CM | POA: Diagnosis not present

## 2016-06-21 LAB — CBC WITH DIFFERENTIAL/PLATELET
BASO%: 0.8 % (ref 0.0–2.0)
Basophils Absolute: 0 10*3/uL (ref 0.0–0.1)
EOS ABS: 0.2 10*3/uL (ref 0.0–0.5)
EOS%: 2.7 % (ref 0.0–7.0)
HEMATOCRIT: 43.2 % (ref 38.4–49.9)
HGB: 14.9 g/dL (ref 13.0–17.1)
LYMPH#: 1.6 10*3/uL (ref 0.9–3.3)
LYMPH%: 27.7 % (ref 14.0–49.0)
MCH: 30.1 pg (ref 27.2–33.4)
MCHC: 34.5 g/dL (ref 32.0–36.0)
MCV: 87.1 fL (ref 79.3–98.0)
MONO#: 0.7 10*3/uL (ref 0.1–0.9)
MONO%: 11.5 % (ref 0.0–14.0)
NEUT%: 57.3 % (ref 39.0–75.0)
NEUTROS ABS: 3.3 10*3/uL (ref 1.5–6.5)
PLATELETS: 205 10*3/uL (ref 140–400)
RBC: 4.95 10*6/uL (ref 4.20–5.82)
RDW: 13 % (ref 11.0–14.6)
WBC: 5.8 10*3/uL (ref 4.0–10.3)

## 2016-06-21 LAB — COMPREHENSIVE METABOLIC PANEL
ALBUMIN: 3.9 g/dL (ref 3.5–5.0)
ALK PHOS: 61 U/L (ref 40–150)
ALT: 18 U/L (ref 0–55)
ANION GAP: 9 meq/L (ref 3–11)
AST: 20 U/L (ref 5–34)
BUN: 28.4 mg/dL — ABNORMAL HIGH (ref 7.0–26.0)
CALCIUM: 9.5 mg/dL (ref 8.4–10.4)
CO2: 21 mEq/L — ABNORMAL LOW (ref 22–29)
CREATININE: 1.4 mg/dL — AB (ref 0.7–1.3)
Chloride: 109 mEq/L (ref 98–109)
EGFR: 55 mL/min/{1.73_m2} — ABNORMAL LOW (ref >90)
Glucose: 90 mg/dl (ref 70–140)
Potassium: 4.5 mEq/L (ref 3.5–5.1)
Sodium: 138 mEq/L (ref 136–145)
TOTAL PROTEIN: 7.3 g/dL (ref 6.4–8.3)
Total Bilirubin: 0.58 mg/dL (ref 0.20–1.20)

## 2016-06-21 LAB — C DIFFICILE QUICK SCREEN W PCR REFLEX
C DIFFICLE (CDIFF) ANTIGEN: NEGATIVE
C Diff interpretation: NOT DETECTED
C Diff toxin: NEGATIVE

## 2016-06-21 MED ORDER — SODIUM CHLORIDE 0.9 % IV SOLN
240.0000 mg | Freq: Once | INTRAVENOUS | Status: AC
  Administered 2016-06-21: 240 mg via INTRAVENOUS
  Filled 2016-06-21: qty 20

## 2016-06-21 MED ORDER — DIPHENOXYLATE-ATROPINE 2.5-0.025 MG PO TABS: 2.0000 | ORAL_TABLET | Freq: Four times a day (QID) | ORAL | 0 refills | Status: DC | PRN

## 2016-06-21 MED ORDER — SODIUM CHLORIDE 0.9 % IV SOLN
Freq: Once | INTRAVENOUS | Status: AC
  Administered 2016-06-21: 13:00:00 via INTRAVENOUS

## 2016-06-21 NOTE — Assessment & Plan Note (Addendum)
Patient received his first cycle of his Nivolumab infusion on 06/07/2016.  Approximately  4-5 days after his first infusion-patient developed chronic diarrhea.  Patient states that his diarrhea is very watery and very foul-smelling.  He also states that he's had chronic abdominal cramping as well.  He has only tried Imodium occasionally for treatment of his diarrhea.  He denies any other new symptoms.  He denies any recent fevers or chills.  Patient states he's had no antibiotics within the last 1-2 months as well.  Patient has given a very small stool sample and will need to rule out C. difficile.  Also, could be colitis secondary to his Nivolumab  awaiting C. difficile results.  At this time.  Dr. Alen Blew in to examine patient as well; and he has advised the patient may proceed with his planned infusion today as scheduled. _______________________________________________________  Update: C. difficile stool was negative today.  Informed patient of the negative test results.  Patient was encouraged to alternate both the Imodium and Lomotil as directed.  He is also encouraged to stay as hydrated as possible.  He was advised to call/return or go directly to the emergency department for any worsening symptoms of the weekend whatsoever.

## 2016-06-21 NOTE — Patient Instructions (Signed)
[facility] Discharge Instructions for Patients Receiving Chemotherapy  Today you received the following chemotherapy agents nivolumab   To help prevent nausea and vomiting after your treatment, we encourage you to take your nausea medication as directed   If you develop nausea and vomiting that is not controlled by your nausea medication, call the clinic.   BELOW ARE SYMPTOMS THAT SHOULD BE REPORTED IMMEDIATELY:  *FEVER GREATER THAN 100.5 F  *CHILLS WITH OR WITHOUT FEVER  NAUSEA AND VOMITING THAT IS NOT CONTROLLED WITH YOUR NAUSEA MEDICATION  *UNUSUAL SHORTNESS OF BREATH  *UNUSUAL BRUISING OR BLEEDING  TENDERNESS IN MOUTH AND THROAT WITH OR WITHOUT PRESENCE OF ULCERS  *URINARY PROBLEMS  *BOWEL PROBLEMS  UNUSUAL RASH Items with * indicate a potential emergency and should be followed up as soon as possible.  Feel free to call the clinic you have any questions or concerns. The clinic phone number is [phone].  

## 2016-06-21 NOTE — Assessment & Plan Note (Signed)
Patient presents to the [facility] today to receive his second cycle of Nivolumab therapy.  He states that he has had diarrhea off and on for the past 7-10 days.  See further notes for details of today's visit.  Labs obtained today were all essentially normal; with the exception of a mild elevated creatinine, which is baseline for this patient since has only one kidney, status post nephrectomy.  Patient is scheduled to return on 09/02/2016 for labs, visit, and his next infusion.

## 2016-06-21 NOTE — Progress Notes (Signed)
SYMPTOM MANAGEMENT CLINIC    Chief Complaint: Diarrhea  HPI:  NHIA HEAPHY 56 y.o. male diagnosed with renal cell cancer.  Currently undergoing Nivolumab therapy.     No history exists.    Review of Systems  Gastrointestinal: Positive for abdominal pain and diarrhea.  All other systems reviewed and are negative.   Past Medical History:  Diagnosis Date  . Allergy   . Anxiety   . History of gastrointestinal hemorrhage   . Hypertension   . Hypertriglyceridemia   . Macular degeneration    lasar surgery- Dr Zigmund Daniel  . Plantar warts   . PONV (postoperative nausea and vomiting)     Past Surgical History:  Procedure Laterality Date  . CARDIAC CATHETERIZATION N/A 04/19/2016   Procedure: Left Heart Cath and Coronary Angiography;  Surgeon: Jolaine Artist, MD;  Location: [facility];  Service: Cardiovascular;  Laterality: N/A;  . COLONOSCOPY  03/22/2005  . COLOSTOMY    . EYE SURGERY Right    laser surgery, right eye macular degeneration  . orif fx fibula    . sebaceous cyst excision  1985  . TONSILLECTOMY    . 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    has No Known Allergies.  Allergies as of 06/21/2016   No Known Allergies     Medication List       Accurate as of 06/21/16  1:33 PM. Always use your most recent med list.          carvedilol 3.125 MG tablet Commonly known as:  COREG Take 1 tablet (3.125  mg total) by mouth 2 (two) times daily.   cetirizine 10 MG tablet Commonly known as:  ZYRTEC Take 10 mg by mouth daily.   cyanocobalamin 500 MCG tablet Take 500 mcg by mouth daily.   diphenoxylate-atropine 2.5-0.025 MG tablet Commonly known as:  LOMOTIL Take 2 tablets by mouth 4 (four) times daily as needed for diarrhea or loose stools.   loperamide 2 MG capsule Commonly known as:  IMODIUM Take 2 mg by mouth as needed for diarrhea or loose stools.   losartan 25 MG tablet Commonly known as:  COZAAR Take 1 tablet (25 mg total) by mouth daily.   PARoxetine 25 MG 24 hr tablet Commonly known as:  PAXIL-CR Take 25 mg by mouth daily.        PHYSICAL EXAMINATION  Oncology Vitals 06/21/2016 06/07/2016  Height 188 cm -  Weight 117.346 kg -  Weight (lbs) 258 lbs 11 oz -  BMI (kg/m2) 33.22 kg/m2 -  Temp 98.3 98.5  Pulse 78 74  Resp 18 18  SpO2 98 99  BSA (m2) 2.47 m2 -   BP Readings from Last 2 Encounters:  06/21/16 108/76  06/07/16 130/85    Physical Exam  Constitutional: He is oriented to person, place, and time and well-developed, well-nourished, and in no distress.  HENT:  Head: Normocephalic and atraumatic.  Eyes: Conjunctivae and EOM are normal. Pupils are equal, round, and reactive to light.  Neck: Normal  range of motion.  Pulmonary/Chest: Effort normal. No respiratory distress.  Musculoskeletal: Normal range of motion.  Neurological: He is alert and oriented to person, place, and time. Gait normal.  Skin: Skin is warm and dry.  Psychiatric: Affect normal.  Nursing note and vitals reviewed.   LABORATORY DATA:. Appointment on 06/21/2016  Component Date Value Ref Range Status  . WBC 06/21/2016 5.8  4.0 - 10.3 10e3/uL Final  . NEUT# 06/21/2016 3.3  1.5 - 6.5 10e3/uL Final  . HGB 06/21/2016 14.9  13.0 - 17.1 g/dL Final  . HCT 06/21/2016 43.2  38.4 - 49.9 % Final  . Platelets 06/21/2016 205  140 - 400 10e3/uL Final  . MCV 06/21/2016 87.1  79.3 - 98.0 fL Final    . MCH 06/21/2016 30.1  27.2 - 33.4 pg Final  . MCHC 06/21/2016 34.5  32.0 - 36.0 g/dL Final  . RBC 06/21/2016 4.95  4.20 - 5.82 10e6/uL Final  . RDW 06/21/2016 13.0  11.0 - 14.6 % Final  . lymph# 06/21/2016 1.6  0.9 - 3.3 10e3/uL Final  . MONO# 06/21/2016 0.7  0.1 - 0.9 10e3/uL Final  . Eosinophils Absolute 06/21/2016 0.2  0.0 - 0.5 10e3/uL Final  . Basophils Absolute 06/21/2016 0.0  0.0 - 0.1 10e3/uL Final  . NEUT% 06/21/2016 57.3  39.0 - 75.0 % Final  . LYMPH% 06/21/2016 27.7  14.0 - 49.0 % Final  . MONO% 06/21/2016 11.5  0.0 - 14.0 % Final  . EOS% 06/21/2016 2.7  0.0 - 7.0 % Final  . BASO% 06/21/2016 0.8  0.0 - 2.0 % Final  . Sodium 06/21/2016 138  136 - 145 mEq/L Final  . Potassium 06/21/2016 4.5  3.5 - 5.1 mEq/L Final  . Chloride 06/21/2016 109  98 - 109 mEq/L Final  . CO2 06/21/2016 21* 22 - 29 mEq/L Final  . Glucose 06/21/2016 90  70 - 140 mg/dl Final  . BUN 06/21/2016 28.4* 7.0 - 26.0 mg/dL Final  . Creatinine 06/21/2016 1.4* 0.7 - 1.3 mg/dL Final  . Total Bilirubin 06/21/2016 0.58  0.20 - 1.20 mg/dL Final  . Alkaline Phosphatase 06/21/2016 61  40 - 150 U/L Final  . AST 06/21/2016 20  5 - 34 U/L Final  . ALT 06/21/2016 18  0 - 55 U/L Final  . Total Protein 06/21/2016 7.3  6.4 - 8.3 g/dL Final  . Albumin 06/21/2016 3.9  3.5 - 5.0 g/dL Final  . Calcium 06/21/2016 9.5  8.4 - 10.4 mg/dL Final  . Anion Gap 06/21/2016 9  3 - 11 mEq/L Final  . EGFR 06/21/2016 55* >90 ml/min/1.73 m2 Final    RADIOGRAPHIC STUDIES: No results found.  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 today to receive his second cycle of Nivolumab therapy.  He states that he has had diarrhea off and on for the past 7-10 days.  See further notes for details of today's visit.  Labs obtained today were all essentially normal; with the exception of a mild elevated creatinine, which is baseline for this patient  since has only one kidney, status post nephrectomy.  Patient is scheduled to return on 09/02/2016 for labs, visit, and his next infusion.  Diarrhea Patient received his first cycle of his Nivolumab infusion on 06/07/2016.  Approximately  4-5 days after his first infusion-patient developed chronic diarrhea.  Patient states that his diarrhea is very watery and very foul-smelling.  He  also states that he's had chronic abdominal cramping as well.  He has only tried Imodium occasionally for treatment of his diarrhea.  He denies any other new symptoms.  He denies any recent fevers or chills.  Patient states he's had no antibiotics within the last 1-2 months as well.  Patient has given a very small stool sample and will need to rule out C. difficile.  Also, could be colitis secondary to his Nivolumab  awaiting C. difficile results.  At this time.  Dr. Alen Blew in to examine patient as well; and he has advised the patient may proceed with his planned infusion today as scheduled. _______________________________________________________  Update:    Patient stated understanding of all instructions; and was in agreement with this plan of care. The patient knows to call the clinic with any problems, questions or concerns.   Total time spent with patient was 25 minutes;  with greater than 75 percent of that time spent in face to face counseling regarding patient's symptoms,  and coordination of care and follow up.  Disclaimer:This dictation was prepared with Dragon/digital dictation along with [employer]. Any transcriptional errors that result from this process are unintentional.  Drue Second, NP 06/21/2016

## 2016-06-24 ENCOUNTER — Telehealth: Payer: Self-pay | Admitting: Nurse Practitioner

## 2016-06-24 NOTE — Telephone Encounter (Signed)
Callback patient to check in with him.  He states that he continues with some chronic, intermittent diarrhea.  He has been alternating both the Imodium and Lomotil.  He states he is taking plenty of fluids and does not feel dehydrated.  Advised patient once again that his stool for C. difficile was negative.  Patient was advised to call/return if he develops any worsening symptoms whatsoever.

## 2016-06-27 ENCOUNTER — Ambulatory Visit (HOSPITAL_COMMUNITY)
Admission: RE | Admit: 2016-06-27 | Discharge: 2016-06-27 | Disposition: A | Discharge status: Home / Self Care | Payer: BLUE CROSS/BLUE SHIELD | Source: Ambulatory Visit | Attending: Internal Medicine | Admitting: Internal Medicine

## 2016-06-27 ENCOUNTER — Encounter (HOSPITAL_COMMUNITY): Payer: Self-pay | Admitting: Internal Medicine

## 2016-06-27 VITALS — BP 122/86 | HR 60 | Resp 20 | Ht 74.0 in | Wt 265.0 lb

## 2016-06-27 DIAGNOSIS — I5022 Chronic systolic (congestive) heart failure: Secondary | ICD-10-CM

## 2016-06-27 DIAGNOSIS — R918 Other nonspecific abnormal finding of lung field: Secondary | ICD-10-CM | POA: Insufficient documentation

## 2016-06-27 DIAGNOSIS — Z808 Family history of malignant neoplasm of other organs or systems: Secondary | ICD-10-CM | POA: Insufficient documentation

## 2016-06-27 DIAGNOSIS — I429 Cardiomyopathy, unspecified: Secondary | ICD-10-CM | POA: Insufficient documentation

## 2016-06-27 DIAGNOSIS — Z818 Family history of other mental and behavioral disorders: Secondary | ICD-10-CM | POA: Insufficient documentation

## 2016-06-27 DIAGNOSIS — F419 Anxiety disorder, unspecified: Secondary | ICD-10-CM | POA: Insufficient documentation

## 2016-06-27 DIAGNOSIS — Z8249 Family history of ischemic heart disease and other diseases of the circulatory system: Secondary | ICD-10-CM | POA: Insufficient documentation

## 2016-06-27 DIAGNOSIS — Z79899 Other long term (current) drug therapy: Secondary | ICD-10-CM | POA: Diagnosis not present

## 2016-06-27 DIAGNOSIS — Z23 Encounter for immunization: Secondary | ICD-10-CM | POA: Diagnosis not present

## 2016-06-27 DIAGNOSIS — Z905 Acquired absence of kidney: Secondary | ICD-10-CM | POA: Diagnosis not present

## 2016-06-27 DIAGNOSIS — C641 Malignant neoplasm of right kidney, except renal pelvis: Secondary | ICD-10-CM | POA: Insufficient documentation

## 2016-06-27 DIAGNOSIS — I10 Essential (primary) hypertension: Secondary | ICD-10-CM | POA: Insufficient documentation

## 2016-06-27 MED ORDER — LOSARTAN POTASSIUM 50 MG PO TABS: 50.0000 mg | ORAL_TABLET | Freq: Every day | ORAL | 3 refills | Status: DC

## 2016-06-27 NOTE — Progress Notes (Signed)
ADVANCED HF CLINIC NOTE  Referring Physician:  Lennie Hummer MD  Primary Cardiologist: None  HPI:  Greg[place] [name] is a 56 y/o male with h/o morbid obesity, R renal cell CA s/p R nephrectomy in 9/17 referred for further evaluation of newly-discovered cardiomyopathy.  Developed microscopic hematuria and found to have large right renal mass c/w renal cell CA. Eventually underwent right radical nephrectomy with adrenal sparing on 02/07/16 at [facility]. Post-op developed bradycardia with HRs in 30s while sleeping. BP stable. When awakened had dizziness so given atropine. ECG revealed sinus brady in 40s with mild QT prolongation which resolved. Echo done and EF 45% with global HK. RV dilated  With normal function .  We saw him for the first time in October 2017. At that time we ordered cMRI and stress test. However in the interim he went back to [facility]. Found to have 2 lung nodules and wanted to start chemo. Echo showed EF at 46%. However MUGA with EF 28% so referred back to [place] for further evaluation prior to chemo. Underwent cath 04/09/16 as below.   1. 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 Otherwise normal coronaries 3. 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 Remains active. Denies dyspnea, orthopnea or PND. BP well controlled. Getting Opdevo.   FHx:   Father died at 71 from HF (unknown CAD status) Mother died 28 from brain cancer Sister fine   Review of Systems: [y] = yes, [ ]  = no   General: Weight gain Blue.Reese ]; Weight loss [ ] ; Anorexia [ ] ; Fatigue [ ] ; Fever [ ] ; Chills [ ] ; Weakness [ ]   Cardiac: Chest pain/pressure [ ] ; Resting SOB [ ] ; Exertional SOB [ ] ; Orthopnea [ ] ; Pedal Edema [ ] ; Palpitations [ ] ; Syncope [ ] ; Presyncope [ ] ; Paroxysmal nocturnal dyspnea[ ]   Pulmonary: Cough [ ] ;  Wheezing[ ] ; Hemoptysis[ ] ; Sputum [ ] ; Snoring [ ]   GI: Vomiting[ ] ; Dysphagia[ ] ; Melena[ ] ; Hematochezia [ ] ; Heartburn[ ] ; Abdominal pain [ ] ; Constipation [ ] ; Diarrhea [ ] ; BRBPR [ ]   GU: Hematuria[ ] ; Dysuria [ ] ; Nocturia[ ]   Vascular: Pain in legs with walking [ ] ; Pain in feet with lying flat [ ] ; Non-healing sores [ ] ; Stroke [ ] ; TIA [ ] ; Slurred speech [ ] ;  Neuro: Headaches[ ] ; Vertigo[ ] ; Seizures[ ] ; Paresthesias[ ] ;Blurred vision [ ] ; Diplopia [ ] ; Vision changes [ ]   Ortho/Skin: Arthritis [ ] ; Joint pain [ ] ; Muscle pain [ ] ; Joint swelling [ ] ; Back Pain [ ] ; Rash [ ]   Psych: Depression[ ] ; Anxiety[ ]   Heme: Bleeding problems [ ] ; Clotting disorders [ ] ; Anemia [ ]   Endocrine: Diabetes [ ] ; Thyroid dysfunction[ ]    Past Medical History:  Diagnosis Date  . Allergy   . Anxiety   . History of gastrointestinal hemorrhage   . Hypertension   . Hypertriglyceridemia   . Macular degeneration    lasar surgery- Dr Zigmund Aryannah Mohon  . Plantar warts   . PONV (postoperative nausea and vomiting)     Current Outpatient Prescriptions  Medication Sig Dispense Refill  . carvedilol (COREG) 3.125 MG tablet Take 1 tablet (3.125 mg total) by mouth 2 (two) times daily. 60 tablet 3  . cetirizine (ZYRTEC) 10 MG tablet Take 10 mg by mouth daily.    Marland Kitchen  cyanocobalamin 500 MCG tablet Take 500 mcg by mouth daily.    . diphenoxylate-atropine (LOMOTIL) 2.5-0.025 MG tablet Take 2 tablets by mouth 4 (four) times daily as needed for diarrhea or loose stools. 30 tablet 0  . loperamide (IMODIUM) 2 MG capsule Take 2 mg by mouth as needed for diarrhea or loose stools.    Marland Kitchen losartan (COZAAR) 25 MG tablet Take 1 tablet (25 mg total) by mouth daily. 30 tablet 3  . PARoxetine (PAXIL-CR) 25 MG 24 hr tablet Take 25 mg by mouth daily.     No current facility-administered medications for this encounter.     No Known Allergies    Social History   Social History  . Marital status: Married    Spouse name: N/A  .  Number of children: N/A  . Years of education: N/A   Occupational History  . Not on file.   Social History Main Topics  . Smoking status: Never Smoker  . Smokeless tobacco: Never Used  . Alcohol use 0.0 oz/week     Comment: social  . Drug use: No  . Sexual activity: Not on file   Other Topics Concern  . 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       Family History  Problem Relation Age of Onset  . Hypertension Mother   . Bipolar disorder Mother   . Other Father     brain tumor  . Colon cancer Neg Hx   . Colon polyps Neg Hx   . Rectal cancer Neg Hx   . Stomach cancer Neg Hx     Vitals:   06/27/16 1154  BP: 122/86  Pulse: 60  Resp: 20  SpO2: 100%  Weight: 265 lb (120.2 kg)  Height: 6\' 2"  (1.88 m)    PHYSICAL EXAM: General:  Well appearing. No respiratory difficulty HEENT: normal Neck: supple. no JVD. Carotids 2+ bilat; no bruits. No lymphadenopathy or thryomegaly appreciated. Cor: PMI nondisplaced. Regular rate & rhythm. No rubs, gallops or murmurs. Lungs: clear Abdomen: obese soft, nontender, nondistended. No hepatosplenomegaly. No bruits or masses. Good bowel sounds. Extremities: no cyanosis, clubbing, rash, edema Neuro: alert & oriented x 3, cranial nerves grossly intact. moves all 4 extremities w/o difficulty. Affect pleasant.  ECG: Sinus brady 42 QRS 96 QTC 445ms (02/07/16)              NSR 76 QRS 80 QTc 459 No ST-T wave abnormalities. (today)   ASSESSMENT & PLAN: 1. Abnormal echo/new-onset cardiomyopathy     -EF 30-35% by cath with NICM. MRI 1/18 confirms EF 28%. Etiology remains unclear. No evidence of scar of infiltrative process on MRI    -NYHA I. Volume status looks good.     -Continue carvedilol at 3.125 bid HR too low to titrate    -Increase losartan to 50 daily. Repeat echo  in 2 months.     -Agree with exercise and weight loss  2. Metastatic Renal Cell CA with lung nodules   --s/p nephrectomy. Currently on biological therapy.  3. Snoring   --sleep study negative  Coletta Lockner,MD 12:09 PM

## 2016-06-27 NOTE — Addendum Note (Signed)
Encounter addended by: Harvie Junior, CMA on: 06/27/2016 12:39 PM<BR>    Actions taken: Order list changed, Diagnosis association updated, Sign clinical note

## 2016-06-27 NOTE — Patient Instructions (Signed)
INCREASE Losartan to 50mg .  Follow up and Echo in 2 months with Dr.Bensimhon

## 2016-07-03 DIAGNOSIS — C641 Malignant neoplasm of right kidney, except renal pelvis: Secondary | ICD-10-CM | POA: Diagnosis not present

## 2016-07-03 DIAGNOSIS — Z79899 Other long term (current) drug therapy: Secondary | ICD-10-CM | POA: Diagnosis not present

## 2016-07-04 DIAGNOSIS — C649 Malignant neoplasm of unspecified kidney, except renal pelvis: Secondary | ICD-10-CM | POA: Diagnosis not present

## 2016-07-04 DIAGNOSIS — R918 Other nonspecific abnormal finding of lung field: Secondary | ICD-10-CM | POA: Diagnosis not present

## 2016-07-04 DIAGNOSIS — N400 Enlarged prostate without lower urinary tract symptoms: Secondary | ICD-10-CM | POA: Diagnosis not present

## 2016-07-04 DIAGNOSIS — C641 Malignant neoplasm of right kidney, except renal pelvis: Secondary | ICD-10-CM | POA: Diagnosis not present

## 2016-07-05 ENCOUNTER — Ambulatory Visit (HOSPITAL_BASED_OUTPATIENT_CLINIC_OR_DEPARTMENT_OTHER): Payer: BLUE CROSS/BLUE SHIELD | Admitting: Oncology

## 2016-07-05 ENCOUNTER — Ambulatory Visit (HOSPITAL_BASED_OUTPATIENT_CLINIC_OR_DEPARTMENT_OTHER): Payer: BLUE CROSS/BLUE SHIELD

## 2016-07-05 ENCOUNTER — Other Ambulatory Visit (HOSPITAL_BASED_OUTPATIENT_CLINIC_OR_DEPARTMENT_OTHER): Payer: BLUE CROSS/BLUE SHIELD

## 2016-07-05 ENCOUNTER — Telehealth: Payer: Self-pay | Admitting: Oncology

## 2016-07-05 ENCOUNTER — Telehealth: Payer: Self-pay | Admitting: *Deleted

## 2016-07-05 VITALS — BP 127/79 | HR 72 | Temp 98.2°F | Resp 20 | Ht 74.0 in | Wt 260.8 lb

## 2016-07-05 DIAGNOSIS — C649 Malignant neoplasm of unspecified kidney, except renal pelvis: Secondary | ICD-10-CM | POA: Diagnosis not present

## 2016-07-05 DIAGNOSIS — C799 Secondary malignant neoplasm of unspecified site: Secondary | ICD-10-CM | POA: Diagnosis not present

## 2016-07-05 DIAGNOSIS — C641 Malignant neoplasm of right kidney, except renal pelvis: Secondary | ICD-10-CM

## 2016-07-05 DIAGNOSIS — I429 Cardiomyopathy, unspecified: Secondary | ICD-10-CM | POA: Diagnosis not present

## 2016-07-05 DIAGNOSIS — R197 Diarrhea, unspecified: Secondary | ICD-10-CM | POA: Diagnosis not present

## 2016-07-05 DIAGNOSIS — Z5112 Encounter for antineoplastic immunotherapy: Secondary | ICD-10-CM | POA: Diagnosis not present

## 2016-07-05 LAB — COMPREHENSIVE METABOLIC PANEL
ALBUMIN: 4.1 g/dL (ref 3.5–5.0)
ALK PHOS: 60 U/L (ref 40–150)
ALT: 19 U/L (ref 0–55)
AST: 18 U/L (ref 5–34)
Anion Gap: 9 mEq/L (ref 3–11)
BUN: 26.1 mg/dL — ABNORMAL HIGH (ref 7.0–26.0)
CHLORIDE: 108 meq/L (ref 98–109)
CO2: 23 meq/L (ref 22–29)
Calcium: 9.7 mg/dL (ref 8.4–10.4)
Creatinine: 1.5 mg/dL — ABNORMAL HIGH (ref 0.7–1.3)
EGFR: 53 mL/min/{1.73_m2} — AB (ref >90)
Glucose: 94 mg/dl (ref 70–140)
POTASSIUM: 4.5 meq/L (ref 3.5–5.1)
Sodium: 141 mEq/L (ref 136–145)
Total Bilirubin: 0.66 mg/dL (ref 0.20–1.20)
Total Protein: 7.4 g/dL (ref 6.4–8.3)

## 2016-07-05 LAB — CBC WITH DIFFERENTIAL/PLATELET
BASO%: 0.5 % (ref 0.0–2.0)
BASOS ABS: 0 10*3/uL (ref 0.0–0.1)
EOS ABS: 0.2 10*3/uL (ref 0.0–0.5)
EOS%: 3.4 % (ref 0.0–7.0)
HCT: 40.9 % (ref 38.4–49.9)
HGB: 14.3 g/dL (ref 13.0–17.1)
LYMPH%: 25.5 % (ref 14.0–49.0)
MCH: 29.9 pg (ref 27.2–33.4)
MCHC: 35 g/dL (ref 32.0–36.0)
MCV: 85.6 fL (ref 79.3–98.0)
MONO#: 0.6 10*3/uL (ref 0.1–0.9)
MONO%: 10.5 % (ref 0.0–14.0)
NEUT#: 3.7 10*3/uL (ref 1.5–6.5)
NEUT%: 60.1 % (ref 39.0–75.0)
Platelets: 181 10*3/uL (ref 140–400)
RBC: 4.78 10*6/uL (ref 4.20–5.82)
RDW: 12.6 % (ref 11.0–14.6)
WBC: 6.1 10*3/uL (ref 4.0–10.3)
lymph#: 1.6 10*3/uL (ref 0.9–3.3)

## 2016-07-05 MED ORDER — NIVOLUMAB CHEMO INJECTION 100 MG/10ML
240.0000 mg | Freq: Once | INTRAVENOUS | Status: AC
  Administered 2016-07-05: 240 mg via INTRAVENOUS
  Filled 2016-07-05: qty 20

## 2016-07-05 MED ORDER — SODIUM CHLORIDE 0.9 % IV SOLN
Freq: Once | INTRAVENOUS | Status: AC
  Administered 2016-07-05: 13:00:00 via INTRAVENOUS

## 2016-07-05 NOTE — Patient Instructions (Signed)
[facility] Discharge Instructions for Patients Receiving Chemotherapy  Today you received the following chemotherapy agents:  Nivolumab.  To help prevent nausea and vomiting after your treatment, we encourage you to take your nausea medication as directed.   If you develop nausea and vomiting that is not controlled by your nausea medication, call the clinic.   BELOW ARE SYMPTOMS THAT SHOULD BE REPORTED IMMEDIATELY:  *FEVER GREATER THAN 100.5 F  *CHILLS WITH OR WITHOUT FEVER  NAUSEA AND VOMITING THAT IS NOT CONTROLLED WITH YOUR NAUSEA MEDICATION  *UNUSUAL SHORTNESS OF BREATH  *UNUSUAL BRUISING OR BLEEDING  TENDERNESS IN MOUTH AND THROAT WITH OR WITHOUT PRESENCE OF ULCERS  *URINARY PROBLEMS  *BOWEL PROBLEMS  UNUSUAL RASH Items with * indicate a potential emergency and should be followed up as soon as possible.  Feel free to call the clinic you have any questions or concerns. The clinic phone number is [phone].  Please show the CHEMO ALERT CARD at check-in to the Emergency Department and triage nurse.   

## 2016-07-05 NOTE — Progress Notes (Signed)
Hematology and Oncology Follow Up Visit  Gregory [name] [id number] 1960-07-05 56 y.o. 07/05/2016 12:21 PM [name], [name], [place], MD   Principle Diagnosis: 56 year old gentleman with renal cell cancer diagnosed in August 2017. He was found to have pulmonary nodules and currently has metastatic disease.   Prior Therapy: He is status post radical nephrectomy done on 02/07/2016 at [name]. 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 The measurements of the tumor is 9.9 x 9.0 x 9.0 cm. He did have small pulmonary nodules and a repeat scan on 03/25/2016 showed a 0.6 mm left lower lobe nodule previously measuring 3 mm. There is a 1 cm left lower lobe nodule previously was 0.5.  Current therapy:  Nivolumab 240 mg every 2 weeks started on 06/07/2016. This is his first-line therapy chosen because of cardiomyopathy.  He is here for cycle 3 of therapy.  Interim History: [place] [name] presents today for a follow-up visit. Since the last visit, he started therapy and have tolerated it reasonably well. He did report very little skin irritation and itching and mild diarrhea. His diarrhea has been very sporadic and unpredictable. He was checked for C. difficile and 06/21/2016 and was negative. His diarrhea sometimes subsides and exacerbated at times by food. There is no fatigue, tiredness or inability to perform activities of daily living. He still continues to work full time and travel to [name] this week.  He does not report any headaches or blurry vision, syncope or seizures. He does not report any fevers, chills, sweats or weight loss. His appetite is excellent and have gained weight. He does not report any chest pain, palpitation, orthopnea or leg edema. He does not report any cough, wheezing or hemoptysis. He does not report any nausea, vomiting or abdominal pain. He does not report any frequency urgency or hesitancy. Remaining review of  systems unremarkable.   Medications: I have reviewed the patient's current medications.  Current Outpatient Prescriptions  Medication Sig Dispense Refill  . carvedilol (COREG) 3.125 MG tablet Take 1 tablet (3.125 mg total) by mouth 2 (two) times daily. 60 tablet 3  . cetirizine (ZYRTEC) 10 MG tablet Take 10 mg by mouth daily.    . cyanocobalamin 500 MCG tablet Take 500 mcg by mouth daily.    . diphenoxylate-atropine (LOMOTIL) 2.5-0.025 MG tablet Take 2 tablets by mouth 4 (four) times daily as needed for diarrhea or loose stools. 30 tablet 0  . loperamide (IMODIUM) 2 MG capsule Take 2 mg by mouth as needed for diarrhea or loose stools.    Marland Kitchen losartan (COZAAR) 50 MG tablet Take 1 tablet (50 mg total) by mouth daily. 30 tablet 3  . PARoxetine (PAXIL-CR) 25 MG 24 hr tablet Take 25 mg by mouth daily.     No current facility-administered medications for this visit.      Allergies: No Known Allergies  Past Medical History, Surgical history, Social history, and Family History were reviewed and updated.   Physical Exam: Blood pressure 127/79, pulse 72, temperature 98.2 F (36.8 C), temperature source Oral, resp. rate 20, height 6\' 2"  (1.88 m), weight 260 lb 12.8 oz (118.3 kg), SpO2 100 %. ECOG: 0 General appearance: alert and cooperative Head: Normocephalic, without obvious abnormality Neck: no adenopathy Lymph nodes: Cervical, supraclavicular, and axillary nodes normal. Heart:regular rate and rhythm, S1, S2 normal, no murmur, click, rub or gallop Lung:chest clear, no wheezing, rales, normal symmetric air entry Abdomin: soft, non-tender, without masses  or organomegaly EXT:no erythema, induration, or nodules   Lab Results: Lab Results  Component Value Date   WBC 6.1 07/05/2016   HGB 14.3 07/05/2016   HCT 40.9 07/05/2016   MCV 85.6 07/05/2016   PLT 181 07/05/2016     Chemistry      Component Value Date/Time   NA 138 06/21/2016 1035   K 4.5 06/21/2016 1035   CL 107 04/17/2016  1400   CO2 21 (L) 06/21/2016 1035   BUN 28.4 (H) 06/21/2016 1035   CREATININE 1.4 (H) 06/21/2016 1035      Component Value Date/Time   CALCIUM 9.5 06/21/2016 1035   ALKPHOS 61 06/21/2016 1035   AST 20 06/21/2016 1035   ALT 18 06/21/2016 1035   BILITOT 0.58 06/21/2016 1035     CT scan of the chest abdomen and pelvis obtained on 07/03/2016 and [name] was personally reviewed today. The scan did show left lower lobe pulmonary nodule that have slightly increased in size. He has a nodule measuring 15 mm and another one measuring 9.5 mm. There are additional pulmonary nodules on the long not visible on 03/25/2012. There is a right middle lobe nodule measuring 5 mm. There is no other areas of metastasis noted.   Impression and Plan: 56 year old with the following issues:  1. 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 He is status post radical nephrectomy done on 02/07/2016 at [name]. 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 The measurements of the tumor is 9.9 x 9.0 x 9.0 cm. He did have small pulmonary nodules   He is currently receiving Nivolumab 240 mg every 2 weeks started on 06/07/2016. He tolerated the therapy well with grade 1 diarrhea that is manageable at this time. The plan is to continue the same dose and schedule and he will have repeat imaging studies in 2 months.Greg[facility] [name].  2. Diarrhea: It is unclear whether that's immune mediated at this time. Appears to be sporadic in nature and depending on his diet. His amylase is increase which will be repeated in the future for monitoring purposes.  3. Cardiomyopathy: Continues to follow with cardiology and his cardiac medications are optimized. His EF continues to be close to 20%.  4. Thyroid function surveillance: His TSH will be repeated periodically.  5. Follow-up: Will be in 2 weeks for the next cycle of  therapy.   [facility], MD 2/9/201812:21 PM

## 2016-07-05 NOTE — Telephone Encounter (Signed)
Appointments scheduled per 2/9 LOS. Patient given AVS report and calendars with future scheduled appointments.  °

## 2016-07-05 NOTE — Telephone Encounter (Signed)
Per 2/9 LOS ans staff message I have scheduled appts. I have given calendar to patient. Notified the scheduler

## 2016-07-06 LAB — AMYLASE: AMYLASE: 129 U/L — AB (ref 31–124)

## 2016-07-18 ENCOUNTER — Other Ambulatory Visit: Payer: Self-pay

## 2016-07-18 DIAGNOSIS — C649 Malignant neoplasm of unspecified kidney, except renal pelvis: Secondary | ICD-10-CM

## 2016-07-19 ENCOUNTER — Ambulatory Visit (HOSPITAL_BASED_OUTPATIENT_CLINIC_OR_DEPARTMENT_OTHER): Payer: BLUE CROSS/BLUE SHIELD

## 2016-07-19 ENCOUNTER — Other Ambulatory Visit (HOSPITAL_BASED_OUTPATIENT_CLINIC_OR_DEPARTMENT_OTHER): Payer: BLUE CROSS/BLUE SHIELD

## 2016-07-19 VITALS — BP 132/69 | HR 75 | Temp 98.7°F | Resp 18

## 2016-07-19 DIAGNOSIS — Z5112 Encounter for antineoplastic immunotherapy: Secondary | ICD-10-CM | POA: Diagnosis not present

## 2016-07-19 DIAGNOSIS — C649 Malignant neoplasm of unspecified kidney, except renal pelvis: Secondary | ICD-10-CM

## 2016-07-19 DIAGNOSIS — C641 Malignant neoplasm of right kidney, except renal pelvis: Secondary | ICD-10-CM

## 2016-07-19 LAB — COMPREHENSIVE METABOLIC PANEL
ALT: 21 U/L (ref 0–55)
AST: 18 U/L (ref 5–34)
Albumin: 4.2 g/dL (ref 3.5–5.0)
Alkaline Phosphatase: 64 U/L (ref 40–150)
Anion Gap: 8 mEq/L (ref 3–11)
BILIRUBIN TOTAL: 0.55 mg/dL (ref 0.20–1.20)
BUN: 23.7 mg/dL (ref 7.0–26.0)
CO2: 27 meq/L (ref 22–29)
CREATININE: 1.5 mg/dL — AB (ref 0.7–1.3)
Calcium: 9.8 mg/dL (ref 8.4–10.4)
Chloride: 105 mEq/L (ref 98–109)
EGFR: 53 mL/min/{1.73_m2} — ABNORMAL LOW (ref >90)
GLUCOSE: 105 mg/dL (ref 70–140)
Potassium: 4.4 mEq/L (ref 3.5–5.1)
SODIUM: 141 meq/L (ref 136–145)
TOTAL PROTEIN: 7.7 g/dL (ref 6.4–8.3)

## 2016-07-19 LAB — CBC WITH DIFFERENTIAL/PLATELET
BASO%: 0.4 % (ref 0.0–2.0)
Basophils Absolute: 0 10*3/uL (ref 0.0–0.1)
EOS%: 3.9 % (ref 0.0–7.0)
Eosinophils Absolute: 0.2 10*3/uL (ref 0.0–0.5)
HCT: 41.2 % (ref 38.4–49.9)
HGB: 14.4 g/dL (ref 13.0–17.1)
LYMPH%: 25.7 % (ref 14.0–49.0)
MCH: 30.4 pg (ref 27.2–33.4)
MCHC: 35 g/dL (ref 32.0–36.0)
MCV: 86.9 fL (ref 79.3–98.0)
MONO#: 0.4 10*3/uL (ref 0.1–0.9)
MONO%: 7.4 % (ref 0.0–14.0)
NEUT%: 62.6 % (ref 39.0–75.0)
NEUTROS ABS: 3.6 10*3/uL (ref 1.5–6.5)
PLATELETS: 177 10*3/uL (ref 140–400)
RBC: 4.74 10*6/uL (ref 4.20–5.82)
RDW: 12.5 % (ref 11.0–14.6)
WBC: 5.7 10*3/uL (ref 4.0–10.3)
lymph#: 1.5 10*3/uL (ref 0.9–3.3)

## 2016-07-19 LAB — TSH: TSH: <0.08 m(IU)/L — ABNORMAL LOW (ref 0.320–4.118)

## 2016-07-19 MED ORDER — SODIUM CHLORIDE 0.9 % IV SOLN
240.0000 mg | Freq: Once | INTRAVENOUS | Status: AC
  Administered 2016-07-19: 240 mg via INTRAVENOUS
  Filled 2016-07-19: qty 20

## 2016-07-19 MED ORDER — SODIUM CHLORIDE 0.9 % IV SOLN
Freq: Once | INTRAVENOUS | Status: AC
  Administered 2016-07-19: 13:00:00 via INTRAVENOUS

## 2016-07-19 NOTE — Patient Instructions (Signed)
[facility] Discharge Instructions for Patients Receiving Chemotherapy  Today you received the following chemotherapy agents nivolumab   To help prevent nausea and vomiting after your treatment, we encourage you to take your nausea medication as directed   If you develop nausea and vomiting that is not controlled by your nausea medication, call the clinic.   BELOW ARE SYMPTOMS THAT SHOULD BE REPORTED IMMEDIATELY:  *FEVER GREATER THAN 100.5 F  *CHILLS WITH OR WITHOUT FEVER  NAUSEA AND VOMITING THAT IS NOT CONTROLLED WITH YOUR NAUSEA MEDICATION  *UNUSUAL SHORTNESS OF BREATH  *UNUSUAL BRUISING OR BLEEDING  TENDERNESS IN MOUTH AND THROAT WITH OR WITHOUT PRESENCE OF ULCERS  *URINARY PROBLEMS  *BOWEL PROBLEMS  UNUSUAL RASH Items with * indicate a potential emergency and should be followed up as soon as possible.  Feel free to call the clinic you have any questions or concerns. The clinic phone number is [phone].  

## 2016-07-25 ENCOUNTER — Telehealth: Payer: Self-pay | Admitting: Oncology

## 2016-07-25 NOTE — Telephone Encounter (Signed)
Spoke with patient about the appointment changes and confirmed

## 2016-07-31 ENCOUNTER — Ambulatory Visit (INDEPENDENT_AMBULATORY_CARE_PROVIDER_SITE_OTHER): Payer: BLUE CROSS/BLUE SHIELD | Admitting: Ophthalmology

## 2016-07-31 DIAGNOSIS — H43813 Vitreous degeneration, bilateral: Secondary | ICD-10-CM | POA: Diagnosis not present

## 2016-07-31 DIAGNOSIS — I10 Essential (primary) hypertension: Secondary | ICD-10-CM

## 2016-07-31 DIAGNOSIS — H2513 Age-related nuclear cataract, bilateral: Secondary | ICD-10-CM | POA: Diagnosis not present

## 2016-07-31 DIAGNOSIS — H35033 Hypertensive retinopathy, bilateral: Secondary | ICD-10-CM

## 2016-07-31 DIAGNOSIS — H353211 Exudative age-related macular degeneration, right eye, with active choroidal neovascularization: Secondary | ICD-10-CM

## 2016-07-31 DIAGNOSIS — H353121 Nonexudative age-related macular degeneration, left eye, early dry stage: Secondary | ICD-10-CM | POA: Diagnosis not present

## 2016-08-02 ENCOUNTER — Ambulatory Visit: Payer: BLUE CROSS/BLUE SHIELD

## 2016-08-02 ENCOUNTER — Ambulatory Visit (HOSPITAL_BASED_OUTPATIENT_CLINIC_OR_DEPARTMENT_OTHER): Payer: BLUE CROSS/BLUE SHIELD | Admitting: Oncology

## 2016-08-02 ENCOUNTER — Telehealth: Payer: Self-pay | Admitting: Oncology

## 2016-08-02 ENCOUNTER — Other Ambulatory Visit (HOSPITAL_BASED_OUTPATIENT_CLINIC_OR_DEPARTMENT_OTHER): Payer: BLUE CROSS/BLUE SHIELD

## 2016-08-02 ENCOUNTER — Ambulatory Visit (HOSPITAL_BASED_OUTPATIENT_CLINIC_OR_DEPARTMENT_OTHER): Payer: BLUE CROSS/BLUE SHIELD

## 2016-08-02 VITALS — BP 104/50 | HR 79 | Temp 98.1°F | Resp 18 | Ht 74.0 in | Wt 257.9 lb

## 2016-08-02 DIAGNOSIS — C641 Malignant neoplasm of right kidney, except renal pelvis: Secondary | ICD-10-CM

## 2016-08-02 DIAGNOSIS — I429 Cardiomyopathy, unspecified: Secondary | ICD-10-CM | POA: Diagnosis not present

## 2016-08-02 DIAGNOSIS — C7800 Secondary malignant neoplasm of unspecified lung: Secondary | ICD-10-CM

## 2016-08-02 DIAGNOSIS — Z5112 Encounter for antineoplastic immunotherapy: Secondary | ICD-10-CM

## 2016-08-02 DIAGNOSIS — R197 Diarrhea, unspecified: Secondary | ICD-10-CM | POA: Diagnosis not present

## 2016-08-02 DIAGNOSIS — C649 Malignant neoplasm of unspecified kidney, except renal pelvis: Secondary | ICD-10-CM

## 2016-08-02 LAB — COMPREHENSIVE METABOLIC PANEL
ALK PHOS: 57 U/L (ref 40–150)
ALT: 17 U/L (ref 0–55)
AST: 19 U/L (ref 5–34)
Albumin: 4 g/dL (ref 3.5–5.0)
Anion Gap: 8 mEq/L (ref 3–11)
BUN: 23.8 mg/dL (ref 7.0–26.0)
CALCIUM: 9.6 mg/dL (ref 8.4–10.4)
CHLORIDE: 106 meq/L (ref 98–109)
CO2: 26 mEq/L (ref 22–29)
Creatinine: 1.6 mg/dL — ABNORMAL HIGH (ref 0.7–1.3)
EGFR: 47 mL/min/{1.73_m2} — AB (ref >90)
Glucose: 102 mg/dl (ref 70–140)
POTASSIUM: 4.3 meq/L (ref 3.5–5.1)
SODIUM: 140 meq/L (ref 136–145)
Total Bilirubin: 0.82 mg/dL (ref 0.20–1.20)
Total Protein: 7.2 g/dL (ref 6.4–8.3)

## 2016-08-02 LAB — CBC WITH DIFFERENTIAL/PLATELET
BASO%: 0.5 % (ref 0.0–2.0)
Basophils Absolute: 0 10*3/uL (ref 0.0–0.1)
EOS ABS: 0.2 10*3/uL (ref 0.0–0.5)
EOS%: 3.5 % (ref 0.0–7.0)
HEMATOCRIT: 41.9 % (ref 38.4–49.9)
HGB: 14.5 g/dL (ref 13.0–17.1)
LYMPH%: 20.9 % (ref 14.0–49.0)
MCH: 29.9 pg (ref 27.2–33.4)
MCHC: 34.5 g/dL (ref 32.0–36.0)
MCV: 86.7 fL (ref 79.3–98.0)
MONO#: 0.7 10*3/uL (ref 0.1–0.9)
MONO%: 10.9 % (ref 0.0–14.0)
NEUT#: 4.1 10*3/uL (ref 1.5–6.5)
NEUT%: 64.2 % (ref 39.0–75.0)
Platelets: 181 10*3/uL (ref 140–400)
RBC: 4.83 10*6/uL (ref 4.20–5.82)
RDW: 12.5 % (ref 11.0–14.6)
WBC: 6.3 10*3/uL (ref 4.0–10.3)
lymph#: 1.3 10*3/uL (ref 0.9–3.3)

## 2016-08-02 MED ORDER — HEPARIN SOD (PORK) LOCK FLUSH 100 UNIT/ML IV SOLN
500.0000 [IU] | Freq: Once | INTRAVENOUS | Status: DC | PRN
  Filled 2016-08-02: qty 5

## 2016-08-02 MED ORDER — SODIUM CHLORIDE 0.9 % IV SOLN
240.0000 mg | Freq: Once | INTRAVENOUS | Status: AC
  Administered 2016-08-02: 240 mg via INTRAVENOUS
  Filled 2016-08-02: qty 20

## 2016-08-02 MED ORDER — SODIUM CHLORIDE 0.9% FLUSH
10.0000 mL | INTRAVENOUS | Status: DC | PRN
  Filled 2016-08-02: qty 10

## 2016-08-02 MED ORDER — SODIUM CHLORIDE 0.9 % IV SOLN
Freq: Once | INTRAVENOUS | Status: AC
  Administered 2016-08-02: 15:00:00 via INTRAVENOUS

## 2016-08-02 NOTE — Progress Notes (Signed)
Per Erline Levine RN ok to [facility] with creatinine 1.6.

## 2016-08-02 NOTE — Progress Notes (Signed)
Hematology and Oncology Follow Up Visit  Gregory [name] [id number] Aug 05, 1960 56 y.o. 08/02/2016 1:45 PM Gregory [name], [name], Fransico Him, MD   Principle Diagnosis: 56 year old gentleman with renal cell cancer diagnosed in August 2017. He was found to have pulmonary nodules and currently has metastatic disease.   Prior Therapy: He is status post radical nephrectomy done on 02/07/2016 at [facility]. 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 The measurements of the tumor is 9.9 x 9.0 x 9.0 cm. He did have small pulmonary nodules and a repeat scan on 03/25/2016 showed a 0.6 mm left lower lobe nodule previously measuring 3 mm. There is a 1 cm left lower lobe nodule previously was 0.5.  Current therapy:  Nivolumab 240 mg every 2 weeks started on 06/07/2016. This is his first-line therapy chosen because of cardiomyopathy.  He is here for cycle 4 of therapy.  Interim History: [name] presents today for a follow-up visit. Since the last visit, he reports no complications or changes. He continues to tolerate treatment without any major complications. He denied any nausea, abdominal pain or dyspepsia. He denied any pulmonary symptoms or skin changes. He does have regular bowel movements at times but have been manageable. He denied any infusion-related complications.  He does not report any headaches or blurry vision, syncope or seizures. He does not report any fevers, chills, sweats or weight loss. His appetite is excellent and have gained weight. He does not report any chest pain, palpitation, orthopnea or leg edema. He does not report any cough, wheezing or hemoptysis. He does not report any nausea, vomiting or abdominal pain. He does not report any frequency urgency or hesitancy. Remaining review of systems unremarkable.   Medications: I have reviewed the patient's current medications.  Current Outpatient Prescriptions  Medication Sig Dispense  Refill  . carvedilol (COREG) 3.125 MG tablet Take 1 tablet (3.125 mg total) by mouth 2 (two) times daily. 60 tablet 3  . cetirizine (ZYRTEC) 10 MG tablet Take 10 mg by mouth daily.    . cyanocobalamin 500 MCG tablet Take 500 mcg by mouth daily.    . diphenoxylate-atropine (LOMOTIL) 2.5-0.025 MG tablet Take 2 tablets by mouth 4 (four) times daily as needed for diarrhea or loose stools. 30 tablet 0  . loperamide (IMODIUM) 2 MG capsule Take 2 mg by mouth as needed for diarrhea or loose stools.    Marland Kitchen losartan (COZAAR) 50 MG tablet Take 1 tablet (50 mg total) by mouth daily. 30 tablet 3  . PARoxetine (PAXIL-CR) 25 MG 24 hr tablet Take 25 mg by mouth daily.     No current facility-administered medications for this visit.      Allergies: No Known Allergies  Past Medical History, Surgical history, Social history, and Family History were reviewed and updated.   Physical Exam: Blood pressure (!) 104/50, pulse 79, temperature 98.1 F (36.7 C), temperature source Oral, resp. rate 18, height 6\' 2"  (1.88 m), weight 257 lb 14.4 oz (117 kg), SpO2 98 %. ECOG: 0 General appearance: alert and cooperative appeared without distress. Head: Normocephalic, without obvious abnormality without oral ulcers or lesions. Neck: no adenopathy Lymph nodes: Cervical, supraclavicular, and axillary nodes normal. Heart:regular rate and rhythm, S1, S2 normal, no murmur, click, rub or gallop Lung:chest clear, no wheezing, rales, normal symmetric air entry Abdomin: soft, non-tender, without masses or organomegaly EXT:no erythema, induration, or nodules   Lab Results: Lab Results  Component Value Date   WBC  6.3 08/02/2016   HGB 14.5 08/02/2016   HCT 41.9 08/02/2016   MCV 86.7 08/02/2016   PLT 181 08/02/2016     Chemistry      Component Value Date/Time   NA 141 07/19/2016 1202   K 4.4 07/19/2016 1202   CL 107 04/17/2016 1400   CO2 27 07/19/2016 1202   BUN 23.7 07/19/2016 1202   CREATININE 1.5 (H) 07/19/2016  1202      Component Value Date/Time   CALCIUM 9.8 07/19/2016 1202   ALKPHOS 64 07/19/2016 1202   AST 18 07/19/2016 1202   ALT 21 07/19/2016 1202   BILITOT 0.55 07/19/2016 1202     CT scan of the chest abdomen and pelvis obtained on 07/03/2016 and [facility] was personally reviewed today. The scan did show left lower lobe pulmonary nodule that have slightly increased in size. He has a nodule measuring 15 mm and another one measuring 9.5 mm. There are additional pulmonary nodules on the long not visible on 03/25/2012. There is a right middle lobe nodule measuring 5 mm. There is no other areas of metastasis noted.   Impression and Plan: 56 year old with the following issues:  1. 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 He is status post radical nephrectomy done on 02/07/2016 at [facility]. 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 The measurements of the tumor is 9.9 x 9.0 x 9.0 cm. He did have small pulmonary nodules   He is currently receiving Nivolumab 240 mg every 2 weeks started on 06/07/2016.   The plan is to continue with the same dose and schedule every 2 weeks and he will have repeat imaging studies in April 2018 at [facility].  2. Diarrhea: It does not appear to be immune mediated. No evidence of colitis at this time. It appears to be manageable with his diet.  3. Cardiomyopathy: Continues to follow with cardiology and his cardiac medications are optimized. His EF continues to be close to 20%.  4. Thyroid function surveillance: His TSH will be repeated periodically.  5. Follow-up: Will be in 2 weeks for the next cycle of therapy.   Zola Button, MD 3/9/20181:45 PM

## 2016-08-02 NOTE — Patient Instructions (Addendum)
[facility] Discharge Instructions for Patients Receiving Chemotherapy  Today you received the following chemotherapy agents nivolumab   To help prevent nausea and vomiting after your treatment, we encourage you to take your nausea medication as directed   If you develop nausea and vomiting that is not controlled by your nausea medication, call the clinic.   BELOW ARE SYMPTOMS THAT SHOULD BE REPORTED IMMEDIATELY:  *FEVER GREATER THAN 100.5 F  *CHILLS WITH OR WITHOUT FEVER  NAUSEA AND VOMITING THAT IS NOT CONTROLLED WITH YOUR NAUSEA MEDICATION  *UNUSUAL SHORTNESS OF BREATH  *UNUSUAL BRUISING OR BLEEDING  TENDERNESS IN MOUTH AND THROAT WITH OR WITHOUT PRESENCE OF ULCERS  *URINARY PROBLEMS  *BOWEL PROBLEMS  UNUSUAL RASH Items with * indicate a potential emergency and should be followed up as soon as possible.  Feel free to call the clinic you have any questions or concerns. The clinic phone number is [phone].  

## 2016-08-02 NOTE — Telephone Encounter (Signed)
Appointments scheduled per 3/9 LOS. Patient given AVS report and calendars with future scheduled appointments. °

## 2016-08-16 ENCOUNTER — Ambulatory Visit (HOSPITAL_BASED_OUTPATIENT_CLINIC_OR_DEPARTMENT_OTHER): Payer: BLUE CROSS/BLUE SHIELD

## 2016-08-16 ENCOUNTER — Other Ambulatory Visit (HOSPITAL_BASED_OUTPATIENT_CLINIC_OR_DEPARTMENT_OTHER): Payer: BLUE CROSS/BLUE SHIELD

## 2016-08-16 VITALS — BP 131/81 | HR 71 | Temp 98.3°F | Resp 18

## 2016-08-16 DIAGNOSIS — C7800 Secondary malignant neoplasm of unspecified lung: Secondary | ICD-10-CM

## 2016-08-16 DIAGNOSIS — C641 Malignant neoplasm of right kidney, except renal pelvis: Secondary | ICD-10-CM

## 2016-08-16 DIAGNOSIS — Z5112 Encounter for antineoplastic immunotherapy: Secondary | ICD-10-CM | POA: Diagnosis not present

## 2016-08-16 DIAGNOSIS — C649 Malignant neoplasm of unspecified kidney, except renal pelvis: Secondary | ICD-10-CM

## 2016-08-16 LAB — CBC WITH DIFFERENTIAL/PLATELET
BASO%: 0.6 % (ref 0.0–2.0)
Basophils Absolute: 0 10*3/uL (ref 0.0–0.1)
EOS%: 3.4 % (ref 0.0–7.0)
Eosinophils Absolute: 0.2 10*3/uL (ref 0.0–0.5)
HEMATOCRIT: 42.9 % (ref 38.4–49.9)
HGB: 15.1 g/dL (ref 13.0–17.1)
LYMPH#: 1.4 10*3/uL (ref 0.9–3.3)
LYMPH%: 25.7 % (ref 14.0–49.0)
MCH: 29.7 pg (ref 27.2–33.4)
MCHC: 35.2 g/dL (ref 32.0–36.0)
MCV: 84.4 fL (ref 79.3–98.0)
MONO#: 0.5 10*3/uL (ref 0.1–0.9)
MONO%: 8.6 % (ref 0.0–14.0)
NEUT%: 61.7 % (ref 39.0–75.0)
NEUTROS ABS: 3.3 10*3/uL (ref 1.5–6.5)
PLATELETS: 206 10*3/uL (ref 140–400)
RBC: 5.08 10*6/uL (ref 4.20–5.82)
RDW: 12.2 % (ref 11.0–14.6)
WBC: 5.3 10*3/uL (ref 4.0–10.3)
nRBC: 0 % (ref 0–0)

## 2016-08-16 LAB — COMPREHENSIVE METABOLIC PANEL
ALT: 22 U/L (ref 0–55)
AST: 20 U/L (ref 5–34)
Albumin: 4.1 g/dL (ref 3.5–5.0)
Alkaline Phosphatase: 56 U/L (ref 40–150)
Anion Gap: 11 mEq/L (ref 3–11)
BILIRUBIN TOTAL: 0.72 mg/dL (ref 0.20–1.20)
BUN: 25 mg/dL (ref 7.0–26.0)
CHLORIDE: 106 meq/L (ref 98–109)
CO2: 24 meq/L (ref 22–29)
Calcium: 9.9 mg/dL (ref 8.4–10.4)
Creatinine: 1.4 mg/dL — ABNORMAL HIGH (ref 0.7–1.3)
EGFR: 55 mL/min/{1.73_m2} — AB (ref >90)
GLUCOSE: 88 mg/dL (ref 70–140)
Potassium: 4.5 mEq/L (ref 3.5–5.1)
Sodium: 140 mEq/L (ref 136–145)
Total Protein: 7.5 g/dL (ref 6.4–8.3)

## 2016-08-16 LAB — TSH

## 2016-08-16 MED ORDER — SODIUM CHLORIDE 0.9 % IV SOLN
Freq: Once | INTRAVENOUS | Status: AC
  Administered 2016-08-16: 13:00:00 via INTRAVENOUS

## 2016-08-16 MED ORDER — SODIUM CHLORIDE 0.9 % IV SOLN
240.0000 mg | Freq: Once | INTRAVENOUS | Status: AC
  Administered 2016-08-16: 240 mg via INTRAVENOUS
  Filled 2016-08-16: qty 24

## 2016-08-16 NOTE — Patient Instructions (Signed)
[facility] Discharge Instructions for Patients Receiving Chemotherapy  Today you received the following chemotherapy agents:  Nivolumab.  To help prevent nausea and vomiting after your treatment, we encourage you to take your nausea medication as directed.   If you develop nausea and vomiting that is not controlled by your nausea medication, call the clinic.   BELOW ARE SYMPTOMS THAT SHOULD BE REPORTED IMMEDIATELY:  *FEVER GREATER THAN 100.5 F  *CHILLS WITH OR WITHOUT FEVER  NAUSEA AND VOMITING THAT IS NOT CONTROLLED WITH YOUR NAUSEA MEDICATION  *UNUSUAL SHORTNESS OF BREATH  *UNUSUAL BRUISING OR BLEEDING  TENDERNESS IN MOUTH AND THROAT WITH OR WITHOUT PRESENCE OF ULCERS  *URINARY PROBLEMS  *BOWEL PROBLEMS  UNUSUAL RASH Items with * indicate a potential emergency and should be followed up as soon as possible.  Feel free to call the clinic you have any questions or concerns. The clinic phone number is [phone].  Please show the CHEMO ALERT CARD at check-in to the Emergency Department and triage nurse.   

## 2016-09-04 DIAGNOSIS — C641 Malignant neoplasm of right kidney, except renal pelvis: Secondary | ICD-10-CM | POA: Diagnosis not present

## 2016-09-04 DIAGNOSIS — R918 Other nonspecific abnormal finding of lung field: Secondary | ICD-10-CM | POA: Diagnosis not present

## 2016-09-04 DIAGNOSIS — Z905 Acquired absence of kidney: Secondary | ICD-10-CM | POA: Diagnosis not present

## 2016-09-04 DIAGNOSIS — Z79899 Other long term (current) drug therapy: Secondary | ICD-10-CM | POA: Diagnosis not present

## 2016-09-05 DIAGNOSIS — C801 Malignant (primary) neoplasm, unspecified: Secondary | ICD-10-CM | POA: Diagnosis not present

## 2016-09-05 DIAGNOSIS — D630 Anemia in neoplastic disease: Secondary | ICD-10-CM | POA: Diagnosis not present

## 2016-09-05 DIAGNOSIS — F411 Generalized anxiety disorder: Secondary | ICD-10-CM | POA: Diagnosis not present

## 2016-09-05 DIAGNOSIS — R918 Other nonspecific abnormal finding of lung field: Secondary | ICD-10-CM | POA: Diagnosis not present

## 2016-09-05 DIAGNOSIS — C649 Malignant neoplasm of unspecified kidney, except renal pelvis: Secondary | ICD-10-CM | POA: Diagnosis not present

## 2016-09-06 ENCOUNTER — Telehealth: Payer: Self-pay | Admitting: Oncology

## 2016-09-06 ENCOUNTER — Ambulatory Visit (HOSPITAL_BASED_OUTPATIENT_CLINIC_OR_DEPARTMENT_OTHER): Payer: BLUE CROSS/BLUE SHIELD

## 2016-09-06 ENCOUNTER — Other Ambulatory Visit (HOSPITAL_BASED_OUTPATIENT_CLINIC_OR_DEPARTMENT_OTHER): Payer: BLUE CROSS/BLUE SHIELD

## 2016-09-06 ENCOUNTER — Ambulatory Visit (HOSPITAL_BASED_OUTPATIENT_CLINIC_OR_DEPARTMENT_OTHER): Payer: BLUE CROSS/BLUE SHIELD | Admitting: Oncology

## 2016-09-06 VITALS — BP 123/78 | HR 75 | Temp 98.0°F | Resp 18 | Ht 74.0 in | Wt 258.6 lb

## 2016-09-06 DIAGNOSIS — C7800 Secondary malignant neoplasm of unspecified lung: Secondary | ICD-10-CM

## 2016-09-06 DIAGNOSIS — I429 Cardiomyopathy, unspecified: Secondary | ICD-10-CM

## 2016-09-06 DIAGNOSIS — R197 Diarrhea, unspecified: Secondary | ICD-10-CM | POA: Diagnosis not present

## 2016-09-06 DIAGNOSIS — Z5112 Encounter for antineoplastic immunotherapy: Secondary | ICD-10-CM

## 2016-09-06 DIAGNOSIS — C641 Malignant neoplasm of right kidney, except renal pelvis: Secondary | ICD-10-CM | POA: Diagnosis not present

## 2016-09-06 DIAGNOSIS — C649 Malignant neoplasm of unspecified kidney, except renal pelvis: Secondary | ICD-10-CM

## 2016-09-06 LAB — CBC WITH DIFFERENTIAL/PLATELET
BASO%: 0.8 % (ref 0.0–2.0)
BASOS ABS: 0 10*3/uL (ref 0.0–0.1)
EOS ABS: 0.2 10*3/uL (ref 0.0–0.5)
EOS%: 3.6 % (ref 0.0–7.0)
HCT: 42.7 % (ref 38.4–49.9)
HGB: 14.8 g/dL (ref 13.0–17.1)
LYMPH%: 21.6 % (ref 14.0–49.0)
MCH: 29.8 pg (ref 27.2–33.4)
MCHC: 34.6 g/dL (ref 32.0–36.0)
MCV: 86 fL (ref 79.3–98.0)
MONO#: 0.4 10*3/uL (ref 0.1–0.9)
MONO%: 7.8 % (ref 0.0–14.0)
NEUT#: 3.6 10*3/uL (ref 1.5–6.5)
NEUT%: 66.2 % (ref 39.0–75.0)
Platelets: 198 10*3/uL (ref 140–400)
RBC: 4.97 10*6/uL (ref 4.20–5.82)
RDW: 12.3 % (ref 11.0–14.6)
WBC: 5.4 10*3/uL (ref 4.0–10.3)
lymph#: 1.2 10*3/uL (ref 0.9–3.3)

## 2016-09-06 LAB — COMPREHENSIVE METABOLIC PANEL
ALT: 20 U/L (ref 0–55)
AST: 22 U/L (ref 5–34)
Albumin: 4 g/dL (ref 3.5–5.0)
Alkaline Phosphatase: 47 U/L (ref 40–150)
Anion Gap: 7 mEq/L (ref 3–11)
BUN: 22.7 mg/dL (ref 7.0–26.0)
CO2: 28 meq/L (ref 22–29)
Calcium: 9.6 mg/dL (ref 8.4–10.4)
Chloride: 105 mEq/L (ref 98–109)
Creatinine: 1.5 mg/dL — ABNORMAL HIGH (ref 0.7–1.3)
EGFR: 52 mL/min/{1.73_m2} — AB (ref >90)
GLUCOSE: 92 mg/dL (ref 70–140)
Potassium: 4.2 mEq/L (ref 3.5–5.1)
SODIUM: 140 meq/L (ref 136–145)
Total Bilirubin: 0.89 mg/dL (ref 0.20–1.20)
Total Protein: 7.1 g/dL (ref 6.4–8.3)

## 2016-09-06 MED ORDER — SODIUM CHLORIDE 0.9 % IV SOLN
Freq: Once | INTRAVENOUS | Status: AC
  Administered 2016-09-06: 13:00:00 via INTRAVENOUS

## 2016-09-06 MED ORDER — NIVOLUMAB CHEMO INJECTION 100 MG/10ML
240.0000 mg | Freq: Once | INTRAVENOUS | Status: AC
  Administered 2016-09-06: 240 mg via INTRAVENOUS
  Filled 2016-09-06: qty 24

## 2016-09-06 NOTE — Patient Instructions (Signed)
[facility] Discharge Instructions for Patients  Today you received the following: Nivolumab   To help prevent nausea and vomiting after your treatment, we encourage you to take your nausea medication as directed.    If you develop nausea and vomiting that is not controlled by your nausea medication, call the clinic.   BELOW ARE SYMPTOMS THAT SHOULD BE REPORTED IMMEDIATELY:  *FEVER GREATER THAN 100.5 F  *CHILLS WITH OR WITHOUT FEVER  NAUSEA AND VOMITING THAT IS NOT CONTROLLED WITH YOUR NAUSEA MEDICATION  *UNUSUAL SHORTNESS OF BREATH  *UNUSUAL BRUISING OR BLEEDING  TENDERNESS IN MOUTH AND THROAT WITH OR WITHOUT PRESENCE OF ULCERS  *URINARY PROBLEMS  *BOWEL PROBLEMS  UNUSUAL RASH Items with * indicate a potential emergency and should be followed up as soon as possible.  Feel free to call the clinic you have any questions or concerns. The clinic phone number is [phone].  Please show the CHEMO ALERT CARD at check-in to the Emergency Department and triage nurse.   

## 2016-09-06 NOTE — Telephone Encounter (Signed)
Gave patient AVS and calender per 4/13 los.  

## 2016-09-06 NOTE — Progress Notes (Signed)
Hematology and Oncology Follow Up Visit  Gregory [name] [id number] Jun 25, 1960 56 y.o. 09/06/2016 11:52 AM [name], [name], Fransico Him, MD   Principle Diagnosis: 56 year old gentleman with renal cell cancer diagnosed in August 2017. He was found to have pulmonary nodules and currently has metastatic disease.   Prior Therapy: He is status post radical nephrectomy done on 02/07/2016 at [facility]. 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 The measurements of the tumor is 9.9 x 9.0 x 9.0 cm. He did have small pulmonary nodules and a repeat scan on 03/25/2016 showed a 0.6 mm left lower lobe nodule previously measuring 3 mm. There is a 1 cm left lower lobe nodule previously was 0.5.  Current therapy:  Nivolumab 240 mg every 2 weeks started on 06/07/2016. This is his first-line therapy chosen because of cardiomyopathy.  He is here for cycle 5 of therapy.  Interim History: [name] presents today for a follow-up visit. Since the last visit, he reports no recent changes in his health. He continues to tolerate treatment without any major complications. He denied any nausea, abdominal pain or dyspepsia. He denied any pulmonary symptoms including cough, dyspnea and exertion or difficulty breathing. He does report some dry skin periodically but no pruritus or rash. He continues to work full-time and attends to activities of daily living. He does report occasional loose bowel habits although no severe diarrhea. The symptoms have been manageable.  He does not report any headaches or blurry vision, syncope or seizures. He does not report any fevers, chills, sweats or weight loss. His appetite is excellent and have gained weight. He does not report any chest pain, palpitation, orthopnea or leg edema. He does not report any cough, wheezing or hemoptysis. He does not report any nausea, vomiting or abdominal pain. He does not report any frequency urgency or  hesitancy. Remaining review of systems unremarkable.   Medications: I have reviewed the patient's current medications.  Current Outpatient Prescriptions  Medication Sig Dispense Refill  . carvedilol (COREG) 3.125 MG tablet Take 1 tablet (3.125 mg total) by mouth 2 (two) times daily. 60 tablet 3  . cetirizine (ZYRTEC) 10 MG tablet Take 10 mg by mouth daily.    . cyanocobalamin 500 MCG tablet Take 500 mcg by mouth daily.    . diphenoxylate-atropine (LOMOTIL) 2.5-0.025 MG tablet Take 2 tablets by mouth 4 (four) times daily as needed for diarrhea or loose stools. 30 tablet 0  . loperamide (IMODIUM) 2 MG capsule Take 2 mg by mouth as needed for diarrhea or loose stools.    Marland Kitchen losartan (COZAAR) 50 MG tablet Take 1 tablet (50 mg total) by mouth daily. 30 tablet 3  . PARoxetine (PAXIL-CR) 25 MG 24 hr tablet Take 25 mg by mouth daily.     No current facility-administered medications for this visit.      Allergies: No Known Allergies  Past Medical History, Surgical history, Social history, and Family History were reviewed and updated.   Physical Exam: Blood pressure 123/78, pulse 75, temperature 98 F (36.7 C), temperature source Oral, resp. rate 18, height 6\' 2"  (1.88 m), weight 258 lb 9.6 oz (117.3 kg), SpO2 98 %. ECOG: 0 General appearance: Well-appearing gentleman without distress. Head: Normocephalic, without obvious abnormality no oral thrush or ulcers. Neck: no adenopathy Lymph nodes: Cervical, supraclavicular, and axillary nodes normal. Heart:regular rate and rhythm, S1, S2 normal, no murmur, click, rub or gallop Lung:chest clear, no wheezing, rales, normal symmetric  air entry Abdomin: soft, non-tender, without masses or organomegaly no shifting dullness or ascites. EXT:no erythema, induration, or nodules   Lab Results: Lab Results  Component Value Date   WBC 5.4 09/06/2016   HGB 14.8 09/06/2016   HCT 42.7 09/06/2016   MCV 86.0 09/06/2016   PLT 198 09/06/2016     Chemistry       Component Value Date/Time   NA 140 08/16/2016 1208   K 4.5 08/16/2016 1208   CL 107 04/17/2016 1400   CO2 24 08/16/2016 1208   BUN 25.0 08/16/2016 1208   CREATININE 1.4 (H) 08/16/2016 1208      Component Value Date/Time   CALCIUM 9.9 08/16/2016 1208   ALKPHOS 56 08/16/2016 1208   AST 20 08/16/2016 1208   ALT 22 08/16/2016 1208   BILITOT 0.72 08/16/2016 1208     CT scan of the chest abdomen and pelvis obtained on 07/03/2016 and [facility] [place] was personally reviewed today. The scan did show left lower lobe pulmonary nodule that have slightly increased in size. He has a nodule measuring 15 mm and another one measuring 9.5 mm. There are additional pulmonary nodules on the long not visible on 03/25/2012. There is a right middle lobe nodule measuring 5 mm. There is no other areas of metastasis noted.   Impression and Plan: 56 year old with the following issues:  1. 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 He is status post radical nephrectomy done on 02/07/2016 at [facility]. 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 The measurements of the tumor is 9.9 x 9.0 x 9.0 cm. He did have small pulmonary nodules   He is currently receiving Nivolumab 240 mg every 2 weeks started on 06/07/2016.   His most recent CT scan obtained in April 2018 at [facility] indicate stable disease. The plan is to continue with the same dose and schedule and repeat imaging studies in 3 months.  2. Diarrhea: Manageable at this time with very little to no diarrhea noted.  3. Cardiomyopathy: Continues to follow with cardiology and his cardiac medications are optimized. His EF continues to be close to 20%.  4. Thyroid function surveillance: His TSH will be repeated periodically.  5. Follow-up: Will be in 2 weeks for the next cycle of therapy.   [place] [place] [facility], MD 4/13/201811:52 AM

## 2016-09-12 ENCOUNTER — Ambulatory Visit (HOSPITAL_BASED_OUTPATIENT_CLINIC_OR_DEPARTMENT_OTHER)
Admission: RE | Admit: 2016-09-12 | Discharge: 2016-09-12 | Disposition: A | Discharge status: Home / Self Care | Payer: BLUE CROSS/BLUE SHIELD | Source: Ambulatory Visit | Attending: Internal Medicine | Admitting: Internal Medicine

## 2016-09-12 ENCOUNTER — Ambulatory Visit (HOSPITAL_COMMUNITY)
Admission: RE | Admit: 2016-09-12 | Discharge: 2016-09-12 | Disposition: A | Discharge status: Home / Self Care | Payer: BLUE CROSS/BLUE SHIELD | Source: Ambulatory Visit | Attending: Internal Medicine | Admitting: Internal Medicine

## 2016-09-12 ENCOUNTER — Encounter (HOSPITAL_COMMUNITY): Payer: Self-pay | Admitting: Internal Medicine

## 2016-09-12 VITALS — BP 144/75 | HR 74 | Wt 253.2 lb

## 2016-09-12 DIAGNOSIS — I5022 Chronic systolic (congestive) heart failure: Secondary | ICD-10-CM | POA: Diagnosis not present

## 2016-09-12 DIAGNOSIS — F419 Anxiety disorder, unspecified: Secondary | ICD-10-CM | POA: Insufficient documentation

## 2016-09-12 DIAGNOSIS — I10 Essential (primary) hypertension: Secondary | ICD-10-CM | POA: Diagnosis not present

## 2016-09-12 DIAGNOSIS — I110 Hypertensive heart disease with heart failure: Secondary | ICD-10-CM | POA: Diagnosis not present

## 2016-09-12 DIAGNOSIS — Z85528 Personal history of other malignant neoplasm of kidney: Secondary | ICD-10-CM | POA: Diagnosis not present

## 2016-09-12 DIAGNOSIS — Z79899 Other long term (current) drug therapy: Secondary | ICD-10-CM | POA: Insufficient documentation

## 2016-09-12 DIAGNOSIS — E781 Pure hyperglyceridemia: Secondary | ICD-10-CM | POA: Diagnosis not present

## 2016-09-12 DIAGNOSIS — Z905 Acquired absence of kidney: Secondary | ICD-10-CM | POA: Diagnosis not present

## 2016-09-12 DIAGNOSIS — Z8249 Family history of ischemic heart disease and other diseases of the circulatory system: Secondary | ICD-10-CM | POA: Diagnosis not present

## 2016-09-12 DIAGNOSIS — R918 Other nonspecific abnormal finding of lung field: Secondary | ICD-10-CM | POA: Diagnosis not present

## 2016-09-12 DIAGNOSIS — I428 Other cardiomyopathies: Secondary | ICD-10-CM | POA: Insufficient documentation

## 2016-09-12 DIAGNOSIS — R0683 Snoring: Secondary | ICD-10-CM | POA: Insufficient documentation

## 2016-09-12 DIAGNOSIS — Z818 Family history of other mental and behavioral disorders: Secondary | ICD-10-CM | POA: Diagnosis not present

## 2016-09-12 MED ORDER — SACUBITRIL-VALSARTAN 49-51 MG PO TABS: 1.0000 | ORAL_TABLET | Freq: Two times a day (BID) | ORAL | 3 refills | Status: DC

## 2016-09-12 NOTE — Addendum Note (Signed)
Encounter addended by: Scarlette Calico, RN on: 09/12/2016  3:26 PM<BR>    Actions taken: Medication long-term status modified, Order list changed, Diagnosis association updated, Sign clinical note

## 2016-09-12 NOTE — Progress Notes (Signed)
ADVANCED HF CLINIC NOTE  Referring Physician:  Lennie Hummer MD  Primary Cardiologist: None  HPI:  Gregory [name] is a 56 y/o male with h/o morbid obesity, R renal cell CA s/p R nephrectomy in 9/17 referred for further evaluation of newly-discovered cardiomyopathy.  Developed microscopic hematuria and found to have large right renal mass c/w renal cell CA. Eventually underwent right radical nephrectomy with adrenal sparing on 02/07/16 at [facility]. Post-op developed bradycardia with HRs in 30s while sleeping. BP stable. When awakened had dizziness so given atropine. ECG revealed sinus brady in 40s with mild QT prolongation which resolved. Echo done and EF 45% with global HK. RV dilated  With normal function .  We saw him for the first time in October 2017. At that time we ordered cMRI and stress test. However in the interim he went back to [facility]. Found to have 2 lung nodules and wanted to start chemo. Echo showed EF at 46%. However MUGA with EF 28% so referred back to [place] for further evaluation prior to chemo. Underwent cath 04/09/16 as below.   1. 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 Otherwise normal coronaries 3. 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 Remains active. Denies dyspnea, orthopnea or PND. BP well controlled. Getting Opdevo.   Overall feeling well, no SOB with walking. No SOB at rest. Does not weigh himself at home. Going to buy a scale today. Taking all medications. No orthopnea, bendopnea. His job requires a great amount of walking and he feels ok with that. Eating some high salt foods, drinks about 4-5 alcoholic drinks a week, does not smoke. Does not exercise regularly. Able to lie flat for sleep.   ECHO 09/12/16 35-40%, RV ok. - Reviewed personally   FHx:   Father died at 52 from HF  (unknown CAD status) Mother died 60 from brain cancer Sister fine    Past Medical History:  Diagnosis Date  . Allergy   . Anxiety   . History of gastrointestinal hemorrhage   . Hypertension   . Hypertriglyceridemia   . Macular degeneration    lasar surgery- Dr Zigmund Maretta [place]  . Plantar warts   . PONV (postoperative nausea and vomiting)     Current Outpatient Prescriptions  Medication Sig Dispense Refill  . carvedilol (COREG) 3.125 MG tablet Take 1 tablet (3.125 mg total) by mouth 2 (two) times daily. 60 tablet 3  . cetirizine (ZYRTEC) 10 MG tablet Take 10 mg by mouth daily.    . cyanocobalamin 500 MCG tablet Take 500 mcg by mouth daily.    . diphenoxylate-atropine (LOMOTIL) 2.5-0.025 MG tablet Take 2 tablets by mouth 4 (four) times daily as needed for diarrhea or loose stools. 30 tablet 0  . loperamide (IMODIUM) 2 MG capsule Take 2 mg by mouth as needed for diarrhea or loose stools.    Marland Kitchen losartan (COZAAR) 50 MG tablet Take 1 tablet (50 mg total) by mouth daily. 30 tablet 3  . PARoxetine (PAXIL-CR) 25 MG 24 hr tablet Take 25 mg by mouth daily.     No current facility-administered medications for this encounter.     No Known Allergies    Social History   Social History  . Marital status: Married    Spouse name: N/A  . Number of children: N/A  . Years of education: N/A  Occupational History  . Not on file.   Social History Main Topics  . Smoking status: Never Smoker  . Smokeless tobacco: Never Used  . Alcohol use 0.0 oz/week     Comment: social  . Drug use: No  . Sexual activity: Not on file   Other Topics Concern  . 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       Family History  Problem Relation Age of Onset  . Hypertension Mother   . Bipolar disorder Mother   . Other Father     brain  tumor  . Colon cancer Neg Hx   . Colon polyps Neg Hx   . Rectal cancer Neg Hx   . Stomach cancer Neg Hx     Vitals:   09/12/16 1352  BP: (!) 144/75  Pulse: 74  SpO2: 99%  Weight: 253 lb 4 oz (114.9 kg)    PHYSICAL EXAM: General: Well appearing male in NAD.  HEENT: normocephalic, PERRLA.  Neck: supple. No JVD. Carotids 2+ bilat; no bruits. No lymphadenopathy or thryomegaly appreciated. Cor: PMI nondisplaced. Regular rate & rhythm. No murmurs, rubs or gallops.  Lungs: Clear in all lobes, no wheezes Abdomen: obese soft, nontender, nondistended. No hepatosplenomegaly. No bruits or masses. Good bowel sounds Extremities: no cyanosis, clubbing, rash, edema Neuro: alert & oriented x 3, cranial nerves grossly intact. moves all 4 extremities w/o difficulty. Affect pleasant.  ECG: Sinus brady 42 QRS 96 QTC 454ms (02/07/16)              NSR 76 QRS 80 QTc 459 No ST-T wave abnormalities. (today)   ASSESSMENT & PLAN: 1. NICM: Recently diagnosed.     -EF 30-35% by cath with NICM. cMRI 1/18 with EF 28%. Etiology remains unclear. No evidence of scar of infiltrative process on MRI.  -- Echo today EF ~40% - NYHA II, volume status stable.  -Continue carvedilol at 3.125 bid HR too low to titrate - Switch losartan to Entresto 49/51 mg BID.  - Encouraged him to weigh daily, avoid high salt foods.  2. Metastatic Renal Cell CA with lung nodules   --s/p nephrectomy. Currently on biological therapy. 3. HTN:  -BP elevated - Start Entresto as above.  4. Snoring - Recent sleep study negative.   Arbutus Leas, NP-C  2:19 PM  Patient seen and examined with Jettie Booze, NP. We discussed all aspects of the encounter. I agree with the assessment and plan as stated above.   Doing well NYHA I-II. Volume status looks good. Echo reviewed EF ~40%. Normal RV. Will switch losartan to Entresto 49/51.   Glori Bickers, MD  3:17 PM

## 2016-09-12 NOTE — Patient Instructions (Signed)
Stop Losartan  Start Entresto 49/51 mg Twice daily   Labs in 10 days  We will contact you in 4 months to schedule your next appointment.

## 2016-09-12 NOTE — Progress Notes (Signed)
  Echocardiogram 2D Echocardiogram has been performed.  Drenda Sobecki L Androw 09/12/2016, 1:42 PM

## 2016-09-13 ENCOUNTER — Telehealth (HOSPITAL_COMMUNITY): Payer: Self-pay | Admitting: Pharmacist

## 2016-09-13 NOTE — Telephone Encounter (Signed)
Entresto 49-51 mg BID PA approved by [employer] through 05/26/38.   Ruta Hinds. Velva Harman, PharmD, BCPS, CPP Clinical Pharmacist Pager: [phone] Phone: [phone] 09/13/2016 2:50 PM

## 2016-09-16 ENCOUNTER — Other Ambulatory Visit (HOSPITAL_COMMUNITY): Payer: Self-pay | Admitting: Internal Medicine

## 2016-09-20 ENCOUNTER — Ambulatory Visit (HOSPITAL_BASED_OUTPATIENT_CLINIC_OR_DEPARTMENT_OTHER): Payer: BLUE CROSS/BLUE SHIELD

## 2016-09-20 ENCOUNTER — Other Ambulatory Visit: Payer: BLUE CROSS/BLUE SHIELD

## 2016-09-20 VITALS — BP 143/88 | HR 72 | Temp 98.2°F | Resp 18

## 2016-09-20 DIAGNOSIS — C649 Malignant neoplasm of unspecified kidney, except renal pelvis: Secondary | ICD-10-CM

## 2016-09-20 DIAGNOSIS — C641 Malignant neoplasm of right kidney, except renal pelvis: Secondary | ICD-10-CM

## 2016-09-20 DIAGNOSIS — Z5189 Encounter for other specified aftercare: Secondary | ICD-10-CM | POA: Diagnosis not present

## 2016-09-20 DIAGNOSIS — C7800 Secondary malignant neoplasm of unspecified lung: Secondary | ICD-10-CM | POA: Diagnosis not present

## 2016-09-20 LAB — CBC WITH DIFFERENTIAL/PLATELET
BASO%: 0.2 % (ref 0.0–2.0)
Basophils Absolute: 0 10*3/uL (ref 0.0–0.1)
EOS ABS: 0.2 10*3/uL (ref 0.0–0.5)
EOS%: 3.5 % (ref 0.0–7.0)
HCT: 44.9 % (ref 38.4–49.9)
HEMOGLOBIN: 15.7 g/dL (ref 13.0–17.1)
LYMPH%: 24.6 % (ref 14.0–49.0)
MCH: 29.8 pg (ref 27.2–33.4)
MCHC: 35 g/dL (ref 32.0–36.0)
MCV: 85.4 fL (ref 79.3–98.0)
MONO#: 0.5 10*3/uL (ref 0.1–0.9)
MONO%: 10.2 % (ref 0.0–14.0)
NEUT%: 61.5 % (ref 39.0–75.0)
NEUTROS ABS: 3.2 10*3/uL (ref 1.5–6.5)
Platelets: 181 10*3/uL (ref 140–400)
RBC: 5.26 10*6/uL (ref 4.20–5.82)
RDW: 12.3 % (ref 11.0–14.6)
WBC: 5.2 10*3/uL (ref 4.0–10.3)
lymph#: 1.3 10*3/uL (ref 0.9–3.3)

## 2016-09-20 LAB — COMPREHENSIVE METABOLIC PANEL
ALBUMIN: 4.1 g/dL (ref 3.5–5.0)
ALK PHOS: 59 U/L (ref 40–150)
ALT: 20 U/L (ref 0–55)
AST: 21 U/L (ref 5–34)
Anion Gap: 9 mEq/L (ref 3–11)
BILIRUBIN TOTAL: 0.62 mg/dL (ref 0.20–1.20)
BUN: 25.7 mg/dL (ref 7.0–26.0)
CO2: 24 meq/L (ref 22–29)
CREATININE: 1.6 mg/dL — AB (ref 0.7–1.3)
Calcium: 9.8 mg/dL (ref 8.4–10.4)
Chloride: 106 mEq/L (ref 98–109)
EGFR: 49 mL/min/{1.73_m2} — AB (ref >90)
GLUCOSE: 95 mg/dL (ref 70–140)
Potassium: 4.7 mEq/L (ref 3.5–5.1)
SODIUM: 139 meq/L (ref 136–145)
TOTAL PROTEIN: 7.5 g/dL (ref 6.4–8.3)

## 2016-09-20 MED ORDER — SODIUM CHLORIDE 0.9 % IV SOLN
Freq: Once | INTRAVENOUS | Status: AC
  Administered 2016-09-20: 12:00:00 via INTRAVENOUS

## 2016-09-20 MED ORDER — SODIUM CHLORIDE 0.9 % IV SOLN
240.0000 mg | Freq: Once | INTRAVENOUS | Status: AC
  Administered 2016-09-20: 240 mg via INTRAVENOUS
  Filled 2016-09-20: qty 24

## 2016-09-20 NOTE — Patient Instructions (Signed)
Cancer Center Discharge Instructions for Patients Receiving Chemotherapy  Today you received the following chemotherapy agents:  Nivolumab.  To help prevent nausea and vomiting after your treatment, we encourage you to take your nausea medication as directed.   If you develop nausea and vomiting that is not controlled by your nausea medication, call the clinic.   BELOW ARE SYMPTOMS THAT SHOULD BE REPORTED IMMEDIATELY:  *FEVER GREATER THAN 100.5 F  *CHILLS WITH OR WITHOUT FEVER  NAUSEA AND VOMITING THAT IS NOT CONTROLLED WITH YOUR NAUSEA MEDICATION  *UNUSUAL SHORTNESS OF BREATH  *UNUSUAL BRUISING OR BLEEDING  TENDERNESS IN MOUTH AND THROAT WITH OR WITHOUT PRESENCE OF ULCERS  *URINARY PROBLEMS  *BOWEL PROBLEMS  UNUSUAL RASH Items with * indicate a potential emergency and should be followed up as soon as possible.  Feel free to call the clinic you have any questions or concerns. The clinic phone number is [phone].  Please show the CHEMO ALERT CARD at check-in to the Emergency Department and triage nurse.   

## 2016-09-20 NOTE — Progress Notes (Signed)
Per Dr. Alen Blew it is okay to treat pt today with Nivolumab and serum creatinine of 1.6.

## 2016-09-23 ENCOUNTER — Ambulatory Visit (HOSPITAL_COMMUNITY)
Admission: RE | Admit: 2016-09-23 | Discharge: 2016-09-23 | Disposition: A | Discharge status: Home / Self Care | Payer: BLUE CROSS/BLUE SHIELD | Source: Ambulatory Visit | Attending: Cardiology | Admitting: Cardiology

## 2016-09-23 DIAGNOSIS — I5022 Chronic systolic (congestive) heart failure: Secondary | ICD-10-CM | POA: Insufficient documentation

## 2016-09-23 LAB — BASIC METABOLIC PANEL
ANION GAP: 7 (ref 5–15)
BUN: 24 mg/dL — ABNORMAL HIGH (ref 6–20)
CALCIUM: 9.3 mg/dL (ref 8.9–10.3)
CO2: 24 mmol/L (ref 22–32)
Chloride: 105 mmol/L (ref 101–111)
Creatinine, Ser: 1.48 mg/dL — ABNORMAL HIGH (ref 0.61–1.24)
GFR, EST AFRICAN AMERICAN: 59 mL/min — AB (ref >60)
GFR, EST NON AFRICAN AMERICAN: 51 mL/min — AB (ref >60)
GLUCOSE: 97 mg/dL (ref 65–99)
POTASSIUM: 4 mmol/L (ref 3.5–5.1)
SODIUM: 136 mmol/L (ref 135–145)

## 2016-10-04 ENCOUNTER — Other Ambulatory Visit (HOSPITAL_BASED_OUTPATIENT_CLINIC_OR_DEPARTMENT_OTHER): Payer: BLUE CROSS/BLUE SHIELD

## 2016-10-04 ENCOUNTER — Ambulatory Visit (HOSPITAL_BASED_OUTPATIENT_CLINIC_OR_DEPARTMENT_OTHER): Payer: BLUE CROSS/BLUE SHIELD

## 2016-10-04 VITALS — BP 122/77 | HR 62 | Temp 98.2°F | Resp 17

## 2016-10-04 DIAGNOSIS — C641 Malignant neoplasm of right kidney, except renal pelvis: Secondary | ICD-10-CM

## 2016-10-04 DIAGNOSIS — Z5112 Encounter for antineoplastic immunotherapy: Secondary | ICD-10-CM

## 2016-10-04 DIAGNOSIS — C7800 Secondary malignant neoplasm of unspecified lung: Secondary | ICD-10-CM | POA: Diagnosis not present

## 2016-10-04 DIAGNOSIS — C649 Malignant neoplasm of unspecified kidney, except renal pelvis: Secondary | ICD-10-CM

## 2016-10-04 LAB — CBC WITH DIFFERENTIAL/PLATELET
BASO%: 0.8 % (ref 0.0–2.0)
Basophils Absolute: 0 10*3/uL (ref 0.0–0.1)
EOS%: 4 % (ref 0.0–7.0)
Eosinophils Absolute: 0.2 10*3/uL (ref 0.0–0.5)
HCT: 44.3 % (ref 38.4–49.9)
HEMOGLOBIN: 15.1 g/dL (ref 13.0–17.1)
LYMPH%: 23.5 % (ref 14.0–49.0)
MCH: 29.6 pg (ref 27.2–33.4)
MCHC: 34.2 g/dL (ref 32.0–36.0)
MCV: 86.7 fL (ref 79.3–98.0)
MONO#: 0.5 10*3/uL (ref 0.1–0.9)
MONO%: 9.3 % (ref 0.0–14.0)
NEUT%: 62.4 % (ref 39.0–75.0)
NEUTROS ABS: 3.4 10*3/uL (ref 1.5–6.5)
Platelets: 192 10*3/uL (ref 140–400)
RBC: 5.11 10*6/uL (ref 4.20–5.82)
RDW: 12.6 % (ref 11.0–14.6)
WBC: 5.4 10*3/uL (ref 4.0–10.3)
lymph#: 1.3 10*3/uL (ref 0.9–3.3)

## 2016-10-04 LAB — COMPREHENSIVE METABOLIC PANEL
ALK PHOS: 55 U/L (ref 40–150)
ALT: 21 U/L (ref 0–55)
ANION GAP: 9 meq/L (ref 3–11)
AST: 23 U/L (ref 5–34)
Albumin: 4.1 g/dL (ref 3.5–5.0)
BUN: 23.5 mg/dL (ref 7.0–26.0)
CHLORIDE: 105 meq/L (ref 98–109)
CO2: 25 mEq/L (ref 22–29)
Calcium: 9.8 mg/dL (ref 8.4–10.4)
Creatinine: 1.5 mg/dL — ABNORMAL HIGH (ref 0.7–1.3)
EGFR: 51 mL/min/{1.73_m2} — AB (ref >90)
Glucose: 98 mg/dl (ref 70–140)
POTASSIUM: 4.7 meq/L (ref 3.5–5.1)
SODIUM: 138 meq/L (ref 136–145)
TOTAL PROTEIN: 7.3 g/dL (ref 6.4–8.3)
Total Bilirubin: 0.75 mg/dL (ref 0.20–1.20)

## 2016-10-04 MED ORDER — SODIUM CHLORIDE 0.9 % IV SOLN
240.0000 mg | Freq: Once | INTRAVENOUS | Status: AC
  Administered 2016-10-04: 240 mg via INTRAVENOUS
  Filled 2016-10-04: qty 24

## 2016-10-04 MED ORDER — SODIUM CHLORIDE 0.9 % IV SOLN
Freq: Once | INTRAVENOUS | Status: AC
  Administered 2016-10-04: 12:00:00 via INTRAVENOUS

## 2016-10-04 NOTE — Patient Instructions (Signed)
[facility] Discharge Instructions for Patients Receiving Chemotherapy  Today you received the following chemotherapy agents:  Opdivo (nivolumab)  To help prevent nausea and vomiting after your treatment, we encourage you to take your nausea medication as prescribed.   If you develop nausea and vomiting that is not controlled by your nausea medication, call the clinic.   BELOW ARE SYMPTOMS THAT SHOULD BE REPORTED IMMEDIATELY:  *FEVER GREATER THAN 100.5 F  *CHILLS WITH OR WITHOUT FEVER  NAUSEA AND VOMITING THAT IS NOT CONTROLLED WITH YOUR NAUSEA MEDICATION  *UNUSUAL SHORTNESS OF BREATH  *UNUSUAL BRUISING OR BLEEDING  TENDERNESS IN MOUTH AND THROAT WITH OR WITHOUT PRESENCE OF ULCERS  *URINARY PROBLEMS  *BOWEL PROBLEMS  UNUSUAL RASH Items with * indicate a potential emergency and should be followed up as soon as possible.  Feel free to call the clinic you have any questions or concerns. The clinic phone number is [phone].  Please show the CHEMO ALERT CARD at check-in to the Emergency Department and triage nurse.   

## 2016-10-18 ENCOUNTER — Ambulatory Visit (HOSPITAL_BASED_OUTPATIENT_CLINIC_OR_DEPARTMENT_OTHER): Payer: BLUE CROSS/BLUE SHIELD

## 2016-10-18 ENCOUNTER — Other Ambulatory Visit (HOSPITAL_BASED_OUTPATIENT_CLINIC_OR_DEPARTMENT_OTHER): Payer: BLUE CROSS/BLUE SHIELD

## 2016-10-18 ENCOUNTER — Ambulatory Visit (HOSPITAL_BASED_OUTPATIENT_CLINIC_OR_DEPARTMENT_OTHER): Payer: BLUE CROSS/BLUE SHIELD | Admitting: Oncology

## 2016-10-18 ENCOUNTER — Telehealth: Payer: Self-pay | Admitting: Oncology

## 2016-10-18 VITALS — BP 125/79 | HR 65 | Temp 97.7°F | Resp 18 | Ht 74.0 in | Wt 252.1 lb

## 2016-10-18 DIAGNOSIS — I429 Cardiomyopathy, unspecified: Secondary | ICD-10-CM | POA: Diagnosis not present

## 2016-10-18 DIAGNOSIS — C649 Malignant neoplasm of unspecified kidney, except renal pelvis: Secondary | ICD-10-CM

## 2016-10-18 DIAGNOSIS — C7800 Secondary malignant neoplasm of unspecified lung: Secondary | ICD-10-CM

## 2016-10-18 DIAGNOSIS — Z5112 Encounter for antineoplastic immunotherapy: Secondary | ICD-10-CM | POA: Diagnosis not present

## 2016-10-18 LAB — CBC WITH DIFFERENTIAL/PLATELET
BASO%: 0.8 % (ref 0.0–2.0)
Basophils Absolute: 0 10*3/uL (ref 0.0–0.1)
EOS ABS: 0.2 10*3/uL (ref 0.0–0.5)
EOS%: 3.2 % (ref 0.0–7.0)
HEMATOCRIT: 45.1 % (ref 38.4–49.9)
HEMOGLOBIN: 15.4 g/dL (ref 13.0–17.1)
LYMPH#: 1.3 10*3/uL (ref 0.9–3.3)
LYMPH%: 24 % (ref 14.0–49.0)
MCH: 30.1 pg (ref 27.2–33.4)
MCHC: 34.1 g/dL (ref 32.0–36.0)
MCV: 88.4 fL (ref 79.3–98.0)
MONO#: 0.5 10*3/uL (ref 0.1–0.9)
MONO%: 8.5 % (ref 0.0–14.0)
NEUT%: 63.5 % (ref 39.0–75.0)
NEUTROS ABS: 3.4 10*3/uL (ref 1.5–6.5)
Platelets: 192 10*3/uL (ref 140–400)
RBC: 5.1 10*6/uL (ref 4.20–5.82)
RDW: 13.6 % (ref 11.0–14.6)
WBC: 5.4 10*3/uL (ref 4.0–10.3)

## 2016-10-18 LAB — COMPREHENSIVE METABOLIC PANEL
ALBUMIN: 4.2 g/dL (ref 3.5–5.0)
ALK PHOS: 56 U/L (ref 40–150)
ALT: 19 U/L (ref 0–55)
ANION GAP: 8 meq/L (ref 3–11)
AST: 21 U/L (ref 5–34)
BILIRUBIN TOTAL: 0.81 mg/dL (ref 0.20–1.20)
BUN: 26.2 mg/dL — ABNORMAL HIGH (ref 7.0–26.0)
CO2: 27 mEq/L (ref 22–29)
Calcium: 9.9 mg/dL (ref 8.4–10.4)
Chloride: 103 mEq/L (ref 98–109)
Creatinine: 1.6 mg/dL — ABNORMAL HIGH (ref 0.7–1.3)
EGFR: 48 mL/min/{1.73_m2} — AB (ref >90)
GLUCOSE: 92 mg/dL (ref 70–140)
Potassium: 4.1 mEq/L (ref 3.5–5.1)
Sodium: 139 mEq/L (ref 136–145)
TOTAL PROTEIN: 7.5 g/dL (ref 6.4–8.3)

## 2016-10-18 MED ORDER — SODIUM CHLORIDE 0.9 % IV SOLN
Freq: Once | INTRAVENOUS | Status: AC
  Administered 2016-10-18: 13:00:00 via INTRAVENOUS

## 2016-10-18 MED ORDER — SODIUM CHLORIDE 0.9 % IV SOLN
240.0000 mg | Freq: Once | INTRAVENOUS | Status: AC
  Administered 2016-10-18: 240 mg via INTRAVENOUS
  Filled 2016-10-18: qty 24

## 2016-10-18 NOTE — Telephone Encounter (Signed)
Gave patient AVS and calender per 5/25 LOS.  

## 2016-10-18 NOTE — Progress Notes (Signed)
Hematology and Oncology Follow Up Visit  Gregory [name] [id number] 06/03/1960 56 y.o. 10/18/2016 12:13 PM [name], Gregory Him, [name], Gregory Him, MD   Principle Diagnosis: 56 year old gentleman with renal cell cancer diagnosed in August 2017. He was found to have pulmonary nodules and currently has metastatic disease.   Prior Therapy: He is status post radical nephrectomy done on 02/07/2016 at [facility]. 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 The measurements of the tumor is 9.9 x 9.0 x 9.0 cm. He did have small pulmonary nodules and a repeat scan on 03/25/2016 showed a 0.6 mm left lower lobe nodule previously measuring 3 mm. There is a 1 cm left lower lobe nodule previously was 0.5.  Current therapy:  Nivolumab 240 mg every 2 weeks started on 06/07/2016. This is his first-line therapy chosen because of cardiomyopathy.  He is here for cycle 10 of therapy.  Interim History: Gregory [name] presents today for a follow-up visit. Since the last visit, he reports doing well without any complaints. He continues to receive Nivolumab without any complications. He denied any nausea, abdominal pain or dyspepsia. He denied any pulmonary symptoms including cough, dyspnea and exertion or difficulty breathing. He does report some dry skin periodically but no pruritus or rash. His performance status and activity level has not dramatically changed. He denied any hematuria or flank pain.  He does not report any headaches or blurry vision, syncope or seizures. He does not report any fevers, chills, sweats or weight loss. His appetite is excellent and have gained weight. He does not report any chest pain, palpitation, orthopnea or leg edema. He does not report any cough, wheezing or hemoptysis. He does not report any nausea, vomiting or abdominal pain. He does not report any frequency urgency or hesitancy. Remaining review of systems unremarkable.   Medications: I have  reviewed the patient's current medications.  Current Outpatient Prescriptions  Medication Sig Dispense Refill  . carvedilol (COREG) 3.125 MG tablet TAKE 1 TABLET TWICE DAILY. 60 tablet 6  . cetirizine (ZYRTEC) 10 MG tablet Take 10 mg by mouth daily.    . cyanocobalamin 500 MCG tablet Take 500 mcg by mouth daily.    . diphenoxylate-atropine (LOMOTIL) 2.5-0.025 MG tablet Take 2 tablets by mouth 4 (four) times daily as needed for diarrhea or loose stools. 30 tablet 0  . loperamide (IMODIUM) 2 MG capsule Take 2 mg by mouth as needed for diarrhea or loose stools.    Marland Kitchen PARoxetine (PAXIL-CR) 25 MG 24 hr tablet Take 25 mg by mouth daily.    . sacubitril-valsartan (ENTRESTO) 49-51 MG Take 1 tablet by mouth 2 (two) times daily. 60 tablet 3   No current facility-administered medications for this visit.      Allergies: No Known Allergies  Past Medical History, Surgical history, Social history, and Family History were reviewed and updated.   Physical Exam: Blood pressure 125/79, pulse 65, temperature 97.7 F (36.5 C), temperature source Oral, resp. rate 18, height 6\' 2"  (1.88 m), weight 252 lb 1.6 oz (114.4 kg), SpO2 99 %. ECOG: 0 General appearance: Well-appearing gentleman without distress. Head: Normocephalic, without obvious abnormality no oral ulcers or lesions. Neck: no adenopathy Lymph nodes: Cervical, supraclavicular, and axillary nodes normal. Heart:regular rate and rhythm, S1, S2 normal, no murmur, click, rub or gallop Lung:chest clear, no wheezing, rales, normal symmetric air entry Abdomin: soft, non-tender, without masses or organomegaly no rebound or guarding. EXT:no erythema, induration, or nodules   Lab  Results: Lab Results  Component Value Date   WBC 5.4 10/18/2016   HGB 15.4 10/18/2016   HCT 45.1 10/18/2016   MCV 88.4 10/18/2016   PLT 192 10/18/2016     Chemistry      Component Value Date/Time   NA 138 10/04/2016 1053   K 4.7 10/04/2016 1053   CL 105 09/23/2016  0830   CO2 25 10/04/2016 1053   BUN 23.5 10/04/2016 1053   CREATININE 1.5 (H) 10/04/2016 1053      Component Value Date/Time   CALCIUM 9.8 10/04/2016 1053   ALKPHOS 55 10/04/2016 1053   AST 23 10/04/2016 1053   ALT 21 10/04/2016 1053   BILITOT 0.75 10/04/2016 1053     CT scan of the chest abdomen and pelvis obtained on 07/03/2016 and [facility] was personally reviewed today. The scan did show left lower lobe pulmonary nodule that have slightly increased in size. He has a nodule measuring 15 mm and another one measuring 9.5 mm. There are additional pulmonary nodules on the long not visible on 03/25/2012. There is a right middle lobe nodule measuring 5 mm. There is no other areas of metastasis noted.   Impression and Plan: 56 year old with the following issues:  1. 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 He is status post radical nephrectomy done on 02/07/2016 at [facility]. 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 The measurements of the tumor is 9.9 x 9.0 x 9.0 cm. He did have small pulmonary nodules   He is currently receiving Nivolumab 240 mg every 2 weeks started on 06/07/2016.   His most recent CT scan obtained in April 2018 at [facility] indicate stable disease.   Risks and benefits of continuing therapy were reviewed today he is agreeable to continue. He will have repeat imaging studies in July 2018 when he travels to [facility].  2. Diarrhea: Bowel habits have normalized at this time.  3. Cardiomyopathy: Continues to follow with cardiology and his cardiac medications are optimized. His EF continues to be close to 20%.  4. Thyroid function surveillance: His TSH will be repeated periodically.  5. Follow-up: Will be in 2 weeks for the next cycle of therapy.   Zola Button, MD 5/25/201812:13 PM

## 2016-10-18 NOTE — Patient Instructions (Signed)
[facility] Discharge Instructions for Patients Receiving Chemotherapy  Today you received the following chemotherapy agents:  Opdivo (nivolumab)  To help prevent nausea and vomiting after your treatment, we encourage you to take your nausea medication as prescribed.   If you develop nausea and vomiting that is not controlled by your nausea medication, call the clinic.   BELOW ARE SYMPTOMS THAT SHOULD BE REPORTED IMMEDIATELY:  *FEVER GREATER THAN 100.5 F  *CHILLS WITH OR WITHOUT FEVER  NAUSEA AND VOMITING THAT IS NOT CONTROLLED WITH YOUR NAUSEA MEDICATION  *UNUSUAL SHORTNESS OF BREATH  *UNUSUAL BRUISING OR BLEEDING  TENDERNESS IN MOUTH AND THROAT WITH OR WITHOUT PRESENCE OF ULCERS  *URINARY PROBLEMS  *BOWEL PROBLEMS  UNUSUAL RASH Items with * indicate a potential emergency and should be followed up as soon as possible.  Feel free to call the clinic you have any questions or concerns. The clinic phone number is [phone].  Please show the CHEMO ALERT CARD at check-in to the Emergency Department and triage nurse.   

## 2016-10-18 NOTE — Progress Notes (Signed)
Per MD Elayne Snare to treat with Creatinine 1.6.

## 2016-10-29 ENCOUNTER — Encounter: Payer: Self-pay | Admitting: Oncology

## 2016-10-30 ENCOUNTER — Telehealth: Payer: Self-pay | Admitting: *Deleted

## 2016-10-30 ENCOUNTER — Encounter: Payer: Self-pay | Admitting: *Deleted

## 2016-10-30 NOTE — Telephone Encounter (Signed)
Patient calling to say he will be out of town on 11/01/16 and will need to cancel his chemotherapy appt. Okay per dr Alen Blew, to keep regularly scheduled appt for lab, dr, chemo on 11/15/16. Patient notified and 11/01/16 appt cancelled.

## 2016-11-01 ENCOUNTER — Ambulatory Visit: Payer: BLUE CROSS/BLUE SHIELD

## 2016-11-01 ENCOUNTER — Other Ambulatory Visit: Payer: BLUE CROSS/BLUE SHIELD

## 2016-11-15 ENCOUNTER — Telehealth: Payer: Self-pay | Admitting: Oncology

## 2016-11-15 ENCOUNTER — Ambulatory Visit (HOSPITAL_BASED_OUTPATIENT_CLINIC_OR_DEPARTMENT_OTHER): Payer: BLUE CROSS/BLUE SHIELD

## 2016-11-15 ENCOUNTER — Other Ambulatory Visit (HOSPITAL_BASED_OUTPATIENT_CLINIC_OR_DEPARTMENT_OTHER): Payer: BLUE CROSS/BLUE SHIELD

## 2016-11-15 ENCOUNTER — Ambulatory Visit (HOSPITAL_BASED_OUTPATIENT_CLINIC_OR_DEPARTMENT_OTHER): Payer: BLUE CROSS/BLUE SHIELD | Admitting: Oncology

## 2016-11-15 VITALS — BP 128/74 | HR 61 | Temp 97.9°F | Resp 18 | Ht 74.0 in | Wt 252.0 lb

## 2016-11-15 DIAGNOSIS — I429 Cardiomyopathy, unspecified: Secondary | ICD-10-CM | POA: Diagnosis not present

## 2016-11-15 DIAGNOSIS — C649 Malignant neoplasm of unspecified kidney, except renal pelvis: Secondary | ICD-10-CM

## 2016-11-15 DIAGNOSIS — Z5112 Encounter for antineoplastic immunotherapy: Secondary | ICD-10-CM | POA: Diagnosis not present

## 2016-11-15 DIAGNOSIS — C7800 Secondary malignant neoplasm of unspecified lung: Secondary | ICD-10-CM | POA: Diagnosis not present

## 2016-11-15 LAB — COMPREHENSIVE METABOLIC PANEL
ALT: 17 U/L (ref 0–55)
ANION GAP: 9 meq/L (ref 3–11)
AST: 22 U/L (ref 5–34)
Albumin: 4.1 g/dL (ref 3.5–5.0)
Alkaline Phosphatase: 49 U/L (ref 40–150)
BILIRUBIN TOTAL: 0.88 mg/dL (ref 0.20–1.20)
BUN: 16.6 mg/dL (ref 7.0–26.0)
CHLORIDE: 106 meq/L (ref 98–109)
CO2: 27 meq/L (ref 22–29)
Calcium: 10 mg/dL (ref 8.4–10.4)
Creatinine: 1.5 mg/dL — ABNORMAL HIGH (ref 0.7–1.3)
EGFR: 50 mL/min/{1.73_m2} — AB (ref >90)
GLUCOSE: 89 mg/dL (ref 70–140)
Potassium: 4.1 mEq/L (ref 3.5–5.1)
SODIUM: 142 meq/L (ref 136–145)
TOTAL PROTEIN: 7.3 g/dL (ref 6.4–8.3)

## 2016-11-15 LAB — CBC WITH DIFFERENTIAL/PLATELET
BASO%: 0.6 % (ref 0.0–2.0)
Basophils Absolute: 0 10*3/uL (ref 0.0–0.1)
EOS%: 3.8 % (ref 0.0–7.0)
Eosinophils Absolute: 0.2 10*3/uL (ref 0.0–0.5)
HCT: 42.1 % (ref 38.4–49.9)
HGB: 14.1 g/dL (ref 13.0–17.1)
LYMPH%: 25 % (ref 14.0–49.0)
MCH: 30.5 pg (ref 27.2–33.4)
MCHC: 33.5 g/dL (ref 32.0–36.0)
MCV: 91.1 fL (ref 79.3–98.0)
MONO#: 0.3 10*3/uL (ref 0.1–0.9)
MONO%: 6.4 % (ref 0.0–14.0)
NEUT%: 64.2 % (ref 39.0–75.0)
NEUTROS ABS: 3.4 10*3/uL (ref 1.5–6.5)
PLATELETS: 180 10*3/uL (ref 140–400)
RBC: 4.62 10*6/uL (ref 4.20–5.82)
RDW: 14.9 % — ABNORMAL HIGH (ref 11.0–14.6)
WBC: 5.3 10*3/uL (ref 4.0–10.3)
lymph#: 1.3 10*3/uL (ref 0.9–3.3)

## 2016-11-15 MED ORDER — SODIUM CHLORIDE 0.9 % IV SOLN
240.0000 mg | Freq: Once | INTRAVENOUS | Status: AC
  Administered 2016-11-15: 240 mg via INTRAVENOUS
  Filled 2016-11-15: qty 24

## 2016-11-15 MED ORDER — PAROXETINE HCL ER 25 MG PO TB24: 25.0000 mg | ORAL_TABLET | Freq: Every day | ORAL | 3 refills | Status: DC

## 2016-11-15 MED ORDER — SODIUM CHLORIDE 0.9 % IV SOLN
Freq: Once | INTRAVENOUS | Status: AC
  Administered 2016-11-15: 13:00:00 via INTRAVENOUS

## 2016-11-15 NOTE — Patient Instructions (Signed)
[facility] Discharge Instructions for Patients Receiving Chemotherapy  Today you received the following chemotherapy agents Nivolumab  To help prevent nausea and vomiting after your treatment, we encourage you to take your nausea medication     If you develop nausea and vomiting that is not controlled by your nausea medication, call the clinic.   BELOW ARE SYMPTOMS THAT SHOULD BE REPORTED IMMEDIATELY:  *FEVER GREATER THAN 100.5 F  *CHILLS WITH OR WITHOUT FEVER  NAUSEA AND VOMITING THAT IS NOT CONTROLLED WITH YOUR NAUSEA MEDICATION  *UNUSUAL SHORTNESS OF BREATH  *UNUSUAL BRUISING OR BLEEDING  TENDERNESS IN MOUTH AND THROAT WITH OR WITHOUT PRESENCE OF ULCERS  *URINARY PROBLEMS  *BOWEL PROBLEMS  UNUSUAL RASH Items with * indicate a potential emergency and should be followed up as soon as possible.  Feel free to call the clinic you have any questions or concerns. The clinic phone number is [phone].  Please show the CHEMO ALERT CARD at check-in to the Emergency Department and triage nurse.   

## 2016-11-15 NOTE — Progress Notes (Signed)
Hematology and Oncology Follow Up Visit  Gregory [name] [id number] 13-Dec-1960 56 y.o. 11/15/2016 12:40 PM Tisovec, Gregory [name], [name], Gregory Him, MD   Principle Diagnosis: 56 year old gentleman with renal cell cancer diagnosed in August 2017. He was found to have pulmonary nodules and currently has metastatic disease.   Prior Therapy: He is status post radical nephrectomy done on 02/07/2016 at [facility]. 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 The measurements of the tumor is 9.9 x 9.0 x 9.0 cm. He did have small pulmonary nodules and a repeat scan on 03/25/2016 showed a 0.6 mm left lower lobe nodule previously measuring 3 mm. There is a 1 cm left lower lobe nodule previously was 0.5.  Current therapy:  Nivolumab 240 mg every 2 weeks started on 06/07/2016. This is his first-line therapy chosen because of cardiomyopathy.  He is here for cycle 12 of therapy.  Interim History: [name] presents today for a follow-up visit. Since the last visit, he reports no changes in his health. He continues to receive Nivolumab without any complications. He denied any skin rashes or pruritus. He denied any nausea, abdominal pain or dyspepsia. He denied any pulmonary symptoms including cough, dyspnea and exertion or difficulty breathing. He remains active and attends to activities of daily living. He is exercising more regularly now and try to monitor his diet.  He does not report any headaches or blurry vision, syncope or seizures. He does not report any fevers, chills, sweats or weight loss. His appetite is excellent and have gained weight. He does not report any chest pain, palpitation, orthopnea or leg edema. He does not report any cough, wheezing or hemoptysis. He does not report any nausea, vomiting or abdominal pain. He does not report any frequency urgency or hesitancy. Remaining review of systems unremarkable.   Medications: I have reviewed the  patient's current medications.  Current Outpatient Prescriptions  Medication Sig Dispense Refill  . carvedilol (COREG) 3.125 MG tablet TAKE 1 TABLET TWICE DAILY. 60 tablet 6  . cetirizine (ZYRTEC) 10 MG tablet Take 10 mg by mouth daily.    . cyanocobalamin 500 MCG tablet Take 500 mcg by mouth daily.    . diphenoxylate-atropine (LOMOTIL) 2.5-0.025 MG tablet Take 2 tablets by mouth 4 (four) times daily as needed for diarrhea or loose stools. 30 tablet 0  . loperamide (IMODIUM) 2 MG capsule Take 2 mg by mouth as needed for diarrhea or loose stools.    Marland Kitchen losartan (COZAAR) 50 MG tablet   2  . PARoxetine (PAXIL-CR) 25 MG 24 hr tablet Take 1 tablet (25 mg total) by mouth daily. 90 tablet 3  . sacubitril-valsartan (ENTRESTO) 49-51 MG Take 1 tablet by mouth 2 (two) times daily. 60 tablet 3   No current facility-administered medications for this visit.      Allergies: No Known Allergies  Past Medical History, Surgical history, Social history, and Family History were reviewed and updated.   Physical Exam: Blood pressure 128/74, pulse 61, temperature 97.9 F (36.6 C), temperature source Oral, resp. rate 18, height 6\' 2"  (1.88 m), weight 252 lb (114.3 kg), SpO2 98 %. ECOG: 0 General appearance: Alert, awake gentleman without distress. Head: Normocephalic, without obvious abnormality no oral thrush or ulcers. Neck: no adenopathy Lymph nodes: Cervical, supraclavicular, and axillary nodes normal. Heart:regular rate and rhythm, S1, S2 normal, no murmur, click, rub or gallop Lung:chest clear, no wheezing, rales, normal symmetric air entry Abdomin: soft, non-tender, without masses or  organomegaly no shifting dullness or ascites. EXT:no erythema, induration, or nodules   Lab Results: Lab Results  Component Value Date   WBC 5.3 11/15/2016   HGB 14.1 11/15/2016   HCT 42.1 11/15/2016   MCV 91.1 11/15/2016   PLT 180 11/15/2016     Chemistry      Component Value Date/Time   NA 139 10/18/2016  1144   K 4.1 10/18/2016 1144   CL 105 09/23/2016 0830   CO2 27 10/18/2016 1144   BUN 26.2 (H) 10/18/2016 1144   CREATININE 1.6 (H) 10/18/2016 1144      Component Value Date/Time   CALCIUM 9.9 10/18/2016 1144   ALKPHOS 56 10/18/2016 1144   AST 21 10/18/2016 1144   ALT 19 10/18/2016 1144   BILITOT 0.81 10/18/2016 1144     CT scan of the chest abdomen and pelvis obtained on 07/03/2016 and [facility] was personally reviewed today. The scan did show left lower lobe pulmonary nodule that have slightly increased in size. He has a nodule measuring 15 mm and another one measuring 9.5 mm. There are additional pulmonary nodules on the long not visible on 03/25/2012. There is a right middle lobe nodule measuring 5 mm. There is no other areas of metastasis noted.   Impression and Plan: 56 year old with the following issues:  1. 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 He is status post radical nephrectomy done on 02/07/2016 at [facility] [place]. 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 The measurements of the tumor is 9.9 x 9.0 x 9.0 cm. He did have small pulmonary nodules   He is currently receiving Nivolumab 240 mg every 2 weeks started on 06/07/2016.   CT scan obtained in April 2018 at [facility] indicate stable disease.   The plan is to continue on the current treatment with the same dose and schedule given his excellent tolerance. He will have repeat imaging studies done at [facility] in first week of July.   2. Diarrhea: He is no longer reporting any diarrhea at this time.  3. Cardiomyopathy: Continues to follow with cardiology and his cardiac medications are optimized. His EF continues to be close to 20%.  4. Thyroid function surveillance: His TSH will be repeated periodically.  5. Follow-up: Will be in 2 weeks for the next cycle of therapy.   Zola Button, MD 6/22/201812:40 PM

## 2016-11-15 NOTE — Progress Notes (Signed)
1344: pt reports burning and stinging at IV site. Swelling noted at site approx 4cm by 3.5cm. No redness noted. Nivolumab stopped, Dr. Alen Blew aware, remove PIV, start new one and resume Nivolumab. PIV removed, new one started and Nivolumab resumed.  1450: no swelling, redness or pain noted at right PIV site. Pt aware of new appts (11/16/16 at 1300, 11/18/16 at 0730 and 11/22/16 at 0730) scheduling message sent. Pt verbalizes understanding. Pt stable at discharge.

## 2016-11-15 NOTE — Telephone Encounter (Signed)
Scheduled appt per 6/22 los - Gave patient AVS and calender per los.  

## 2016-11-16 ENCOUNTER — Ambulatory Visit: Payer: BLUE CROSS/BLUE SHIELD

## 2016-11-16 NOTE — Progress Notes (Signed)
Patient here today for re-assessment of right forearm PIV extravasation. Patient presents today with complete resolution of symptoms to site. There are no signs of infection present. No edema or redness present. Patient confirms no pain, discomfort or itching to site. Patient thanked for coming in today. Patient encouraged to call clinic should he notice any new issues to site.

## 2016-11-29 ENCOUNTER — Other Ambulatory Visit (HOSPITAL_BASED_OUTPATIENT_CLINIC_OR_DEPARTMENT_OTHER): Payer: BLUE CROSS/BLUE SHIELD

## 2016-11-29 ENCOUNTER — Ambulatory Visit (HOSPITAL_BASED_OUTPATIENT_CLINIC_OR_DEPARTMENT_OTHER): Payer: BLUE CROSS/BLUE SHIELD

## 2016-11-29 VITALS — BP 145/84 | HR 68 | Temp 98.6°F | Resp 18

## 2016-11-29 DIAGNOSIS — C649 Malignant neoplasm of unspecified kidney, except renal pelvis: Secondary | ICD-10-CM

## 2016-11-29 DIAGNOSIS — Z5112 Encounter for antineoplastic immunotherapy: Secondary | ICD-10-CM | POA: Diagnosis not present

## 2016-11-29 DIAGNOSIS — Z79899 Other long term (current) drug therapy: Secondary | ICD-10-CM | POA: Diagnosis not present

## 2016-11-29 DIAGNOSIS — C7800 Secondary malignant neoplasm of unspecified lung: Secondary | ICD-10-CM | POA: Diagnosis not present

## 2016-11-29 LAB — CBC WITH DIFFERENTIAL/PLATELET
BASO%: 0.2 % (ref 0.0–2.0)
Basophils Absolute: 0 10*3/uL (ref 0.0–0.1)
EOS%: 3.4 % (ref 0.0–7.0)
Eosinophils Absolute: 0.2 10*3/uL (ref 0.0–0.5)
HCT: 42 % (ref 38.4–49.9)
HGB: 14.6 g/dL (ref 13.0–17.1)
LYMPH%: 19.6 % (ref 14.0–49.0)
MCH: 31.7 pg (ref 27.2–33.4)
MCHC: 34.8 g/dL (ref 32.0–36.0)
MCV: 91.3 fL (ref 79.3–98.0)
MONO#: 0.5 10*3/uL (ref 0.1–0.9)
MONO%: 8.7 % (ref 0.0–14.0)
NEUT#: 4 10*3/uL (ref 1.5–6.5)
NEUT%: 68.1 % (ref 39.0–75.0)
PLATELETS: 188 10*3/uL (ref 140–400)
RBC: 4.6 10*6/uL (ref 4.20–5.82)
RDW: 15.5 % — ABNORMAL HIGH (ref 11.0–14.6)
WBC: 5.9 10*3/uL (ref 4.0–10.3)
lymph#: 1.2 10*3/uL (ref 0.9–3.3)

## 2016-11-29 LAB — COMPREHENSIVE METABOLIC PANEL
ALT: 16 U/L (ref 0–55)
ANION GAP: 9 meq/L (ref 3–11)
AST: 24 U/L (ref 5–34)
Albumin: 4 g/dL (ref 3.5–5.0)
Alkaline Phosphatase: 54 U/L (ref 40–150)
BUN: 19.3 mg/dL (ref 7.0–26.0)
CHLORIDE: 105 meq/L (ref 98–109)
CO2: 22 meq/L (ref 22–29)
CREATININE: 1.4 mg/dL — AB (ref 0.7–1.3)
Calcium: 9.8 mg/dL (ref 8.4–10.4)
EGFR: 57 mL/min/{1.73_m2} — AB (ref >90)
Glucose: 95 mg/dl (ref 70–140)
Potassium: 4.9 mEq/L (ref 3.5–5.1)
Sodium: 136 mEq/L (ref 136–145)
Total Bilirubin: 0.67 mg/dL (ref 0.20–1.20)
Total Protein: 7.6 g/dL (ref 6.4–8.3)

## 2016-11-29 LAB — TSH: TSH: 3.479 m(IU)/L (ref 0.320–4.118)

## 2016-11-29 MED ORDER — SODIUM CHLORIDE 0.9 % IV SOLN
Freq: Once | INTRAVENOUS | Status: AC
  Administered 2016-11-29: 13:00:00 via INTRAVENOUS

## 2016-11-29 MED ORDER — SODIUM CHLORIDE 0.9 % IV SOLN
240.0000 mg | Freq: Once | INTRAVENOUS | Status: AC
  Administered 2016-11-29: 240 mg via INTRAVENOUS
  Filled 2016-11-29: qty 24

## 2016-11-29 NOTE — Patient Instructions (Signed)
[facility] Discharge Instructions for Patients Receiving Chemotherapy  Today you received the following chemotherapy agents Nivolumab  To help prevent nausea and vomiting after your treatment, we encourage you to take your nausea medication     If you develop nausea and vomiting that is not controlled by your nausea medication, call the clinic.   BELOW ARE SYMPTOMS THAT SHOULD BE REPORTED IMMEDIATELY:  *FEVER GREATER THAN 100.5 F  *CHILLS WITH OR WITHOUT FEVER  NAUSEA AND VOMITING THAT IS NOT CONTROLLED WITH YOUR NAUSEA MEDICATION  *UNUSUAL SHORTNESS OF BREATH  *UNUSUAL BRUISING OR BLEEDING  TENDERNESS IN MOUTH AND THROAT WITH OR WITHOUT PRESENCE OF ULCERS  *URINARY PROBLEMS  *BOWEL PROBLEMS  UNUSUAL RASH Items with * indicate a potential emergency and should be followed up as soon as possible.  Feel free to call the clinic you have any questions or concerns. The clinic phone number is [phone].  Please show the CHEMO ALERT CARD at check-in to the Emergency Department and triage nurse.   

## 2016-12-02 DIAGNOSIS — C641 Malignant neoplasm of right kidney, except renal pelvis: Secondary | ICD-10-CM | POA: Diagnosis not present

## 2016-12-02 DIAGNOSIS — C7802 Secondary malignant neoplasm of left lung: Secondary | ICD-10-CM | POA: Diagnosis not present

## 2016-12-02 DIAGNOSIS — R911 Solitary pulmonary nodule: Secondary | ICD-10-CM | POA: Diagnosis not present

## 2016-12-02 DIAGNOSIS — C649 Malignant neoplasm of unspecified kidney, except renal pelvis: Secondary | ICD-10-CM | POA: Diagnosis not present

## 2016-12-02 DIAGNOSIS — Z79899 Other long term (current) drug therapy: Secondary | ICD-10-CM | POA: Diagnosis not present

## 2016-12-03 DIAGNOSIS — C649 Malignant neoplasm of unspecified kidney, except renal pelvis: Secondary | ICD-10-CM | POA: Diagnosis not present

## 2016-12-03 DIAGNOSIS — C7802 Secondary malignant neoplasm of left lung: Secondary | ICD-10-CM | POA: Diagnosis not present

## 2016-12-03 DIAGNOSIS — C641 Malignant neoplasm of right kidney, except renal pelvis: Secondary | ICD-10-CM | POA: Diagnosis not present

## 2016-12-13 ENCOUNTER — Ambulatory Visit (HOSPITAL_BASED_OUTPATIENT_CLINIC_OR_DEPARTMENT_OTHER): Payer: BLUE CROSS/BLUE SHIELD

## 2016-12-13 ENCOUNTER — Encounter: Payer: Self-pay | Admitting: Nurse Practitioner

## 2016-12-13 ENCOUNTER — Other Ambulatory Visit (HOSPITAL_BASED_OUTPATIENT_CLINIC_OR_DEPARTMENT_OTHER): Payer: BLUE CROSS/BLUE SHIELD

## 2016-12-13 VITALS — BP 129/86 | HR 63 | Temp 98.7°F | Resp 18

## 2016-12-13 DIAGNOSIS — C649 Malignant neoplasm of unspecified kidney, except renal pelvis: Secondary | ICD-10-CM | POA: Diagnosis not present

## 2016-12-13 DIAGNOSIS — Z5112 Encounter for antineoplastic immunotherapy: Secondary | ICD-10-CM

## 2016-12-13 DIAGNOSIS — C7800 Secondary malignant neoplasm of unspecified lung: Secondary | ICD-10-CM | POA: Diagnosis not present

## 2016-12-13 LAB — CBC WITH DIFFERENTIAL/PLATELET
BASO%: 0.4 % (ref 0.0–2.0)
BASOS ABS: 0 10*3/uL (ref 0.0–0.1)
EOS%: 3.6 % (ref 0.0–7.0)
Eosinophils Absolute: 0.2 10*3/uL (ref 0.0–0.5)
HCT: 40.1 % (ref 38.4–49.9)
HGB: 13.9 g/dL (ref 13.0–17.1)
LYMPH%: 23.1 % (ref 14.0–49.0)
MCH: 32 pg (ref 27.2–33.4)
MCHC: 34.7 g/dL (ref 32.0–36.0)
MCV: 92.4 fL (ref 79.3–98.0)
MONO#: 0.5 10*3/uL (ref 0.1–0.9)
MONO%: 10.2 % (ref 0.0–14.0)
NEUT#: 3.3 10*3/uL (ref 1.5–6.5)
NEUT%: 62.7 % (ref 39.0–75.0)
Platelets: 201 10*3/uL (ref 140–400)
RBC: 4.34 10*6/uL (ref 4.20–5.82)
RDW: 15.8 % — AB (ref 11.0–14.6)
WBC: 5.3 10*3/uL (ref 4.0–10.3)
lymph#: 1.2 10*3/uL (ref 0.9–3.3)

## 2016-12-13 LAB — COMPREHENSIVE METABOLIC PANEL
ALT: 13 U/L (ref 0–55)
AST: 15 U/L (ref 5–34)
Albumin: 4 g/dL (ref 3.5–5.0)
Alkaline Phosphatase: 53 U/L (ref 40–150)
Anion Gap: 7 mEq/L (ref 3–11)
BUN: 25.2 mg/dL (ref 7.0–26.0)
CHLORIDE: 109 meq/L (ref 98–109)
CO2: 23 mEq/L (ref 22–29)
Calcium: 9.4 mg/dL (ref 8.4–10.4)
Creatinine: 1.4 mg/dL — ABNORMAL HIGH (ref 0.7–1.3)
EGFR: 57 mL/min/{1.73_m2} — ABNORMAL LOW (ref >90)
Glucose: 104 mg/dl (ref 70–140)
POTASSIUM: 4.5 meq/L (ref 3.5–5.1)
SODIUM: 139 meq/L (ref 136–145)
Total Bilirubin: 0.58 mg/dL (ref 0.20–1.20)
Total Protein: 7.2 g/dL (ref 6.4–8.3)

## 2016-12-13 MED ORDER — NIVOLUMAB CHEMO INJECTION 100 MG/10ML
240.0000 mg | Freq: Once | INTRAVENOUS | Status: AC
  Administered 2016-12-13: 240 mg via INTRAVENOUS
  Filled 2016-12-13: qty 24

## 2016-12-13 MED ORDER — SODIUM CHLORIDE 0.9% FLUSH
10.0000 mL | INTRAVENOUS | Status: DC | PRN
  Filled 2016-12-13: qty 10

## 2016-12-13 MED ORDER — SODIUM CHLORIDE 0.9 % IV SOLN
Freq: Once | INTRAVENOUS | Status: AC
  Administered 2016-12-13: 12:00:00 via INTRAVENOUS

## 2016-12-13 MED ORDER — HEPARIN SOD (PORK) LOCK FLUSH 100 UNIT/ML IV SOLN
500.0000 [IU] | Freq: Once | INTRAVENOUS | Status: DC | PRN
  Filled 2016-12-13: qty 5

## 2016-12-13 NOTE — Patient Instructions (Signed)
[facility] Discharge Instructions for Patients Receiving Chemotherapy  Today you received the following chemotherapy agents:  Nivolumab.  To help prevent nausea and vomiting after your treatment, we encourage you to take your nausea medication as directed.   If you develop nausea and vomiting that is not controlled by your nausea medication, call the clinic.   BELOW ARE SYMPTOMS THAT SHOULD BE REPORTED IMMEDIATELY:  *FEVER GREATER THAN 100.5 F  *CHILLS WITH OR WITHOUT FEVER  NAUSEA AND VOMITING THAT IS NOT CONTROLLED WITH YOUR NAUSEA MEDICATION  *UNUSUAL SHORTNESS OF BREATH  *UNUSUAL BRUISING OR BLEEDING  TENDERNESS IN MOUTH AND THROAT WITH OR WITHOUT PRESENCE OF ULCERS  *URINARY PROBLEMS  *BOWEL PROBLEMS  UNUSUAL RASH Items with * indicate a potential emergency and should be followed up as soon as possible.  Feel free to call the clinic you have any questions or concerns. The clinic phone number is [phone].  Please show the CHEMO ALERT CARD at check-in to the Emergency Department and triage nurse.   

## 2016-12-23 ENCOUNTER — Telehealth: Payer: Self-pay | Admitting: Internal Medicine

## 2016-12-23 NOTE — Telephone Encounter (Signed)
Pt used to be a Gregory [name] and Gregory [name] Pt and left when Gregory [name] retired, he would like to come back to the practice he is unhappy with his current PCP because he did not diagnose his cancer.  He states he is over due for a CPE and particularly wants to come back here.  Please advise.

## 2016-12-23 NOTE — Telephone Encounter (Signed)
Yes, I will see him.

## 2016-12-27 ENCOUNTER — Other Ambulatory Visit (HOSPITAL_BASED_OUTPATIENT_CLINIC_OR_DEPARTMENT_OTHER): Payer: BLUE CROSS/BLUE SHIELD

## 2016-12-27 ENCOUNTER — Telehealth: Payer: Self-pay | Admitting: Oncology

## 2016-12-27 ENCOUNTER — Ambulatory Visit (HOSPITAL_BASED_OUTPATIENT_CLINIC_OR_DEPARTMENT_OTHER): Payer: BLUE CROSS/BLUE SHIELD

## 2016-12-27 ENCOUNTER — Ambulatory Visit (HOSPITAL_BASED_OUTPATIENT_CLINIC_OR_DEPARTMENT_OTHER): Payer: BLUE CROSS/BLUE SHIELD | Admitting: Oncology

## 2016-12-27 VITALS — BP 136/79 | HR 68 | Temp 99.0°F | Resp 20 | Ht 74.0 in | Wt 250.4 lb

## 2016-12-27 DIAGNOSIS — C7800 Secondary malignant neoplasm of unspecified lung: Secondary | ICD-10-CM | POA: Diagnosis not present

## 2016-12-27 DIAGNOSIS — C649 Malignant neoplasm of unspecified kidney, except renal pelvis: Secondary | ICD-10-CM

## 2016-12-27 DIAGNOSIS — Z5112 Encounter for antineoplastic immunotherapy: Secondary | ICD-10-CM | POA: Diagnosis not present

## 2016-12-27 DIAGNOSIS — I429 Cardiomyopathy, unspecified: Secondary | ICD-10-CM

## 2016-12-27 LAB — COMPREHENSIVE METABOLIC PANEL
ALBUMIN: 3.9 g/dL (ref 3.5–5.0)
ALK PHOS: 56 U/L (ref 40–150)
ALT: 14 U/L (ref 0–55)
AST: 18 U/L (ref 5–34)
Anion Gap: 8 mEq/L (ref 3–11)
BUN: 20 mg/dL (ref 7.0–26.0)
CHLORIDE: 105 meq/L (ref 98–109)
CO2: 26 mEq/L (ref 22–29)
Calcium: 9.8 mg/dL (ref 8.4–10.4)
Creatinine: 1.4 mg/dL — ABNORMAL HIGH (ref 0.7–1.3)
EGFR: 54 mL/min/{1.73_m2} — ABNORMAL LOW (ref >90)
GLUCOSE: 134 mg/dL (ref 70–140)
POTASSIUM: 4.3 meq/L (ref 3.5–5.1)
SODIUM: 139 meq/L (ref 136–145)
Total Bilirubin: 0.59 mg/dL (ref 0.20–1.20)
Total Protein: 7 g/dL (ref 6.4–8.3)

## 2016-12-27 LAB — CBC WITH DIFFERENTIAL/PLATELET
BASO%: 0.6 % (ref 0.0–2.0)
BASOS ABS: 0 10*3/uL (ref 0.0–0.1)
EOS%: 2.8 % (ref 0.0–7.0)
Eosinophils Absolute: 0.2 10*3/uL (ref 0.0–0.5)
HCT: 41.4 % (ref 38.4–49.9)
HEMOGLOBIN: 14.3 g/dL (ref 13.0–17.1)
LYMPH%: 17 % (ref 14.0–49.0)
MCH: 32.6 pg (ref 27.2–33.4)
MCHC: 34.4 g/dL (ref 32.0–36.0)
MCV: 94.8 fL (ref 79.3–98.0)
MONO#: 0.4 10*3/uL (ref 0.1–0.9)
MONO%: 7 % (ref 0.0–14.0)
NEUT#: 4.5 10*3/uL (ref 1.5–6.5)
NEUT%: 72.6 % (ref 39.0–75.0)
Platelets: 199 10*3/uL (ref 140–400)
RBC: 4.37 10*6/uL (ref 4.20–5.82)
RDW: 18 % — AB (ref 11.0–14.6)
WBC: 6.2 10*3/uL (ref 4.0–10.3)
lymph#: 1.1 10*3/uL (ref 0.9–3.3)

## 2016-12-27 MED ORDER — SODIUM CHLORIDE 0.9 % IV SOLN
240.0000 mg | Freq: Once | INTRAVENOUS | Status: AC
  Administered 2016-12-27: 240 mg via INTRAVENOUS
  Filled 2016-12-27: qty 24

## 2016-12-27 MED ORDER — SODIUM CHLORIDE 0.9 % IV SOLN
Freq: Once | INTRAVENOUS | Status: AC
  Administered 2016-12-27: 11:00:00 via INTRAVENOUS

## 2016-12-27 NOTE — Telephone Encounter (Signed)
Gave patient avs and calendars for Aug, Sept, and Oct.

## 2016-12-27 NOTE — Progress Notes (Signed)
Hematology and Oncology Follow Up Visit  Gregory [name] [id number] Oct 08, 1960 56 y.o. 12/27/2016 9:27 AM Tisovec, Gregory [name], [name], Gregory Him, MD   Principle Diagnosis: 56 year old gentleman with renal cell cancer diagnosed in August 2017. He was found to have pulmonary nodules and currently has metastatic disease.   Prior Therapy: He is status post radical nephrectomy done on 02/07/2016 at [facility] [place]. 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 The measurements of the tumor is 9.9 x 9.0 x 9.0 cm. He did have small pulmonary nodules and a repeat scan on 03/25/2016 showed a 0.6 mm left lower lobe nodule previously measuring 3 mm. There is a 1 cm left lower lobe nodule previously was 0.5.  Current therapy:  Nivolumab 240 mg every 2 weeks started on 06/07/2016. This is his first-line therapy chosen because of cardiomyopathy. He is here for the next cycle of therapy.  Interim History: Gregory [name] presents today for a follow-up visit. Since the last visit, he reports continuous improvement in his health. He continues to exercise regularly and have intentionally lost weight. His performance status and activity level remain excellent. He continues to receive Nivolumab without any complications. He denied any skin rashes or pruritus. He denied any nausea, abdominal pain or dyspepsia. He denied any pulmonary symptoms including cough, dyspnea and exertion or difficulty breathing. He was started on Synthroid for presumed hypothyroidism associated with this therapy.  He does not report any headaches or blurry vision, syncope or seizures. He does not report any fevers, chills, sweats or weight loss. His appetite is excellent and have gained weight. He does not report any chest pain, palpitation, orthopnea or leg edema. He does not report any cough, wheezing or hemoptysis. He does not report any nausea, vomiting or abdominal pain. He does not report any frequency  urgency or hesitancy. Remaining review of systems unremarkable.   Medications: I have reviewed the patient's current medications.  Current Outpatient Prescriptions  Medication Sig Dispense Refill  . carvedilol (COREG) 3.125 MG tablet TAKE 1 TABLET TWICE DAILY. 60 tablet 6  . cetirizine (ZYRTEC) 10 MG tablet Take 10 mg by mouth daily.    . cyanocobalamin 500 MCG tablet Take 500 mcg by mouth daily.    . diphenoxylate-atropine (LOMOTIL) 2.5-0.025 MG tablet Take 2 tablets by mouth 4 (four) times daily as needed for diarrhea or loose stools. 30 tablet 0  . loperamide (IMODIUM) 2 MG capsule Take 2 mg by mouth as needed for diarrhea or loose stools.    Marland Kitchen losartan (COZAAR) 50 MG tablet   2  . PARoxetine (PAXIL-CR) 25 MG 24 hr tablet Take 1 tablet (25 mg total) by mouth daily. 90 tablet 3  . sacubitril-valsartan (ENTRESTO) 49-51 MG Take 1 tablet by mouth 2 (two) times daily. 60 tablet 3   No current facility-administered medications for this visit.      Allergies: No Known Allergies  Past Medical History, Surgical history, Social history, and Family History were reviewed and updated.   Physical Exam: Blood pressure 136/79, pulse 68, temperature 99 F (37.2 C), temperature source Oral, resp. rate 20, height 6\' 2"  (1.88 m), weight 250 lb 6.4 oz (113.6 kg), SpO2 96 %. ECOG: 0 General appearance: Well-appearing gentleman without distress. Head: Normocephalic, without obvious abnormality no oral ulcers or lesions. Neck: no adenopathy Lymph nodes: Cervical, supraclavicular, and axillary nodes normal. Heart:regular rate and rhythm, S1, S2 normal, no murmur, click, rub or gallop Lung:chest clear, no wheezing, rales, normal  symmetric air entry Abdomin: soft, non-tender, without masses or organomegaly no rebound or guarding. EXT:no erythema, induration, or nodules   Lab Results: Lab Results  Component Value Date   WBC 6.2 12/27/2016   HGB 14.3 12/27/2016   HCT 41.4 12/27/2016   MCV 94.8  12/27/2016   PLT 199 12/27/2016     Chemistry      Component Value Date/Time   NA 139 12/13/2016 1158   K 4.5 12/13/2016 1158   CL 105 09/23/2016 0830   CO2 23 12/13/2016 1158   BUN 25.2 12/13/2016 1158   CREATININE 1.4 (H) 12/13/2016 1158      Component Value Date/Time   CALCIUM 9.4 12/13/2016 1158   ALKPHOS 53 12/13/2016 1158   AST 15 12/13/2016 1158   ALT 13 12/13/2016 1158   BILITOT 0.58 12/13/2016 1158     CT scan obtained on 12/02/2016 at [facility] : IMPRESSION: 1. Overall stable to minimal interval increase in size in the dominant pulmonary nodule in the left lower lobe. 2. Overall no significant change in the hypervascular  foci within the liver that can be followed closely.   Impression and Plan: 56 year old with the following issues:  1. 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 He is status post radical nephrectomy done on 02/07/2016 at [facility] [place]. 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 The measurements of the tumor is 9.9 x 9.0 x 9.0 cm. He did have small pulmonary nodules   He is currently receiving Nivolumab 240 mg every 2 weeks started on 06/07/2016.   CT scan obtained in April 2018 at [facility] indicate stable disease.   CT scan obtained on 12/02/2016 was reviewed and showed stable disease.  Plan is to continue with the same dose and schedule and is scheduled to have repeat imaging studies in 3 months. This will be done with his next visit at [facility].  2. Diarrhea: He is no longer reporting any diarrhea at this time.  3. Cardiomyopathy: Continues to follow with cardiology and his cardiac medications are optimized. His EF continues to be close to 20%.  4. Thyroid function surveillance: He was started on Synthroid during his visit at [facility]. His last TSH was normal.  5. Follow-up: Will be in 2 weeks for the next cycle of therapy.   [facility],  MD 8/3/20189:27 AM

## 2016-12-27 NOTE — Telephone Encounter (Signed)
Appt made

## 2016-12-27 NOTE — Patient Instructions (Signed)
[facility] Discharge Instructions for Patients Receiving Chemotherapy  Today you received the following chemotherapy agents:  Nivolumab (opdivo)  To help prevent nausea and vomiting after your treatment, we encourage you to take your nausea medication as prescribed.   If you develop nausea and vomiting that is not controlled by your nausea medication, call the clinic.   BELOW ARE SYMPTOMS THAT SHOULD BE REPORTED IMMEDIATELY:  *FEVER GREATER THAN 100.5 F  *CHILLS WITH OR WITHOUT FEVER  NAUSEA AND VOMITING THAT IS NOT CONTROLLED WITH YOUR NAUSEA MEDICATION  *UNUSUAL SHORTNESS OF BREATH  *UNUSUAL BRUISING OR BLEEDING  TENDERNESS IN MOUTH AND THROAT WITH OR WITHOUT PRESENCE OF ULCERS  *URINARY PROBLEMS  *BOWEL PROBLEMS  UNUSUAL RASH Items with * indicate a potential emergency and should be followed up as soon as possible.  Feel free to call the clinic you have any questions or concerns. The clinic phone number is [phone].  Please show the [place] at check-in to the Emergency Department and triage nurse.

## 2017-01-10 ENCOUNTER — Ambulatory Visit: Payer: BLUE CROSS/BLUE SHIELD

## 2017-01-10 ENCOUNTER — Other Ambulatory Visit: Payer: BLUE CROSS/BLUE SHIELD

## 2017-01-13 ENCOUNTER — Encounter: Payer: Self-pay | Admitting: Internal Medicine

## 2017-01-13 ENCOUNTER — Ambulatory Visit (INDEPENDENT_AMBULATORY_CARE_PROVIDER_SITE_OTHER): Payer: BLUE CROSS/BLUE SHIELD | Admitting: Internal Medicine

## 2017-01-13 ENCOUNTER — Ambulatory Visit (HOSPITAL_BASED_OUTPATIENT_CLINIC_OR_DEPARTMENT_OTHER): Payer: BLUE CROSS/BLUE SHIELD

## 2017-01-13 ENCOUNTER — Other Ambulatory Visit (HOSPITAL_BASED_OUTPATIENT_CLINIC_OR_DEPARTMENT_OTHER): Payer: BLUE CROSS/BLUE SHIELD

## 2017-01-13 VITALS — BP 124/84 | HR 76 | Temp 98.6°F | Ht 74.0 in | Wt 248.0 lb

## 2017-01-13 VITALS — BP 124/79 | HR 68 | Temp 98.7°F | Resp 18

## 2017-01-13 DIAGNOSIS — E781 Pure hyperglyceridemia: Secondary | ICD-10-CM

## 2017-01-13 DIAGNOSIS — E119 Type 2 diabetes mellitus without complications: Secondary | ICD-10-CM

## 2017-01-13 DIAGNOSIS — C7800 Secondary malignant neoplasm of unspecified lung: Secondary | ICD-10-CM

## 2017-01-13 DIAGNOSIS — R7303 Prediabetes: Secondary | ICD-10-CM | POA: Diagnosis not present

## 2017-01-13 DIAGNOSIS — I110 Hypertensive heart disease with heart failure: Secondary | ICD-10-CM | POA: Diagnosis not present

## 2017-01-13 DIAGNOSIS — Z5112 Encounter for antineoplastic immunotherapy: Secondary | ICD-10-CM

## 2017-01-13 DIAGNOSIS — Z23 Encounter for immunization: Secondary | ICD-10-CM | POA: Diagnosis not present

## 2017-01-13 DIAGNOSIS — C649 Malignant neoplasm of unspecified kidney, except renal pelvis: Secondary | ICD-10-CM

## 2017-01-13 DIAGNOSIS — Z794 Long term (current) use of insulin: Secondary | ICD-10-CM

## 2017-01-13 DIAGNOSIS — E1165 Type 2 diabetes mellitus with hyperglycemia: Secondary | ICD-10-CM | POA: Insufficient documentation

## 2017-01-13 LAB — POCT GLYCOSYLATED HEMOGLOBIN (HGB A1C): HEMOGLOBIN A1C: 5.8

## 2017-01-13 LAB — COMPREHENSIVE METABOLIC PANEL
ALK PHOS: 56 U/L (ref 40–150)
ALT: 12 U/L (ref 0–55)
ANION GAP: 7 meq/L (ref 3–11)
AST: 16 U/L (ref 5–34)
Albumin: 3.6 g/dL (ref 3.5–5.0)
BILIRUBIN TOTAL: 0.84 mg/dL (ref 0.20–1.20)
BUN: 22.1 mg/dL (ref 7.0–26.0)
CALCIUM: 9.6 mg/dL (ref 8.4–10.4)
CO2: 25 mEq/L (ref 22–29)
CREATININE: 1.5 mg/dL — AB (ref 0.7–1.3)
Chloride: 104 mEq/L (ref 98–109)
EGFR: 50 mL/min/{1.73_m2} — ABNORMAL LOW (ref >90)
Glucose: 284 mg/dl — ABNORMAL HIGH (ref 70–140)
Potassium: 4.5 mEq/L (ref 3.5–5.1)
Sodium: 136 mEq/L (ref 136–145)
TOTAL PROTEIN: 6.6 g/dL (ref 6.4–8.3)

## 2017-01-13 LAB — CBC WITH DIFFERENTIAL/PLATELET
BASO%: 0.3 % (ref 0.0–2.0)
Basophils Absolute: 0 10*3/uL (ref 0.0–0.1)
EOS%: 2.9 % (ref 0.0–7.0)
Eosinophils Absolute: 0.2 10*3/uL (ref 0.0–0.5)
HCT: 38.9 % (ref 38.4–49.9)
HGB: 13.5 g/dL (ref 13.0–17.1)
LYMPH#: 1.1 10*3/uL (ref 0.9–3.3)
LYMPH%: 18 % (ref 14.0–49.0)
MCH: 33.3 pg (ref 27.2–33.4)
MCHC: 34.7 g/dL (ref 32.0–36.0)
MCV: 96 fL (ref 79.3–98.0)
MONO#: 0.5 10*3/uL (ref 0.1–0.9)
MONO%: 7.6 % (ref 0.0–14.0)
NEUT#: 4.4 10*3/uL (ref 1.5–6.5)
NEUT%: 71.2 % (ref 39.0–75.0)
PLATELETS: 184 10*3/uL (ref 140–400)
RBC: 4.05 10*6/uL — ABNORMAL LOW (ref 4.20–5.82)
RDW: 15.8 % — ABNORMAL HIGH (ref 11.0–14.6)
WBC: 6.2 10*3/uL (ref 4.0–10.3)

## 2017-01-13 MED ORDER — SODIUM CHLORIDE 0.9 % IV SOLN
Freq: Once | INTRAVENOUS | Status: AC
  Administered 2017-01-13: 10:00:00 via INTRAVENOUS

## 2017-01-13 MED ORDER — SODIUM CHLORIDE 0.9 % IV SOLN
240.0000 mg | Freq: Once | INTRAVENOUS | Status: AC
  Administered 2017-01-13: 240 mg via INTRAVENOUS
  Filled 2017-01-13: qty 24

## 2017-01-13 NOTE — Progress Notes (Signed)
Subjective:  Patient ID: Gregory [name], male    DOB: 03-11-1961  Age: 56 y.o. MRN: [id number]  CC: No chief complaint on file.  NEW TO ME  HPI JAMAR WEATHERALL presents for establishing with a new PCP. He is being treated for RCC, s/p right nephrectomy. He has a few episodes of non-fasting hyperglycemia recently. He has been trying to lose wt with diet and exercise.   History Ulice has a past medical history of Allergy; Anxiety; History of gastrointestinal hemorrhage; Hypertension; Hypertriglyceridemia; Macular degeneration; Plantar warts; and PONV (postoperative nausea and vomiting).   He has a past surgical history that includes Tonsillectomy; Vasectomy; sebaceous cyst excision (1985); orif fx fibula; Colonoscopy (03/22/2005); Eye surgery (Right); Total shoulder arthroplasty (Left, 11/11/2014); Colostomy; and Cardiac catheterization (N/A, 04/19/2016).   His family history includes Bipolar disorder in his mother; Cancer (age of onset: 38) in his mother; Hypertension in his mother; Other in his father.He reports that he has never smoked. He has never used smokeless tobacco. He reports that he does not drink alcohol or use drugs.  Outpatient Medications Prior to Visit  Medication Sig Dispense Refill  . carvedilol (COREG) 3.125 MG tablet TAKE 1 TABLET TWICE DAILY. 60 tablet 6  . cetirizine (ZYRTEC) 10 MG tablet Take 10 mg by mouth daily.    Marland Kitchen losartan (COZAAR) 50 MG tablet   2  . PARoxetine (PAXIL-CR) 25 MG 24 hr tablet Take 1 tablet (25 mg total) by mouth daily. 90 tablet 3  . sacubitril-valsartan (ENTRESTO) 49-51 MG Take 1 tablet by mouth 2 (two) times daily. 60 tablet 3  . cyanocobalamin 500 MCG tablet Take 500 mcg by mouth daily.    . diphenoxylate-atropine (LOMOTIL) 2.5-0.025 MG tablet Take 2 tablets by mouth 4 (four) times daily as needed for diarrhea or loose stools. 30 tablet 0  . loperamide (IMODIUM) 2 MG capsule Take 2 mg by mouth as needed for diarrhea or loose stools.     No  facility-administered medications prior to visit.     ROS Review of Systems  Constitutional: Negative.  Negative for appetite change, diaphoresis, fatigue and unexpected weight change.  HENT: Negative.   Eyes: Negative.  Negative for visual disturbance.  Respiratory: Negative.  Negative for cough, chest tightness, shortness of breath and wheezing.   Cardiovascular: Negative for chest pain, palpitations and leg swelling.  Gastrointestinal: Negative for abdominal pain, diarrhea, nausea and vomiting.  Endocrine: Negative.  Negative for cold intolerance and heat intolerance.  Genitourinary: Negative.  Negative for dysuria, frequency and urgency.  Musculoskeletal: Negative.   Skin: Negative.   Allergic/Immunologic: Negative.   Neurological: Negative.  Negative for dizziness, weakness and light-headedness.  Hematological: Negative for adenopathy. Does not bruise/bleed easily.  Psychiatric/Behavioral: Negative.     Objective:  BP 124/84 (BP Location: Left Arm, Patient Position: Sitting, Cuff Size: Normal)   Pulse 76   Temp 98.6 F (37 C) (Oral)   Ht 6\' 2"  (1.88 m)   Wt 248 lb (112.5 kg)   SpO2 98%   BMI 31.84 kg/m   Physical Exam  Constitutional: He is oriented to person, place, and time. No distress.  HENT:  Mouth/Throat: Oropharynx is clear and moist. No oropharyngeal exudate.  Eyes: Conjunctivae are normal. Right eye exhibits no discharge. Left eye exhibits no discharge. No scleral icterus.  Neck: Normal range of motion. Neck supple. No JVD present. No thyromegaly present.  Cardiovascular: Normal rate, regular rhythm and intact distal pulses.  Exam reveals no gallop and no friction  rub.   No murmur heard. Pulmonary/Chest: Effort normal and breath sounds normal. No respiratory distress. He has no wheezes. He has no rales. He exhibits no tenderness.  Abdominal: Soft. Bowel sounds are normal. He exhibits no distension and no mass. There is no tenderness. There is no rebound and no  guarding.  Musculoskeletal: Normal range of motion. He exhibits no edema, tenderness or deformity.  Lymphadenopathy:    He has no cervical adenopathy.  Neurological: He is alert and oriented to person, place, and time.  Skin: Skin is warm and dry. No rash noted. He is not diaphoretic. No erythema. No pallor.  Vitals reviewed.    Lab Results  Component Value Date   WBC 6.2 01/13/2017   HGB 13.5 01/13/2017   HCT 38.9 01/13/2017   PLT 184 01/13/2017   GLUCOSE 284 (H) 01/13/2017   CHOL 197 08/20/2011   TRIG 280.0 (H) 08/20/2011   HDL 33.30 (L) 08/20/2011   LDLDIRECT 117.5 08/20/2011   ALT 12 01/13/2017   AST 16 01/13/2017   NA 136 01/13/2017   K 4.5 01/13/2017   CL 105 09/23/2016   CREATININE 1.5 (H) 01/13/2017   BUN 22.1 01/13/2017   CO2 25 01/13/2017   TSH 3.479 11/29/2016   PSA 1.17 08/20/2011   INR 0.99 04/19/2016   HGBA1C 5.8 01/13/2017     Assessment & Plan:   Diagnoses and all orders for this visit:  Malignant essential hypertension with congestive heart failure ([place])- he feels well and has a normal volume status on Entresto, will continue  Need for pneumococcal vaccination -     Pneumococcal conjugate vaccine 13-valent  Pure hyperglyceridemia -     POCT glycosylated hemoglobin (Hb A1C)  Prediabetes- his A1C is at 5.8%, he is mildly diabetic, he does not need a medication to treat this but he will cont to work on his lifestyle modifications   I have discontinued Mr. Colaizzi cyanocobalamin, loperamide, and diphenoxylate-atropine. I am also having him maintain his cetirizine, sacubitril-valsartan, carvedilol, losartan, PARoxetine, and levothyroxine.  Meds ordered this encounter  Medications  . levothyroxine (SYNTHROID, LEVOTHROID) 25 MCG tablet    Sig: Take by mouth.     Follow-up: Return in about 4 months (around 05/15/2017).  Scarlette Calico, MD

## 2017-01-13 NOTE — Patient Instructions (Signed)

## 2017-01-13 NOTE — Patient Instructions (Signed)
[facility] Discharge Instructions for Patients Receiving Chemotherapy  Today you received the following chemotherapy agents:  Nivolumab.  To help prevent nausea and vomiting after your treatment, we encourage you to take your nausea medication as directed.   If you develop nausea and vomiting that is not controlled by your nausea medication, call the clinic.   BELOW ARE SYMPTOMS THAT SHOULD BE REPORTED IMMEDIATELY:  *FEVER GREATER THAN 100.5 F  *CHILLS WITH OR WITHOUT FEVER  NAUSEA AND VOMITING THAT IS NOT CONTROLLED WITH YOUR NAUSEA MEDICATION  *UNUSUAL SHORTNESS OF BREATH  *UNUSUAL BRUISING OR BLEEDING  TENDERNESS IN MOUTH AND THROAT WITH OR WITHOUT PRESENCE OF ULCERS  *URINARY PROBLEMS  *BOWEL PROBLEMS  UNUSUAL RASH Items with * indicate a potential emergency and should be followed up as soon as possible.  Feel free to call the clinic you have any questions or concerns. The clinic phone number is [phone].  Please show the CHEMO ALERT CARD at check-in to the Emergency Department and triage nurse.   

## 2017-01-22 ENCOUNTER — Telehealth: Payer: Self-pay

## 2017-01-22 DIAGNOSIS — R300 Dysuria: Secondary | ICD-10-CM

## 2017-01-22 MED ORDER — CIPROFLOXACIN HCL 500 MG PO TABS: 500.0000 mg | ORAL_TABLET | Freq: Two times a day (BID) | ORAL | 0 refills | Status: DC

## 2017-01-22 NOTE — Telephone Encounter (Signed)
Pt is out of town, he has UTI symptoms, he has frequency, minimal urine, urgency, pain. Since Monday. It is consistant.   His eyes are a little bit unfocused. He has dry mouth and is drinking plenty of water.   S/w Dr Alvy Bimler and e-scribed rx for cipro 500 mg bid for 3 days. [place] at [phone]. Instructed to go to urgent care if symptoms worsen.  Pt will get UA and culture on 8/31 with his next lab/inf visit.

## 2017-01-23 ENCOUNTER — Inpatient Hospital Stay (HOSPITAL_COMMUNITY)
Admission: EM | Admit: 2017-01-23 | Discharge: 2017-01-26 | DRG: 638 | Disposition: A | Discharge status: Home / Self Care | Payer: BLUE CROSS/BLUE SHIELD | Attending: Internal Medicine | Admitting: Internal Medicine

## 2017-01-23 ENCOUNTER — Other Ambulatory Visit: Payer: Self-pay | Admitting: *Deleted

## 2017-01-23 ENCOUNTER — Ambulatory Visit (HOSPITAL_BASED_OUTPATIENT_CLINIC_OR_DEPARTMENT_OTHER): Payer: BLUE CROSS/BLUE SHIELD | Admitting: Medical

## 2017-01-23 ENCOUNTER — Encounter (HOSPITAL_COMMUNITY): Payer: Self-pay | Admitting: Emergency Medicine

## 2017-01-23 ENCOUNTER — Ambulatory Visit (HOSPITAL_BASED_OUTPATIENT_CLINIC_OR_DEPARTMENT_OTHER): Payer: BLUE CROSS/BLUE SHIELD

## 2017-01-23 ENCOUNTER — Telehealth: Payer: Self-pay

## 2017-01-23 VITALS — BP 154/81 | HR 103 | Temp 97.9°F | Resp 18 | Ht 74.0 in | Wt 228.9 lb

## 2017-01-23 DIAGNOSIS — E1310 Other specified diabetes mellitus with ketoacidosis without coma: Secondary | ICD-10-CM | POA: Diagnosis not present

## 2017-01-23 DIAGNOSIS — Z79899 Other long term (current) drug therapy: Secondary | ICD-10-CM | POA: Diagnosis not present

## 2017-01-23 DIAGNOSIS — C649 Malignant neoplasm of unspecified kidney, except renal pelvis: Secondary | ICD-10-CM

## 2017-01-23 DIAGNOSIS — R300 Dysuria: Secondary | ICD-10-CM

## 2017-01-23 DIAGNOSIS — F419 Anxiety disorder, unspecified: Secondary | ICD-10-CM | POA: Diagnosis present

## 2017-01-23 DIAGNOSIS — Z818 Family history of other mental and behavioral disorders: Secondary | ICD-10-CM

## 2017-01-23 DIAGNOSIS — I5022 Chronic systolic (congestive) heart failure: Secondary | ICD-10-CM | POA: Diagnosis not present

## 2017-01-23 DIAGNOSIS — E1165 Type 2 diabetes mellitus with hyperglycemia: Secondary | ICD-10-CM | POA: Diagnosis not present

## 2017-01-23 DIAGNOSIS — I110 Hypertensive heart disease with heart failure: Secondary | ICD-10-CM | POA: Diagnosis present

## 2017-01-23 DIAGNOSIS — C7800 Secondary malignant neoplasm of unspecified lung: Secondary | ICD-10-CM

## 2017-01-23 DIAGNOSIS — Z8249 Family history of ischemic heart disease and other diseases of the circulatory system: Secondary | ICD-10-CM | POA: Diagnosis not present

## 2017-01-23 DIAGNOSIS — I429 Cardiomyopathy, unspecified: Secondary | ICD-10-CM | POA: Diagnosis present

## 2017-01-23 DIAGNOSIS — E119 Type 2 diabetes mellitus without complications: Secondary | ICD-10-CM

## 2017-01-23 DIAGNOSIS — E1110 Type 2 diabetes mellitus with ketoacidosis without coma: Principal | ICD-10-CM | POA: Diagnosis present

## 2017-01-23 DIAGNOSIS — H3530 Unspecified macular degeneration: Secondary | ICD-10-CM | POA: Diagnosis not present

## 2017-01-23 DIAGNOSIS — Z794 Long term (current) use of insulin: Secondary | ICD-10-CM | POA: Diagnosis not present

## 2017-01-23 DIAGNOSIS — E781 Pure hyperglyceridemia: Secondary | ICD-10-CM | POA: Diagnosis not present

## 2017-01-23 DIAGNOSIS — Z905 Acquired absence of kidney: Secondary | ICD-10-CM | POA: Diagnosis not present

## 2017-01-23 DIAGNOSIS — Z808 Family history of malignant neoplasm of other organs or systems: Secondary | ICD-10-CM

## 2017-01-23 DIAGNOSIS — R531 Weakness: Secondary | ICD-10-CM | POA: Diagnosis not present

## 2017-01-23 DIAGNOSIS — E785 Hyperlipidemia, unspecified: Secondary | ICD-10-CM | POA: Diagnosis present

## 2017-01-23 DIAGNOSIS — C799 Secondary malignant neoplasm of unspecified site: Secondary | ICD-10-CM | POA: Diagnosis present

## 2017-01-23 DIAGNOSIS — I10 Essential (primary) hypertension: Secondary | ICD-10-CM | POA: Diagnosis not present

## 2017-01-23 DIAGNOSIS — E039 Hypothyroidism, unspecified: Secondary | ICD-10-CM | POA: Diagnosis present

## 2017-01-23 DIAGNOSIS — E871 Hypo-osmolality and hyponatremia: Secondary | ICD-10-CM | POA: Diagnosis present

## 2017-01-23 DIAGNOSIS — E875 Hyperkalemia: Secondary | ICD-10-CM | POA: Diagnosis present

## 2017-01-23 DIAGNOSIS — E861 Hypovolemia: Secondary | ICD-10-CM | POA: Diagnosis not present

## 2017-01-23 DIAGNOSIS — N179 Acute kidney failure, unspecified: Secondary | ICD-10-CM | POA: Diagnosis present

## 2017-01-23 DIAGNOSIS — E860 Dehydration: Secondary | ICD-10-CM | POA: Diagnosis present

## 2017-01-23 LAB — URINALYSIS, ROUTINE W REFLEX MICROSCOPIC
Bacteria, UA: NONE SEEN
Bilirubin Urine: NEGATIVE
Glucose, UA: >=500 mg/dL — AB
Ketones, ur: 80 mg/dL — AB
Leukocytes, UA: NEGATIVE
Nitrite: POSITIVE — AB
PROTEIN: 100 mg/dL — AB
SPECIFIC GRAVITY, URINE: 1.021 (ref 1.005–1.030)
pH: 5 (ref 5.0–8.0)

## 2017-01-23 LAB — CBC & DIFF AND RETIC
BASO%: 0.2 % (ref 0.0–2.0)
Basophils Absolute: 0 10*3/uL (ref 0.0–0.1)
EOS ABS: 0 10*3/uL (ref 0.0–0.5)
EOS%: 0.3 % (ref 0.0–7.0)
HEMATOCRIT: 47.1 % (ref 38.4–49.9)
HGB: 16.5 g/dL (ref 13.0–17.1)
IMMATURE RETIC FRACT: 6.9 % (ref 3.00–10.60)
LYMPH#: 1 10*3/uL (ref 0.9–3.3)
LYMPH%: 9.7 % — ABNORMAL LOW (ref 14.0–49.0)
MCH: 34.1 pg — ABNORMAL HIGH (ref 27.2–33.4)
MCHC: 35 g/dL (ref 32.0–36.0)
MCV: 97.3 fL (ref 79.3–98.0)
MONO#: 0.6 10*3/uL (ref 0.1–0.9)
MONO%: 5.7 % (ref 0.0–14.0)
NEUT%: 84.1 % — ABNORMAL HIGH (ref 39.0–75.0)
NEUTROS ABS: 8.6 10*3/uL — AB (ref 1.5–6.5)
PLATELETS: 309 10*3/uL (ref 140–400)
RBC: 4.84 10*6/uL (ref 4.20–5.82)
RDW: 15.3 % — ABNORMAL HIGH (ref 11.0–14.6)
RETIC %: 1.82 % — AB (ref 0.80–1.80)
RETIC CT ABS: 88.09 10*3/uL (ref 34.80–93.90)
WBC: 10.3 10*3/uL (ref 4.0–10.3)

## 2017-01-23 LAB — URINALYSIS, MICROSCOPIC - CHCC
BILIRUBIN (URINE): NEGATIVE
Glucose: 500 mg/dL
Ketones: 80 mg/dL
LEUKOCYTE ESTERASE: NEGATIVE
NITRITE: NEGATIVE
PH: 6 (ref 4.6–8.0)
Protein: 100 mg/dL
SPECIFIC GRAVITY, URINE: 1.03 (ref 1.003–1.035)
Urobilinogen, UR: 0.2 mg/dL (ref 0.2–1)

## 2017-01-23 LAB — CBC
HCT: 46.3 % (ref 39.0–52.0)
Hemoglobin: 16.4 g/dL (ref 13.0–17.0)
MCH: 34.1 pg — AB (ref 26.0–34.0)
MCHC: 35.4 g/dL (ref 30.0–36.0)
MCV: 96.3 fL (ref 78.0–100.0)
PLATELETS: 290 10*3/uL (ref 150–400)
RBC: 4.81 MIL/uL (ref 4.22–5.81)
RDW: 15.2 % (ref 11.5–15.5)
WBC: 11.2 10*3/uL — ABNORMAL HIGH (ref 4.0–10.5)

## 2017-01-23 LAB — BASIC METABOLIC PANEL
ANION GAP: 21 — AB (ref 5–15)
BUN: 31 mg/dL — ABNORMAL HIGH (ref 6–20)
CALCIUM: 9.1 mg/dL (ref 8.9–10.3)
CHLORIDE: 99 mmol/L — AB (ref 101–111)
CO2: 9 mmol/L — AB (ref 22–32)
Creatinine, Ser: 2.47 mg/dL — ABNORMAL HIGH (ref 0.61–1.24)
GFR calc non Af Amer: 27 mL/min — ABNORMAL LOW (ref >60)
GFR, EST AFRICAN AMERICAN: 32 mL/min — AB (ref >60)
Glucose, Bld: 548 mg/dL (ref 65–99)
Potassium: 5.6 mmol/L — ABNORMAL HIGH (ref 3.5–5.1)
Sodium: 129 mmol/L — ABNORMAL LOW (ref 135–145)

## 2017-01-23 LAB — COMPREHENSIVE METABOLIC PANEL
ALT: 17 U/L (ref 0–55)
ANION GAP: 20 meq/L — AB (ref 3–11)
AST: 15 U/L (ref 5–34)
Albumin: 4.3 g/dL (ref 3.5–5.0)
Alkaline Phosphatase: 92 U/L (ref 40–150)
BILIRUBIN TOTAL: 0.54 mg/dL (ref 0.20–1.20)
BUN: 26.8 mg/dL — AB (ref 7.0–26.0)
CALCIUM: 9.7 mg/dL (ref 8.4–10.4)
CO2: 11 meq/L — AB (ref 22–29)
CREATININE: 2.4 mg/dL — AB (ref 0.7–1.3)
Chloride: 101 mEq/L (ref 98–109)
EGFR: 29 mL/min/{1.73_m2} — ABNORMAL LOW (ref >90)
Glucose: 527 mg/dl — ABNORMAL HIGH (ref 70–140)
Potassium: 5.2 mEq/L — ABNORMAL HIGH (ref 3.5–5.1)
Sodium: 131 mEq/L — ABNORMAL LOW (ref 136–145)
TOTAL PROTEIN: 8.5 g/dL — AB (ref 6.4–8.3)

## 2017-01-23 LAB — CBG MONITORING, ED
GLUCOSE-CAPILLARY: 580 mg/dL — AB (ref 65–99)
GLUCOSE-CAPILLARY: 503 mg/dL — AB (ref 65–99)

## 2017-01-23 MED ORDER — SODIUM CHLORIDE 0.9 % IV SOLN: INTRAVENOUS | Status: DC

## 2017-01-23 MED ORDER — INSULIN REGULAR HUMAN 100 UNIT/ML IJ SOLN: 5.0000 [IU] | Freq: Three times a day (TID) | INTRAMUSCULAR | 4 refills | Status: DC

## 2017-01-23 MED ORDER — ONDANSETRON 4 MG PO TBDP
ORAL_TABLET | ORAL | Status: AC
  Filled 2017-01-23: qty 1

## 2017-01-23 MED ORDER — INSULIN GLARGINE 100 UNIT/ML SOLOSTAR PEN: 10.0000 [IU] | PEN_INJECTOR | Freq: Every day | SUBCUTANEOUS | 1 refills | Status: DC

## 2017-01-23 MED ORDER — LEVOTHYROXINE SODIUM 100 MCG IV SOLR
12.5000 ug | Freq: Every day | INTRAVENOUS | Status: DC
  Administered 2017-01-24: 12.5 ug via INTRAVENOUS
  Filled 2017-01-23 (×3): qty 5

## 2017-01-23 MED ORDER — DEXTROSE-NACL 5-0.45 % IV SOLN: INTRAVENOUS | Status: DC

## 2017-01-23 MED ORDER — SODIUM CHLORIDE 0.9 % IV BOLUS (SEPSIS)
1000.0000 mL | Freq: Once | INTRAVENOUS | Status: AC
  Administered 2017-01-23: 1000 mL via INTRAVENOUS

## 2017-01-23 MED ORDER — SODIUM CHLORIDE 0.9 % IV SOLN
INTRAVENOUS | Status: AC
  Administered 2017-01-24: 8 [IU]/h via INTRAVENOUS

## 2017-01-23 MED ORDER — INSULIN PEN NEEDLE 32G X 5 MM MISC: 1.0000 | Freq: Every day | 4 refills | Status: AC

## 2017-01-23 MED ORDER — BLOOD GLUCOSE MONITOR KIT: PACK | 0 refills | Status: DC

## 2017-01-23 MED ORDER — ENOXAPARIN SODIUM 40 MG/0.4ML ~~LOC~~ SOLN
40.0000 mg | Freq: Every day | SUBCUTANEOUS | Status: DC
  Administered 2017-01-24 – 2017-01-26 (×3): 40 mg via SUBCUTANEOUS
  Filled 2017-01-23 (×4): qty 0.4

## 2017-01-23 MED ORDER — DEXTROSE-NACL 5-0.45 % IV SOLN
INTRAVENOUS | Status: DC
  Administered 2017-01-24 (×2): via INTRAVENOUS

## 2017-01-23 MED ORDER — "INSULIN SYRINGE 31G X 5/16"" 1 ML MISC": 1.0000 | Freq: Three times a day (TID) | 6 refills | Status: AC

## 2017-01-23 MED ORDER — HYDRALAZINE HCL 20 MG/ML IJ SOLN: 10.0000 mg | INTRAMUSCULAR | Status: DC | PRN

## 2017-01-23 MED ORDER — SODIUM CHLORIDE 0.9 % IV SOLN
INTRAVENOUS | Status: DC
  Administered 2017-01-23: 4.4 [IU]/h via INTRAVENOUS
  Filled 2017-01-23 (×2): qty 1

## 2017-01-23 MED ORDER — SODIUM CHLORIDE 0.9 % IV SOLN
INTRAVENOUS | Status: DC
  Administered 2017-01-24 – 2017-01-25 (×2): via INTRAVENOUS

## 2017-01-23 NOTE — ED Provider Notes (Signed)
Emergency Department Provider Note   I have reviewed the triage vital signs and the nursing notes.   HISTORY  Chief Complaint Hyperglycemia   HPI Gregory [name] is a 56 y.o. male with PMH of HTN and HLD presents to the emergent department for evaluation of hyperglycemia. The patient was seen earlier today where blood work was obtained showing hyperglycemia. The patient was reportedly discharged home with insulin shots but continued to feel weak and have vomiting. He has very dry mucous membranes and so presented to the emergency department for further evaluation. No prior history of diabetes. Denies chest pain or difficulty breathing. No fevers or chills. Patient has had some urine frequency.    Past Medical History:  Diagnosis Date  . Allergy   . Anxiety   . History of gastrointestinal hemorrhage   . Hypertension   . Hypertriglyceridemia   . Macular degeneration    lasar surgery- Dr Zigmund Daniel  . Plantar warts   . PONV (postoperative nausea and vomiting)     Patient Active Problem List   Diagnosis Date Noted  . DKA (diabetic ketoacidosis) ([place]) 01/23/2017  . ARF (acute renal failure) ([place]) 01/23/2017  . Essential hypertension 01/23/2017  . Prediabetes 01/13/2017  . 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 Obesity (BMI 30-39.9) 08/22/2011  . Routine health maintenance 08/22/2011  . Pure hyperglyceridemia 06/21/2007  . ANXIETY DISORDER, ACUTE 06/21/2007  . MACULAR DEGENERATION, RIGHT EYE 06/21/2007  . Malignant essential hypertension with congestive heart failure ([place]) 06/21/2007  . ALLERGIC RHINITIS 06/21/2007    Past Surgical History:  Procedure Laterality Date  . CARDIAC CATHETERIZATION N/A 04/19/2016   Procedure: Left Heart Cath and Coronary Angiography;  Surgeon: Jolaine Artist, MD;  Location: [facility];  Service: Cardiovascular;  Laterality: N/A;  . COLONOSCOPY  03/22/2005  . COLOSTOMY    .  EYE SURGERY Right    laser surgery, right eye macular degeneration  . orif fx fibula    . sebaceous cyst excision  1985  . TONSILLECTOMY    . TOTAL SHOULDER ARTHROPLASTY Left 11/11/2014   Procedure: LEFT TOTAL SHOULDER ARTHROPLASTY;  Surgeon: Netta Cedars, MD;  Location: [facility];  Service: Orthopedics;  Laterality: Left;  Marland Kitchen VASECTOMY      Current Outpatient Rx  . Order #: [id number] Class: Print  . Order #: [id number] Class: Normal  . Order #: [id number] Class: Historical Med  . Order #: [id number] Class: Normal  . Order #: [id number] Class: Normal  . Order #: [id number] Class: Normal  . Order #: [id number] Class: Normal  . Order #: [id number] Class: Normal  . Order #: [id number] Class: Historical Med  . Order #: [id number] Class: Historical Med  . Order #: [id number] Class: Print  . Order #: [id number] Class: Historical Med  . Order #: [id number] Class: Normal    Allergies Patient has no known allergies.  Family History  Problem Relation Age of Onset  . Hypertension Mother   . Bipolar disorder Mother   . Cancer Mother 55       Brain  . Other Father        brain tumor  . Colon cancer Neg Hx   . Colon polyps Neg Hx   . Rectal cancer Neg Hx   . Stomach cancer Neg Hx   . Early death Neg Hx   . Hyperlipidemia Neg Hx   . Kidney disease Neg Hx     Social History Social History  Substance Use Topics  . Smoking status: Never Smoker  .  Smokeless tobacco: Never Used  . Alcohol use No     Comment: social    Review of Systems  Constitutional: No fever/chills. Positive fatigue.  Eyes: No visual changes. ENT: No sore throat. Cardiovascular: Denies chest pain. Respiratory: Denies shortness of breath. Gastrointestinal: No abdominal pain. Positive nausea and vomiting.  No diarrhea.  No constipation. Genitourinary: Negative for dysuria. Musculoskeletal: Negative for back pain. Skin: Negative for rash. Neurological: Negative for headaches, focal weakness or numbness.  10-point ROS otherwise  negative.  ____________________________________________   PHYSICAL EXAM:  VITAL SIGNS: ED Triage Vitals  Enc Vitals Group     BP 01/23/17 2056 (!) 145/87     Pulse Rate 01/23/17 2056 (!) 110     Resp 01/23/17 2056 19     Temp 01/23/17 2056 98.5 F (36.9 C)     Temp Source 01/23/17 2056 Oral     SpO2 01/23/17 2056 99 %     Weight 01/23/17 2058 228 lb (103.4 kg)     Height 01/23/17 2058 6\' 2"  (1.88 m)     Pain Score 01/23/17 2059 0   Constitutional: Alert and oriented. Well appearing and in no acute distress. Eyes: Conjunctivae are normal.  Head: Atraumatic. Nose: No congestion/rhinnorhea. Mouth/Throat: Mucous membranes are extremely dry.  Neck: No stridor.   Cardiovascular: Sinus tachycardia. Good peripheral circulation. Grossly normal heart sounds.   Respiratory: Normal respiratory effort.  No retractions. Lungs CTAB. Gastrointestinal: Soft and nontender. No distention.  Musculoskeletal: No lower extremity tenderness nor edema. No gross deformities of extremities. Neurologic:  Normal speech and language. No gross focal neurologic deficits are appreciated.  Skin:  Skin is warm, dry and intact. No rash noted.  ____________________________________________   LABS (all labs ordered are listed, but only abnormal results are displayed)  Labs Reviewed  BASIC METABOLIC PANEL - Abnormal; Notable for the following:       Result Value   Sodium 129 (*)    Potassium 5.6 (*)    Chloride 99 (*)    CO2 9 (*)    Glucose, Bld 548 (*)    BUN 31 (*)    Creatinine, Ser 2.47 (*)    GFR calc non Af Amer 27 (*)    GFR calc Af Amer 32 (*)    Anion gap 21 (*)    All other components within normal limits  CBC - Abnormal; Notable for the following:    WBC 11.2 (*)    MCH 34.1 (*)    All other components within normal limits  URINALYSIS, ROUTINE W REFLEX MICROSCOPIC - Abnormal; Notable for the following:    Color, Urine AMBER (*)    APPearance HAZY (*)    Glucose, UA >=500 (*)    Hgb  urine dipstick MODERATE (*)    Ketones, ur 80 (*)    Protein, ur 100 (*)    Nitrite POSITIVE (*)    Squamous Epithelial / LPF 0-5 (*)    All other components within normal limits  CBG MONITORING, ED - Abnormal; Notable for the following:    Glucose-Capillary 580 (*)    All other components within normal limits   ____________________________________________  RADIOLOGY  None ____________________________________________   PROCEDURES  Procedure(s) performed:   Procedures  CRITICAL CARE Performed by: Margette Fast Total critical care time: 45 minutes Critical care time was exclusive of separately billable procedures and treating other patients. Critical care was necessary to treat or prevent imminent or life-threatening deterioration. Critical care was time spent personally by  me on the following activities: development of treatment plan with patient and/or surrogate as well as nursing, discussions with consultants, evaluation of patient's response to treatment, examination of patient, obtaining history from patient or surrogate, ordering and performing treatments and interventions, ordering and review of laboratory studies, ordering and review of radiographic studies, pulse oximetry and re-evaluation of patient's condition.  Nanda Quinton, MD Emergency Medicine  ____________________________________________   INITIAL IMPRESSION / ASSESSMENT AND PLAN / ED COURSE  Pertinent labs & imaging results that were available during my care of the patient were reviewed by me and considered in my medical decision making (see chart for details).  Call from triage to evaluate patient with new DM presentation and DKA. Labs from earlier today show blood sugar in the 500s with gap of 20 and CO2 of 11. Patient has significant dry mucous membranes. Possible UTI as inciting event. I initiated IV fluid bolus, insulin drip, and repeat labs to assess potassium but labs from 3PM show potassium of 5.2.    Discussed patient's case with Hospitalist, Dr. Hal Hope to request admission. Patient and family (if present) updated with plan. Care transferred to Hospitalist service.  I reviewed all nursing notes, vitals, pertinent old records, EKGs, labs, imaging (as available).  ____________________________________________  FINAL CLINICAL IMPRESSION(S) / ED DIAGNOSES  Final diagnoses:  Diabetic ketoacidosis without coma associated with other specified diabetes mellitus ([place])     MEDICATIONS GIVEN DURING THIS VISIT:  Medications  ondansetron (ZOFRAN-ODT) 4 MG disintegrating tablet (not administered)  insulin regular (NOVOLIN R,HUMULIN R) 100 Units in sodium chloride 0.9 % 100 mL (1 Units/mL) infusion (not administered)  sodium chloride 0.9 % bolus 1,000 mL (0 mLs Intravenous Stopped 01/23/17 2222)    And  0.9 %  sodium chloride infusion (not administered)  dextrose 5 %-0.45 % sodium chloride infusion (not administered)     NEW OUTPATIENT MEDICATIONS STARTED DURING THIS VISIT:  None   Note:  This document was prepared using Dragon voice recognition software and may include unintentional dictation errors.  Nanda Quinton, MD Emergency Medicine    Feleshia Zundel, [facility], MD 01/23/17 2252

## 2017-01-23 NOTE — Telephone Encounter (Signed)
Wife called that pt is driving home today. He is feeling a little woozy. She hears SOB but he denies this. He will be in [place] about 3 or 330.  Called pt he is still having frequency to void.  He has had 3 doses of the cipro and 3 doses of Azo. This am was dizzy and a little disoriented, and a little unsteady on his feet.  He threw up just now his dinner last night. No fever.   He will be in [place] about 3 pm.   S/w Dr Alvy Bimler and Lucianne Lei. Pt for labs and [facility] today. inbasket sent to scheduler, CBC CMET UA&C ordered.  Called wife back to let her know.

## 2017-01-23 NOTE — ED Triage Notes (Signed)
Pt was just seen at dr and they noted his CBG was elevated (500's, pt has no prior hx of diabetes) They sent him home where he continued to vomit, felt weak, etc. Decided to come in hospital. Per pt bloodwork from earlier pt is in DKA. CBg 580, gap 20.

## 2017-01-23 NOTE — H&P (Signed)
History and Physical    Gregory [name] JKK:[id number] DOB: [birth year] DOA: 01/23/2017  PCP: [name], MD  Patient coming from: Home.  Chief Complaint: Weakness and elevated blood sugar.  HPI: Gregory [name] is a 56 y.o. male with history of metastatic renal cell carcinoma, hypertension, nonischemic cardiomyopathy and hypothyroidism presents to the ER with complaints of persistent weakness with nausea vomiting. Patient's symptoms have been present for last 2 days. Patient had followed up with his oncologist and was found to have elevated blood sugar and was placed on Lantus today. Since patient was feeling weak patient came to the ER.   ED Course: Labs in the ER shows elevated gap with elevated blood sugar consistent with diabetic ketoacidosis. Patient was started on IV fluid bolus and IV insulin infusion. Patient admitted for diabetic ketoacidosis. Patient denies any chest pain or shortness of breath. He denies nausea vomiting but abdomen appears benign. Denies any diarrhea or abdominal pain.  Review of Systems: As per HPI, rest all negative.   Past Medical History:  Diagnosis Date  . Allergy   . Anxiety   . History of gastrointestinal hemorrhage   . Hypertension   . Hypertriglyceridemia   . Macular degeneration    lasar surgery- Dr Zigmund Daniel  . Plantar warts   . PONV (postoperative nausea and vomiting)     Past Surgical History:  Procedure Laterality Date  . CARDIAC CATHETERIZATION N/A 04/19/2016   Procedure: Left Heart Cath and Coronary Angiography;  Surgeon: Jolaine Artist, MD;  Location: [facility];  Service: Cardiovascular;  Laterality: N/A;  . COLONOSCOPY  03/22/2005  . COLOSTOMY    . EYE SURGERY Right    laser surgery, right eye macular degeneration  . orif fx fibula    . sebaceous cyst excision  1985  . TONSILLECTOMY    . TOTAL SHOULDER ARTHROPLASTY Left 11/11/2014   Procedure: LEFT TOTAL SHOULDER ARTHROPLASTY;  Surgeon: Netta Cedars, MD;  Location: [facility];  Service: Orthopedics;  Laterality: Left;  Marland Kitchen VASECTOMY       reports that he has never smoked. He has never used smokeless tobacco. He reports that he does not drink alcohol or use drugs.  No Known Allergies  Family History  Problem Relation Age of Onset  . Hypertension Mother   . Bipolar disorder Mother   . Cancer Mother 38       Brain  . Other Father        brain tumor  . Colon cancer Neg Hx   . Colon polyps Neg Hx   . Rectal cancer Neg Hx   . Stomach cancer Neg Hx   . Early death Neg Hx   . Hyperlipidemia Neg Hx   . Kidney disease Neg Hx     Prior to Admission medications   Medication Sig Start Date End Date Taking? Authorizing Provider  blood glucose meter kit and supplies KIT Dispense based on patient and insurance preference. Use up to four times daily as directed. (FOR ICD-9 250.00, 250.01). 01/23/17   Sandi Mealy E., PA-C  carvedilol (COREG) 3.125 MG tablet TAKE 1 TABLET TWICE DAILY. 09/17/16   Larey Dresser, MD  cetirizine (ZYRTEC) 10 MG tablet Take 10 mg by mouth daily.    [provider]  ciprofloxacin (CIPRO) 500 MG tablet Take 1 tablet (500 mg total) by mouth 2 (two) times daily. 01/22/17   Heath Lark, MD  Insulin Glargine (LANTUS) 100 UNIT/ML Solostar Pen Inject 10 Units into  the skin at bedtime. 01/23/17   Harle Stanford., PA-C  Insulin Pen Needle 32G X 5 MM MISC 1 each by Does not apply route at bedtime. For Solostar Pen 01/23/17 01/23/18  Tanner, Lyndon Code., PA-C  insulin regular (HUMULIN R) 100 units/mL injection Inject 0.05 mLs (5 Units total) into the skin 3 (three) times daily before meals. 01/23/17 01/23/18  Harle Stanford., PA-C  Insulin Syringe-Needle U-100 (INSULIN SYRINGE 1CC/31GX5/16") 31G X 5/16" 1 ML MISC 1 each by Does not apply route 3 (three) times daily before meals. 01/23/17 01/23/18  Harle Stanford., PA-C  levothyroxine (SYNTHROID, LEVOTHROID) 25 MCG tablet Take by mouth. 12/03/16   [provider]  losartan (COZAAR) 50 MG tablet  09/09/16    [provider]  PARoxetine (PAXIL-CR) 25 MG 24 hr tablet Take 1 tablet (25 mg total) by mouth daily. 11/15/16   Wyatt Portela, MD  phenazopyridine (PYRIDIUM) 95 MG tablet Take 95 mg by mouth 3 (three) times daily as needed for pain.    [provider]  sacubitril-valsartan (ENTRESTO) 49-51 MG Take 1 tablet by mouth 2 (two) times daily. 09/12/16   Bensimhon, Shaune Pascal, MD    Physical Exam: Vitals:   01/23/17 2056 01/23/17 2058  BP: (!) 145/87   Pulse: (!) 110   Resp: 19   Temp: 98.5 F (36.9 C)   TempSrc: Oral   SpO2: 99%   Weight:  103.4 kg (228 lb)  Height:  _0  (1.88 m)      Constitutional: Moderately built and nourished. Vitals:   01/23/17 2056 01/23/17 2058  BP: (!) 145/87   Pulse: (!) 110   Resp: 19   Temp: 98.5 F (36.9 C)   TempSrc: Oral   SpO2: 99%   Weight:  103.4 kg (228 lb)  Height:  _1  (1.88 m)   Eyes: Anicteric no pallor. ENMT: No discharge from the ears eyes nose and mouth. Neck: No mass felt. No neck rigidity. No JVD appreciated. Respiratory: No rhonchi or crepitations. Cardiovascular: S1 and S2 heard no murmurs appreciated. Abdomen: Soft nontender bowel sounds present. Musculoskeletal: No edema. No joint effusion. Skin: No rash. Skin appears warm. Neurologic: Alert awake oriented to time place and person. Moves all extremities. Psychiatric: Appears normal. Normal affect.   Labs on Admission: I have personally reviewed following labs and imaging studies  CBC:  Recent Labs Lab 01/23/17 1447 01/23/17 2106  WBC 10.3 11.2*  NEUTROABS 8.6*  --   HGB 16.5 16.4  HCT 47.1 46.3  MCV 97.3 96.3  PLT 309 471   Basic Metabolic Panel:  Recent Labs Lab 01/23/17 1448 01/23/17 2106  NA 131* 129*  K 5.2* 5.6*  CL  --  99*  CO2 11* 9*  GLUCOSE 527* 548*  BUN 26.8* 31*  CREATININE 2.4* 2.47*  CALCIUM 9.7 9.1   GFR: Estimated Creatinine Clearance: 42.3 mL/min (A) (by C-G formula based on SCr of 2.47 mg/dL (H)). Liver  Function Tests:  Recent Labs Lab 01/23/17 1448  AST 15  ALT 17  ALKPHOS 92  BILITOT 0.54  PROT 8.5*  ALBUMIN 4.3   No results for input(s): LIPASE, AMYLASE in the last 168 hours. No results for input(s): AMMONIA in the last 168 hours. Coagulation Profile: No results for input(s): INR, PROTIME in the last 168 hours. Cardiac Enzymes: No results for input(s): CKTOTAL, CKMB, CKMBINDEX, TROPONINI in the last 168 hours. BNP (last 3 results) No results for input(s): PROBNP in the last 8760 hours.  HbA1C: No results for input(s): HGBA1C in the last 72 hours. CBG:  Recent Labs Lab 01/23/17 2101 01/23/17 2304  GLUCAP 580* 503*   Lipid Profile: No results for input(s): CHOL, HDL, LDLCALC, TRIG, CHOLHDL, LDLDIRECT in the last 72 hours. Thyroid Function Tests: No results for input(s): TSH, T4TOTAL, FREET4, T3FREE, THYROIDAB in the last 72 hours. Anemia Panel:  Recent Labs  01/23/17 1447  RETICCTPCT 1.82*   Urine analysis:    Component Value Date/Time   COLORURINE AMBER (A) 01/23/2017 2106   APPEARANCEUR HAZY (A) 01/23/2017 2106   LABSPEC 1.021 01/23/2017 2106   LABSPEC 1.030 01/23/2017 1448   PHURINE 5.0 01/23/2017 2106   GLUCOSEU >=500 (A) 01/23/2017 2106   GLUCOSEU 500 01/23/2017 1448   HGBUR MODERATE (A) 01/23/2017 2106   BILIRUBINUR NEGATIVE 01/23/2017 2106   BILIRUBINUR Negative 01/23/2017 1448   KETONESUR 80 (A) 01/23/2017 2106   PROTEINUR 100 (A) 01/23/2017 2106   UROBILINOGEN 0.2 01/23/2017 1448   NITRITE POSITIVE (A) 01/23/2017 2106   LEUKOCYTESUR NEGATIVE 01/23/2017 2106   LEUKOCYTESUR Negative 01/23/2017 1448   Sepsis Labs: _0 (procalcitonin:4,lacticidven:4) )No results found for this or any previous visit (from the past 240 hour(s)).   Radiological Exams on Admission: No results found.   Assessment/Plan Principal Problem:   DKA (diabetic ketoacidosis) ([place]) Active Problems:   Pure hyperglyceridemia   Primary malignant neoplasm of kidney  with metastasis from kidney to other site [facility])   ARF (acute renal failure) ([facility])   Essential hypertension   DKA (diabetic ketoacidoses) ([place])    1. Diabetic ketoacidosis and new-onset diabetes likely type II - patient has been placed on IV insulin infusion and IV fluids. Once anion gap gets character changed to long-acting insulin. Check hemoglobin A1c. Closely for metabolic panel. 2. History of nonischemic cardiomyopathy - presently receiving IV fluids since patient is in DKA. Closely follow respiratory status. 3. Hypothyroidism on IV Synthroid for now. 4. Renal cell carcinoma with metastasis being followed by Dr. Alen Blew oncologist.  I have reviewed patient's old charts and labs.   DVT prophylaxis: Lovenox. Code Status: Full code.  Family Communication: Discussed with patient.  Disposition Plan: Home.  Consults called: None.  Admission status: Inpatient.    Rise Patience MD [facility] Pager [phone].  If 7PM-7AM, please contact night-coverage [email or web address] Password [email or web address]  01/23/2017, 11:40 PM

## 2017-01-24 ENCOUNTER — Telehealth: Payer: Self-pay | Admitting: *Deleted

## 2017-01-24 ENCOUNTER — Other Ambulatory Visit: Payer: BLUE CROSS/BLUE SHIELD

## 2017-01-24 ENCOUNTER — Other Ambulatory Visit: Payer: Self-pay

## 2017-01-24 ENCOUNTER — Ambulatory Visit: Payer: BLUE CROSS/BLUE SHIELD

## 2017-01-24 DIAGNOSIS — I10 Essential (primary) hypertension: Secondary | ICD-10-CM

## 2017-01-24 DIAGNOSIS — C649 Malignant neoplasm of unspecified kidney, except renal pelvis: Secondary | ICD-10-CM

## 2017-01-24 DIAGNOSIS — I5022 Chronic systolic (congestive) heart failure: Secondary | ICD-10-CM | POA: Diagnosis present

## 2017-01-24 DIAGNOSIS — E1110 Type 2 diabetes mellitus with ketoacidosis without coma: Principal | ICD-10-CM

## 2017-01-24 LAB — CBG MONITORING, ED
GLUCOSE-CAPILLARY: 101 mg/dL — AB (ref 65–99)
GLUCOSE-CAPILLARY: 106 mg/dL — AB (ref 65–99)
GLUCOSE-CAPILLARY: 123 mg/dL — AB (ref 65–99)
GLUCOSE-CAPILLARY: 157 mg/dL — AB (ref 65–99)
GLUCOSE-CAPILLARY: 218 mg/dL — AB (ref 65–99)
GLUCOSE-CAPILLARY: 231 mg/dL — AB (ref 65–99)
GLUCOSE-CAPILLARY: 257 mg/dL — AB (ref 65–99)
GLUCOSE-CAPILLARY: 291 mg/dL — AB (ref 65–99)
GLUCOSE-CAPILLARY: 459 mg/dL — AB (ref 65–99)
Glucose-Capillary: 423 mg/dL — ABNORMAL HIGH (ref 65–99)
Glucose-Capillary: 366 mg/dL — ABNORMAL HIGH (ref 65–99)
Glucose-Capillary: 191 mg/dL — ABNORMAL HIGH (ref 65–99)
Glucose-Capillary: 128 mg/dL — ABNORMAL HIGH (ref 65–99)
Glucose-Capillary: 101 mg/dL — ABNORMAL HIGH (ref 65–99)

## 2017-01-24 LAB — BASIC METABOLIC PANEL
Anion gap: 14 (ref 5–15)
Anion gap: 11 (ref 5–15)
Anion gap: 5 (ref 5–15)
Anion gap: 7 (ref 5–15)
BUN: 30 mg/dL — AB (ref 6–20)
BUN: 27 mg/dL — AB (ref 6–20)
BUN: 22 mg/dL — AB (ref 6–20)
BUN: 19 mg/dL (ref 6–20)
CALCIUM: 8.4 mg/dL — AB (ref 8.9–10.3)
CHLORIDE: 107 mmol/L (ref 101–111)
CHLORIDE: 109 mmol/L (ref 101–111)
CHLORIDE: 112 mmol/L — AB (ref 101–111)
CHLORIDE: 111 mmol/L (ref 101–111)
CO2: 12 mmol/L — ABNORMAL LOW (ref 22–32)
CO2: 13 mmol/L — ABNORMAL LOW (ref 22–32)
CO2: 19 mmol/L — ABNORMAL LOW (ref 22–32)
CO2: 19 mmol/L — AB (ref 22–32)
CREATININE: 2.29 mg/dL — AB (ref 0.61–1.24)
CREATININE: 2.05 mg/dL — AB (ref 0.61–1.24)
CREATININE: 1.75 mg/dL — AB (ref 0.61–1.24)
CREATININE: 1.62 mg/dL — AB (ref 0.61–1.24)
Calcium: 8.2 mg/dL — ABNORMAL LOW (ref 8.9–10.3)
Calcium: 8.2 mg/dL — ABNORMAL LOW (ref 8.9–10.3)
Calcium: 8.3 mg/dL — ABNORMAL LOW (ref 8.9–10.3)
GFR calc Af Amer: 35 mL/min — ABNORMAL LOW (ref >60)
GFR calc Af Amer: 40 mL/min — ABNORMAL LOW (ref >60)
GFR calc Af Amer: 48 mL/min — ABNORMAL LOW (ref >60)
GFR calc Af Amer: 53 mL/min — ABNORMAL LOW (ref >60)
GFR calc non Af Amer: 34 mL/min — ABNORMAL LOW (ref >60)
GFR calc non Af Amer: 41 mL/min — ABNORMAL LOW (ref >60)
GFR calc non Af Amer: 46 mL/min — ABNORMAL LOW (ref >60)
GFR, EST NON AFRICAN AMERICAN: 30 mL/min — AB (ref >60)
Glucose, Bld: 414 mg/dL — ABNORMAL HIGH (ref 65–99)
Glucose, Bld: 315 mg/dL — ABNORMAL HIGH (ref 65–99)
Glucose, Bld: 141 mg/dL — ABNORMAL HIGH (ref 65–99)
Glucose, Bld: 137 mg/dL — ABNORMAL HIGH (ref 65–99)
Potassium: 4.5 mmol/L (ref 3.5–5.1)
Potassium: 4.1 mmol/L (ref 3.5–5.1)
Potassium: 3.6 mmol/L (ref 3.5–5.1)
Potassium: 3.5 mmol/L (ref 3.5–5.1)
SODIUM: 133 mmol/L — AB (ref 135–145)
SODIUM: 133 mmol/L — AB (ref 135–145)
Sodium: 136 mmol/L (ref 135–145)
Sodium: 137 mmol/L (ref 135–145)

## 2017-01-24 LAB — URINALYSIS, ROUTINE W REFLEX MICROSCOPIC
Bilirubin Urine: NEGATIVE
Glucose, UA: 150 mg/dL — AB
Ketones, ur: 20 mg/dL — AB
Nitrite: NEGATIVE
PROTEIN: 30 mg/dL — AB
Specific Gravity, Urine: 1.01 (ref 1.005–1.030)
pH: 6 (ref 5.0–8.0)

## 2017-01-24 LAB — GLUCOSE, CAPILLARY
GLUCOSE-CAPILLARY: 291 mg/dL — AB (ref 65–99)
Glucose-Capillary: 114 mg/dL — ABNORMAL HIGH (ref 65–99)

## 2017-01-24 LAB — HEMOGLOBIN A1C
HEMOGLOBIN A1C: 7.5 % — AB (ref 4.8–5.6)
Mean Plasma Glucose: 168.55 mg/dL

## 2017-01-24 LAB — URINE CULTURE: ORGANISM ID, BACTERIA: NO GROWTH

## 2017-01-24 LAB — TSH: TSH: 0.898 m[IU]/L (ref 0.320–4.118)

## 2017-01-24 MED ORDER — LORATADINE 10 MG PO TABS
10.0000 mg | ORAL_TABLET | Freq: Every day | ORAL | Status: DC
  Administered 2017-01-24 – 2017-01-26 (×3): 10 mg via ORAL
  Filled 2017-01-24 (×3): qty 1

## 2017-01-24 MED ORDER — INSULIN REGULAR HUMAN 100 UNIT/ML IJ SOLN: 5.0000 [IU] | Freq: Three times a day (TID) | INTRAMUSCULAR | 5 refills | Status: DC

## 2017-01-24 MED ORDER — INSULIN GLARGINE 100 UNIT/ML ~~LOC~~ SOLN
10.0000 [IU] | Freq: Every day | SUBCUTANEOUS | Status: DC
  Administered 2017-01-24: 10 [IU] via SUBCUTANEOUS
  Filled 2017-01-24 (×2): qty 0.1

## 2017-01-24 MED ORDER — LEVOTHYROXINE SODIUM 25 MCG PO TABS: 25.0000 ug | ORAL_TABLET | Freq: Every day | ORAL | Status: DC

## 2017-01-24 MED ORDER — INSULIN ASPART 100 UNIT/ML ~~LOC~~ SOLN
0.0000 [IU] | Freq: Three times a day (TID) | SUBCUTANEOUS | Status: DC
  Administered 2017-01-25: 7 [IU] via SUBCUTANEOUS
  Administered 2017-01-25: 3 [IU] via SUBCUTANEOUS
  Administered 2017-01-25: 9 [IU] via SUBCUTANEOUS
  Administered 2017-01-26: 2 [IU] via SUBCUTANEOUS
  Administered 2017-01-26: 3 [IU] via SUBCUTANEOUS

## 2017-01-24 MED ORDER — PAROXETINE HCL ER 25 MG PO TB24
25.0000 mg | ORAL_TABLET | Freq: Every day | ORAL | Status: DC
  Administered 2017-01-24 – 2017-01-26 (×3): 25 mg via ORAL
  Filled 2017-01-24 (×3): qty 1

## 2017-01-24 MED ORDER — INSULIN ASPART 100 UNIT/ML ~~LOC~~ SOLN
0.0000 [IU] | Freq: Every day | SUBCUTANEOUS | Status: DC
  Administered 2017-01-24: 3 [IU] via SUBCUTANEOUS
  Administered 2017-01-25: 2 [IU] via SUBCUTANEOUS

## 2017-01-24 NOTE — Progress Notes (Signed)
Inpatient Diabetes Program Recommendations  AACE/ADA: New Consensus Statement on Inpatient Glycemic Control (2015)  Target Ranges:  Prepandial:   less than 140 mg/dL      Peak postprandial:   less than 180 mg/dL (1-2 hours)      Critically ill patients:  140 - 180 mg/dL   Results for Gregory [name], Gregory [name] (MRN [id number]) as of 01/24/2017 09:52  Ref. Range 01/23/2017 14:48  Sodium Latest Ref Range: 136 - 145 mEq/L 131 (L)  Potassium Latest Ref Range: 3.5 - 5.1 mEq/L 5.2 (H)  Chloride Latest Ref Range: 98 - 109 mEq/L 101  CO2 Latest Ref Range: 22 - 29 mEq/L 11 (L)  Glucose Latest Ref Range: 70 - 140 mg/dl 527 (H)  BUN Latest Ref Range: 7.0 - 26.0 mg/dL 26.8 (H)  Creatinine Latest Ref Range: 0.7 - 1.3 mg/dL 2.4 (H)  Calcium Latest Ref Range: 8.4 - 10.4 mg/dL 9.7  Anion gap Latest Ref Range: 3 - 11 mEq/L 20 (H)    Admit with: DKA/ New Diagnosis of DM  History: Renal Cell Carcinoma  Home DM Meds: Lantus 10 units QHS        Regular Insulin 5 units TID with meals        (just given Rxs for these 2 insulins yesterday 08/30)  Current Insulin Orders: IV Insulin drip       Note patient with new diagnosis of Diabetes.  Saw his PCP on 01/13/17.  Was given Rx for Lantus and Regular Insulins yesterday at [place] office.  Started on IV Insulin drip per DKA Protocol.  Plan to see pt to discuss new diagnosis of Diabetes.     --Will follow patient during hospitalization--  Wyn Quaker RN, MSN, CDE Diabetes Coordinator Inpatient Glycemic Control Team Team Pager: [phone] (8a-5p)

## 2017-01-24 NOTE — Progress Notes (Signed)
[facility]                                                                              Patient Demographics  Gregory [name], is a 56 y.o. male, DOB - June 05, 1960, KGM:[id number]  Admit date - 01/23/2017   Admitting Physician No admitting provider for patient encounter.  Outpatient Primary MD for the patient is [name], MD  Outpatient specialists:   LOS - 1  days   Medical records reviewed and are as summarized below:    Chief Complaint  Patient presents with  . Hyperglycemia       Brief summary   Gregory [name] is a 56 y.o. male with history of metastatic renal cell carcinoma status post nephrectomy, hypertension, nonischemic cardiomyopathy and hypothyroidism presented with persistent nausea and vomiting and weakness for last 2 days. Patient had followed up with his oncologist and was found to have elevated blood sugar and was placed on Lantus on the day of admission. Labs in the ER shows elevated gap with elevated blood sugar consistent with diabetic ketoacidosis.   Assessment & Plan    Principal Problem:   DKA (diabetic ketoacidosis) ([place]) with new onset diabetes type 2 - Patient presented with hyperkalemia, hyponatremia, acute kidney injury with creatinine of 2.47, hyperglycemia, anion gap of 21, bicarbonate 9 - surprisingly his A1c was 5.8 on 8/20 - the patient has been working out and eating poorly in the last 1 month to lose weight.  - continue IV insulin drip, IV fluid hydration until bicarbonate better, gap closed and CBGs <180 x 4 - diabetic coordinator consult, nutrition consult, will need insulin teaching - Repeat UA and culture  Active Problems: Acute kidney injury: Due to DKA and profound dehydration - presented with creatinine of 2.47, improved to 2.0 with IV fluid hydration - continue serial edema  Hyperkalemia - patient presented with potassium of 5.6 due to profound DKA and dehydration, meds  - Currently on IV insulin drip,  potassium improved to 4.1 - hold losartan, entresto  Hyponatremia - patient presented with sodium of 129, pseudohyponatremia with blood glucose of 548, corrected sodium was 136 - Continue IV fluid hydration     Primary malignant neoplasm of kidney with metastasis from kidney to other site [facility]) - status post nephrectomy, follows Dr. Alen Blew and MD O[place]    Essential hypertension - currently BP soft - hold Coreg, losartan, entresto    Chronic systolic CHF (congestive heart failure) ([place]) -  Had profound dehydration and hypovolemia at the time of admission - hold losartan, entresto  Hypothyroidism - will continue Synthroid, TSH on 8/30 was 0.89   Code Status: full  DVT Prophylaxis:  Lovenox  Family Communication: Discussed in detail with the patient, all imaging results, lab results explained to the patient   Disposition Plan:   Time Spent in minutes  35 minutes  Procedures:  None   Consultants:   None   Antimicrobials:      Medications  Scheduled Meds: . enoxaparin (LOVENOX) injection  40 mg Subcutaneous Daily  . levothyroxine  12.5 mcg Intravenous Daily   Continuous Infusions: . sodium chloride 125 mL/hr  at 01/24/17 0020  . dextrose 5 % and 0.45% NaCl 100 mL/hr at 01/24/17 0658  . insulin (NOVOLIN-R) infusion 10.5 Units/hr (01/24/17 0928)   PRN Meds:.hydrALAZINE   Antibiotics   Anti-infectives    None        Subjective:   Gregory [name] was seen and examined today.  Still feeling dehydrated, weak. Patient denies dizziness, chest pain, shortness of breath, abdominal pain, N/V/D/C.   Objective:   Vitals:   01/24/17 0630 01/24/17 0700 01/24/17 0730 01/24/17 0800  BP: (!) 94/53 111/74 (!) 93/50 106/71  Pulse: 87 84 83 74  Resp: 14 13 14 13   Temp:      TempSrc:      SpO2: 98% 98% 98% 98%  Weight:      Height:        Intake/Output Summary (Last 24 hours) at 01/24/17 1037 Last data filed at 01/24/17 0200  Gross per 24 hour  Intake           3004.77 ml  Output              350 ml  Net          2654.77 ml     Wt Readings from Last 3 Encounters:  01/23/17 103.4 kg (228 lb)  01/23/17 103.8 kg (228 lb 14.4 oz)  01/13/17 112.5 kg (248 lb)     Exam  General: Alert and oriented x 3, NAD, dry mucous membranes  Eyes:  HEENT:  Atraumatic, normocephalic, normal oropharynx  Cardiovascular: S1 S2 auscultated, no rubs, murmurs or gallops. Regular rate and rhythm.  Respiratory: Clear to auscultation bilaterally, no wheezing, rales or rhonchi  Gastrointestinal: Soft, nontender, nondistended, + bowel sounds  Ext: no pedal edema bilaterally  Neuro: AAOx3, Cr N's II- XII. Strength 5/5 upper and lower extremities bilaterally, speech clear, sensations grossly intact  Musculoskeletal: No digital cyanosis, clubbing  Skin: No rashes  Psych: Normal affect and demeanor, alert and oriented x3    Data Reviewed:  I have personally reviewed following labs and imaging studies  Micro Results No results found for this or any previous visit (from the past 240 hour(s)).  Radiology Reports No results found.  Lab Data:  CBC:  Recent Labs Lab 01/23/17 1447 01/23/17 2106  WBC 10.3 11.2*  NEUTROABS 8.6*  --   HGB 16.5 16.4  HCT 47.1 46.3  MCV 97.3 96.3  PLT 309 419   Basic Metabolic Panel:  Recent Labs Lab 01/23/17 1448 01/23/17 2106 01/24/17 0212 01/24/17 0419  NA 131* 129* 133* 133*  K 5.2* 5.6* 4.5 4.1  CL  --  99* 107 109  CO2 11* 9* 12* 13*  GLUCOSE 527* 548* 414* 315*  BUN 26.8* 31* 30* 27*  CREATININE 2.4* 2.47* 2.29* 2.05*  CALCIUM 9.7 9.1 8.4* 8.2*   GFR: Estimated Creatinine Clearance: 51 mL/min (A) (by C-G formula based on SCr of 2.05 mg/dL (H)). Liver Function Tests:  Recent Labs Lab 01/23/17 1448  AST 15  ALT 17  ALKPHOS 92  BILITOT 0.54  PROT 8.5*  ALBUMIN 4.3   No results for input(s): LIPASE, AMYLASE in the last 168 hours. No results for input(s): AMMONIA in the last 168  hours. Coagulation Profile: No results for input(s): INR, PROTIME in the last 168 hours. Cardiac Enzymes: No results for input(s): CKTOTAL, CKMB, CKMBINDEX, TROPONINI in the last 168 hours. BNP (last 3 results) No results for input(s): PROBNP in the last 8760 hours. HbA1C: No results for input(s): HGBA1C in  the last 72 hours. CBG:  Recent Labs Lab 01/24/17 0548 01/24/17 0654 01/24/17 0802 01/24/17 0916 01/24/17 1026  GLUCAP 257* 231* 218* 191* 157*   Lipid Profile: No results for input(s): CHOL, HDL, LDLCALC, TRIG, CHOLHDL, LDLDIRECT in the last 72 hours. Thyroid Function Tests:  Recent Labs  01/23/17 1446  TSH 0.898   Anemia Panel:  Recent Labs  01/23/17 1447  RETICCTPCT 1.82*   Urine analysis:    Component Value Date/Time   COLORURINE AMBER (A) 01/23/2017 2106   APPEARANCEUR HAZY (A) 01/23/2017 2106   LABSPEC 1.021 01/23/2017 2106   LABSPEC 1.030 01/23/2017 1448   PHURINE 5.0 01/23/2017 2106   GLUCOSEU >=500 (A) 01/23/2017 2106   GLUCOSEU 500 01/23/2017 1448   HGBUR MODERATE (A) 01/23/2017 2106   BILIRUBINUR NEGATIVE 01/23/2017 2106   BILIRUBINUR Negative 01/23/2017 1448   KETONESUR 80 (A) 01/23/2017 2106   PROTEINUR 100 (A) 01/23/2017 2106   UROBILINOGEN 0.2 01/23/2017 1448   NITRITE POSITIVE (A) 01/23/2017 2106   LEUKOCYTESUR NEGATIVE 01/23/2017 2106   LEUKOCYTESUR Negative 01/23/2017 1448     Ripudeep Rai M.D. [facility] 01/24/2017, 10:37 AM  Pager: [phone] Between 7am to 7pm - call Pager - [phone]  After 7pm go to [email or web address] - password [email or web address]  Call night coverage person covering after 7pm

## 2017-01-24 NOTE — ED Notes (Signed)
Admitting advises that she will put in some home medications and pt is NPO with sips with meds only

## 2017-01-24 NOTE — ED Notes (Signed)
Attempted report 

## 2017-01-24 NOTE — ED Notes (Signed)
Paged MD about 4th CBG under 180

## 2017-01-24 NOTE — Telephone Encounter (Signed)
The patient was seen in the [facility]. 

## 2017-01-24 NOTE — Telephone Encounter (Signed)
FYI Returned call. "Please Let Dr. Alen Blew know Gregory [name] has new diabetes diagnosis on top of everything else.  Admitted last night with Diabetic Ketoacidosis.  Calling to reschedule today's appointments."  (This nurse cancelled today's chemotherapy at this time.)   Requested [facility] upon discharge.  [facility] appointments will be reschedule upon notification of discharge.  No further questions or needs at this time.  Wished him quick recovery.   Currently scheduled 02-07-2017 for Lab/Opdivo/Nutritionist.  Next MD F/U 02-21-2017. Routing call information to collaborative nurse and provider for review.  Further patient communication through collaborative nurse.Routing call information to collaborative nurse and provider for review.  Further patient communication through collaborative nurse.

## 2017-01-24 NOTE — Progress Notes (Addendum)
[facility] Progress Note   Gregory [name] [id number] [birth year] 56 y.o. 01/23/2017 1600  Gregory [name] is managed by Dr. Zola Button  Actively treated with chemotherapy: yes  Current Therapy: Nivolumab   Last Treated: 01/13/2017  Recent Review Flowsheet Data    Oncology Flowsheet dexamethasone (DECADRON) IJ ferumoxytol (FERAHEME) IV nivolumab (OPDIVO) IV ondansetron (ZOFRAN) IV scopolamine 1 MG/3DAYS (TRANSDERM-SCOP) TD   11/11/2014 10 mg - - 4 mg 1 patch   01/03/2016 - 510 mg - - -   06/07/2016 - - 240 mg - -   06/21/2016 - - 240 mg - -   07/05/2016 - - 240 mg - -   07/19/2016 - - 240 mg - -   08/02/2016 - - 240 mg - -   08/16/2016 - - 240 mg - -   09/06/2016 - - 240 mg - -   09/20/2016 - - 240 mg - -   10/04/2016 - - 240 mg - -   10/18/2016 - - 240 mg - -   11/15/2016 - - 240 mg - -   11/29/2016 - - 240 mg - -   12/13/2016 - - 240 mg - -   12/27/2016 - - 240 mg - -   01/13/2017 - - 240 mg - -     View Complete Flowsheet      chemotherapy flow sheet  Assessment/Plan:   Type 2 diabetes mellitus without complication, without long-term current use of insulin (HCC) - Plan: Insulin Glargine (LANTUS) 100 UNIT/ML Solostar Pen, Insulin Pen Needle 32G X 5 MM MISC, Insulin Syringe-Needle U-100 (INSULIN SYRINGE 1CC/31GX5/16") 31G X 5/16" 1 ML MISC, blood glucose meter kit and supplies KIT, DISCONTINUED: insulin regular (HUMULIN R) 100 units/mL injection  The patient was instructed to push fluids and to follow-up with his PCP in the morning. He will be asked to return in one week for repeat labs  Possible evolving UTI A urine culture is pending. Patient was instructed to continue Cipro and Azo.  Please see After Visit Summary for patient specific instructions.  Future Appointments Date Time Provider [place]  02/07/2017 8:00 AM [facility] ONLY [facility] None  02/07/2017 9:00 AM [facility] A2 [facility] None  02/07/2017 9:00 AM Karie Mainland, RD [facility] None    02/17/2017 8:00 AM [name], MD [facility]  02/21/2017 8:45 AM [facility] LAB 3 [facility] None  02/21/2017 9:15 AM Wyatt Portela, MD [facility] None  02/21/2017 10:15 AM [facility] H29 [facility] None  03/05/2017 11:20 AM Bensimhon, Shaune Pascal, MD [facility] None  03/07/2017 8:00 AM [facility] LAB 4 [facility] None  03/07/2017 9:00 AM [facility] A2 [facility] None  08/08/2017 8:45 AM Hayden Pedro, MD [facility] None    No orders of the defined types were placed in this encounter.      Subjective:   Patient ID:  Gregory [name] is a 56 y.o. (DOB 05/29/1960) male.  Chief Complaint:  Chief Complaint  Patient presents with  . Dysuria    HPI Gregory [name] presents to the office today with a compliant of urgency, frequency, dysuria, 1 episode of vomiting, constipation 3 days, headache, and blurring of his vision. The patient contacted this office yesterday and had thought that he was developing a UTI. He was sent a prescription for Cipro and began over-the-counter Azo. He has had 3 doses of each without improvement in his symptoms. He reports his appetite has been decreased for the past couple of weeks and that  he has had changes in the taste of food. He has also had a 20 pound weight loss over the last 10 days and a 22 pound weight loss since 12/27/2016. He reports having been seen recently by his primary care provider who told him that his blood sugar is elevated and that he could be developing diabetes. Review of his labs show that he had a blood glucose of 284 on a visit with his primary care provider on 01/13/2017 and a blood glucose of 134 at his last visit here 4 weeks ago at this office.  Medications: I have reviewed the patient's current medications.  Allergies: No Known Allergies  Past Medical History:  Diagnosis Date  . Allergy   . Anxiety   . History of gastrointestinal hemorrhage   . Hypertension   . Hypertriglyceridemia   . Macular degeneration    lasar  surgery- Dr Zigmund Daniel  . Plantar warts   . PONV (postoperative nausea and vomiting)     Past Surgical History:  Procedure Laterality Date  . CARDIAC CATHETERIZATION N/A 04/19/2016   Procedure: Left Heart Cath and Coronary Angiography;  Surgeon: Jolaine Artist, MD;  Location: [facility];  Service: Cardiovascular;  Laterality: N/A;  . COLONOSCOPY  03/22/2005  . COLOSTOMY    . EYE SURGERY Right    laser surgery, right eye macular degeneration  . orif fx fibula    . sebaceous cyst excision  1985  . TONSILLECTOMY    . TOTAL SHOULDER ARTHROPLASTY Left 11/11/2014   Procedure: LEFT TOTAL SHOULDER ARTHROPLASTY;  Surgeon: Netta Cedars, MD;  Location: [facility];  Service: Orthopedics;  Laterality: Left;  Marland Kitchen VASECTOMY      '@BIMFAMHISTORY' @  Social History   Social History  . Marital status: Married    Spouse name: N/A  . Number of children: N/A  . Years of education: N/A   Occupational History  . Not on file.   Social History Main Topics  . Smoking status: Never Smoker  . Smokeless tobacco: Never Used  . Alcohol use No     Comment: social  . Drug use: No  . Sexual activity: Not on file   Other Topics Concern  . 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     Past Medical History, Surgical history, Social history, and Family history were reviewed and updated as appropriate.   Please see review of systems for further details on the patient's review from today.   Review of Systems:  Review of Systems  Constitutional: Positive for appetite change and unexpected weight change. Negative for chills, diaphoresis and fever.  Eyes: Positive for visual disturbance.  Respiratory: Negative for shortness of breath.   Cardiovascular: Negative for chest pain, palpitations and leg swelling.  Gastrointestinal:  Positive for constipation and vomiting.  Genitourinary: Positive for dysuria, frequency and urgency.    Objective:   Physical Exam:  BP (!) 154/81 (BP Location: Left Arm, Patient Position: Sitting) Comment: nurse aware  Pulse (!) 103   Temp 97.9 F (36.6 C) (Oral)   Resp 18   Ht '6\' 2"'  (1.88 m)   Wt 228 lb 14.4 oz (103.8 kg)   SpO2 100%   BMI 29.39 kg/m  ECOG: 0  Physical Exam  Constitutional: No distress.  HENT:  Head: Normocephalic and atraumatic.  Mouth/Throat: Oropharynx is clear and moist. No oropharyngeal exudate.  Cardiovascular: Normal rate, regular rhythm and normal heart sounds.  Exam reveals no gallop and no friction rub.   No murmur heard. Pulmonary/Chest: Effort normal and breath sounds normal. No respiratory distress. He has no wheezes. He has no rales.  Abdominal: Soft. He exhibits no distension. There is no tenderness. There is no rebound and no guarding.  Bowel sounds are markedly decreased  Musculoskeletal: He exhibits no edema.  Neurological: He is alert. Coordination normal.  Skin: Skin is warm and dry. No rash noted. He is not diaphoretic. No erythema.  Psychiatric: He has a normal mood and affect. His behavior is normal. Judgment and thought content normal.    Lab Review:     Component Value Date/Time   NA 133 (L) 01/24/2017 0419   NA 131 (L) 01/23/2017 1448   K 4.1 01/24/2017 0419   K 5.2 (H) 01/23/2017 1448   CL 109 01/24/2017 0419   CO2 13 (L) 01/24/2017 0419   CO2 11 (L) 01/23/2017 1448   GLUCOSE 315 (H) 01/24/2017 0419   GLUCOSE 527 (H) 01/23/2017 1448   GLUCOSE 98 05/23/2006 0733   BUN 27 (H) 01/24/2017 0419   BUN 26.8 (H) 01/23/2017 1448   CREATININE 2.05 (H) 01/24/2017 0419   CREATININE 2.4 (H) 01/23/2017 1448   CALCIUM 8.2 (L) 01/24/2017 0419   CALCIUM 9.7 01/23/2017 1448   PROT 8.5 (H) 01/23/2017 1448   ALBUMIN 4.3 01/23/2017 1448   AST 15 01/23/2017 1448   ALT 17 01/23/2017 1448   ALKPHOS 92 01/23/2017 1448   BILITOT 0.54  01/23/2017 1448   GFRNONAA 34 (L) 01/24/2017 0419   GFRAA 40 (L) 01/24/2017 0419       Component Value Date/Time   WBC 11.2 (H) 01/23/2017 2106   RBC 4.81 01/23/2017 2106   HGB 16.4 01/23/2017 2106   HGB 16.5 01/23/2017 1447   HCT 46.3 01/23/2017 2106   HCT 47.1 01/23/2017 1447   PLT 290 01/23/2017 2106   PLT 309 01/23/2017 1447   MCV 96.3 01/23/2017 2106   MCV 97.3 01/23/2017 1447   MCH 34.1 (H) 01/23/2017 2106   MCHC 35.4 01/23/2017 2106   RDW 15.2 01/23/2017 2106   RDW 15.3 (H) 01/23/2017 1447   LYMPHSABS 1.0 01/23/2017 1447   MONOABS 0.6 01/23/2017 1447   EOSABS 0.0 01/23/2017 1447   BASOSABS 0.0 01/23/2017 1447   -------------------------------  Imaging from last 24 hours (if applicable):  Radiology interpretation: No results found.      This patient was seen with Dr. Burr Medico with my treatment plan reviewed with her. She expressed agreement with my medical management of this patient.  Attending addendum I have seen the patient, examined him. I agree with the assessment and and plan and have edited the notes.   56 yo male with PMH of metastatic RCC on Nivolumab, presented with urinary frequency, dysuria, constipation and vomiting, and blurry vision for the past few days. Lab revealed significant hyperglycemia, which is new (BG 134 one month ago, 284 two weeks ago and 527 today) and elevated Cr (baseline 1.4, now 2.4). I recommended hospital admission for management of newly onset diabetes and AKI. NP Lucianne Lei gave pt diabetic education, insulin injection teaching, and suggested management at home with prescription of lantus 10u HS, increase fluids intake, and see his PCP tomorrow. Pt agreed with the plan. He knows to return to office or go to ED if his symptoms  get worse.   Truitt Merle  01/23/2017

## 2017-01-24 NOTE — ED Notes (Signed)
Diabetes coordinator at bedside. 

## 2017-01-24 NOTE — ED Notes (Signed)
Checked CBG 459, RN Mario informed

## 2017-01-24 NOTE — Progress Notes (Signed)
Inpatient Diabetes Program Recommendations  AACE/ADA: New Consensus Statement on Inpatient Glycemic Control (2015)  Target Ranges:  Prepandial:   less than 140 mg/dL      Peak postprandial:   less than 180 mg/dL (1-2 hours)      Critically ill patients:  140 - 180 mg/dL    Admit with: DKA/ New Diagnosis of DM  History: Renal Cell Carcinoma  Home DM Meds: Lantus 10 units QHS                              Regular Insulin 5 units TID with meals                              (just given Rxs for these 2 insulins yesterday 08/30)  Current Insulin Orders: IV Insulin drip     Note patient with new diagnosis of Diabetes.  Saw his PCP on 01/13/17.  Was given Rx for Lantus and Regular Insulins yesterday at [place] office.  Transitioning to Lantus and Novolog today.  Unsure if patient is presenting more like a Type 1 or Type 2 diabetes??  Patient stated he has been taking Immunosuppressive therapy (Nivolumab) for his cancer.  Per review of Up-To-Date in EPIC, this cancer drug can (in rare instances) cause DKA and the development of Type 1 DM.  Have strongly encouraged pt to seek care under an Endocrinologist (patient stated that he may like to see Dr. Delrae Rend with [facility]) after discharge.  Spoke with Dr. Tana Coast about this patient's case.  Discussed the above information with Dr. Tana Coast.  Dr. Tana Coast asked me to place orders for a C-peptide level and the GAD antibody lab draw as well.  Spoke with pt about new diagnosis.  Discussed A1C results with him and explained what an A1C is, basic pathophysiology of DM, basic home care, basic diabetes diet nutrition principles, importance of checking CBGs and maintaining good CBG control to prevent long-term and short-term complications.  Reviewed signs and symptoms of hyperglycemia and hypoglycemia and how to treat hyperglycemia and hypoglycemia at home.  Also reviewed blood sugar goals and A1c goals for home.  Explained to pt that I am  unsure if he is presenting more like a person with Type 1 DM or more like Type 2 diabetes.  Explained the differences in the two types and explained to pt that we will start him on SQ insulin (Lantus and Novolog) to transition off the IV Insulin drip.  Also explained to pt that he may likely need insulin at time of d/c.  Patient stated that he and his wife were given instructions on how to draw up and give insulin prior to leaving his Oncology office.  Explained to pt that the nurses here in the hospital will make sure he and his wife are comfortable giving insulin and checking CBGs prior to d/c.  Also discussed with patient diagnosis of DKA (pathophysiology), treatment of DKA, lab results, and transition plan to SQ insulin regimen.  RNs to provide ongoing basic DM education at bedside with this patient.  Have ordered educational booklet, insulin starter kit, and DM videos.  Have also placed RD consult for DM diet education for this patient.      --Will follow patient during hospitalization--  Wyn Quaker RN, MSN, CDE Diabetes Coordinator Inpatient Glycemic Control Team Team Pager: [phone] (8a-5p)

## 2017-01-24 NOTE — ED Notes (Signed)
Admitting paged about pts request for daily medications

## 2017-01-25 LAB — BASIC METABOLIC PANEL
ANION GAP: 11 (ref 5–15)
BUN: 15 mg/dL (ref 6–20)
CALCIUM: 8.1 mg/dL — AB (ref 8.9–10.3)
CO2: 16 mmol/L — AB (ref 22–32)
Chloride: 109 mmol/L (ref 101–111)
Creatinine, Ser: 1.58 mg/dL — ABNORMAL HIGH (ref 0.61–1.24)
GFR, EST AFRICAN AMERICAN: 54 mL/min — AB (ref >60)
GFR, EST NON AFRICAN AMERICAN: 47 mL/min — AB (ref >60)
Glucose, Bld: 373 mg/dL — ABNORMAL HIGH (ref 65–99)
POTASSIUM: 4.3 mmol/L (ref 3.5–5.1)
Sodium: 136 mmol/L (ref 135–145)

## 2017-01-25 LAB — URINE CULTURE: CULTURE: NO GROWTH

## 2017-01-25 LAB — CBC
HEMATOCRIT: 36.6 % — AB (ref 39.0–52.0)
HEMOGLOBIN: 12.9 g/dL — AB (ref 13.0–17.0)
MCH: 32.9 pg (ref 26.0–34.0)
MCHC: 35.2 g/dL (ref 30.0–36.0)
MCV: 93.4 fL (ref 78.0–100.0)
Platelets: 181 10*3/uL (ref 150–400)
RBC: 3.92 MIL/uL — AB (ref 4.22–5.81)
RDW: 15.7 % — ABNORMAL HIGH (ref 11.5–15.5)
WBC: 4.8 10*3/uL (ref 4.0–10.5)

## 2017-01-25 LAB — GLUCOSE, CAPILLARY
GLUCOSE-CAPILLARY: 361 mg/dL — AB (ref 65–99)
GLUCOSE-CAPILLARY: 341 mg/dL — AB (ref 65–99)
GLUCOSE-CAPILLARY: 233 mg/dL — AB (ref 65–99)
GLUCOSE-CAPILLARY: 219 mg/dL — AB (ref 65–99)
Glucose-Capillary: 351 mg/dL — ABNORMAL HIGH (ref 65–99)

## 2017-01-25 LAB — C-PEPTIDE: C PEPTIDE: 1 ng/mL — AB (ref 1.1–4.4)

## 2017-01-25 MED ORDER — INSULIN GLARGINE 100 UNIT/ML ~~LOC~~ SOLN: 15.0000 [IU] | Freq: Every day | SUBCUTANEOUS | Status: DC

## 2017-01-25 MED ORDER — SODIUM CHLORIDE 0.9 % IV SOLN
INTRAVENOUS | Status: DC
  Administered 2017-01-25: 07:00:00 via INTRAVENOUS

## 2017-01-25 MED ORDER — INSULIN ASPART 100 UNIT/ML ~~LOC~~ SOLN
4.0000 [IU] | Freq: Three times a day (TID) | SUBCUTANEOUS | Status: DC
  Administered 2017-01-25 (×3): 4 [IU] via SUBCUTANEOUS

## 2017-01-25 MED ORDER — LEVOTHYROXINE SODIUM 25 MCG PO TABS
25.0000 ug | ORAL_TABLET | Freq: Every day | ORAL | Status: DC
  Administered 2017-01-25 – 2017-01-26 (×2): 25 ug via ORAL
  Filled 2017-01-25 (×2): qty 1

## 2017-01-25 MED ORDER — INSULIN GLARGINE 100 UNIT/ML ~~LOC~~ SOLN
15.0000 [IU] | Freq: Every day | SUBCUTANEOUS | Status: DC
  Administered 2017-01-25: 15 [IU] via SUBCUTANEOUS
  Filled 2017-01-25 (×2): qty 0.15

## 2017-01-25 NOTE — Progress Notes (Signed)
[facility]                                                                              Patient Demographics  Gregory [name], is a 56 y.o. male, DOB - 12-10-1960, OZH:[id number]  Admit date - 01/23/2017   Admitting Physician Rise Patience, MD  Outpatient Primary MD for the patient is [name], MD  Outpatient specialists:   LOS - 2  days   Medical records reviewed and are as summarized below:    Chief Complaint  Patient presents with  . Hyperglycemia       Brief summary   Gregory [name] is a 56 y.o. male with history of metastatic renal cell carcinoma status post nephrectomy, hypertension, nonischemic cardiomyopathy and hypothyroidism presented with persistent nausea and vomiting and weakness for last 2 days. Patient had followed up with his oncologist and was found to have elevated blood sugar and was placed on Lantus on the day of admission. Labs in the ER shows elevated gap with elevated blood sugar consistent with diabetic ketoacidosis.   Assessment & Plan    Principal Problem:   DKA (diabetic ketoacidosis) ([place]) with new onset diabetes type 2 - Patient presented with hyperkalemia, hyponatremia, acute kidney injury with creatinine of 2.47, hyperglycemia, anion gap of 21, bicarbonate 9 - surprisingly his A1c was 5.8 on 8/20, A1c now increased to 7.5 - the patient has been working out and eating poorly in the last 1 month to lose weight, and on immunosuppressive therapy (nivolumab) in risk instances cause DKA and type 1 diabetes - fasting blood sugar 373 this morning, placed on NovoLog 4 units with meals, increase the Lantus to 15 units. - insulin teaching with flex pens - follow C-peptide, GAD ab's   Active Problems: Acute kidney injury: Due to DKA and profound dehydration - presented with creatinine of 2.47 - creatinine improved to 1.5,patient feels somewhat bloated, I's and O's were 3.9 L positive, will DC IV fluids  Hyperkalemia - patient  presented with potassium of 5.6 due to profound DKA and dehydration, meds  - potassium improved  Hyponatremia - patient presented with sodium of 129, pseudohyponatremia with blood glucose of 548, corrected sodium was 136 - resolved, DC IV fluid hydration     Primary malignant neoplasm of kidney with metastasis from kidney to other site [facility]) - status post nephrectomy, follows Dr. Alen Blew and MD Ouida Sills    Essential hypertension - currently BP soft - continue to hold Coreg, entresto - patient states that he stopped losartan when he started entresto    Chronic systolic CHF (congestive heart failure) ([place]) -  Had profound dehydration and hypovolemia at the time of admission - hold entresto  Hypothyroidism - will continue Synthroid, TSH on 8/30 was 0.89   Code Status: full  DVT Prophylaxis:  Lovenox  Family Communication: Discussed in detail with the patient, all imaging results, lab results explained to the patient   Disposition Plan: possibly tomorrow  Time Spent in minutes  35 minutes  Procedures:  None   Consultants:   None   Antimicrobials:      Medications  Scheduled Meds: .  enoxaparin (LOVENOX) injection  40 mg Subcutaneous Daily  . insulin aspart  0-5 Units Subcutaneous QHS  . insulin aspart  0-9 Units Subcutaneous TID WC  . insulin aspart  4 Units Subcutaneous TID WC  . insulin glargine  15 Units Subcutaneous Daily  . levothyroxine  25 mcg Oral QAC breakfast  . loratadine  10 mg Oral Daily  . PARoxetine  25 mg Oral Daily   Continuous Infusions:  PRN Meds:.hydrALAZINE   Antibiotics   Anti-infectives    None        Subjective:   Gregory [name] was seen and examined today. Still feels weak. Blood sugars still high.  Patient denies dizziness, chest pain, shortness of breath, abdominal pain, N/V/D/C.   Objective:   Vitals:   01/24/17 1722 01/24/17 2021 01/25/17 0000 01/25/17 0400  BP: 118/64 (!) 113/58 128/85 116/62  Pulse: 69 76 87 82    Resp: 16 16 17 17   Temp: 98.3 F (36.8 C) 98 F (36.7 C) 97.7 F (36.5 C) 97.8 F (36.6 C)  TempSrc: Oral Oral Oral Oral  SpO2: 99% 99% 99% 99%  Weight: 112.8 kg (248 lb 9.6 oz)     Height: 6\' 2"  (1.88 m)       Intake/Output Summary (Last 24 hours) at 01/25/17 1312 Last data filed at 01/25/17 1056  Gross per 24 hour  Intake             1450 ml  Output              200 ml  Net             1250 ml     Wt Readings from Last 3 Encounters:  01/24/17 112.8 kg (248 lb 9.6 oz)  01/23/17 103.8 kg (228 lb 14.4 oz)  01/13/17 112.5 kg (248 lb)     Exam   General: Alert and oriented x 3, NAD  Eyes:   HEENT:    Cardiovascular: S1 S2 auscultated, no rubs, murmurs or gallops. RRR No pedal edema b/l  Respiratory: Clear to auscultation bilaterally, no wheezing, rales or rhonchi  Gastrointestinal: Soft, nontender, nondistended, + bowel sounds  Ext: no pedal edema bilaterally  Neuro: no new deficits  Musculoskeletal: No digital cyanosis, clubbing  Skin: No rashes  Psych: Normal affect and demeanor, alert and oriented x3    Data Reviewed:  I have personally reviewed following labs and imaging studies  Micro Results Recent Results (from the past 240 hour(s))  Urine Culture     Status: None   Collection Time: 01/23/17  2:48 PM  Result Value Ref Range Status   Urine Culture, Routine Final report  Final   Organism ID, Bacteria No growth  Final  Urine Culture     Status: None   Collection Time: 01/24/17 12:20 PM  Result Value Ref Range Status   Specimen Description URINE, RANDOM  Final   Special Requests NONE  Final   Culture NO GROWTH  Final   Report Status 01/25/2017 FINAL  Final    Radiology Reports No results found.  Lab Data:  CBC:  Recent Labs Lab 01/23/17 1447 01/23/17 2106 01/25/17 0625  WBC 10.3 11.2* 4.8  NEUTROABS 8.6*  --   --   HGB 16.5 16.4 12.9*  HCT 47.1 46.3 36.6*  MCV 97.3 96.3 93.4  PLT 309 290 676   Basic Metabolic  Panel:  Recent Labs Lab 01/24/17 0212 01/24/17 0419 01/24/17 1142 01/24/17 1809 01/25/17 0625  NA 133* 133* 136  137 136  K 4.5 4.1 3.6 3.5 4.3  CL 107 109 112* 111 109  CO2 12* 13* 19* 19* 16*  GLUCOSE 414* 315* 141* 137* 373*  BUN 30* 27* 22* 19 15  CREATININE 2.29* 2.05* 1.75* 1.62* 1.58*  CALCIUM 8.4* 8.2* 8.2* 8.3* 8.1*   GFR: Estimated Creatinine Clearance: 68.9 mL/min (A) (by C-G formula based on SCr of 1.58 mg/dL (H)). Liver Function Tests:  Recent Labs Lab 01/23/17 1448  AST 15  ALT 17  ALKPHOS 92  BILITOT 0.54  PROT 8.5*  ALBUMIN 4.3   No results for input(s): LIPASE, AMYLASE in the last 168 hours. No results for input(s): AMMONIA in the last 168 hours. Coagulation Profile: No results for input(s): INR, PROTIME in the last 168 hours. Cardiac Enzymes: No results for input(s): CKTOTAL, CKMB, CKMBINDEX, TROPONINI in the last 168 hours. BNP (last 3 results) No results for input(s): PROBNP in the last 8760 hours. HbA1C:  Recent Labs  01/24/17 1809  HGBA1C 7.5*   CBG:  Recent Labs Lab 01/24/17 1743 01/24/17 2129 01/25/17 0439 01/25/17 0744 01/25/17 1203  GLUCAP 114* 291* 351* 361* 341*   Lipid Profile: No results for input(s): CHOL, HDL, LDLCALC, TRIG, CHOLHDL, LDLDIRECT in the last 72 hours. Thyroid Function Tests:  Recent Labs  01/23/17 1446  TSH 0.898   Anemia Panel:  Recent Labs  01/23/17 1447  RETICCTPCT 1.82*   Urine analysis:    Component Value Date/Time   COLORURINE YELLOW 01/24/2017 1220   APPEARANCEUR CLEAR 01/24/2017 1220   LABSPEC 1.010 01/24/2017 1220   LABSPEC 1.030 01/23/2017 1448   PHURINE 6.0 01/24/2017 1220   GLUCOSEU 150 (A) 01/24/2017 1220   GLUCOSEU 500 01/23/2017 1448   HGBUR SMALL (A) 01/24/2017 1220   BILIRUBINUR NEGATIVE 01/24/2017 1220   BILIRUBINUR Negative 01/23/2017 1448   KETONESUR 20 (A) 01/24/2017 1220   PROTEINUR 30 (A) 01/24/2017 1220   UROBILINOGEN 0.2 01/23/2017 1448   NITRITE NEGATIVE  01/24/2017 1220   LEUKOCYTESUR TRACE (A) 01/24/2017 1220   LEUKOCYTESUR Negative 01/23/2017 1448     Ripudeep Rai M.D. [facility] 01/25/2017, 1:12 PM  Pager: [phone] Between 7am to 7pm - call Pager - [phone]  After 7pm go to [email or web address] - password [email or web address]  Call night coverage person covering after 7pm

## 2017-01-26 LAB — BASIC METABOLIC PANEL
ANION GAP: 2 — AB (ref 5–15)
BUN: 11 mg/dL (ref 6–20)
CALCIUM: 8.6 mg/dL — AB (ref 8.9–10.3)
CO2: 30 mmol/L (ref 22–32)
CREATININE: 1.25 mg/dL — AB (ref 0.61–1.24)
Chloride: 107 mmol/L (ref 101–111)
GFR calc Af Amer: >60 mL/min (ref >60)
GLUCOSE: 216 mg/dL — AB (ref 65–99)
Potassium: 3.2 mmol/L — ABNORMAL LOW (ref 3.5–5.1)
Sodium: 139 mmol/L (ref 135–145)

## 2017-01-26 LAB — GLUCOSE, CAPILLARY
Glucose-Capillary: 242 mg/dL — ABNORMAL HIGH (ref 65–99)
Glucose-Capillary: 195 mg/dL — ABNORMAL HIGH (ref 65–99)

## 2017-01-26 MED ORDER — SACUBITRIL-VALSARTAN 49-51 MG PO TABS: 1.0000 | ORAL_TABLET | Freq: Two times a day (BID) | ORAL | 3 refills | Status: DC

## 2017-01-26 MED ORDER — INSULIN ASPART 100 UNIT/ML FLEXPEN: PEN_INJECTOR | SUBCUTANEOUS | 11 refills | Status: DC

## 2017-01-26 MED ORDER — POTASSIUM CHLORIDE CRYS ER 20 MEQ PO TBCR
40.0000 meq | EXTENDED_RELEASE_TABLET | Freq: Once | ORAL | Status: AC
  Administered 2017-01-26: 40 meq via ORAL
  Filled 2017-01-26: qty 2

## 2017-01-26 MED ORDER — INSULIN GLARGINE 100 UNIT/ML SOLOSTAR PEN
20.0000 [IU] | PEN_INJECTOR | Freq: Every day | SUBCUTANEOUS | 3 refills | Status: DC
Start: 2017-01-26 — End: 2018-07-29

## 2017-01-26 MED ORDER — INSULIN ASPART 100 UNIT/ML FLEXPEN: 5.0000 [IU] | PEN_INJECTOR | Freq: Three times a day (TID) | SUBCUTANEOUS | 11 refills | Status: DC

## 2017-01-26 MED ORDER — INSULIN GLARGINE 100 UNIT/ML ~~LOC~~ SOLN
20.0000 [IU] | Freq: Every day | SUBCUTANEOUS | Status: DC
  Administered 2017-01-26: 20 [IU] via SUBCUTANEOUS
  Filled 2017-01-26: qty 0.2

## 2017-01-26 MED ORDER — INSULIN ASPART 100 UNIT/ML ~~LOC~~ SOLN
5.0000 [IU] | Freq: Three times a day (TID) | SUBCUTANEOUS | Status: DC
  Administered 2017-01-26 (×2): 5 [IU] via SUBCUTANEOUS

## 2017-01-26 NOTE — Progress Notes (Signed)
Inpatient Diabetes Program Recommendations  AACE/ADA: New Consensus Statement on Inpatient Glycemic Control (2015)  Target Ranges:  Prepandial:   less than 140 mg/dL      Peak postprandial:   less than 180 mg/dL (1-2 hours)      Critically ill patients:  140 - 180 mg/dL   Lab Results  Component Value Date   GLUCAP 219 (H) 01/25/2017   HGBA1C 7.5 (H) 01/24/2017    Review of Glycemic Control Results for KIE, CALVIN (MRN [id number]) as of 01/26/2017 07:32  Ref. Range 01/25/2017 07:44 01/25/2017 12:03 01/25/2017 16:58 01/25/2017 22:35  Glucose-Capillary Latest Ref Range: 65 - 99 mg/dL 361 (H) 341 (H) 233 (H) 219 (H)   Results for MANOAH, DECKARD (MRN [id number]) as of 01/26/2017 07:32  Ref. Range 01/26/2017 05:33  Glucose Latest Ref Range: 65 - 99 mg/dL 216 (H)   Inpatient Diabetes Program Recommendations:   Reviewed CBGs. Noted C-Peptide results not available yet. Please consider: -Increase Lantus to 20 -Increase Novolog meal coverage to 5 tid if eats 50%  Thank you, Bethena Roys E. Abra Lingenfelter, RN, MSN, CDE  Diabetes Coordinator Inpatient Glycemic Control Team Team Pager [phone] (8am-5pm) 01/26/2017 7:35 AM

## 2017-01-26 NOTE — Discharge Summary (Signed)
Physician Discharge Summary   Patient ID: Gregory [name] MRN: [id number] DOB/AGE: [birth year] 57 y.o.  Admit date: 01/23/2017 Discharge date: 01/26/2017  Primary Care Physician:  Gregory Lima, MD  Discharge Diagnoses:    . DKA (diabetic ketoacidosis) (Gregory [name]) . ARF (acute renal failure) (Gregory [name]) . 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 Essential hypertension . hyperkalemia . Chronic systolic CHF (congestive heart failure) (HCC)   Consults: none  Recommendations for Outpatient Follow-up:  1. Please repeat CBC/BMET at next visit   DIET: carb modified diet     Allergies:  No Known Allergies   DISCHARGE MEDICATIONS: Current Discharge Medication List    START taking these medications   Details  !! insulin aspart (NOVOLOG FLEXPEN) 100 UNIT/ML FlexPen Inject 5 Units into the skin 3 (three) times daily with meals. Qty: 15 mL, Refills: 11    !! insulin aspart (NOVOLOG FLEXPEN) 100 UNIT/ML FlexPen Add to meal coverage Sliding scale CBG 70 - 120: 0 units CBG 121 - 150: 1 unit,  CBG 151 - 200: 2 units,  CBG 201 - 250: 3 units,  CBG 251 - 300: 5 units,  CBG 301 - 350: 7 units,  CBG 351 - 400: 9 units   CBG > 400: 9 units and notify your MD Qty: 15 mL, Refills: 11     !! - Potential duplicate medications found. Please discuss with provider.    CONTINUE these medications which have CHANGED   Details  Insulin Glargine (LANTUS) 100 UNIT/ML Solostar Pen Inject 20 Units into the skin at bedtime. Qty: 15 mL, Refills: 3   Associated Diagnoses: Type 2 diabetes mellitus without complication, without long-term current use of insulin (HCC)    sacubitril-valsartan (ENTRESTO) 49-51 MG Take 1 tablet by mouth 2 (two) times daily. HOLD until follow-up with your MD Qty: 60 tablet, Refills: 3      CONTINUE these medications which have NOT CHANGED   Details  blood glucose meter kit and supplies KIT Dispense based on patient and insurance preference.  Use up to four times daily as directed. (FOR ICD-9 250.00, 250.01). Qty: 1 each, Refills: 0   Associated Diagnoses: Type 2 diabetes mellitus without complication, without long-term current use of insulin (HCC)    carvedilol (COREG) 3.125 MG tablet TAKE 1 TABLET TWICE DAILY. Qty: 60 tablet, Refills: 6    cetirizine (ZYRTEC) 10 MG tablet Take 10 mg by mouth daily.    Insulin Pen Needle 32G X 5 MM MISC 1 each by Does not apply route at bedtime. For Solostar Pen Qty: 100 each, Refills: 4   Associated Diagnoses: Type 2 diabetes mellitus without complication, without long-term current use of insulin (HCC)    Insulin Syringe-Needle U-100 (INSULIN SYRINGE 1CC/31GX5/16") 31G X 5/16" 1 ML MISC 1 each by Does not apply route 3 (three) times daily before meals. Qty: 100 each, Refills: 6   Associated Diagnoses: Type 2 diabetes mellitus without complication, without long-term current use of insulin (HCC)    levothyroxine (SYNTHROID, LEVOTHROID) 25 MCG tablet Take 25 mcg by mouth daily before breakfast.     PARoxetine (PAXIL-CR) 25 MG 24 hr tablet Take 1 tablet (25 mg total) by mouth daily. Qty: 90 tablet, Refills: 3    phenazopyridine (PYRIDIUM) 95 MG tablet Take 95 mg by mouth 3 (three) times daily as needed for pain.      STOP taking these medications     ciprofloxacin (CIPRO) 500 MG tablet  insulin regular (HUMULIN R) 100 units/mL injection      losartan (COZAAR) 50 MG tablet          Brief H and P: For complete details please refer to admission H and P, but in brief Gregory [name] a 56 y.o.malewith history of metastatic renal cell carcinoma status post nephrectomy, hypertension, nonischemic cardiomyopathy and hypothyroidism presented with persistent nausea and vomiting and weakness for last 2 days. Patient had followed up with his oncologist and was found to have elevated blood sugar and was placed on Lantus on the day of admission.Labs in the ER shows elevated gap with elevated  blood sugar consistent with diabetic ketoacidosis.   [name] Course:   DKA (diabetic ketoacidosis) (Gregory [name]) with new onset diabetes type 2 - Patient presented with hyperkalemia, hyponatremia, acute kidney injury with creatinine of 2.47, hyperglycemia, anion gap of 21, bicarbonate 9 - surprisingly his A1c was 5.8 on 8/20, A1c now increased to 7.5 - the patient has been working out and eating poorly in the last 1 month to lose weight, and on immunosuppressive therapy (nivolumab) in rare instances cause DKA and type 1 diabetes as adverse effect - Lantus increased to 20 units, NovoLog 5 units with meals, explained sliding scale insulin in detail to the patient - insulin teaching with flex pens -C-peptide low 1.0, GAD ab's pending, will be followed by Gregory [name]   Acute kidney injury: Due to DKA and profound dehydration - presented with creatinine of 2.47, received aggressive IV fluid hydration due to profound DKA at the time of admission - creatinine improved to.2 with the time of discharge.  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 Alen [name] and MD Gregory [name]    Essential hypertension - currently BP soft, can resume Coreg if BP improving - continue to hold entresto until PCP follow-up - patient states that he stopped losartan when he started entresto    Chronic systolic CHF (congestive heart failure) (Gregory [name]) -  Had profound dehydration and hypovolemia at the time of admission - hold entresto  Hypothyroidism - will continue Synthroid, TSH on 8/30 was  0.89    Day of Discharge BP 105/76 (BP Location: Left Arm)   Pulse 76   Temp 97.9 F (36.6 C) (Oral)   Resp 17   Ht '6\' 2"'  (1.88 m)   Wt 112.8 kg (248 lb 9.6 oz)   SpO2 98%   BMI 31.92 kg/m   Physical Exam: General: Alert and awake oriented x3 not in any acute distress. HEENT: anicteric sclera, pupils reactive to light and accommodation CVS: S1-S2 clear no murmur rubs or gallops Chest: clear to auscultation bilaterally, no wheezing rales or rhonchi Abdomen: soft nontender, nondistended, normal bowel sounds Extremities: no cyanosis, clubbing or edema noted bilaterally Neuro: Cranial nerves II-XII intact, no focal neurological deficits   The results of significant diagnostics from this hospitalization (including imaging, microbiology, ancillary and laboratory) are listed below for reference.    LAB RESULTS: Basic Metabolic Panel:  Recent Labs Lab 01/25/17 0625 01/26/17 0533  NA 136 139  K 4.3 3.2*  CL 109 107  CO2  16* 30  GLUCOSE 373* 216*  BUN 15 11  CREATININE 1.58* 1.25*  CALCIUM 8.1* 8.6*   Liver Function Tests:  Recent Labs Lab 01/23/17 1448  AST 15  ALT 17  ALKPHOS 92  BILITOT 0.54  PROT 8.5*  ALBUMIN 4.3   No results for input(s): LIPASE, AMYLASE in the last 168 hours. No results for input(s): AMMONIA in the last 168 hours. CBC:  Recent Labs Lab 01/23/17 1447  01/23/17 2106 01/25/17 0625  WBC 10.3  --  11.2* 4.8  NEUTROABS 8.6*  --   --   --   HGB 16.5  --  16.4 12.9*  HCT 47.1  --  46.3 36.6*  MCV 97.3  < > 96.3 93.4  PLT 309  --  290 181  < > = values in this interval not displayed. Cardiac Enzymes: No results for input(s): CKTOTAL, CKMB, CKMBINDEX, TROPONINI in the last 168 hours. BNP: Invalid input(s): POCBNP CBG:  Recent Labs Lab 01/25/17 2235 01/26/17 0742  GLUCAP 219* 242*    Significant Diagnostic Studies:  No results found.  2D ECHO:   Disposition and Follow-up: Discharge Instructions    Diet Carb Modified     Complete by:  As directed    Discharge instructions    Complete by:  As directed    Regimen :  Lantus 20 units at bedtime NovoLog 5 units with meals plus correction factor sliding scale  Add to meal coverage Sliding scale CBG 70 - 120: 0 units CBG 121 - 150: 1 unit,  CBG 151 - 200: 2 units,  CBG 201 - 250: 3 units,  CBG 251 - 300: 5 units,  CBG 301 - 350: 7 units,  CBG 351 - 400: 9 units   CBG > 400: 9 units and notify your MD   It is VERY IMPORTANT that you follow up with a PCP on a regular basis.  Check your blood glucoses before each meal and at bedtime and maintain a log of your readings.  Bring this log with you when you follow up with your PCP or Dr Buddy [name] so that he can adjust your insulin at your follow up visit.   Increase activity slowly    Complete by:  As directed        DISPOSITION:  home   DISCHARGE FOLLOW-UP Follow-up Information    Gregory Lima, MD. Schedule an appointment as soon as possible for a visit in 10 day(s).   Specialty:  Internal Medicine Why:  For [name] follow-up Contact information: [address] [address] [phone]        Delrae Rend, MD. Schedule an appointment as soon as possible for a visit in 2 week(s).   Specialty:  Endocrinology Contact information: [address] [place] [address] [place] [address] [phone]            Time spent on Discharge: 45 minutes  Signed:   Estill Cotta M.D. [facility] 01/26/2017, 11:55 AM Pager: [phone]

## 2017-01-26 NOTE — Consult Note (Signed)
Per Consult form visited with patient and gave them a copy of the AD and explained there was not a notary here today.  Made sure to follow up with any questions and told them not to sign anything until a notary is present.  If still a patient on Tuesday Chaplain should follow up.  Chaplain Katherene Ponto

## 2017-01-26 NOTE — Progress Notes (Signed)
Nsg Discharge Note  Admit Date:  01/23/2017 Discharge date: 01/26/2017   Gregory [name] to be D/C'd Home per MD order.  AVS completed.  Copy for chart, and copy for patient signed, and dated. Patient/caregiver able to verbalize understanding.  Discharge Medication: Allergies as of 01/26/2017   No Known Allergies     Medication List    STOP taking these medications   ciprofloxacin 500 MG tablet Commonly known as:  CIPRO   losartan 50 MG tablet Commonly known as:  COZAAR     TAKE these medications   blood glucose meter kit and supplies Kit Dispense based on patient and insurance preference. Use up to four times daily as directed. (FOR ICD-9 250.00, 250.01).   carvedilol 3.125 MG tablet Commonly known as:  COREG TAKE 1 TABLET TWICE DAILY.   cetirizine 10 MG tablet Commonly known as:  ZYRTEC Take 10 mg by mouth daily.   insulin aspart 100 UNIT/ML FlexPen Commonly known as:  NOVOLOG FLEXPEN Inject 5 Units into the skin 3 (three) times daily with meals.   insulin aspart 100 UNIT/ML FlexPen Commonly known as:  NOVOLOG FLEXPEN Add to meal coverage Sliding scale CBG 70 - 120: 0 units CBG 121 - 150: 1 unit,  CBG 151 - 200: 2 units,  CBG 201 - 250: 3 units,  CBG 251 - 300: 5 units,  CBG 301 - 350: 7 units,  CBG 351 - 400: 9 units   CBG > 400: 9 units and notify your MD   Insulin Glargine 100 UNIT/ML Solostar Pen Commonly known as:  LANTUS Inject 20 Units into the skin at bedtime. What changed:  how much to take   Insulin Pen Needle 32G X 5 MM Misc 1 each by Does not apply route at bedtime. For Solostar Pen   INSULIN SYRINGE 1CC/31GX5/16" 31G X 5/16" 1 ML Misc 1 each by Does not apply route 3 (three) times daily before meals.   levothyroxine 25 MCG tablet Commonly known as:  SYNTHROID, LEVOTHROID Take 25 mcg by mouth daily before breakfast.   PARoxetine 25 MG 24 hr tablet Commonly known as:  PAXIL-CR Take 1 tablet (25 mg total) by mouth daily.   phenazopyridine 95 MG  tablet Commonly known as:  PYRIDIUM Take 95 mg by mouth 3 (three) times daily as needed for pain.   sacubitril-valsartan 49-51 MG Commonly known as:  ENTRESTO Take 1 tablet by mouth 2 (two) times daily. HOLD until follow-up with your MD What changed:  additional instructions            Discharge Care Instructions        Start     Ordered   01/26/17 0000  Insulin Glargine (LANTUS) 100 UNIT/ML Solostar Pen  Daily at bedtime     01/26/17 1037   01/26/17 0000  sacubitril-valsartan (ENTRESTO) 49-51 MG  2 times daily    Comments:  D/C order for Losartan   01/26/17 1037   01/26/17 0000  insulin aspart (NOVOLOG FLEXPEN) 100 UNIT/ML FlexPen  3 times daily with meals     01/26/17 1037   01/26/17 0000  insulin aspart (NOVOLOG FLEXPEN) 100 UNIT/ML FlexPen     01/26/17 1037   01/26/17 0000  Increase activity slowly     01/26/17 1037   01/26/17 0000  Diet Carb Modified     01/26/17 1037   01/26/17 0000  Discharge instructions    Comments:  Regimen :  Lantus 20 units at bedtime NovoLog 5 units with meals  plus correction factor sliding scale  Add to meal coverage Sliding scale CBG 70 - 120: 0 units CBG 121 - 150: 1 unit,  CBG 151 - 200: 2 units,  CBG 201 - 250: 3 units,  CBG 251 - 300: 5 units,  CBG 301 - 350: 7 units,  CBG 351 - 400: 9 units   CBG > 400: 9 units and notify your MD   It is VERY IMPORTANT that you follow up with a PCP on a regular basis.  Check your blood glucoses before each meal and at bedtime and maintain a log of your readings.  Bring this log with you when you follow up with your PCP or Dr Buddy Duty so that he can adjust your insulin at your follow up visit.   Continue to hold Entresto. You can resume Coreg if BP is better. Hold Coreg if running low in the 90s.   01/26/17 1205      Discharge Assessment: Vitals:   01/25/17 2237 01/26/17 0602  BP: (!) 145/93 105/76  Pulse: 74 76  Resp: 17 17  Temp: 98.1 F (36.7 C) 97.9 F (36.6 C)  SpO2: 97% 98%   Skin clean,  dry and intact without evidence of skin break down, no evidence of skin tears noted. IV catheter discontinued intact. Site without signs and symptoms of complications - no redness or edema noted at insertion site, patient denies c/o pain - only slight tenderness at site.  Dressing with slight pressure applied.  D/c Instructions-Education: Discharge instructions given to patient/family with verbalized understanding. D/c education completed with patient/family including follow up instructions, medication list, d/c activities limitations if indicated, with other d/c instructions as indicated by MD - patient able to verbalize understanding, all questions fully answered. Patient instructed to return to ED, call 911, or call MD for any changes in condition.  Patient escorted via [place], and D/C home via private auto.  Eliot Ford, RN 01/26/2017 2:04 PM

## 2017-01-27 ENCOUNTER — Encounter: Payer: Self-pay | Admitting: Medical

## 2017-01-28 ENCOUNTER — Telehealth (HOSPITAL_COMMUNITY): Payer: Self-pay | Admitting: *Deleted

## 2017-01-28 LAB — GLUTAMIC ACID DECARBOXYLASE AUTO ABS: Glutamic Acid Decarb Ab: <5 U/mL (ref 0.0–5.0)

## 2017-01-28 NOTE — Telephone Encounter (Signed)
Patient called stating that he was admitted over the weekend for DKA, entresto was held and he is asking if he needs to restart it.  Gregory Booze, NP reviewed patient's labs and said it was ok restart it. Patient aware and will restart it today.

## 2017-01-29 ENCOUNTER — Telehealth: Payer: Self-pay | Admitting: Oncology

## 2017-01-29 DIAGNOSIS — E109 Type 1 diabetes mellitus without complications: Secondary | ICD-10-CM | POA: Diagnosis not present

## 2017-01-29 DIAGNOSIS — Z794 Long term (current) use of insulin: Secondary | ICD-10-CM | POA: Diagnosis not present

## 2017-01-29 DIAGNOSIS — E039 Hypothyroidism, unspecified: Secondary | ICD-10-CM | POA: Diagnosis not present

## 2017-01-29 NOTE — Telephone Encounter (Signed)
sw pt to confirm 9/7 lab/MD appt at 0845 per sch msg. Infusion not sched yet to to capped

## 2017-01-30 ENCOUNTER — Ambulatory Visit (INDEPENDENT_AMBULATORY_CARE_PROVIDER_SITE_OTHER): Payer: BLUE CROSS/BLUE SHIELD | Admitting: Internal Medicine

## 2017-01-30 ENCOUNTER — Encounter: Payer: Self-pay | Admitting: Internal Medicine

## 2017-01-30 VITALS — BP 120/70 | HR 81 | Temp 98.1°F | Resp 16 | Ht 74.0 in | Wt 236.0 lb

## 2017-01-30 DIAGNOSIS — Z794 Long term (current) use of insulin: Secondary | ICD-10-CM

## 2017-01-30 DIAGNOSIS — E119 Type 2 diabetes mellitus without complications: Secondary | ICD-10-CM

## 2017-01-30 DIAGNOSIS — IMO0001 Reserved for inherently not codable concepts without codable children: Secondary | ICD-10-CM

## 2017-01-30 MED ORDER — INSULIN ASPART 100 UNIT/ML FLEXPEN: 9.0000 [IU] | PEN_INJECTOR | Freq: Three times a day (TID) | SUBCUTANEOUS | 11 refills | Status: DC

## 2017-01-30 NOTE — Patient Instructions (Signed)
Type 1 Diabetes Mellitus, Diagnosis, Adult Type 1 diabetes (type 1 diabetes mellitus) is a long-term (chronic) disease. It occurs when the pancreas does not make enough of a hormone called insulin. Normally, insulin allows sugars (glucose) to enter cells in the body. The cells use glucose for energy. Lack of insulin causes excess glucose to build up in the blood instead of going into cells. As a result, high blood glucose (hyperglycemia) develops. The exact cause of type 1 diabetes is not known. There is currently no cure for type 1 diabetes, but it can be managed with insulin treatment and lifestyle changes. What increases the risk? You may be more likely to develop this condition if you have a family member who has type 1 diabetes. Other factors may also make you more likely to develop type 1 diabetes, such as:  Having a gene for type 1 diabetes that is passed along from parent to child (inherited).  Living in an area with cold weather conditions.  Exposure to certain viruses.  Certain conditions in which the body's disease-fighting (immune) system attacks itself (autoimmune disorders).  What are the signs or symptoms? Symptoms may develop gradually, over days or weeks, or they may develop suddenly. Symptoms may include:  Increased thirst (polydipsia).  Increased hunger(polyphagia).  Increased urination (polyuria).  Increased urination during the night (nocturia).  Sudden or unexplained weight changes.  Frequent infections that keep coming back (recurring).  Fatigue.  Weakness.  Vision changes, such as blurry vision.  Fruity-smelling breath.  Cuts or bruises that are slow to heal.  Tingling or numbness in the hands or feet.  How is this diagnosed?  This condition is diagnosed based on your symptoms, your medical history, a physical exam, and your blood glucose level. Your blood glucose may be checked with one or more of the following blood tests:  A fasting blood  glucose (FBG) test. You will not be allowed to eat (you will fast) for at least 8 hours before a blood sample is taken.  A random blood glucose test. This checks blood glucose at any time of day regardless of when you ate.  An A1c (hemoglobin A1c) blood test. This provides information about blood glucose control over the previous 2-3 months.  You may be diagnosed with type 1 diabetes if:  Your FBG level is 126 mg/dL (7.0 mmol/L) or higher.  Your random blood glucose level is 200 mg/dL (11.1 mmol/L) or higher.  Your A1c level is 6.5% or higher.  These blood tests may be repeated to confirm your diagnosis. How is this treated? Your treatment may be managed by a specialist called an endocrinologist. Type 1 diabetes can be managed by following instructions from your health care provider about:  Taking insulin daily. This helps to keep your blood glucose levels in the healthy range. ? You may need to adjust your insulin dosage based on how physically active you are and what foods you eat. Your health care provider will tell you how to do this.  Taking medicines to help prevent complications from diabetes, such as: ? Aspirin. ? Medicine to lower cholesterol. ? Medicine to control blood pressure.  Checking your blood glucose as often as directed.  Making diet and lifestyle changes. These may include: ? Following an individualized nutrition plan that is developed by a diet and nutrition specialist (registered dietitian). ? Exercising regularly. ? Finding ways to manage stress.  Your health care provider will set individualized treatment goals for you. Your goals will be based on   your age, other medical conditions you have, and how you respond to diabetes treatment. Generally, the goal of treatment is to maintain the following blood glucose levels:  Before meals (preprandial): 80-130 mg/dL (4.4-7.2 mmol/L).  After meals (postprandial): below 180 mg/dL (10 mmol/L).  A1c level: less than  7%.  Follow these instructions at home: Questions to Ask Your Health Care Provider   Consider asking the following questions: ? Do I need to meet with a diabetes educator? ? Should I consider joining a support group for people with diabetes? ? What equipment will I need to manage my diabetes at home? ? What diabetes medicines should I take, and when? ? How often should I check my blood glucose? ? What number should I call if I have questions? ? When is my next appointment? General instructions  Take over-the-counter and prescription medicines only as told by your health care provider.  Keep all follow-up visits as told by your health care provider. This is important.  For more information about diabetes, visit: ? American Diabetes Association (ADA): www.diabetes.org ? American Association of Diabetes Educators (AADE): www.diabeteseducator.org/patient-resources Contact a health care provider if:  Your blood glucose level is higher than 240 mg/dL (13.3 mmol/L) for 2 days in a row.  You have been sick or have had a fever for 2 days or more and you are not getting better.  You have any of the following problems for more than 6 hours: ? You cannot eat or drink. ? You have nausea and vomiting. ? You have diarrhea. Get help right away if:  Your blood glucose is below 54 mg/dL (3 mmol/L).  You become confused or you have trouble thinking clearly.  You have difficulty breathing.  You have moderate or large ketone levels in your urine. This information is not intended to replace advice given to you by your health care provider. Make sure you discuss any questions you have with your health care provider. Document Released: 05/10/2000 Document Revised: 10/19/2015 Document Reviewed: 06/16/2015 Elsevier Interactive Patient Education  2017 [employer]  

## 2017-01-30 NOTE — Progress Notes (Signed)
Subjective:  Patient ID: Gregory [name], male    DOB: 08-Sep-1960  Age: 56 y.o. MRN: [id number]  CC: Diabetes   HPI EUCLID CASSETTA presents for f/up - Since I last saw him he was admitted for an episode of DKA and one day prior to this appointment he established with an endocrinologist who has adjusted his insulin. The DKA developed while he was on a business trip in [place]. It appears that there was no inciting illnesses such as an infection but it does sound like he became progressively dehydrated over a several day course. He feels well today and offers no complaints.  Outpatient Medications Prior to Visit  Medication Sig Dispense Refill  . blood glucose meter kit and supplies KIT Dispense based on patient and insurance preference. Use up to four times daily as directed. (FOR ICD-9 250.00, 250.01). 1 each 0  . carvedilol (COREG) 3.125 MG tablet TAKE 1 TABLET TWICE DAILY. 60 tablet 6  . cetirizine (ZYRTEC) 10 MG tablet Take 10 mg by mouth daily.    . insulin aspart (NOVOLOG FLEXPEN) 100 UNIT/ML FlexPen Add to meal coverage Sliding scale CBG 70 - 120: 0 units CBG 121 - 150: 1 unit,  CBG 151 - 200: 2 units,  CBG 201 - 250: 3 units,  CBG 251 - 300: 5 units,  CBG 301 - 350: 7 units,  CBG 351 - 400: 9 units   CBG > 400: 9 units and notify your MD 15 mL 11  . Insulin Glargine (LANTUS) 100 UNIT/ML Solostar Pen Inject 20 Units into the skin at bedtime. 15 mL 3  . Insulin Pen Needle 32G X 5 MM MISC 1 each by Does not apply route at bedtime. For Solostar Pen 100 each 4  . Insulin Syringe-Needle U-100 (INSULIN SYRINGE 1CC/31GX5/16") 31G X 5/16" 1 ML MISC 1 each by Does not apply route 3 (three) times daily before meals. 100 each 6  . levothyroxine (SYNTHROID, LEVOTHROID) 25 MCG tablet Take 25 mcg by mouth daily before breakfast.     . PARoxetine (PAXIL-CR) 25 MG 24 hr tablet Take 1 tablet (25 mg total) by mouth daily. 90 tablet 3  . sacubitril-valsartan (ENTRESTO) 49-51 MG Take 1 tablet by  mouth 2 (two) times daily. HOLD until follow-up with your MD 60 tablet 3  . insulin aspart (NOVOLOG FLEXPEN) 100 UNIT/ML FlexPen Inject 5 Units into the skin 3 (three) times daily with meals. 15 mL 11  . phenazopyridine (PYRIDIUM) 95 MG tablet Take 95 mg by mouth 3 (three) times daily as needed for pain.     No facility-administered medications prior to visit.     ROS Review of Systems  Constitutional: Negative.  Negative for chills, diaphoresis, fatigue and fever.  HENT: Negative.   Eyes: Negative.  Negative for visual disturbance.  Respiratory: Negative.  Negative for chest tightness, shortness of breath and wheezing.   Cardiovascular: Negative.  Negative for chest pain, palpitations and leg swelling.  Gastrointestinal: Negative for abdominal pain, constipation, diarrhea, nausea and vomiting.  Endocrine: Negative.  Negative for polydipsia, polyphagia and polyuria.  Genitourinary: Negative.  Negative for difficulty urinating, dysuria, flank pain and frequency.  Musculoskeletal: Negative.  Negative for back pain, myalgias and neck pain.  Skin: Negative.  Negative for color change and rash.  Allergic/Immunologic: Negative.   Neurological: Negative.  Negative for dizziness.  Hematological: Negative for adenopathy. Does not bruise/bleed easily.  Psychiatric/Behavioral: Negative.     Objective:  BP 120/70 (BP Location: Left  Arm, Patient Position: Sitting, Cuff Size: Large)   Pulse 81   Temp 98.1 F (36.7 C) (Oral)   Resp 16   Ht '6\' 2"'  (1.88 m)   Wt 236 lb (107 kg)   SpO2 97%   BMI 30.30 kg/m   BP Readings from Last 3 Encounters:  01/31/17 127/89  01/30/17 120/70  01/26/17 105/76    Wt Readings from Last 3 Encounters:  01/31/17 237 lb 6.4 oz (107.7 kg)  01/30/17 236 lb (107 kg)  01/24/17 248 lb 9.6 oz (112.8 kg)    Physical Exam  Constitutional: He is oriented to person, place, and time. No distress.  HENT:  Mouth/Throat: Oropharynx is clear and moist. No oropharyngeal  exudate.  Eyes: Conjunctivae are normal. Right eye exhibits no discharge. Left eye exhibits no discharge. No scleral icterus.  Neck: Normal range of motion. Neck supple. No JVD present. No thyromegaly present.  Cardiovascular: Normal rate, regular rhythm and intact distal pulses.  Exam reveals no gallop and no friction rub.   No murmur heard. Pulmonary/Chest: Effort normal and breath sounds normal. No respiratory distress. He has no wheezes. He has no rales. He exhibits no tenderness.  Abdominal: Soft. Bowel sounds are normal. He exhibits no distension and no mass. There is no tenderness. There is no rebound and no guarding.  Musculoskeletal: Normal range of motion. He exhibits no edema, tenderness or deformity.  Lymphadenopathy:    He has no cervical adenopathy.  Neurological: He is alert and oriented to person, place, and time.  Skin: Skin is warm and dry. No rash noted. He is not diaphoretic. No erythema. No pallor.  Vitals reviewed.   Lab Results  Component Value Date   WBC 5.4 01/31/2017   HGB 12.6 (L) 01/31/2017   HCT 37.2 (L) 01/31/2017   PLT 231 01/31/2017   GLUCOSE 223 (H) 01/31/2017   CHOL 197 08/20/2011   TRIG 280.0 (H) 08/20/2011   HDL 33.30 (L) 08/20/2011   LDLDIRECT 117.5 08/20/2011   ALT 11 01/31/2017   AST 17 01/31/2017   NA 136 01/31/2017   K 3.6 01/31/2017   CL 107 01/26/2017   CREATININE 1.3 01/31/2017   BUN 16.9 01/31/2017   CO2 31 (H) 01/31/2017   TSH 0.898 01/23/2017   PSA 1.17 08/20/2011   INR 0.99 04/19/2016   HGBA1C 7.5 (H) 01/24/2017    No results found.  Assessment & Plan:   Bakari was seen today for diabetes.  Diagnoses and all orders for this visit:  IDDM (insulin dependent diabetes mellitus) ([place])- After seeing the endocrinologist yesterday he is committed to doing a sliding scale insulin in addition to the rapid acting insulin with each meal and basal insulin as well. -     insulin aspart (NOVOLOG FLEXPEN) 100 UNIT/ML FlexPen; Inject 9  Units into the skin 3 (three) times daily with meals.   I have discontinued Mr. Sharrar phenazopyridine. I have also changed his insulin aspart. Additionally, I am having him maintain his cetirizine, carvedilol, PARoxetine, levothyroxine, Insulin Pen Needle, INSULIN SYRINGE 1CC/31GX5/16", blood glucose meter kit and supplies, Insulin Glargine, sacubitril-valsartan, and insulin aspart.  Meds ordered this encounter  Medications  . insulin aspart (NOVOLOG FLEXPEN) 100 UNIT/ML FlexPen    Sig: Inject 9 Units into the skin 3 (three) times daily with meals.    Dispense:  15 mL    Refill:  11     Follow-up: Return in about 3 months (around 05/01/2017).  Scarlette Calico, MD

## 2017-01-31 ENCOUNTER — Other Ambulatory Visit (HOSPITAL_BASED_OUTPATIENT_CLINIC_OR_DEPARTMENT_OTHER): Payer: BLUE CROSS/BLUE SHIELD

## 2017-01-31 ENCOUNTER — Ambulatory Visit (HOSPITAL_BASED_OUTPATIENT_CLINIC_OR_DEPARTMENT_OTHER): Payer: BLUE CROSS/BLUE SHIELD | Admitting: Oncology

## 2017-01-31 VITALS — BP 127/89 | HR 73 | Temp 98.8°F | Resp 18 | Ht 74.0 in | Wt 237.4 lb

## 2017-01-31 DIAGNOSIS — C649 Malignant neoplasm of unspecified kidney, except renal pelvis: Secondary | ICD-10-CM | POA: Diagnosis not present

## 2017-01-31 DIAGNOSIS — I429 Cardiomyopathy, unspecified: Secondary | ICD-10-CM | POA: Diagnosis not present

## 2017-01-31 DIAGNOSIS — E119 Type 2 diabetes mellitus without complications: Secondary | ICD-10-CM

## 2017-01-31 DIAGNOSIS — C7800 Secondary malignant neoplasm of unspecified lung: Secondary | ICD-10-CM

## 2017-01-31 LAB — COMPREHENSIVE METABOLIC PANEL
ALBUMIN: 3.3 g/dL — AB (ref 3.5–5.0)
ALK PHOS: 60 U/L (ref 40–150)
ALT: 11 U/L (ref 0–55)
ANION GAP: 6 meq/L (ref 3–11)
AST: 17 U/L (ref 5–34)
BILIRUBIN TOTAL: 0.68 mg/dL (ref 0.20–1.20)
BUN: 16.9 mg/dL (ref 7.0–26.0)
CALCIUM: 9.4 mg/dL (ref 8.4–10.4)
CO2: 31 mEq/L — ABNORMAL HIGH (ref 22–29)
CREATININE: 1.3 mg/dL (ref 0.7–1.3)
Chloride: 99 mEq/L (ref 98–109)
EGFR: 63 mL/min/{1.73_m2} — AB (ref >90)
Glucose: 223 mg/dl — ABNORMAL HIGH (ref 70–140)
Potassium: 3.6 mEq/L (ref 3.5–5.1)
Sodium: 136 mEq/L (ref 136–145)
TOTAL PROTEIN: 6.4 g/dL (ref 6.4–8.3)

## 2017-01-31 LAB — CBC WITH DIFFERENTIAL/PLATELET
BASO%: 0.4 % (ref 0.0–2.0)
Basophils Absolute: 0 10*3/uL (ref 0.0–0.1)
EOS ABS: 0.2 10*3/uL (ref 0.0–0.5)
EOS%: 2.8 % (ref 0.0–7.0)
HEMATOCRIT: 37.2 % — AB (ref 38.4–49.9)
HEMOGLOBIN: 12.6 g/dL — AB (ref 13.0–17.1)
LYMPH#: 0.9 10*3/uL (ref 0.9–3.3)
LYMPH%: 17 % (ref 14.0–49.0)
MCH: 32.9 pg (ref 27.2–33.4)
MCHC: 33.9 g/dL (ref 32.0–36.0)
MCV: 97.1 fL (ref 79.3–98.0)
MONO#: 0.5 10*3/uL (ref 0.1–0.9)
MONO%: 8.5 % (ref 0.0–14.0)
NEUT%: 71.3 % (ref 39.0–75.0)
NEUTROS ABS: 3.9 10*3/uL (ref 1.5–6.5)
PLATELETS: 231 10*3/uL (ref 140–400)
RBC: 3.83 10*6/uL — ABNORMAL LOW (ref 4.20–5.82)
RDW: 14.4 % (ref 11.0–14.6)
WBC: 5.4 10*3/uL (ref 4.0–10.3)

## 2017-01-31 NOTE — Progress Notes (Signed)
Hematology and Oncology Follow Up Visit  Gregory [name] [id number] 08/10/1960 56 y.o. 01/31/2017 9:34 AM Gregory [name], MDJones, Gregory Right, MD   Principle Diagnosis: 56 year old gentleman with renal cell cancer diagnosed in August 2017. He was found to have pulmonary nodules and currently has metastatic disease.   Prior Therapy: He is status post radical nephrectomy done on 02/07/2016 at [facility]. 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 The measurements of the tumor is 9.9 x 9.0 x 9.0 cm. He did have small pulmonary nodules and a repeat scan on 03/25/2016 showed a 0.6 mm left lower lobe nodule previously measuring 3 mm. There is a 1 cm left lower lobe nodule previously was 0.5.  Current therapy:  Nivolumab 240 mg every 2 weeks started on 06/07/2016. This is his first-line therapy chosen because of cardiomyopathy. He is here for evaluation before the next treatment.  Interim History: Gregory [name] presents today for a follow-up visit. Since the last visit, he reports recent hospitalization for hyperglycemia and DKA. He reported last week while he was traveling reported symptoms of weakness and fatigue and dehydration. He was eating poorly and urinating frequently. He was evaluated at the [facility] on 01/23/2017 and found tablet sugar close to 500. He was started on insulin and subsequently was hospitalized on that evening after his symptoms continues to worsen. He was discharged on 01/26/2017.  Since his discharge, he has been feeling reasonably well and his blood sugar is back under good control. He has been started on insulin and currently follows with endocrinology regarding this issue. His blood sugar today is 127 and has been eating better at this time. His energy and performance status is back to normal.  He does not report any headaches or blurry vision, syncope or seizures. He does not report any fevers, chills, sweats or weight  loss. His appetite is excellent and have gained weight. He does not report any chest pain, palpitation, orthopnea or leg edema. He does not report any cough, wheezing or hemoptysis. He does not report any nausea, vomiting or abdominal pain. He does not report any frequency urgency or hesitancy. Remaining review of systems unremarkable.   Medications: I have reviewed the patient's current medications.  Current Outpatient Prescriptions  Medication Sig Dispense Refill  . blood glucose meter kit and supplies KIT Dispense based on patient and insurance preference. Use up to four times daily as directed. (FOR ICD-9 250.00, 250.01). 1 each 0  . carvedilol (COREG) 3.125 MG tablet TAKE 1 TABLET TWICE DAILY. 60 tablet 6  . cetirizine (ZYRTEC) 10 MG tablet Take 10 mg by mouth daily.    . insulin aspart (NOVOLOG FLEXPEN) 100 UNIT/ML FlexPen Add to meal coverage Sliding scale CBG 70 - 120: 0 units CBG 121 - 150: 1 unit,  CBG 151 - 200: 2 units,  CBG 201 - 250: 3 units,  CBG 251 - 300: 5 units,  CBG 301 - 350: 7 units,  CBG 351 - 400: 9 units   CBG > 400: 9 units and notify your MD 15 mL 11  . insulin aspart (NOVOLOG FLEXPEN) 100 UNIT/ML FlexPen Inject 9 Units into the skin 3 (three) times daily with meals. 15 mL 11  . Insulin Glargine (LANTUS) 100 UNIT/ML Solostar Pen Inject 20 Units into the skin at bedtime. 15 mL 3  . Insulin Pen Needle 32G X 5 MM MISC 1 each by Does not apply route at bedtime. For  Solostar Pen 100 each 4  . Insulin Syringe-Needle U-100 (INSULIN SYRINGE 1CC/31GX5/16") 31G X 5/16" 1 ML MISC 1 each by Does not apply route 3 (three) times daily before meals. 100 each 6  . levothyroxine (SYNTHROID, LEVOTHROID) 25 MCG tablet Take 25 mcg by mouth daily before breakfast.     . PARoxetine (PAXIL-CR) 25 MG 24 hr tablet Take 1 tablet (25 mg total) by mouth daily. 90 tablet 3  . sacubitril-valsartan (ENTRESTO) 49-51 MG Take 1 tablet by mouth 2 (two) times daily. HOLD until follow-up with your MD 60 tablet  3   No current facility-administered medications for this visit.      Allergies: No Known Allergies  Past Medical History, Surgical history, Social history, and Family History were reviewed and updated.   Physical Exam: Blood pressure 127/89, pulse 73, temperature 98.8 F (37.1 C), temperature source Oral, resp. rate 18, height _0  (1.88 m), weight 237 lb 6.4 oz (107.7 kg), SpO2 100 %. ECOG: 0 General appearance: Alert, awake gentleman appeared without distress. Head: Normocephalic, without obvious abnormality no oral thrush or ulcers.. Neck: no adenopathy Lymph nodes: Cervical, supraclavicular, and axillary nodes normal. Heart:regular rate and rhythm, S1, S2 normal, no murmur, click, rub or gallop Lung:chest clear, no wheezing, rales, normal symmetric air entry Abdomin: soft, non-tender, without masses or organomegaly no shifting dullness or ascites. EXT:no erythema, induration, or nodules   Lab Results: Lab Results  Component Value Date   WBC 5.4 01/31/2017   HGB 12.6 (L) 01/31/2017   HCT 37.2 (L) 01/31/2017   MCV 97.1 01/31/2017   PLT 231 01/31/2017     Chemistry      Component Value Date/Time   NA 136 01/31/2017 0843   K 3.6 01/31/2017 0843   CL 107 01/26/2017 0533   CO2 31 (H) 01/31/2017 0843   BUN 16.9 01/31/2017 0843   CREATININE 1.3 01/31/2017 0843      Component Value Date/Time   CALCIUM 9.4 01/31/2017 0843   ALKPHOS 60 01/31/2017 0843   AST 17 01/31/2017 0843   ALT 11 01/31/2017 0843   BILITOT 0.68 01/31/2017 0843     CT scan obtained on 12/02/2016 at [facility] : IMPRESSION: 1. Overall stable to minimal interval increase in size in the dominant pulmonary nodule in the left lower lobe. 2. Overall no significant change in the hypervascular  foci within the liver that can be followed closely.   Impression and Plan: 57 year old with the following issues:  1. 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 He is status  post radical nephrectomy done on 02/07/2016 at [facility]. 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 The measurements of the tumor is 9.9 x 9.0 x 9.0 cm. He did have small pulmonary nodules   He is currently receiving Nivolumab 240 mg every 2 weeks started on 06/07/2016.   CT scan obtained in April 2018 at [facility] indicate stable disease.   CT scan obtained on 12/02/2016 was reviewed and showed stable disease.  Risks and benefits of continuing this medication was discussed today in detail with the patient. Given his recent episode of diabetic ketoacidosis and new diagnosis of diabetes which is likely linked to Nivolumab. His blood sugar is under excellent control at this time and he is willing to continue at least for the immediate future. He understands that endocrine and metabolic complications associated with this medication such as worsening diabetes, thyroid disease among others are  possibility.  After discussion today, we will resume this therapy as scheduled on 02/07/2017 every 2 weeks until October 2018 where he will have a repeat CT scan at [facility].  2. Diarrhea: He is no longer reporting any diarrhea at this time.  3. Cardiomyopathy: Continues to follow with cardiology and his cardiac medications are optimized. His EF continues to be close to 20%.  4. Thyroid function surveillance: He was started on Synthroid during his visit at [facility]. His last TSH was normal.  5. Diabetes: His currently on insulin and followed by his primary care physician as well as endocrinology.  6. Follow-up: Will be in one week for his next infusion and in 3 weeks for an [facility] follow-up.   Zola Button, MD 9/7/20189:34 AM

## 2017-02-05 DIAGNOSIS — R350 Frequency of micturition: Secondary | ICD-10-CM | POA: Diagnosis not present

## 2017-02-07 ENCOUNTER — Encounter: Payer: BLUE CROSS/BLUE SHIELD | Admitting: Nutrition

## 2017-02-07 ENCOUNTER — Ambulatory Visit (HOSPITAL_BASED_OUTPATIENT_CLINIC_OR_DEPARTMENT_OTHER): Payer: BLUE CROSS/BLUE SHIELD

## 2017-02-07 ENCOUNTER — Other Ambulatory Visit (HOSPITAL_BASED_OUTPATIENT_CLINIC_OR_DEPARTMENT_OTHER): Payer: BLUE CROSS/BLUE SHIELD

## 2017-02-07 ENCOUNTER — Ambulatory Visit: Payer: BLUE CROSS/BLUE SHIELD | Admitting: Hematology

## 2017-02-07 DIAGNOSIS — C7800 Secondary malignant neoplasm of unspecified lung: Secondary | ICD-10-CM | POA: Diagnosis not present

## 2017-02-07 DIAGNOSIS — C649 Malignant neoplasm of unspecified kidney, except renal pelvis: Secondary | ICD-10-CM

## 2017-02-07 DIAGNOSIS — Z5112 Encounter for antineoplastic immunotherapy: Secondary | ICD-10-CM | POA: Diagnosis not present

## 2017-02-07 LAB — COMPREHENSIVE METABOLIC PANEL
ALT: 10 U/L (ref 0–55)
ANION GAP: 8 meq/L (ref 3–11)
AST: 14 U/L (ref 5–34)
Albumin: 3.2 g/dL — ABNORMAL LOW (ref 3.5–5.0)
Alkaline Phosphatase: 57 U/L (ref 40–150)
BILIRUBIN TOTAL: 0.54 mg/dL (ref 0.20–1.20)
BUN: 16.6 mg/dL (ref 7.0–26.0)
CALCIUM: 9.3 mg/dL (ref 8.4–10.4)
CO2: 26 mEq/L (ref 22–29)
CREATININE: 1.3 mg/dL (ref 0.7–1.3)
Chloride: 102 mEq/L (ref 98–109)
EGFR: 58 mL/min/{1.73_m2} — AB (ref >90)
Glucose: 305 mg/dl — ABNORMAL HIGH (ref 70–140)
Potassium: 4.1 mEq/L (ref 3.5–5.1)
Sodium: 136 mEq/L (ref 136–145)
Total Protein: 6.3 g/dL — ABNORMAL LOW (ref 6.4–8.3)

## 2017-02-07 LAB — CBC WITH DIFFERENTIAL/PLATELET
BASO%: 0.7 % (ref 0.0–2.0)
Basophils Absolute: 0 10*3/uL (ref 0.0–0.1)
EOS ABS: 0.2 10*3/uL (ref 0.0–0.5)
EOS%: 3 % (ref 0.0–7.0)
HEMATOCRIT: 37.3 % — AB (ref 38.4–49.9)
HGB: 12.8 g/dL — ABNORMAL LOW (ref 13.0–17.1)
LYMPH#: 1.2 10*3/uL (ref 0.9–3.3)
LYMPH%: 17.7 % (ref 14.0–49.0)
MCH: 34.1 pg — ABNORMAL HIGH (ref 27.2–33.4)
MCHC: 34.3 g/dL (ref 32.0–36.0)
MCV: 99.4 fL — AB (ref 79.3–98.0)
MONO#: 0.5 10*3/uL (ref 0.1–0.9)
MONO%: 7.5 % (ref 0.0–14.0)
NEUT#: 4.7 10*3/uL (ref 1.5–6.5)
NEUT%: 71.1 % (ref 39.0–75.0)
PLATELETS: 247 10*3/uL (ref 140–400)
RBC: 3.76 10*6/uL — ABNORMAL LOW (ref 4.20–5.82)
RDW: 14.8 % — ABNORMAL HIGH (ref 11.0–14.6)
WBC: 6.6 10*3/uL (ref 4.0–10.3)

## 2017-02-07 MED ORDER — NIVOLUMAB CHEMO INJECTION 100 MG/10ML
240.0000 mg | Freq: Once | INTRAVENOUS | Status: AC
  Administered 2017-02-07: 240 mg via INTRAVENOUS
  Filled 2017-02-07: qty 24

## 2017-02-07 MED ORDER — SODIUM CHLORIDE 0.9 % IV SOLN
Freq: Once | INTRAVENOUS | Status: AC
  Administered 2017-02-07: 10:00:00 via INTRAVENOUS

## 2017-02-07 NOTE — Patient Instructions (Signed)
[facility] Discharge Instructions for Patients Receiving Chemotherapy  Today you received the following chemotherapy agents:  Nivolumab.  To help prevent nausea and vomiting after your treatment, we encourage you to take your nausea medication as directed.   If you develop nausea and vomiting that is not controlled by your nausea medication, call the clinic.   BELOW ARE SYMPTOMS THAT SHOULD BE REPORTED IMMEDIATELY:  *FEVER GREATER THAN 100.5 F  *CHILLS WITH OR WITHOUT FEVER  NAUSEA AND VOMITING THAT IS NOT CONTROLLED WITH YOUR NAUSEA MEDICATION  *UNUSUAL SHORTNESS OF BREATH  *UNUSUAL BRUISING OR BLEEDING  TENDERNESS IN MOUTH AND THROAT WITH OR WITHOUT PRESENCE OF ULCERS  *URINARY PROBLEMS  *BOWEL PROBLEMS  UNUSUAL RASH Items with * indicate a potential emergency and should be followed up as soon as possible.  Feel free to call the clinic you have any questions or concerns. The clinic phone number is [phone].  Please show the CHEMO ALERT CARD at check-in to the Emergency Department and triage nurse.   

## 2017-02-17 ENCOUNTER — Other Ambulatory Visit (INDEPENDENT_AMBULATORY_CARE_PROVIDER_SITE_OTHER): Payer: BLUE CROSS/BLUE SHIELD

## 2017-02-17 ENCOUNTER — Ambulatory Visit (INDEPENDENT_AMBULATORY_CARE_PROVIDER_SITE_OTHER): Payer: BLUE CROSS/BLUE SHIELD | Admitting: Internal Medicine

## 2017-02-17 ENCOUNTER — Encounter: Payer: Self-pay | Admitting: Internal Medicine

## 2017-02-17 VITALS — BP 118/80 | HR 74 | Temp 97.6°F | Resp 16 | Ht 74.0 in | Wt 232.0 lb

## 2017-02-17 DIAGNOSIS — IMO0001 Reserved for inherently not codable concepts without codable children: Secondary | ICD-10-CM

## 2017-02-17 DIAGNOSIS — R972 Elevated prostate specific antigen [PSA]: Secondary | ICD-10-CM

## 2017-02-17 DIAGNOSIS — E119 Type 2 diabetes mellitus without complications: Secondary | ICD-10-CM

## 2017-02-17 DIAGNOSIS — D539 Nutritional anemia, unspecified: Secondary | ICD-10-CM | POA: Diagnosis not present

## 2017-02-17 DIAGNOSIS — D513 Other dietary vitamin B12 deficiency anemia: Secondary | ICD-10-CM | POA: Diagnosis not present

## 2017-02-17 DIAGNOSIS — Z0000 Encounter for general adult medical examination without abnormal findings: Secondary | ICD-10-CM | POA: Diagnosis not present

## 2017-02-17 DIAGNOSIS — D649 Anemia, unspecified: Secondary | ICD-10-CM | POA: Insufficient documentation

## 2017-02-17 DIAGNOSIS — D518 Other vitamin B12 deficiency anemias: Secondary | ICD-10-CM | POA: Insufficient documentation

## 2017-02-17 DIAGNOSIS — Z794 Long term (current) use of insulin: Secondary | ICD-10-CM | POA: Diagnosis not present

## 2017-02-17 DIAGNOSIS — Z1159 Encounter for screening for other viral diseases: Secondary | ICD-10-CM | POA: Diagnosis not present

## 2017-02-17 DIAGNOSIS — D508 Other iron deficiency anemias: Secondary | ICD-10-CM

## 2017-02-17 LAB — IBC PANEL
Iron: 45 ug/dL (ref 42–165)
Saturation Ratios: 18.8 % — ABNORMAL LOW (ref 20.0–50.0)
TRANSFERRIN: 171 mg/dL — AB (ref 212.0–360.0)

## 2017-02-17 LAB — LIPID PANEL
CHOL/HDL RATIO: 5
CHOLESTEROL: 147 mg/dL (ref 0–200)
HDL: 30.5 mg/dL — ABNORMAL LOW (ref >39.00)
LDL CALC: 99 mg/dL (ref 0–99)
NonHDL: 116.51
TRIGLYCERIDES: 86 mg/dL (ref 0.0–149.0)
VLDL: 17.2 mg/dL (ref 0.0–40.0)

## 2017-02-17 LAB — MICROALBUMIN / CREATININE URINE RATIO
Creatinine,U: 138.7 mg/dL
MICROALB/CREAT RATIO: 3.4 mg/g (ref 0.0–30.0)
Microalb, Ur: 4.7 mg/dL — ABNORMAL HIGH (ref 0.0–1.9)

## 2017-02-17 LAB — FERRITIN: Ferritin: 321.2 ng/mL (ref 22.0–322.0)

## 2017-02-17 LAB — PSA: PSA: 6.44 ng/mL — AB (ref 0.10–4.00)

## 2017-02-17 LAB — VITAMIN B12: Vitamin B-12: 119 pg/mL — ABNORMAL LOW (ref 211–911)

## 2017-02-17 LAB — FOLATE: Folate: 13.6 ng/mL (ref >5.9)

## 2017-02-17 MED ORDER — FERRAPLUS 90 90-1 MG PO TABS: 1.0000 | ORAL_TABLET | Freq: Every day | ORAL | 5 refills | Status: DC

## 2017-02-17 NOTE — Progress Notes (Signed)
Subjective:  Patient ID: Gregory [name], male    DOB: Nov 19, 1960  Age: 56 y.o. MRN: [id number]  CC: Annual Exam; Hypertension; Hyperlipidemia; Diabetes; and Anemia  He HPI Gregory [name] presents for a CPX.  He complains of fatigue but offers no other complaints. His labwork 10 days ago showed a macrocytic anemia. He is working diligently with endocrinology to control his blood sugars. He has been compliant with the sliding scale regular insulin as well as the basal insulin.  Outpatient Medications Prior to Visit  Medication Sig Dispense Refill  . blood glucose meter kit and supplies KIT Dispense based on patient and insurance preference. Use up to four times daily as directed. (FOR ICD-9 250.00, 250.01). 1 each 0  . carvedilol (COREG) 3.125 MG tablet TAKE 1 TABLET TWICE DAILY. 60 tablet 6  . cetirizine (ZYRTEC) 10 MG tablet Take 10 mg by mouth daily.    . insulin aspart (NOVOLOG FLEXPEN) 100 UNIT/ML FlexPen Add to meal coverage Sliding scale CBG 70 - 120: 0 units CBG 121 - 150: 1 unit,  CBG 151 - 200: 2 units,  CBG 201 - 250: 3 units,  CBG 251 - 300: 5 units,  CBG 301 - 350: 7 units,  CBG 351 - 400: 9 units   CBG > 400: 9 units and notify your MD 15 mL 11  . insulin aspart (NOVOLOG FLEXPEN) 100 UNIT/ML FlexPen Inject 9 Units into the skin 3 (three) times daily with meals. 15 mL 11  . Insulin Glargine (LANTUS) 100 UNIT/ML Solostar Pen Inject 20 Units into the skin at bedtime. 15 mL 3  . Insulin Pen Needle 32G X 5 MM MISC 1 each by Does not apply route at bedtime. For Solostar Pen 100 each 4  . Insulin Syringe-Needle U-100 (INSULIN SYRINGE 1CC/31GX5/16") 31G X 5/16" 1 ML MISC 1 each by Does not apply route 3 (three) times daily before meals. 100 each 6  . levothyroxine (SYNTHROID, LEVOTHROID) 25 MCG tablet Take 25 mcg by mouth daily before breakfast.     . PARoxetine (PAXIL-CR) 25 MG 24 hr tablet Take 1 tablet (25 mg total) by mouth daily. 90 tablet 3  . sacubitril-valsartan (ENTRESTO) 49-51 MG  Take 1 tablet by mouth 2 (two) times daily. HOLD until follow-up with your MD 60 tablet 3   No facility-administered medications prior to visit.     ROS Review of Systems  Constitutional: Positive for fatigue. Negative for appetite change, chills, diaphoresis, fever and unexpected weight change.  HENT: Negative.  Negative for trouble swallowing.   Eyes: Negative.  Negative for visual disturbance.  Respiratory: Negative.  Negative for cough, chest tightness, shortness of breath, wheezing and stridor.   Cardiovascular: Negative for chest pain, palpitations and leg swelling.  Gastrointestinal: Negative for abdominal pain, blood in stool, constipation, diarrhea, nausea and vomiting.  Endocrine: Positive for polyuria. Negative for polydipsia and polyphagia.  Genitourinary: Positive for frequency. Negative for difficulty urinating, dysuria, flank pain, penile swelling, scrotal swelling, testicular pain and urgency.  Musculoskeletal: Negative.  Negative for arthralgias.  Skin: Negative.  Negative for color change and rash.  Allergic/Immunologic: Negative.   Neurological: Negative.  Negative for dizziness, weakness, light-headedness and numbness.  Hematological: Negative for adenopathy. Does not bruise/bleed easily.  Psychiatric/Behavioral: Negative.     Objective:  BP 118/80 (BP Location: Left Arm, Patient Position: Sitting, Cuff Size: Normal)   Pulse 74   Temp 97.6 F (36.4 C) (Oral)   Resp 16   Ht 6' 2" (1.88  m)   Wt 232 lb (105.2 kg)   SpO2 99%   BMI 29.79 kg/m   BP Readings from Last 3 Encounters:  02/17/17 118/80  01/31/17 127/89  01/30/17 120/70    Wt Readings from Last 3 Encounters:  02/17/17 232 lb (105.2 kg)  01/31/17 237 lb 6.4 oz (107.7 kg)  01/30/17 236 lb (107 kg)    Physical Exam  Constitutional: He is oriented to person, place, and time. No distress.  HENT:  Mouth/Throat: Oropharynx is clear and moist. No oropharyngeal exudate.  Eyes: Conjunctivae are  normal. Right eye exhibits no discharge. Left eye exhibits no discharge. No scleral icterus.  Neck: Normal range of motion. Neck supple. No JVD present. No thyromegaly present.  Cardiovascular: Normal rate, regular rhythm and intact distal pulses.  Exam reveals no gallop and no friction rub.   No murmur heard. Pulmonary/Chest: Effort normal and breath sounds normal. No respiratory distress. He has no wheezes. He has no rales. He exhibits no tenderness.  Abdominal: Soft. Bowel sounds are normal. He exhibits no distension and no mass. There is no tenderness. There is no rebound and no guarding. Hernia confirmed negative in the right inguinal area and confirmed negative in the left inguinal area.  Genitourinary: Testes normal and penis normal. Rectal exam shows no external hemorrhoid, no internal hemorrhoid, no fissure, no mass, no tenderness, anal tone normal and guaiac negative stool. 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 Prostate is not tender. Right testis shows no mass and no swelling. Right testis is descended. Left testis shows no swelling. Left testis is descended. Circumcised. No penile erythema or penile tenderness. No discharge found.  Musculoskeletal: Normal range of motion. He exhibits no edema, tenderness or deformity.  Lymphadenopathy:    He has no cervical adenopathy.       Right: No inguinal adenopathy present.       Left: No inguinal adenopathy present.  Neurological: He is alert and oriented to person, place, and time.  Skin: Skin is warm and dry. No rash noted. He is not diaphoretic. No erythema. No pallor.  Psychiatric: He has a normal mood and affect. His behavior is normal. Judgment and thought content normal.  Vitals reviewed.   Lab Results  Component Value Date   WBC 6.6 02/07/2017   HGB 12.8 (L) 02/07/2017   HCT 37.3 (L) 02/07/2017   PLT 247 02/07/2017   GLUCOSE 305 (H) 02/07/2017   CHOL 147 02/17/2017   TRIG 86.0 02/17/2017   HDL 30.50 (L) 02/17/2017    LDLDIRECT 117.5 08/20/2011   LDLCALC 99 02/17/2017   ALT 10 02/07/2017   AST 14 02/07/2017   NA 136 02/07/2017   K 4.1 02/07/2017   CL 107 01/26/2017   CREATININE 1.3 02/07/2017   BUN 16.6 02/07/2017   CO2 26 02/07/2017   TSH 0.898 01/23/2017   PSA 6.44 (H) 02/17/2017   INR 0.99 04/19/2016   HGBA1C 7.5 (H) 01/24/2017   MICROALBUR 4.7 (H) 02/17/2017    No results found.  Assessment & Plan:   Emet was seen today for annual exam, hypertension, hyperlipidemia, diabetes and anemia.  Diagnoses and all orders for this visit:  Routine health maintenance- exam completed, labs reviewed, vaccines reviewed and updated, colon cancer screening is up-to-date, patient education material was given. -     Lipid panel; Future -     PSA; Future  Deficiency anemia- he is being treated for renal cell carcinoma so this may be the anemia of chronic disease but will  also screen for vitamin deficiencies. -     Ferritin; Future -     Vitamin B1; Future -     IBC panel; Future -     Vitamin B12; Future -     Folate; Future  IDDM (insulin dependent diabetes mellitus) ([place])- he has mild microalbuminuria. We'll continue valsartan. -     Microalbumin / creatinine urine ratio; Future  Need for hepatitis C screening test -     Hepatitis C antibody; Future  PSA elevation- his PSA is elevated at 6.44 and he has a very large gland. I have asked him to see urology to see if there needs to be consideration for a biopsy to rule out prostate cancer. -     Ambulatory referral to Urology  Other dietary vitamin B12 deficiency anemia- will start parenteral B12 replacement therapy.  Other iron deficiency anemia- will start iron replacement therapy. -     Iron-Folic [id number] (FERRAPLUS 90) 90-1 MG TABS; Take 1 tablet by mouth daily.   I am having Mr. Gago start on FERRAPLUS 90. I am also having him maintain his cetirizine, carvedilol, PARoxetine, levothyroxine, Insulin Pen Needle, INSULIN SYRINGE  1CC/31GX5/16", blood glucose meter kit and supplies, Insulin Glargine, sacubitril-valsartan, insulin aspart, and insulin aspart.  Meds ordered this encounter  Medications  . Iron-Folic [id number] (FERRAPLUS 90) 90-1 MG TABS    Sig: Take 1 tablet by mouth daily.    Dispense:  30 tablet    Refill:  5     Follow-up: Return in about 6 months (around 08/17/2017).  Scarlette Calico, MD

## 2017-02-17 NOTE — Patient Instructions (Signed)

## 2017-02-18 ENCOUNTER — Ambulatory Visit (INDEPENDENT_AMBULATORY_CARE_PROVIDER_SITE_OTHER): Payer: BLUE CROSS/BLUE SHIELD

## 2017-02-18 DIAGNOSIS — E538 Deficiency of other specified B group vitamins: Secondary | ICD-10-CM

## 2017-02-18 MED ORDER — CYANOCOBALAMIN 1000 MCG/ML IJ SOLN
1000.0000 ug | Freq: Once | INTRAMUSCULAR | Status: AC
  Administered 2017-02-18: 1000 ug via INTRAMUSCULAR

## 2017-02-19 DIAGNOSIS — M25561 Pain in right knee: Secondary | ICD-10-CM | POA: Diagnosis not present

## 2017-02-19 DIAGNOSIS — M1711 Unilateral primary osteoarthritis, right knee: Secondary | ICD-10-CM | POA: Diagnosis not present

## 2017-02-19 DIAGNOSIS — M25461 Effusion, right knee: Secondary | ICD-10-CM | POA: Diagnosis not present

## 2017-02-20 ENCOUNTER — Other Ambulatory Visit: Payer: Self-pay | Admitting: Internal Medicine

## 2017-02-20 ENCOUNTER — Encounter: Payer: Self-pay | Admitting: Internal Medicine

## 2017-02-20 DIAGNOSIS — E519 Thiamine deficiency, unspecified: Secondary | ICD-10-CM

## 2017-02-20 LAB — HEPATITIS C ANTIBODY
Hepatitis C Ab: NONREACTIVE
SIGNAL TO CUT-OFF: 0.01 (ref <1.00)

## 2017-02-20 LAB — VITAMIN B1: VITAMIN B1 (THIAMINE): 7 nmol/L — AB (ref 8–30)

## 2017-02-20 MED ORDER — VITAMIN B-1 100 MG PO TABS: 100.0000 mg | ORAL_TABLET | Freq: Every day | ORAL | 1 refills | Status: DC

## 2017-02-21 ENCOUNTER — Ambulatory Visit (HOSPITAL_BASED_OUTPATIENT_CLINIC_OR_DEPARTMENT_OTHER): Payer: BLUE CROSS/BLUE SHIELD

## 2017-02-21 ENCOUNTER — Telehealth: Payer: Self-pay | Admitting: Oncology

## 2017-02-21 ENCOUNTER — Other Ambulatory Visit: Payer: BLUE CROSS/BLUE SHIELD

## 2017-02-21 ENCOUNTER — Ambulatory Visit (HOSPITAL_BASED_OUTPATIENT_CLINIC_OR_DEPARTMENT_OTHER): Payer: BLUE CROSS/BLUE SHIELD | Admitting: Oncology

## 2017-02-21 ENCOUNTER — Ambulatory Visit: Payer: BLUE CROSS/BLUE SHIELD

## 2017-02-21 ENCOUNTER — Other Ambulatory Visit (HOSPITAL_BASED_OUTPATIENT_CLINIC_OR_DEPARTMENT_OTHER): Payer: BLUE CROSS/BLUE SHIELD

## 2017-02-21 VITALS — BP 118/80 | HR 96 | Temp 98.5°F | Resp 17 | Ht 74.0 in | Wt 233.9 lb

## 2017-02-21 DIAGNOSIS — I429 Cardiomyopathy, unspecified: Secondary | ICD-10-CM | POA: Diagnosis not present

## 2017-02-21 DIAGNOSIS — Z5112 Encounter for antineoplastic immunotherapy: Secondary | ICD-10-CM | POA: Diagnosis not present

## 2017-02-21 DIAGNOSIS — C7800 Secondary malignant neoplasm of unspecified lung: Secondary | ICD-10-CM

## 2017-02-21 DIAGNOSIS — C649 Malignant neoplasm of unspecified kidney, except renal pelvis: Secondary | ICD-10-CM

## 2017-02-21 DIAGNOSIS — R972 Elevated prostate specific antigen [PSA]: Secondary | ICD-10-CM

## 2017-02-21 DIAGNOSIS — E119 Type 2 diabetes mellitus without complications: Secondary | ICD-10-CM | POA: Diagnosis not present

## 2017-02-21 LAB — CBC WITH DIFFERENTIAL/PLATELET
BASO%: 0.6 % (ref 0.0–2.0)
BASOS ABS: 0 10*3/uL (ref 0.0–0.1)
EOS%: 4.4 % (ref 0.0–7.0)
Eosinophils Absolute: 0.3 10*3/uL (ref 0.0–0.5)
HEMATOCRIT: 36.9 % — AB (ref 38.4–49.9)
HEMOGLOBIN: 12.7 g/dL — AB (ref 13.0–17.1)
LYMPH#: 0.8 10*3/uL — AB (ref 0.9–3.3)
LYMPH%: 11.9 % — ABNORMAL LOW (ref 14.0–49.0)
MCH: 34.4 pg — AB (ref 27.2–33.4)
MCHC: 34.4 g/dL (ref 32.0–36.0)
MCV: 99.8 fL — ABNORMAL HIGH (ref 79.3–98.0)
MONO#: 0.6 10*3/uL (ref 0.1–0.9)
MONO%: 8.1 % (ref 0.0–14.0)
NEUT%: 75 % (ref 39.0–75.0)
NEUTROS ABS: 5.3 10*3/uL (ref 1.5–6.5)
Platelets: 224 10*3/uL (ref 140–400)
RBC: 3.7 10*6/uL — ABNORMAL LOW (ref 4.20–5.82)
RDW: 14.2 % (ref 11.0–14.6)
WBC: 7.1 10*3/uL (ref 4.0–10.3)

## 2017-02-21 LAB — COMPREHENSIVE METABOLIC PANEL
ALBUMIN: 3 g/dL — AB (ref 3.5–5.0)
ALK PHOS: 65 U/L (ref 40–150)
ALT: <6 U/L (ref 0–55)
AST: 11 U/L (ref 5–34)
Anion Gap: 7 mEq/L (ref 3–11)
BILIRUBIN TOTAL: 0.68 mg/dL (ref 0.20–1.20)
BUN: 15.6 mg/dL (ref 7.0–26.0)
CALCIUM: 9.2 mg/dL (ref 8.4–10.4)
CO2: 27 mEq/L (ref 22–29)
CREATININE: 1.1 mg/dL (ref 0.7–1.3)
Chloride: 104 mEq/L (ref 98–109)
EGFR: 72 mL/min/{1.73_m2} — ABNORMAL LOW (ref >90)
GLUCOSE: 237 mg/dL — AB (ref 70–140)
Potassium: 4 mEq/L (ref 3.5–5.1)
SODIUM: 139 meq/L (ref 136–145)
TOTAL PROTEIN: 6.4 g/dL (ref 6.4–8.3)

## 2017-02-21 MED ORDER — SODIUM CHLORIDE 0.9 % IV SOLN
240.0000 mg | Freq: Once | INTRAVENOUS | Status: AC
  Administered 2017-02-21: 240 mg via INTRAVENOUS
  Filled 2017-02-21: qty 24

## 2017-02-21 MED ORDER — SODIUM CHLORIDE 0.9% FLUSH
10.0000 mL | INTRAVENOUS | Status: DC | PRN
  Filled 2017-02-21: qty 10

## 2017-02-21 MED ORDER — HEPARIN SOD (PORK) LOCK FLUSH 100 UNIT/ML IV SOLN
500.0000 [IU] | Freq: Once | INTRAVENOUS | Status: DC | PRN
  Filled 2017-02-21: qty 5

## 2017-02-21 MED ORDER — SODIUM CHLORIDE 0.9 % IV SOLN
Freq: Once | INTRAVENOUS | Status: AC
  Administered 2017-02-21: 10:00:00 via INTRAVENOUS

## 2017-02-21 NOTE — Patient Instructions (Signed)
Nivolumab injection What is this medicine? NIVOLUMAB (nye VOL ue mab) is a monoclonal antibody. It is used to treat melanoma, lung cancer, kidney cancer, head and neck cancer, Hodgkin lymphoma, urothelial cancer, colon cancer, and liver cancer. This medicine may be used for other purposes; ask your health care provider or pharmacist if you have questions. COMMON BRAND NAME(S): Opdivo What should I tell my health care provider before I take this medicine? They need to know if you have any of these conditions: -diabetes -immune system problems -kidney disease -liver disease -lung disease -organ transplant -stomach or intestine problems -thyroid disease -an unusual or allergic reaction to nivolumab, other medicines, foods, dyes, or preservatives -pregnant or trying to get pregnant -breast-feeding How should I use this medicine? This medicine is for infusion into a vein. It is given by a health care professional in a hospital or clinic setting. A special MedGuide will be given to you before each treatment. Be sure to read this information carefully each time. Talk to your pediatrician regarding the use of this medicine in children. While this drug may be prescribed for children as young as 12 years for selected conditions, precautions do apply. Overdosage: If you think you have taken too much of this medicine contact a poison control center or emergency room at once. NOTE: This medicine is only for you. Do not share this medicine with others. What if I miss a dose? It is important not to miss your dose. Call your doctor or health care professional if you are unable to keep an appointment. What may interact with this medicine? Interactions have not been studied. Give your health care provider a list of all the medicines, herbs, non-prescription drugs, or dietary supplements you use. Also tell them if you smoke, drink alcohol, or use illegal drugs. Some items may interact with your  medicine. This list may not describe all possible interactions. Give your health care provider a list of all the medicines, herbs, non-prescription drugs, or dietary supplements you use. Also tell them if you smoke, drink alcohol, or use illegal drugs. Some items may interact with your medicine. What should I watch for while using this medicine? This drug may make you feel generally unwell. Continue your course of treatment even though you feel ill unless your doctor tells you to stop. You may need blood work done while you are taking this medicine. Do not become pregnant while taking this medicine or for 5 months after stopping it. Women should inform their doctor if they wish to become pregnant or think they might be pregnant. There is a potential for serious side effects to an unborn child. Talk to your health care professional or pharmacist for more information. Do not breast-feed an infant while taking this medicine. What side effects may I notice from receiving this medicine? Side effects that you should report to your doctor or health care professional as soon as possible: -allergic reactions like skin rash, itching or hives, swelling of the face, lips, or tongue -black, tarry stools -blood in the urine -bloody or watery diarrhea -changes in vision -change in sex drive -changes in emotions or moods -chest pain -confusion -cough -decreased appetite -diarrhea -facial flushing -feeling faint or lightheaded -fever, chills -hair loss -hallucination, loss of contact with reality -headache -irritable -joint pain -loss of memory -muscle pain -muscle weakness -seizures -shortness of breath -signs and symptoms of high blood sugar such as dizziness; dry mouth; dry skin; fruity breath; nausea; stomach pain; increased hunger or thirst; increased   urination -signs and symptoms of kidney injury like trouble passing urine or change in the amount of urine -signs and symptoms of liver injury  like dark yellow or brown urine; general ill feeling or flu-like symptoms; light-colored stools; loss of appetite; nausea; right upper belly pain; unusually weak or tired; yellowing of the eyes or skin -stiff neck -swelling of the ankles, feet, hands -weight gain Side effects that usually do not require medical attention (report to your doctor or health care professional if they continue or are bothersome): -bone pain -constipation -tiredness -vomiting This list may not describe all possible side effects. Call your doctor for medical advice about side effects. You may report side effects to FDA at 1-800-FDA-1088. Where should I keep my medicine? This drug is given in a hospital or clinic and will not be stored at home. NOTE: This sheet is a summary. It may not cover all possible information. If you have questions about this medicine, talk to your doctor, pharmacist, or health care provider.  2018 Elsevier/Gold Standard (2016-02-19 17:49:34)  

## 2017-02-21 NOTE — Telephone Encounter (Signed)
Gave avs and calendar for October and November  °

## 2017-02-21 NOTE — Progress Notes (Signed)
Hematology and Oncology Follow Up Visit  Gregory [name] [id number] 1961-02-22 56 y.o. 02/21/2017 8:56 AM Gregory [name], [name], Fransico Him, MD   Principle Diagnosis: 56 year old gentleman with renal cell cancer diagnosed in August 2017. He was found to have pulmonary nodules and currently has metastatic disease.   Prior Therapy: He is status post radical nephrectomy done on 02/07/2016 at [facility]. 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 The measurements of the tumor is 9.9 x 9.0 x 9.0 cm. He did have small pulmonary nodules and a repeat scan on 03/25/2016 showed a 0.6 mm left lower lobe nodule previously measuring 3 mm. There is a 1 cm left lower lobe nodule previously was 0.5.  Current therapy:  Nivolumab 240 mg every 2 weeks started on 06/07/2016. This is his first-line therapy chosen because of cardiomyopathy. He is here for evaluation before the next treatment.  Interim History: Gregory [name] presents today for a follow-up visit. Since the last visit, he reports no major changes in his health. He continues to be on insulin for his diabetes although his blood sugar remain slightly elevated. He has resumed most activities of daily living without any decline. He continues to tolerate Nivolumab without any new side effects. He denied any pulmonary complaints including shortness of breath, dyspnea on exertion or cough. He did have a PSA checked by his primary care physician which was elevated.  He does not report any headaches or blurry vision, syncope or seizures. He does not report any fevers, chills, sweats or weight loss. His appetite is excellent and have gained weight. He does not report any chest pain, palpitation, orthopnea or leg edema. He does not report any cough, wheezing or hemoptysis. He does not report any nausea, vomiting or abdominal pain. He does not report any frequency urgency or hesitancy. Remaining review of systems  unremarkable.   Medications: I have reviewed the patient's current medications.  Current Outpatient Prescriptions  Medication Sig Dispense Refill  . blood glucose meter kit and supplies KIT Dispense based on patient and insurance preference. Use up to four times daily as directed. (FOR ICD-9 250.00, 250.01). 1 each 0  . carvedilol (COREG) 3.125 MG tablet TAKE 1 TABLET TWICE DAILY. 60 tablet 6  . cetirizine (ZYRTEC) 10 MG tablet Take 10 mg by mouth daily.    . insulin aspart (NOVOLOG FLEXPEN) 100 UNIT/ML FlexPen Add to meal coverage Sliding scale CBG 70 - 120: 0 units CBG 121 - 150: 1 unit,  CBG 151 - 200: 2 units,  CBG 201 - 250: 3 units,  CBG 251 - 300: 5 units,  CBG 301 - 350: 7 units,  CBG 351 - 400: 9 units   CBG > 400: 9 units and notify your MD 15 mL 11  . insulin aspart (NOVOLOG FLEXPEN) 100 UNIT/ML FlexPen Inject 9 Units into the skin 3 (three) times daily with meals. 15 mL 11  . Insulin Glargine (LANTUS) 100 UNIT/ML Solostar Pen Inject 20 Units into the skin at bedtime. 15 mL 3  . Insulin Pen Needle 32G X 5 MM MISC 1 each by Does not apply route at bedtime. For Solostar Pen 100 each 4  . Insulin Syringe-Needle U-100 (INSULIN SYRINGE 1CC/31GX5/16") 31G X 5/16" 1 ML MISC 1 each by Does not apply route 3 (three) times daily before meals. 100 each 6  . Iron-Folic [id number]-[id number] (FERRAPLUS 90) 90-1 MG TABS Take 1 tablet by mouth daily. 30 tablet 5  .  levothyroxine (SYNTHROID, LEVOTHROID) 25 MCG tablet Take 25 mcg by mouth daily before breakfast.     . PARoxetine (PAXIL-CR) 25 MG 24 hr tablet Take 1 tablet (25 mg total) by mouth daily. 90 tablet 3  . sacubitril-valsartan (ENTRESTO) 49-51 MG Take 1 tablet by mouth 2 (two) times daily. HOLD until follow-up with your MD 60 tablet 3  . thiamine (VITAMIN B-1) 100 MG tablet Take 1 tablet (100 mg total) by mouth daily. 90 tablet 1   No current facility-administered medications for this visit.      Allergies: No Known Allergies  Past  Medical History, Surgical history, Social history, and Family History were reviewed and updated.   Physical Exam: Blood pressure 118/80, pulse 96, temperature 98.5 F (36.9 C), temperature source Oral, resp. rate 17, height '6\' 2"'  (1.88 m), weight 233 lb 14.4 oz (106.1 kg), SpO2 99 %. ECOG: 0 General appearance: Well-appearing gentleman appeared without distress. Head: Normocephalic, without obvious abnormality no oral sores or thrush. Neck: no adenopathy Lymph nodes: Cervical, supraclavicular, and axillary nodes normal. Heart:regular rate and rhythm, S1, S2 normal, no murmur, click, rub or gallop Lung:chest clear, no wheezing, rales, normal symmetric air entry Abdomin: soft, non-tender, without masses or organomegaly no rebound or guarding. EXT:no erythema, induration, or nodules   Lab Results: Lab Results  Component Value Date   WBC 7.1 02/21/2017   HGB 12.7 (L) 02/21/2017   HCT 36.9 (L) 02/21/2017   MCV 99.8 (H) 02/21/2017   PLT 224 02/21/2017     Chemistry      Component Value Date/Time   NA 136 02/07/2017 0823   K 4.1 02/07/2017 0823   CL 107 01/26/2017 0533   CO2 26 02/07/2017 0823   BUN 16.6 02/07/2017 0823   CREATININE 1.3 02/07/2017 0823      Component Value Date/Time   CALCIUM 9.3 02/07/2017 0823   ALKPHOS 57 02/07/2017 0823   AST 14 02/07/2017 0823   ALT 10 02/07/2017 0823   BILITOT 0.54 02/07/2017 0823      Impression and Plan: 56 year old with the following issues:  1. 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 He is status post radical nephrectomy done on 02/07/2016 at [facility]. 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 The measurements of the tumor is 9.9 x 9.0 x 9.0 cm. He did have small pulmonary nodules   He is currently receiving Nivolumab 240 mg every 2 weeks started on 06/07/2016.   CT scan obtained in April 2018 at [facility] indicate stable disease.   CT  scan obtained on 12/02/2016 was reviewed and showed stable disease.  The plan is to continue with the current dose and schedule for the time being and he will have repeat imaging studies in October 2018 and [facility]. He has a visit on October 12 and if his scans at that time showed stable disease and he will continue Nivolumab every 2 weeks starting on 03/14/2017.  2. Diarrhea: He is no longer reporting any diarrhea at this time.  3. Cardiomyopathy: Continues to follow with cardiology and his cardiac medications are optimized. His EF continues to be close to 20%.  4. Thyroid function surveillance: He was started on Synthroid during his visit at [facility]. His last TSH was normal.  5. Diabetes: His currently on insulin and followed by his primary care physician as well as endocrinology. His blood sugar remains slightly elevated at this time he will have  dietary modifications as well.  6. Elevated PSA: He has a urology referral for further evaluation.  7. Follow-up: Will be in 3 weeks for his next Nivolumab treatment.   Zola Button, MD 9/28/20188:56 AM

## 2017-02-24 DIAGNOSIS — M25461 Effusion, right knee: Secondary | ICD-10-CM | POA: Diagnosis not present

## 2017-02-24 DIAGNOSIS — M1711 Unilateral primary osteoarthritis, right knee: Secondary | ICD-10-CM | POA: Diagnosis not present

## 2017-02-25 ENCOUNTER — Encounter: Payer: BLUE CROSS/BLUE SHIELD | Attending: Internal Medicine | Admitting: *Deleted

## 2017-02-25 DIAGNOSIS — Z794 Long term (current) use of insulin: Secondary | ICD-10-CM | POA: Diagnosis not present

## 2017-02-25 DIAGNOSIS — E109 Type 1 diabetes mellitus without complications: Secondary | ICD-10-CM | POA: Diagnosis not present

## 2017-02-25 DIAGNOSIS — E119 Type 2 diabetes mellitus without complications: Secondary | ICD-10-CM

## 2017-02-25 DIAGNOSIS — M25561 Pain in right knee: Secondary | ICD-10-CM | POA: Diagnosis not present

## 2017-02-25 DIAGNOSIS — Z713 Dietary counseling and surveillance: Secondary | ICD-10-CM | POA: Diagnosis not present

## 2017-02-25 DIAGNOSIS — IMO0001 Reserved for inherently not codable concepts without codable children: Secondary | ICD-10-CM

## 2017-02-25 NOTE — Patient Instructions (Signed)
Plan:  Aim for 4 Carb Choices per meal (60 grams) +/- 1 either way  Aim for 0-2 Carbs per snack if hungry  Include protein in moderation with your meals and snacks Consider reading food labels for Total Carbohydrate of foods Continue with your activity level daily as tolerated Continue checking BG at alternate times per day  Continue taking medication as directed by MD

## 2017-02-25 NOTE — Progress Notes (Signed)
Diabetes Self-Management Education  Visit Type: First/Initial  Appt. Start Time: 1400 Appt. End Time: 0903  02/25/2017  [name], identified by name and date of birth, is a 56 y.o. male with a diagnosis of Diabetes: Type 1. He was just diagnosed last month after being hospitalized with DKA. Also significant for Renal cancer with removal of one kidney earlier this year. He is here with his wife who participated in the visit. Patient's primary interest today is to learn about food choices with diabetes.  ASSESSMENT  There were no vitals taken for this visit.  Patient declined stating his weight at MD office as about 230 pounds.  There is no height or weight on file to calculate BMI.      Diabetes Self-Management Education - 02/25/17 1757      Visit Information   Visit Type First/Initial      Individualized Plan for Diabetes Self-Management Training:   Learning Objective:  Patient will have a greater understanding of diabetes self-management. Patient education plan is to attend individual and/or group sessions per assessed needs and concerns.   Plan:   Patient Instructions  Plan:  Aim for 4 Carb Choices per meal (60 grams) +/- 1 either way  Aim for 0-2 Carbs per snack if hungry  Include protein in moderation with your meals and snacks Consider reading food labels for Total Carbohydrate of foods Continue with your activity level daily as tolerated Continue checking BG at alternate times per day  Continue taking medication as directed by MD  Expected Outcomes:  Demonstrated interest in learning. Expect positive outcomes  Education material provided: Meal plan card, Snack sheet and Carbohydrate counting sheet  If problems or questions, patient to contact team via:  Phone  Future DSME appointment: 2 months

## 2017-03-02 ENCOUNTER — Other Ambulatory Visit (HOSPITAL_COMMUNITY): Payer: Self-pay | Admitting: Internal Medicine

## 2017-03-03 DIAGNOSIS — E109 Type 1 diabetes mellitus without complications: Secondary | ICD-10-CM | POA: Diagnosis not present

## 2017-03-03 DIAGNOSIS — Z794 Long term (current) use of insulin: Secondary | ICD-10-CM | POA: Diagnosis not present

## 2017-03-03 DIAGNOSIS — E039 Hypothyroidism, unspecified: Secondary | ICD-10-CM | POA: Diagnosis not present

## 2017-03-05 ENCOUNTER — Encounter (HOSPITAL_COMMUNITY): Payer: BLUE CROSS/BLUE SHIELD | Admitting: Internal Medicine

## 2017-03-06 DIAGNOSIS — C7802 Secondary malignant neoplasm of left lung: Secondary | ICD-10-CM | POA: Diagnosis not present

## 2017-03-06 DIAGNOSIS — K769 Liver disease, unspecified: Secondary | ICD-10-CM | POA: Diagnosis not present

## 2017-03-06 DIAGNOSIS — C641 Malignant neoplasm of right kidney, except renal pelvis: Secondary | ICD-10-CM | POA: Diagnosis not present

## 2017-03-06 DIAGNOSIS — Z794 Long term (current) use of insulin: Secondary | ICD-10-CM | POA: Diagnosis not present

## 2017-03-06 DIAGNOSIS — Z79899 Other long term (current) drug therapy: Secondary | ICD-10-CM | POA: Diagnosis not present

## 2017-03-06 DIAGNOSIS — Z7984 Long term (current) use of oral hypoglycemic drugs: Secondary | ICD-10-CM | POA: Diagnosis not present

## 2017-03-07 ENCOUNTER — Other Ambulatory Visit: Payer: BLUE CROSS/BLUE SHIELD

## 2017-03-07 ENCOUNTER — Ambulatory Visit: Payer: BLUE CROSS/BLUE SHIELD

## 2017-03-07 DIAGNOSIS — C7802 Secondary malignant neoplasm of left lung: Secondary | ICD-10-CM | POA: Diagnosis not present

## 2017-03-07 DIAGNOSIS — E0965 Drug or chemical induced diabetes mellitus with hyperglycemia: Secondary | ICD-10-CM | POA: Diagnosis not present

## 2017-03-07 DIAGNOSIS — C641 Malignant neoplasm of right kidney, except renal pelvis: Secondary | ICD-10-CM | POA: Diagnosis not present

## 2017-03-07 DIAGNOSIS — E1065 Type 1 diabetes mellitus with hyperglycemia: Secondary | ICD-10-CM | POA: Diagnosis not present

## 2017-03-07 DIAGNOSIS — Z794 Long term (current) use of insulin: Secondary | ICD-10-CM | POA: Diagnosis not present

## 2017-03-14 ENCOUNTER — Other Ambulatory Visit: Payer: BLUE CROSS/BLUE SHIELD

## 2017-03-14 ENCOUNTER — Ambulatory Visit: Payer: BLUE CROSS/BLUE SHIELD

## 2017-03-14 ENCOUNTER — Ambulatory Visit: Payer: BLUE CROSS/BLUE SHIELD | Admitting: Nurse Practitioner

## 2017-03-14 ENCOUNTER — Encounter: Payer: Self-pay | Admitting: Oncology

## 2017-03-14 ENCOUNTER — Ambulatory Visit (HOSPITAL_BASED_OUTPATIENT_CLINIC_OR_DEPARTMENT_OTHER): Payer: BLUE CROSS/BLUE SHIELD | Admitting: Oncology

## 2017-03-14 ENCOUNTER — Other Ambulatory Visit (HOSPITAL_BASED_OUTPATIENT_CLINIC_OR_DEPARTMENT_OTHER): Payer: BLUE CROSS/BLUE SHIELD

## 2017-03-14 ENCOUNTER — Ambulatory Visit (HOSPITAL_BASED_OUTPATIENT_CLINIC_OR_DEPARTMENT_OTHER): Payer: BLUE CROSS/BLUE SHIELD

## 2017-03-14 VITALS — BP 119/68 | HR 77 | Temp 98.7°F | Resp 18 | Ht 74.0 in | Wt 229.5 lb

## 2017-03-14 DIAGNOSIS — N390 Urinary tract infection, site not specified: Secondary | ICD-10-CM | POA: Diagnosis not present

## 2017-03-14 DIAGNOSIS — R300 Dysuria: Secondary | ICD-10-CM

## 2017-03-14 DIAGNOSIS — Z5112 Encounter for antineoplastic immunotherapy: Secondary | ICD-10-CM | POA: Insufficient documentation

## 2017-03-14 DIAGNOSIS — D509 Iron deficiency anemia, unspecified: Secondary | ICD-10-CM | POA: Insufficient documentation

## 2017-03-14 DIAGNOSIS — C7800 Secondary malignant neoplasm of unspecified lung: Secondary | ICD-10-CM | POA: Diagnosis not present

## 2017-03-14 DIAGNOSIS — C649 Malignant neoplasm of unspecified kidney, except renal pelvis: Secondary | ICD-10-CM

## 2017-03-14 DIAGNOSIS — E119 Type 2 diabetes mellitus without complications: Secondary | ICD-10-CM | POA: Diagnosis not present

## 2017-03-14 DIAGNOSIS — D649 Anemia, unspecified: Secondary | ICD-10-CM

## 2017-03-14 DIAGNOSIS — I429 Cardiomyopathy, unspecified: Secondary | ICD-10-CM | POA: Diagnosis not present

## 2017-03-14 DIAGNOSIS — Z794 Long term (current) use of insulin: Secondary | ICD-10-CM

## 2017-03-14 DIAGNOSIS — R972 Elevated prostate specific antigen [PSA]: Secondary | ICD-10-CM | POA: Diagnosis not present

## 2017-03-14 DIAGNOSIS — C679 Malignant neoplasm of bladder, unspecified: Secondary | ICD-10-CM

## 2017-03-14 LAB — CBC WITH DIFFERENTIAL/PLATELET
BASO%: 0.9 % (ref 0.0–2.0)
Basophils Absolute: 0.1 10*3/uL (ref 0.0–0.1)
EOS ABS: 0.3 10*3/uL (ref 0.0–0.5)
EOS%: 3.3 % (ref 0.0–7.0)
HCT: 31.5 % — ABNORMAL LOW (ref 38.4–49.9)
HEMOGLOBIN: 10.4 g/dL — AB (ref 13.0–17.1)
LYMPH%: 11.9 % — AB (ref 14.0–49.0)
MCH: 31.6 pg (ref 27.2–33.4)
MCHC: 33.2 g/dL (ref 32.0–36.0)
MCV: 95.2 fL (ref 79.3–98.0)
MONO#: 0.8 10*3/uL (ref 0.1–0.9)
MONO%: 8.8 % (ref 0.0–14.0)
NEUT%: 75.1 % — ABNORMAL HIGH (ref 39.0–75.0)
NEUTROS ABS: 6.7 10*3/uL — AB (ref 1.5–6.5)
Platelets: 460 10*3/uL — ABNORMAL HIGH (ref 140–400)
RBC: 3.31 10*6/uL — AB (ref 4.20–5.82)
RDW: 16.2 % — ABNORMAL HIGH (ref 11.0–14.6)
WBC: 8.9 10*3/uL (ref 4.0–10.3)
lymph#: 1.1 10*3/uL (ref 0.9–3.3)

## 2017-03-14 LAB — URINALYSIS, MICROSCOPIC - CHCC
Bilirubin (Urine): NEGATIVE
GLUCOSE UR CHCC: NEGATIVE mg/dL
KETONES: NEGATIVE mg/dL
Nitrite: NEGATIVE
Protein: 30 mg/dL
SPECIFIC GRAVITY, URINE: 1.005 (ref 1.003–1.035)
Urobilinogen, UR: 0.2 mg/dL (ref 0.2–1)
pH: 7 (ref 4.6–8.0)

## 2017-03-14 LAB — COMPREHENSIVE METABOLIC PANEL
ALBUMIN: 2.7 g/dL — AB (ref 3.5–5.0)
ALK PHOS: 56 U/L (ref 40–150)
ALT: 7 U/L (ref 0–55)
AST: 11 U/L (ref 5–34)
Anion Gap: 8 mEq/L (ref 3–11)
BILIRUBIN TOTAL: 0.4 mg/dL (ref 0.20–1.20)
BUN: 11.9 mg/dL (ref 7.0–26.0)
CO2: 32 meq/L — AB (ref 22–29)
CREATININE: 1.1 mg/dL (ref 0.7–1.3)
Calcium: 9 mg/dL (ref 8.4–10.4)
Chloride: 100 mEq/L (ref 98–109)
EGFR: >60 mL/min/{1.73_m2} (ref >60)
GLUCOSE: 100 mg/dL (ref 70–140)
Potassium: 4.5 mEq/L (ref 3.5–5.1)
SODIUM: 139 meq/L (ref 136–145)
TOTAL PROTEIN: 6.7 g/dL (ref 6.4–8.3)

## 2017-03-14 LAB — TSH: TSH: 3.599 m(IU)/L (ref 0.320–4.118)

## 2017-03-14 MED ORDER — NIVOLUMAB CHEMO INJECTION 100 MG/10ML
240.0000 mg | Freq: Once | INTRAVENOUS | Status: AC
  Administered 2017-03-14: 240 mg via INTRAVENOUS
  Filled 2017-03-14: qty 24

## 2017-03-14 MED ORDER — LEVOTHYROXINE SODIUM 25 MCG PO TABS: 25.0000 ug | ORAL_TABLET | Freq: Every day | ORAL | 2 refills | Status: DC

## 2017-03-14 MED ORDER — SODIUM CHLORIDE 0.9 % IV SOLN
Freq: Once | INTRAVENOUS | Status: AC
  Administered 2017-03-14: 13:00:00 via INTRAVENOUS

## 2017-03-14 MED ORDER — CIPROFLOXACIN HCL 500 MG PO TABS
500.0000 mg | ORAL_TABLET | Freq: Two times a day (BID) | ORAL | 0 refills | Status: DC
Start: 2017-03-14 — End: 2017-05-01

## 2017-03-14 NOTE — Progress Notes (Signed)
[facility] Follow up:    [name], MD [address] 1st [address] [place] [address]   DIAGNOSIS: 56 year old gentleman with renal cell cancer diagnosed in August 2017. He was found to have pulmonary nodules and currently has metastatic disease.  SUMMARY OF ONCOLOGIC HISTORY:  No history exists.   PRIOR THERAPY: He is status post radical nephrectomy done on 02/07/2016 at [facility]. 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 The measurements of the tumor is 9.9 x 9.0 x 9.0 cm. He did have small pulmonary nodules and a repeat scan on 03/25/2016 showed a 0.6 mm left lower lobe nodule previously measuring 3 mm. There is a 1 cm left lower lobe nodule previously was 0.5.  CURRENT THERAPY: Nivolumab 240 mg every 2 weeks started on 06/07/2016. This is his first-line therapy chosen because of cardiomyopathy. He is here for evaluation before the next treatment.  INTERVAL HISTORY: TAYTEN BERGDOLL 56 y.o. male returns for routine follow-up by himself. Mr. Molina presents today for a follow-up visit. Since the last visit, he reports no major changes in his health. He was seen at [facility] for routine follow-up. He had restaging imaging performed down there. I am unable to see the full office progress note, but am able to view the scan results. MRI of the brain was negative for brain metastasis. There is no evidence of bone metastases on the bone scan. CT of the chest abdomen and pelvis show slightly increased left lower lobe pulmonary parenchymal metastasis and small enhancing lesions in the liver are indeterminate. The patient tells me that he has a referral to cardiothoracic surgery pending at [facility] to discuss possible resection. He will see them in mid November. He continues to be on insulin for his diabetes although his blood sugar remain slightly elevated. He has resumed most activities of daily living without any  decline. He continues to tolerate Nivolumab without any new side effects. He denied any pulmonary complaints including shortness of breath, dyspnea on exertion or cough.   He does not report any headaches or blurry vision, syncope or seizures. He does not report any fevers, chills, sweats or weight loss. His appetite is excellent and has gained weight. He does not report any chest pain, palpitations, orthopnea or leg edema. He does not report any cough, wheezing or hemoptysis. He does not report any nausea, vomiting or abdominal pain. He does not report any frequency urgency or hesitancy. Denies hematuria. Remaining review of systems unremarkable.    Patient Active Problem List   Diagnosis Date Noted  . Encounter for antineoplastic immunotherapy 03/14/2017  . Manifestations of thiamine deficiency 02/20/2017  . Absolute anemia 02/17/2017  . Need for hepatitis C screening test 02/17/2017  . PSA elevation 02/17/2017  . Other dietary vitamin B12 deficiency anemia 02/17/2017  . Chronic systolic CHF (congestive heart failure) ([place]) 01/24/2017  . Essential hypertension 01/23/2017  . IDDM (insulin dependent diabetes mellitus) ([place]) 01/13/2017  . Primary malignant neoplasm of kidney with metastasis from kidney to other site [facility]) 05/29/2016  . Obesity (BMI 30-39.9) 08/22/2011  . Routine health maintenance 08/22/2011  . Pure hyperglyceridemia 06/21/2007  . ANXIETY DISORDER, ACUTE 06/21/2007  . MACULAR DEGENERATION, RIGHT EYE 06/21/2007  . Malignant essential hypertension with congestive heart failure ([place]) 06/21/2007  . ALLERGIC RHINITIS 06/21/2007    has No Known Allergies.  MEDICAL HISTORY: Past Medical History:  Diagnosis Date  . Allergy   .  Anxiety   . History of gastrointestinal hemorrhage   . Hypertension   . Hypertriglyceridemia   . Macular degeneration    lasar surgery- Dr Zigmund Daniel  . Plantar warts   . PONV (postoperative nausea and vomiting)     SURGICAL HISTORY: Past  Surgical History:  Procedure Laterality Date  . CARDIAC CATHETERIZATION N/A 04/19/2016   Procedure: Left Heart Cath and Coronary Angiography;  Surgeon: Jolaine Artist, MD;  Location: [facility];  Service: Cardiovascular;  Laterality: N/A;  . COLONOSCOPY  03/22/2005  . COLOSTOMY    . EYE SURGERY Right    laser surgery, right eye macular degeneration  . orif fx fibula    . sebaceous cyst excision  1985  . TONSILLECTOMY    . TOTAL SHOULDER ARTHROPLASTY Left 11/11/2014   Procedure: LEFT TOTAL SHOULDER ARTHROPLASTY;  Surgeon: Netta Cedars, MD;  Location: [facility];  Service: Orthopedics;  Laterality: Left;  Marland Kitchen VASECTOMY      SOCIAL HISTORY: Social History   Social History  . Marital status: Married    Spouse name: N/A  . Number of children: N/A  . Years of education: N/A   Occupational History  . Not on file.   Social History Main Topics  . Smoking status: Never Smoker  . Smokeless tobacco: Never Used  . Alcohol use No     Comment: social  . Drug use: No  . Sexual activity: Not on file   Other Topics Concern  . 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     FAMILY HISTORY: Family History  Problem Relation Age of Onset  . Hypertension Mother   . Bipolar disorder Mother   . Cancer Mother 80       Brain  . Other Father        brain tumor  . Colon cancer Neg Hx   . Colon polyps Neg Hx   . Rectal cancer Neg Hx   . Stomach cancer Neg Hx   . Early death Neg Hx   . Hyperlipidemia Neg Hx   . Kidney disease Neg Hx     Review of Systems  Constitutional: Negative.   HENT:  Negative.   Eyes: Negative.   Respiratory: Negative.   Cardiovascular: Negative.   Gastrointestinal: Negative.   Genitourinary: Negative.    Musculoskeletal: Negative.   Skin: Negative.   Neurological: Negative.    Hematological: Negative.   Psychiatric/Behavioral: Negative.       PHYSICAL EXAMINATION  ECOG PERFORMANCE STATUS: 1 - Symptomatic but completely ambulatory  Vitals:   03/14/17 1134  BP: 119/68  Pulse: 77  Resp: 18  Temp: 98.7 F (37.1 C)  SpO2: 99%    Physical Exam  Constitutional: He is oriented to person, place, and time and well-developed, well-nourished, and in no distress. No distress.  HENT:  Head: Normocephalic and atraumatic.  Mouth/Throat: Oropharynx is clear and moist. No oropharyngeal exudate.  Eyes: Conjunctivae are normal. Right eye exhibits no discharge. Left eye exhibits no discharge. No scleral icterus.  Neck: Normal range of motion. Neck supple.  Cardiovascular: Normal rate, regular rhythm, normal heart sounds and intact distal pulses.   Pulmonary/Chest: Effort normal and breath sounds normal. No respiratory distress. He has no wheezes. He has no rales.  Abdominal: Soft. Bowel  sounds are normal. He exhibits no distension and no mass. There is no tenderness.  Musculoskeletal: Normal range of motion. He exhibits no edema.  Lymphadenopathy:    He has no cervical adenopathy.  Neurological: He is alert and oriented to person, place, and time. He exhibits normal muscle tone. Coordination normal.  Skin: Skin is warm and dry. No rash noted. He is not diaphoretic. No erythema. No pallor.  Psychiatric: Mood, memory, affect and judgment normal.  Vitals reviewed.   LABORATORY DATA:  CBC    Component Value Date/Time   WBC 8.9 03/14/2017 1114   WBC 4.8 01/25/2017 0625   RBC 3.31 (L) 03/14/2017 1114   RBC 3.92 (L) 01/25/2017 0625   HGB 10.4 (L) 03/14/2017 1114   HCT 31.5 (L) 03/14/2017 1114   PLT 460 (H) 03/14/2017 1114   MCV 95.2 03/14/2017 1114   MCH 31.6 03/14/2017 1114   MCH 32.9 01/25/2017 0625   MCHC 33.2 03/14/2017 1114   MCHC 35.2 01/25/2017 0625   RDW 16.2 (H) 03/14/2017 1114   LYMPHSABS 1.1 03/14/2017 1114   MONOABS 0.8 03/14/2017 1114   EOSABS  0.3 03/14/2017 1114   BASOSABS 0.1 03/14/2017 1114    CMP     Component Value Date/Time   NA 139 03/14/2017 1114   K 4.5 03/14/2017 1114   CL 107 01/26/2017 0533   CO2 32 (H) 03/14/2017 1114   GLUCOSE 100 03/14/2017 1114   GLUCOSE 98 05/23/2006 0733   BUN 11.9 03/14/2017 1114   CREATININE 1.1 03/14/2017 1114   CALCIUM 9.0 03/14/2017 1114   PROT 6.7 03/14/2017 1114   ALBUMIN 2.7 (L) 03/14/2017 1114   AST 11 03/14/2017 1114   ALT 7 03/14/2017 1114   ALKPHOS 56 03/14/2017 1114   BILITOT 0.40 03/14/2017 1114   GFRNONAA >60 01/26/2017 0533   GFRAA >60 01/26/2017 0533     RADIOGRAPHIC STUDIES:   Imaging study results from [facility] reviewed in care everywhere. Pertinent findings noted in the history of present illness.  ASSESSMENT and THERAPY PLAN:   Impression and Plan: 56 year old with the following issues:  1. 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 He is status post radical nephrectomy done on 02/07/2016 at [facility]. 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 The measurements of the tumor is 9.9 x 9.0 x 9.0 cm. He did have small pulmonary nodules   He is currently receiving Nivolumab 240 mg every 2 weeks started on 06/07/2016.   CT scan obtained in April 2018 at [facility] indicate stable disease.   CT scan obtained on 12/02/2016 was reviewed and showed stable disease.  The plan is to continue with the current dose and schedule for the time being. Scans [facility] are overall stable. There was slight enlargement and when the pulmonary nodules and he is being considered for resection. A consult with cardiothoracic surgery scheduled for November 2018.  The patient is to continue Nivolumab every 2 weeks. He will proceed with today's dose as scheduled.  2. Diarrhea: He is no longer reporting any diarrhea at this time.  3. Cardiomyopathy: Continues to follow with cardiology  and his cardiac medications are optimized. His EF continues to be close to 20%.  4. Thyroid function surveillance: He was started on Synthroid during his visit at [facility]. tSH is normal today. I have refilled levothyroxine 25 g daily to his local pharmacy.  5. Diabetes: His currently on insulin and followed  by his primary care physician as well as endocrinology. His blood sugar remains slightly elevated at this time he will have dietary modifications as well.  6. Elevated PSA: He has a urology referral for further evaluation.  7. Anemia: Unclear etiology. The patient has no obvious blood loss. He does express that he had microscopic hematuria in the past. Urinalysis was checked today. The patient has a trace amount of blood which is stable compared to his previous urinalysis.  Recheck a CBC his next visit. He is to call us if he notices any bleeding.  8. Urinary tract infection: He is noted to have moderate bacteria and a large amount of leukocyte esterase on his urinalysis. I will prescribe Cipro 500 mg twice a day for one week. Urine culture is pending.  9. Follow-up: Will be in 2 weeks for his next Nivolumab treatment.  Orders Placed This Encounter  Procedures  . Urine Culture    Standing Status:   Future    Number of Occurrences:   1    Standing Expiration Date:   03/14/2018  . Urinalysis, Microscopic - [facility]    Standing Status:   Future    Number of Occurrences:   1    Standing Expiration Date:   03/14/2018    All questions were answered. The patient knows to call the clinic with any problems, questions or concerns. We can certainly see the patient much sooner if necessary.  Mikey Bussing, NP 03/14/2017

## 2017-03-14 NOTE — Patient Instructions (Signed)
[facility] Discharge Instructions for Patients Receiving Chemotherapy  Today you received the following chemotherapy agents: Nivolumab. If you develop diarrhea, take Imodium. Call the office for diarrhea that does not respond to Imodium.  If you develop nausea and vomiting that is not controlled by your nausea medication, call the clinic.   BELOW ARE SYMPTOMS THAT SHOULD BE REPORTED IMMEDIATELY:  *FEVER GREATER THAN 100.5 F  *CHILLS WITH OR WITHOUT FEVER  NAUSEA AND VOMITING THAT IS NOT CONTROLLED WITH YOUR NAUSEA MEDICATION  *UNUSUAL SHORTNESS OF BREATH  *UNUSUAL BRUISING OR BLEEDING  TENDERNESS IN MOUTH AND THROAT WITH OR WITHOUT PRESENCE OF ULCERS  *URINARY PROBLEMS  *BOWEL PROBLEMS  UNUSUAL RASH Items with * indicate a potential emergency and should be followed up as soon as possible.  Feel free to call the clinic should you have any questions or concerns. The clinic phone number is [phone].  Please show the [place] at check-in to the Emergency Department and triage nurse.

## 2017-03-15 LAB — URINE CULTURE

## 2017-03-19 ENCOUNTER — Encounter (HOSPITAL_COMMUNITY): Payer: Self-pay | Admitting: Emergency Medicine

## 2017-03-19 ENCOUNTER — Encounter (HOSPITAL_COMMUNITY): Payer: Self-pay | Admitting: *Deleted

## 2017-03-19 NOTE — Progress Notes (Signed)
Spoke with pt for pre-op call. Pt states he sees Dr. Haroldine Laws for low ejection fraction. Pt states that he is taking Entresto to treat it, but no one knows what has caused the low ejection fraction. Pt is a type 2 diabetic, last A1C was 7.5 on 01/24/17. Pt states his fasting blood sugar is anywhere between 130-220. Instructed pt to take 1/2 of his regular dose of Lanuts Insulin this evening (will take 17 units). Instructed pt to check his blood sugar in the AM when he gets up and every two hours prior to leaving for the hospital. If blood sugar is >220 take 1/2 of usual correction dose of Novolog insulin. If blood sugar is 70 or below, treat with 1/2 cup of clear juice (apple or cranberry) and recheck blood sugar 15 minutes after drinking juice. If blood sugar continues to be 70 or below, call the Short Stay department and ask to speak to a nurse. Pt voiced understanding.  Pt states he has device under his arm that checks his blood sugar on a routine basis.

## 2017-03-19 NOTE — Progress Notes (Signed)
Anesthesia Chart Review:  Pt is a same day work up.   Pt is a 56 year old male scheduled for R Knee arthroscopy with medial meniscectomy and lateral meniscectomy and chondroplasty on 03/20/2017 with Gregory Bond, MD  - PCP is Gregory Calico, MD - Endocrinologist is Gregory Rend, MD - Oncologist is Gregory Button, MD  - HF cardiologist is Gregory Bickers, MD. Last office visit 09/12/16. F/u in 4 months advised, but this has not happened.   PMH includes:  Nonischemic cardiomyopathy, HTN, DM, dyslipidemia, anemia, renal cell carcinoma (s/p radical nephrectomy 02/07/16 at [facility]). Never smoker. BMI 29.5. S/p L shoulder arthroplasty 11/11/14  - Hospitalized 8/30-01/26/17 for DKA, acute renal failure.   Labs will be obtained DOS.   EKG 01/24/17: RSR' in V1 or V2, probably normal variant. Borderline T wave abnormalities. Prolonged QT interval  Echo 09/12/16:  - Left ventricle: The cavity size was normal. Systolic function was severely reduced. The estimated ejection fraction was in the range of 25% to 30%. Diffuse hypokinesis. Doppler parameters are consistent with abnormal left ventricular relaxation (grade 1 diastolic dysfunction). - Aortic valve: Trileaflet; mildly thickened, mildly calcified leaflets. There was trivial regurgitation. - Mitral valve: Calcified annulus. There was trivial regurgitation. - Left atrium: The atrium was mildly dilated. Anterior-posterior dimension: 44 mm. - Right atrium: The atrium was mildly dilated.  Cardiac MRI 05/30/16:  1.  Mild to moderate LV dilation with EF 28%, diffuse hypokinesis. 2.  Normal RV size with mildly decreased systolic function. 3. Delayed enhancement images not adequate for diagnosis. I will check with MRI tech to see if there are additional images that were not sent and addend study if available. ADDENDUM: - Study was resent with additional images not initially present. - Delayed enhancement imaging showed no myocardial late gadolinium  enhancement (LGE), so no definite evidence for prior MI, myocarditis, or infiltrative disease.  Cardiac cath 04/19/16:  1. 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 Otherwise normal coronaries 3. Nonischemic, dilated cardiomyopathy with EF 30-35% and global hypokinesis - Plan: Cardiomyopathy well out of proportion to CAD. Given lack of angina and fact that RCA is a small to moderate-sized codominant vessel will treat medically. Proceed with cMRI to further assess EF and look for other causes of NICM.   Reviewed case with Dr. Gifford Shave.  Pt will need cardiac clearance before surgery. I notified Sherri in Dr. Luanna Cole office.   Gregory Cass, FNP-BC [facility] Short Stay Surgical Center/Anesthesiology Phone: [phone] 03/19/2017 4:07 PM

## 2017-03-20 ENCOUNTER — Ambulatory Visit (HOSPITAL_COMMUNITY): Payer: BLUE CROSS/BLUE SHIELD | Admitting: Anesthesiology

## 2017-03-20 ENCOUNTER — Encounter (HOSPITAL_COMMUNITY)
Admission: RE | Disposition: A | Discharge status: Home / Self Care | Payer: Self-pay | Source: Ambulatory Visit | Attending: Orthopedic Surgery

## 2017-03-20 ENCOUNTER — Ambulatory Visit (HOSPITAL_COMMUNITY)
Admission: RE | Admit: 2017-03-20 | Discharge: 2017-03-20 | Disposition: A | Discharge status: Home / Self Care | Payer: BLUE CROSS/BLUE SHIELD | Source: Ambulatory Visit | Attending: Orthopedic Surgery | Admitting: Orthopedic Surgery

## 2017-03-20 ENCOUNTER — Encounter (HOSPITAL_COMMUNITY): Payer: Self-pay | Admitting: *Deleted

## 2017-03-20 DIAGNOSIS — Z794 Long term (current) use of insulin: Secondary | ICD-10-CM | POA: Diagnosis not present

## 2017-03-20 DIAGNOSIS — Z96612 Presence of left artificial shoulder joint: Secondary | ICD-10-CM | POA: Insufficient documentation

## 2017-03-20 DIAGNOSIS — X58XXXA Exposure to other specified factors, initial encounter: Secondary | ICD-10-CM | POA: Insufficient documentation

## 2017-03-20 DIAGNOSIS — Z85528 Personal history of other malignant neoplasm of kidney: Secondary | ICD-10-CM | POA: Insufficient documentation

## 2017-03-20 DIAGNOSIS — F419 Anxiety disorder, unspecified: Secondary | ICD-10-CM | POA: Insufficient documentation

## 2017-03-20 DIAGNOSIS — D649 Anemia, unspecified: Secondary | ICD-10-CM | POA: Diagnosis not present

## 2017-03-20 DIAGNOSIS — I509 Heart failure, unspecified: Secondary | ICD-10-CM | POA: Diagnosis not present

## 2017-03-20 DIAGNOSIS — I428 Other cardiomyopathies: Secondary | ICD-10-CM | POA: Insufficient documentation

## 2017-03-20 DIAGNOSIS — M1711 Unilateral primary osteoarthritis, right knee: Secondary | ICD-10-CM | POA: Diagnosis present

## 2017-03-20 DIAGNOSIS — M94261 Chondromalacia, right knee: Secondary | ICD-10-CM | POA: Insufficient documentation

## 2017-03-20 DIAGNOSIS — E1140 Type 2 diabetes mellitus with diabetic neuropathy, unspecified: Secondary | ICD-10-CM | POA: Insufficient documentation

## 2017-03-20 DIAGNOSIS — Z79899 Other long term (current) drug therapy: Secondary | ICD-10-CM | POA: Insufficient documentation

## 2017-03-20 DIAGNOSIS — S83281A Other tear of lateral meniscus, current injury, right knee, initial encounter: Secondary | ICD-10-CM | POA: Diagnosis not present

## 2017-03-20 DIAGNOSIS — Z905 Acquired absence of kidney: Secondary | ICD-10-CM | POA: Insufficient documentation

## 2017-03-20 DIAGNOSIS — S83231A Complex tear of medial meniscus, current injury, right knee, initial encounter: Secondary | ICD-10-CM | POA: Diagnosis present

## 2017-03-20 DIAGNOSIS — S83271A Complex tear of lateral meniscus, current injury, right knee, initial encounter: Secondary | ICD-10-CM | POA: Diagnosis present

## 2017-03-20 DIAGNOSIS — S83241A Other tear of medial meniscus, current injury, right knee, initial encounter: Secondary | ICD-10-CM | POA: Insufficient documentation

## 2017-03-20 DIAGNOSIS — I110 Hypertensive heart disease with heart failure: Secondary | ICD-10-CM | POA: Diagnosis not present

## 2017-03-20 DIAGNOSIS — I2510 Atherosclerotic heart disease of native coronary artery without angina pectoris: Secondary | ICD-10-CM | POA: Diagnosis not present

## 2017-03-20 HISTORY — DX: Complex tear of medial meniscus, current injury, right knee, initial encounter: S83.231A

## 2017-03-20 HISTORY — DX: Unilateral primary osteoarthritis, right knee: M17.11

## 2017-03-20 HISTORY — DX: Polyneuropathy, unspecified: G62.9

## 2017-03-20 HISTORY — PX: CHONDROPLASTY: SHX5177

## 2017-03-20 HISTORY — DX: Malignant neoplasm of unspecified kidney, except renal pelvis: C64.9

## 2017-03-20 HISTORY — DX: Complex tear of lateral meniscus, current injury, right knee, initial encounter: S83.271A

## 2017-03-20 HISTORY — PX: KNEE ARTHROSCOPY WITH MEDIAL MENISECTOMY: SHX5651

## 2017-03-20 HISTORY — DX: Type 2 diabetes mellitus without complications: E11.9

## 2017-03-20 HISTORY — DX: Anemia, unspecified: D64.9

## 2017-03-20 HISTORY — DX: Malignant (primary) neoplasm, unspecified: C80.1

## 2017-03-20 HISTORY — DX: Other cardiomyopathies: I42.8

## 2017-03-20 LAB — BASIC METABOLIC PANEL
ANION GAP: 10 (ref 5–15)
BUN: 16 mg/dL (ref 6–20)
CO2: 26 mmol/L (ref 22–32)
Calcium: 8.9 mg/dL (ref 8.9–10.3)
Chloride: 103 mmol/L (ref 101–111)
Creatinine, Ser: 1.36 mg/dL — ABNORMAL HIGH (ref 0.61–1.24)
GFR calc Af Amer: >60 mL/min (ref >60)
GFR, EST NON AFRICAN AMERICAN: 56 mL/min — AB (ref >60)
Glucose, Bld: 232 mg/dL — ABNORMAL HIGH (ref 65–99)
POTASSIUM: 4.2 mmol/L (ref 3.5–5.1)
SODIUM: 139 mmol/L (ref 135–145)

## 2017-03-20 LAB — GRAM STAIN

## 2017-03-20 LAB — GLUCOSE, CAPILLARY
Glucose-Capillary: 194 mg/dL — ABNORMAL HIGH (ref 65–99)
Glucose-Capillary: 184 mg/dL — ABNORMAL HIGH (ref 65–99)
Glucose-Capillary: 135 mg/dL — ABNORMAL HIGH (ref 65–99)

## 2017-03-20 SURGERY — ARTHROSCOPY, KNEE, WITH MEDIAL MENISCECTOMY
Anesthesia: General | Site: Knee | Laterality: Right

## 2017-03-20 SURGERY — ARTHROSCOPY, KNEE, WITH MEDIAL MENISCECTOMY
Anesthesia: Choice | Site: Knee | Laterality: Right

## 2017-03-20 MED ORDER — PROMETHAZINE HCL 25 MG/ML IJ SOLN: 6.2500 mg | INTRAMUSCULAR | Status: DC | PRN

## 2017-03-20 MED ORDER — PROPOFOL 10 MG/ML IV BOLUS
INTRAVENOUS | Status: DC | PRN
  Administered 2017-03-20: 150 mg via INTRAVENOUS

## 2017-03-20 MED ORDER — ONDANSETRON HCL 4 MG/2ML IJ SOLN
INTRAMUSCULAR | Status: DC | PRN
  Administered 2017-03-20: 4 mg via INTRAVENOUS

## 2017-03-20 MED ORDER — MIDAZOLAM HCL 5 MG/5ML IJ SOLN
INTRAMUSCULAR | Status: DC | PRN
  Administered 2017-03-20: 2 mg via INTRAVENOUS

## 2017-03-20 MED ORDER — FENTANYL CITRATE (PF) 250 MCG/5ML IJ SOLN
INTRAMUSCULAR | Status: DC | PRN
  Administered 2017-03-20: 100 ug via INTRAVENOUS
  Administered 2017-03-20: 50 ug via INTRAVENOUS

## 2017-03-20 MED ORDER — SCOPOLAMINE 1 MG/3DAYS TD PT72
MEDICATED_PATCH | TRANSDERMAL | Status: DC | PRN
  Administered 2017-03-20: 1 via TRANSDERMAL

## 2017-03-20 MED ORDER — ONDANSETRON HCL 4 MG PO TABS: 4.0000 mg | ORAL_TABLET | Freq: Three times a day (TID) | ORAL | 0 refills | Status: DC | PRN

## 2017-03-20 MED ORDER — RIVAROXABAN 10 MG PO TABS: 10.0000 mg | ORAL_TABLET | Freq: Every day | ORAL | 0 refills | Status: DC

## 2017-03-20 MED ORDER — DIPHENHYDRAMINE HCL 50 MG/ML IJ SOLN
INTRAMUSCULAR | Status: DC | PRN
  Administered 2017-03-20: 12.5 mg via INTRAVENOUS

## 2017-03-20 MED ORDER — DEXAMETHASONE SODIUM PHOSPHATE 10 MG/ML IJ SOLN
INTRAMUSCULAR | Status: DC | PRN
  Administered 2017-03-20: 4 mg via INTRAVENOUS

## 2017-03-20 MED ORDER — BUPIVACAINE HCL 0.5 % IJ SOLN
INTRAMUSCULAR | Status: DC | PRN
  Administered 2017-03-20: 20 mL

## 2017-03-20 MED ORDER — SCOPOLAMINE 1 MG/3DAYS TD PT72
MEDICATED_PATCH | TRANSDERMAL | Status: AC
  Filled 2017-03-20: qty 1

## 2017-03-20 MED ORDER — FENTANYL CITRATE (PF) 250 MCG/5ML IJ SOLN
INTRAMUSCULAR | Status: AC
  Filled 2017-03-20: qty 5

## 2017-03-20 MED ORDER — HYDROCODONE-ACETAMINOPHEN 5-325 MG PO TABS: 1.0000 | ORAL_TABLET | Freq: Four times a day (QID) | ORAL | 0 refills | Status: DC | PRN

## 2017-03-20 MED ORDER — SODIUM CHLORIDE 0.9 % IR SOLN
Status: DC | PRN
  Administered 2017-03-20 (×2): 3000 mL

## 2017-03-20 MED ORDER — PHENYLEPHRINE HCL 10 MG/ML IJ SOLN
INTRAMUSCULAR | Status: DC | PRN
  Administered 2017-03-20 (×4): 120 ug via INTRAVENOUS

## 2017-03-20 MED ORDER — CEFAZOLIN SODIUM-DEXTROSE 2-4 GM/100ML-% IV SOLN
2.0000 g | INTRAVENOUS | Status: AC
  Administered 2017-03-20: 2 g via INTRAVENOUS
  Filled 2017-03-20: qty 100

## 2017-03-20 MED ORDER — FENTANYL CITRATE (PF) 100 MCG/2ML IJ SOLN: 25.0000 ug | INTRAMUSCULAR | Status: DC | PRN

## 2017-03-20 MED ORDER — TRAMADOL HCL 50 MG PO TABS: 50.0000 mg | ORAL_TABLET | Freq: Four times a day (QID) | ORAL | 0 refills | Status: DC | PRN

## 2017-03-20 MED ORDER — BUPIVACAINE HCL (PF) 0.5 % IJ SOLN
INTRAMUSCULAR | Status: AC
  Filled 2017-03-20: qty 30

## 2017-03-20 MED ORDER — SENNA-DOCUSATE SODIUM 8.6-50 MG PO TABS: 2.0000 | ORAL_TABLET | Freq: Every day | ORAL | 1 refills | Status: DC

## 2017-03-20 MED ORDER — MIDAZOLAM HCL 2 MG/2ML IJ SOLN
INTRAMUSCULAR | Status: AC
  Filled 2017-03-20: qty 2

## 2017-03-20 MED ORDER — LACTATED RINGERS IV SOLN
INTRAVENOUS | Status: DC
  Administered 2017-03-20: 11:00:00 via INTRAVENOUS

## 2017-03-20 SURGICAL SUPPLY — 48 items
BANDAGE ACE 6X5 VEL STRL LF (GAUZE/BANDAGES/DRESSINGS) IMPLANT
BANDAGE ESMARK 6X9 LF (GAUZE/BANDAGES/DRESSINGS) IMPLANT
BLADE CLIPPER SURG (BLADE) IMPLANT
BLADE CUTTER GATOR 3.5 (BLADE) ×1 IMPLANT
BLADE SURG 11 STRL SS (BLADE) IMPLANT
BNDG CMPR 9X6 STRL LF SNTH (GAUZE/BANDAGES/DRESSINGS) ×1
BNDG ESMARK 6X9 LF (GAUZE/BANDAGES/DRESSINGS) ×2
COVER SURGICAL LIGHT HANDLE (MISCELLANEOUS) ×2 IMPLANT
CUFF TOURNIQUET SINGLE 34IN LL (TOURNIQUET CUFF) ×1 IMPLANT
CUTTER MENISCUS 3.5MM 6/BX (BLADE) IMPLANT
DRAPE ARTHROSCOPY W/POUCH 114 (DRAPES) ×2 IMPLANT
DRAPE U-SHAPE 47X51 STRL (DRAPES) ×2 IMPLANT
DRSG PAD ABDOMINAL 8X10 ST (GAUZE/BANDAGES/DRESSINGS) IMPLANT
GAUZE SPONGE 4X4 12PLY STRL (GAUZE/BANDAGES/DRESSINGS) IMPLANT
GAUZE XEROFORM 1X8 LF (GAUZE/BANDAGES/DRESSINGS) IMPLANT
GLOVE BIO SURGEON STRL SZ7.5 (GLOVE) ×2 IMPLANT
GLOVE BIOGEL PI IND STRL 8 (GLOVE) ×1 IMPLANT
GLOVE BIOGEL PI INDICATOR 8 (GLOVE) ×1
GLOVE BIOGEL PI ORTHO PRO SZ8 (GLOVE) ×1
GLOVE PI ORTHO PRO STRL SZ8 (GLOVE) ×1 IMPLANT
GLOVE SURG ORTHO 8.0 STRL STRW (GLOVE) ×4 IMPLANT
GOWN STRL REUS W/ TWL LRG LVL3 (GOWN DISPOSABLE) ×2 IMPLANT
GOWN STRL REUS W/ TWL XL LVL3 (GOWN DISPOSABLE) ×2 IMPLANT
GOWN STRL REUS W/TWL LRG LVL3 (GOWN DISPOSABLE) ×4
GOWN STRL REUS W/TWL XL LVL3 (GOWN DISPOSABLE) ×4
KIT ROOM TURNOVER OR (KITS) ×2 IMPLANT
MANIFOLD NEPTUNE II (INSTRUMENTS) IMPLANT
NDL 18GX1X1/2 (RX/OR ONLY) (NEEDLE) IMPLANT
NDL SPNL 18GX3.5 QUINCKE PK (NEEDLE) IMPLANT
NEEDLE 18GX1X1/2 (RX/OR ONLY) (NEEDLE) IMPLANT
NEEDLE 22X1 1/2 (OR ONLY) (NEEDLE) ×2 IMPLANT
NEEDLE SPNL 18GX3.5 QUINCKE PK (NEEDLE) IMPLANT
NS IRRIG 1000ML POUR BTL (IV SOLUTION) IMPLANT
PACK ARTHROSCOPY DSU (CUSTOM PROCEDURE TRAY) ×2 IMPLANT
PAD ARMBOARD 7.5X6 YLW CONV (MISCELLANEOUS) ×4 IMPLANT
PADDING CAST COTTON 6X4 STRL (CAST SUPPLIES) IMPLANT
SET ARTHROSCOPY TUBING (MISCELLANEOUS) ×2
SET ARTHROSCOPY TUBING LN (MISCELLANEOUS) ×1 IMPLANT
SPONGE LAP 4X18 X RAY DECT (DISPOSABLE) ×2 IMPLANT
SYR 20ML ECCENTRIC (SYRINGE) IMPLANT
SYR CONTROL 10ML LL (SYRINGE) IMPLANT
SYRINGE 20CC LL (MISCELLANEOUS) ×1 IMPLANT
TOWEL GREEN STERILE FF (TOWEL DISPOSABLE) ×1 IMPLANT
TOWEL OR 17X24 6PK STRL BLUE (TOWEL DISPOSABLE) ×2 IMPLANT
TOWEL OR 17X26 10 PK STRL BLUE (TOWEL DISPOSABLE) ×2 IMPLANT
TUBE CONNECTING 12X1/4 (SUCTIONS) ×2 IMPLANT
WAND HAND CNTRL MULTIVAC 90 (MISCELLANEOUS) IMPLANT
WATER STERILE IRR 1000ML POUR (IV SOLUTION) ×2 IMPLANT

## 2017-03-20 NOTE — H&P (Signed)
PREOPERATIVE H&P  Chief Complaint: menisecal tear  HPI: Gregory [name] is a 56 y.o. male who presents for preoperative history and physical with a diagnosis of menisecal tear. Symptoms are rated as moderate to severe, and have been worsening.  This is significantly impairing activities of daily living.  He has elected for surgical management.   He has had previous aspirations with significant fluid accumulations, and still had severe pain.      Past Medical History:  Diagnosis Date  . Allergy   . Anemia    low iron  . Anxiety   . Cancer [facility])    Renal cell cancer  . Diabetes mellitus without complication ([place])   . History of gastrointestinal hemorrhage   . Hypertension   . Hypertriglyceridemia   . Macular degeneration    lasar surgery- Dr Zigmund Daniel  . Neuropathy   . Nonischemic cardiomyopathy ([place])    EF 25-30% by echo 09/12/16  . Plantar warts   . PONV (postoperative nausea and vomiting)    " a little bit of nausea"  . Renal cell carcinoma [facility])    s/p nephrectomy 02/07/16 at [facility])   Past Surgical History:  Procedure Laterality Date  . CARDIAC CATHETERIZATION N/A 04/19/2016   Procedure: Left Heart Cath and Coronary Angiography;  Surgeon: Jolaine Artist, MD;  Location: [facility];  Service: Cardiovascular;  Laterality: N/A;  . COLONOSCOPY  03/22/2005  . EYE SURGERY Right    laser surgery, right eye macular degeneration  . KNEE ARTHROSCOPY Right    x 2  . NEPHRECTOMY Right   . orif fx fibula    . sebaceous cyst excision  1985  . TONSILLECTOMY    . TOTAL SHOULDER ARTHROPLASTY Left 11/11/2014   Procedure: LEFT TOTAL SHOULDER ARTHROPLASTY;  Surgeon: Netta Cedars, MD;  Location: [facility];  Service: Orthopedics;  Laterality: Left;  Marland Kitchen VASECTOMY     Social History   Social History  . Marital status: Married    Spouse name: N/A  . Number of children: N/A  . Years of education: N/A   Social History Main Topics  . Smoking status: Never Smoker  . Smokeless  tobacco: Never Used  . Alcohol use 0.0 oz/week     Comment: social  . Drug use: No  . Sexual activity: Not Asked   Other Topics Concern  . 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    Family History  Problem Relation Age of Onset  . Hypertension Mother   . Bipolar disorder Mother   . Cancer Mother 37       Brain  . Other Father        brain tumor  . Colon cancer Neg Hx   . Colon polyps Neg Hx   . Rectal cancer Neg Hx   . Stomach cancer Neg Hx   . Early death Neg Hx   . Hyperlipidemia Neg Hx   . Kidney disease Neg Hx    No Known Allergies Prior to Admission medications   Medication Sig Start Date End Date Taking? Authorizing Provider  carvedilol (COREG) 3.125 MG tablet TAKE 1 TABLET TWICE DAILY. Patient taking differently: Take 3.125 mg by mouth twice daily 09/17/16  Yes Larey Dresser, MD  cetirizine (ZYRTEC) 10 MG tablet Take 10  mg by mouth daily.   Yes [provider]  ciprofloxacin (CIPRO) 500 MG tablet Take 1 tablet (500 mg total) by mouth 2 (two) times daily. 03/14/17  Yes Curcio, Roselie Awkward, NP  ENTRESTO 49-51 MG TAKE 1 TABLET TWICE DAILY. Patient taking differently: Take 1 tablet by mouth twice daily 03/03/17  Yes Larey Dresser, MD  insulin aspart (NOVOLOG FLEXPEN) 100 UNIT/ML FlexPen Add to meal coverage Sliding scale CBG 70 - 120: 0 units CBG 121 - 150: 1 unit,  CBG 151 - 200: 2 units,  CBG 201 - 250: 3 units,  CBG 251 - 300: 5 units,  CBG 301 - 350: 7 units,  CBG 351 - 400: 9 units   CBG > 400: 9 units and notify your MD Patient taking differently: Inject 7-18 Units into the skin 3 (three) times daily with meals. Per sliding scale Add to meal coverage Sliding scale CBG 70 - 120: 0 units CBG 121 - 150: 1 unit,  CBG 151 - 200: 2 units,  CBG 201 - 250: 3 units,  CBG 251 - 300: 5 units,  CBG 301 -  350: 7 units,  CBG 351 - 400: 9 units   CBG > 400: 9 units and notify your MD 01/26/17  Yes Rai, Ripudeep K, MD  Insulin Glargine (LANTUS) 100 UNIT/ML Solostar Pen Inject 20 Units into the skin at bedtime. Patient taking differently: Inject 34 Units into the skin at bedtime.  01/26/17  Yes Rai, Ripudeep K, MD  Iron-Folic [id number] (FERRAPLUS 90) 90-1 MG TABS Take 1 tablet by mouth daily. 02/17/17  Yes [name], MD  levothyroxine (SYNTHROID, LEVOTHROID) 25 MCG tablet Take 1 tablet (25 mcg total) by mouth daily before breakfast. 03/14/17  Yes Curcio, Roselie Awkward, NP  PARoxetine (PAXIL-CR) 25 MG 24 hr tablet Take 1 tablet (25 mg total) by mouth daily. 11/15/16  Yes Wyatt Portela, MD  thiamine (VITAMIN B-1) 100 MG tablet Take 1 tablet (100 mg total) by mouth daily. 02/20/17  Yes [name], MD  blood glucose meter kit and supplies KIT Dispense based on patient and insurance preference. Use up to four times daily as directed. (FOR ICD-9 250.00, 250.01). Patient not taking: Reported on 03/19/2017 01/23/17   Sandi Mealy E., PA-C  Insulin Pen Needle 32G X 5 MM MISC 1 each by Does not apply route at bedtime. For Solostar Pen Patient not taking: Reported on 03/19/2017 01/23/17 01/23/18  Sandi Mealy E., PA-C  Insulin Syringe-Needle U-100 (INSULIN SYRINGE 1CC/31GX5/16") 31G X 5/16" 1 ML MISC 1 each by Does not apply route 3 (three) times daily before meals. Patient not taking: Reported on 03/19/2017 01/23/17 01/23/18  Sandi Mealy E., PA-C     Positive ROS: All other systems have been reviewed and were otherwise negative with the exception of those mentioned in the HPI and as above.  Physical Exam: General: Alert, no acute distress Cardiovascular: No pedal edema Respiratory: No cyanosis, no use of accessory musculature GI: No organomegaly, abdomen is soft and non-tender Skin: No lesions in the area of chief complaint Neurologic: Sensation intact distally Psychiatric: Patient is competent for  consent with normal mood and affect Lymphatic: No axillary or cervical lymphadenopathy  MUSCULOSKELETAL: right knee with effusion, 0-110, intact ligamentous stability, positive pain to palpation medial and lateral.  Assessment: menisecal tear right knee medial and lateral   Plan: Plan for Procedure(s): KNEE ARTHROSCOPY WITH MEDIAL AND LATERAL MENISECTOMY CHONDROPLASTY  The risks benefits and alternatives were discussed with  the patient including but not limited to the risks of nonoperative treatment, versus surgical intervention including infection, bleeding, nerve injury,  blood clots, cardiopulmonary complications, morbidity, mortality, among others, and they were willing to proceed.   We have also discussed the risk for incomplete relief of pain particularly if there is more chondral damage.   Johnny Bridge, MD Cell [phone]   03/20/2017 1:23 PM

## 2017-03-20 NOTE — Anesthesia Preprocedure Evaluation (Addendum)
Anesthesia Evaluation  Patient identified by MRN, date of birth, ID band Patient awake    Reviewed: Allergy & Precautions, NPO status , Patient's Chart, lab work & pertinent test results, reviewed documented beta blocker date and time   History of Anesthesia Complications (+) PONV  Airway Mallampati: II  TM Distance: >3 FB Neck ROM: Full    Dental  (+) Dental Advisory Given   Pulmonary neg pulmonary ROS,    breath sounds clear to auscultation       Cardiovascular hypertension, Pt. on medications and Pt. on home beta blockers +CHF (NICM: EF 25-30%)   Rhythm:Regular Rate:Normal     Neuro/Psych Anxiety negative neurological ROS     GI/Hepatic negative GI ROS, Neg liver ROS,   Endo/Other  diabetes, Insulin Dependent  Renal/GU Renal disease     Musculoskeletal   Abdominal   Peds  Hematology  (+) anemia ,   Anesthesia Other Findings   Reproductive/Obstetrics                            Lab Results  Component Value Date   WBC 8.9 03/14/2017   HGB 10.4 (L) 03/14/2017   HCT 31.5 (L) 03/14/2017   MCV 95.2 03/14/2017   PLT 460 (H) 03/14/2017   Lab Results  Component Value Date   CREATININE 1.1 03/14/2017   BUN 11.9 03/14/2017   NA 139 03/14/2017   K 4.5 03/14/2017   CL 107 01/26/2017   CO2 32 (H) 03/14/2017    Anesthesia Physical Anesthesia Plan  ASA: IV  Anesthesia Plan: General   Post-op Pain Management:    Induction: Intravenous  PONV Risk Score and Plan: 3 and Ondansetron, Dexamethasone, Midazolam, Propofol infusion and Treatment may vary due to age or medical condition  Airway Management Planned: LMA  Additional Equipment:   Intra-op Plan:   Post-operative Plan: Extubation in OR  Informed Consent: I have reviewed the patients History and Physical, chart, labs and discussed the procedure including the risks, benefits and alternatives for the proposed anesthesia with  the patient or authorized representative who has indicated his/her understanding and acceptance.   Dental advisory given  Plan Discussed with: CRNA  Anesthesia Plan Comments:        Anesthesia Quick Evaluation

## 2017-03-20 NOTE — Discharge Instructions (Signed)

## 2017-03-20 NOTE — Anesthesia Procedure Notes (Signed)
Procedure Name: LMA Insertion Date/Time: 03/20/2017 1:32 PM Performed by: Mariea Clonts Pre-anesthesia Checklist: Patient identified, Emergency Drugs available, Suction available and Patient being monitored Patient Re-evaluated:Patient Re-evaluated prior to induction Oxygen Delivery Method: Circle System Utilized Preoxygenation: Pre-oxygenation with 100% oxygen Induction Type: IV induction Ventilation: Mask ventilation without difficulty LMA: LMA inserted LMA Size: 5.0 Number of attempts: 1 Airway Equipment and Method: Bite block Placement Confirmation: positive ETCO2 Tube secured with: Tape Dental Injury: Teeth and Oropharynx as per pre-operative assessment

## 2017-03-20 NOTE — Progress Notes (Signed)
  I received a call from Dr. Mardelle Matte regarding Gregory [name] pre-operative cardiovascular risk status with respect to upcoming knee arthoscopy.   Gregory [name] is well known to me from the [facility]. He is a 56 y/o male with a h/o metastatic renal cell CA and NICM. EF has been about ~30%. Cath in 2017 with 1v CAD in small co-dominant RCA otherwise normal coronaries. I last saw him in 4/18 and he was very active and asymptomatic. He has apparently developed severe knee pain which has limited his functional status but otherwise remains relatively asymptomatic from a cardiac standpoint. He was scheduled for knee arthoscopy later today but this was unfortunately cancelled by anesthesia due to concern over his pre-operative risk.   Based on his stable clinical situation and previously good functional capacity, I think he is at low to moderate risk for peri-operative CV complications and I have cleared him to proceed without further cardiac testing. In order to keep the risk as low as possible, I would suggest avoiding GA if at all possible and using MAC and a regional block.  Please do not hesistate to contact me with questions. I will be available all day if any concerns arise.   Glori Bickers, MD  12:15 AM

## 2017-03-20 NOTE — Transfer of Care (Signed)
Immediate Anesthesia Transfer of Care Note  Patient: Gregory [name]  Procedure(s) Performed: KNEE ARTHROSCOPY WITH MEDIAL AND LATERAL MENISECTOMY (Right Knee) CHONDROPLASTY (Right Knee)  Patient Location: PACU  Anesthesia Type:General  Level of Consciousness: awake, alert  and oriented  Airway & Oxygen Therapy: Patient Spontanous Breathing and Patient connected to nasal cannula oxygen  Post-op Assessment: Report given to RN and Post -op Vital signs reviewed and stable  Post vital signs: Reviewed and stable  Last Vitals:  Vitals:   03/20/17 1059 03/20/17 1427  BP: (!) 111/59   Pulse: 81   Resp: 19   Temp: 36.9 C (!) (P) 36.4 C  SpO2: 100%     Last Pain:  Vitals:   03/20/17 1058  PainSc: 7       Patients Stated Pain Goal: 2 (01/00/71 2197)  Complications: No apparent anesthesia complications

## 2017-03-21 ENCOUNTER — Encounter (HOSPITAL_COMMUNITY): Payer: Self-pay | Admitting: Orthopedic Surgery

## 2017-03-21 DIAGNOSIS — C7802 Secondary malignant neoplasm of left lung: Secondary | ICD-10-CM | POA: Insufficient documentation

## 2017-03-21 NOTE — Op Note (Signed)
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  ANESTHESIA:   General  PREOPERATIVE INDICATIONS:  Gregory [name] is a  56 y.o. male with a diagnosis of menisecal tear who failed conservative measures and elected for surgical management.    The risks benefits and alternatives were discussed with the patient preoperatively including but not limited to the risks of infection, bleeding, nerve injury, cardiopulmonary complications, the need for revision surgery, among others, and the patient was willing to proceed.  ESTIMATED BLOOD LOSS: 20 mL  OPERATIVE IMPLANTS: None  OPERATIVE FINDINGS: His knee had full motion during examination under anesthesia although he had a fairly significant effusion.  He was stable to varus and valgus stress as well as Lachman and pivot shift with a negative dial test.  The patellofemoral chondral surfaces were in good condition, and the medial and lateral femoral condyles were not bad, although both the medial and lateral posterior tibial condyles had extensive grade 4 chondral loss.  There was complex tearing of the lateral meniscus as well as the medial meniscus with flap tear configuration.  There is already been a fairly significant amount of meniscal loss from his 2 previous operations.  His ACL has significant mucoid degeneration as well, although it did seem to take on tension, but was not the best quality.  OPERATIVE PROCEDURE: The patient was brought to the operating room and placed in supine position.  General anesthesia was administered.  IV  antibiotics were given.  His left knee was examined per his request and he did have an effusion there as well and was stable throughout the exam.  The right knee was then prepped and draped in usual sterile fashion.  Timeout was performed performed.  Diagnostic arthroscopy was carried out with the above named findings.  I did send some of his joint fluid for Gram stain culture and sensitivity and analysis and crystals just in case this could be gout.  This also could be a reactive knee effusion from his cancer drugs.  Having said that, he did have significant chondral changes as indicated above.  The arthroscopic shaver was used to debride the medial meniscus back to a stable configuration along the posterior horn and along the periphery of the remaining meniscus.  I performed a light chondroplasty of any loose chondral fragments on both the medial and lateral compartments.  I then went to the lateral compartment and debrided the meniscus back to a stable configuration.  Hopefully we improve his symptoms, if not his next step is likely to be a total knee replacement, although the real chondral problems are really just the posterior aspect of the medial and lateral tibial condyles, but nonetheless he is getting reactive recurrent significant knee effusions that are limiting his function.  The wounds were closed with Monocryl followed by Steri-Strips and sterile gauze.  He was awakened and returned to the PACU in stable and satisfactory condition.  There were no complications and he tolerated the procedure well.

## 2017-03-24 ENCOUNTER — Encounter (HOSPITAL_COMMUNITY): Payer: Self-pay | Admitting: Orthopedic Surgery

## 2017-03-24 NOTE — Anesthesia Postprocedure Evaluation (Signed)
Anesthesia Post Note  Patient: Gregory [name]  Procedure(s) Performed: KNEE ARTHROSCOPY WITH MEDIAL AND LATERAL MENISECTOMY (Right Knee) CHONDROPLASTY (Right Knee)     Patient location during evaluation: PACU Anesthesia Type: General Level of consciousness: awake and alert Pain management: pain level controlled Vital Signs Assessment: post-procedure vital signs reviewed and stable Respiratory status: spontaneous breathing, nonlabored ventilation, respiratory function stable and patient connected to nasal cannula oxygen Cardiovascular status: blood pressure returned to baseline and stable Postop Assessment: no apparent nausea or vomiting Anesthetic complications: no    Last Vitals:  Vitals:   03/20/17 1526 03/20/17 1530  BP: 106/65   Pulse: 66 67  Resp: 13 12  Temp:  36.7 C  SpO2: 95% 95%    Last Pain:  Vitals:   03/20/17 1530  PainSc: 0-No pain                 Tiajuana Amass

## 2017-03-25 LAB — CULTURE, BODY FLUID-BOTTLE: CULTURE: NO GROWTH

## 2017-03-25 LAB — CULTURE, BODY FLUID W GRAM STAIN -BOTTLE

## 2017-03-26 ENCOUNTER — Encounter (HOSPITAL_COMMUNITY): Payer: BLUE CROSS/BLUE SHIELD | Admitting: Internal Medicine

## 2017-03-26 DIAGNOSIS — S83281D Other tear of lateral meniscus, current injury, right knee, subsequent encounter: Secondary | ICD-10-CM | POA: Diagnosis not present

## 2017-03-26 DIAGNOSIS — S83241D Other tear of medial meniscus, current injury, right knee, subsequent encounter: Secondary | ICD-10-CM | POA: Diagnosis not present

## 2017-03-28 ENCOUNTER — Ambulatory Visit (HOSPITAL_BASED_OUTPATIENT_CLINIC_OR_DEPARTMENT_OTHER): Payer: BLUE CROSS/BLUE SHIELD | Admitting: Oncology

## 2017-03-28 ENCOUNTER — Telehealth: Payer: Self-pay

## 2017-03-28 ENCOUNTER — Other Ambulatory Visit (HOSPITAL_BASED_OUTPATIENT_CLINIC_OR_DEPARTMENT_OTHER): Payer: BLUE CROSS/BLUE SHIELD

## 2017-03-28 ENCOUNTER — Ambulatory Visit (HOSPITAL_BASED_OUTPATIENT_CLINIC_OR_DEPARTMENT_OTHER): Payer: BLUE CROSS/BLUE SHIELD

## 2017-03-28 VITALS — BP 97/62 | HR 100 | Temp 99.9°F | Resp 19 | Ht 74.0 in | Wt 222.6 lb

## 2017-03-28 DIAGNOSIS — C7800 Secondary malignant neoplasm of unspecified lung: Secondary | ICD-10-CM

## 2017-03-28 DIAGNOSIS — C649 Malignant neoplasm of unspecified kidney, except renal pelvis: Secondary | ICD-10-CM | POA: Diagnosis not present

## 2017-03-28 DIAGNOSIS — Z5112 Encounter for antineoplastic immunotherapy: Secondary | ICD-10-CM

## 2017-03-28 DIAGNOSIS — R972 Elevated prostate specific antigen [PSA]: Secondary | ICD-10-CM

## 2017-03-28 DIAGNOSIS — E119 Type 2 diabetes mellitus without complications: Secondary | ICD-10-CM | POA: Diagnosis not present

## 2017-03-28 DIAGNOSIS — I429 Cardiomyopathy, unspecified: Secondary | ICD-10-CM

## 2017-03-28 LAB — COMPREHENSIVE METABOLIC PANEL
ALBUMIN: 2.5 g/dL — AB (ref 3.5–5.0)
ALK PHOS: 54 U/L (ref 40–150)
ALT: 7 U/L (ref 0–55)
ANION GAP: 8 meq/L (ref 3–11)
AST: 10 U/L (ref 5–34)
BILIRUBIN TOTAL: 0.61 mg/dL (ref 0.20–1.20)
BUN: 16.3 mg/dL (ref 7.0–26.0)
CALCIUM: 9.1 mg/dL (ref 8.4–10.4)
CO2: 28 meq/L (ref 22–29)
CREATININE: 1.2 mg/dL (ref 0.7–1.3)
Chloride: 103 mEq/L (ref 98–109)
Glucose: 149 mg/dl — ABNORMAL HIGH (ref 70–140)
Potassium: 4.1 mEq/L (ref 3.5–5.1)
Sodium: 139 mEq/L (ref 136–145)
TOTAL PROTEIN: 6.8 g/dL (ref 6.4–8.3)

## 2017-03-28 LAB — CBC WITH DIFFERENTIAL/PLATELET
BASO%: 1 % (ref 0.0–2.0)
Basophils Absolute: 0.1 10*3/uL (ref 0.0–0.1)
EOS ABS: 0.2 10*3/uL (ref 0.0–0.5)
EOS%: 2.9 % (ref 0.0–7.0)
HEMATOCRIT: 31.3 % — AB (ref 38.4–49.9)
HGB: 10.2 g/dL — ABNORMAL LOW (ref 13.0–17.1)
LYMPH#: 0.8 10*3/uL — AB (ref 0.9–3.3)
LYMPH%: 9.2 % — ABNORMAL LOW (ref 14.0–49.0)
MCH: 29.4 pg (ref 27.2–33.4)
MCHC: 32.7 g/dL (ref 32.0–36.0)
MCV: 89.9 fL (ref 79.3–98.0)
MONO#: 0.7 10*3/uL (ref 0.1–0.9)
MONO%: 8.5 % (ref 0.0–14.0)
NEUT%: 78.4 % — ABNORMAL HIGH (ref 39.0–75.0)
NEUTROS ABS: 6.7 10*3/uL — AB (ref 1.5–6.5)
PLATELETS: 381 10*3/uL (ref 140–400)
RBC: 3.48 10*6/uL — ABNORMAL LOW (ref 4.20–5.82)
RDW: 19.4 % — ABNORMAL HIGH (ref 11.0–14.6)
WBC: 8.5 10*3/uL (ref 4.0–10.3)

## 2017-03-28 MED ORDER — SODIUM CHLORIDE 0.9 % IV SOLN
Freq: Once | INTRAVENOUS | Status: AC
  Administered 2017-03-28: 10:00:00 via INTRAVENOUS

## 2017-03-28 MED ORDER — SODIUM CHLORIDE 0.9% FLUSH
10.0000 mL | INTRAVENOUS | Status: DC | PRN
  Filled 2017-03-28: qty 10

## 2017-03-28 MED ORDER — SODIUM CHLORIDE 0.9 % IV SOLN
240.0000 mg | Freq: Once | INTRAVENOUS | Status: AC
  Administered 2017-03-28: 240 mg via INTRAVENOUS
  Filled 2017-03-28: qty 24

## 2017-03-28 MED ORDER — HEPARIN SOD (PORK) LOCK FLUSH 100 UNIT/ML IV SOLN
500.0000 [IU] | Freq: Once | INTRAVENOUS | Status: DC | PRN
  Filled 2017-03-28: qty 5

## 2017-03-28 NOTE — Progress Notes (Signed)
Hematology and Oncology Follow Up Visit  Gregory [name] [id number] 06-17-1960 56 y.o. 03/28/2017 9:50 AM Gregory [name], MDJones, Gregory Right, MD   Principle Diagnosis: 56 year old gentleman with renal cell cancer diagnosed in August 2017. He was found to have pulmonary nodules due to metastatic disease.   Prior Therapy: He is status post radical nephrectomy done on 02/07/2016 at [facility]. 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 The measurements of the tumor is 9.9 x 9.0 x 9.0 cm. He did have small pulmonary nodules and a repeat scan on 03/25/2016 showed a 0.6 mm left lower lobe nodule previously measuring 3 mm. There is a 1 cm left lower lobe nodule previously was 0.5.  Current therapy:  Nivolumab 240 mg every 2 weeks started on 06/07/2016. This is his first-line therapy chosen because of cardiomyopathy. He is here for evaluation before the next treatment.  Interim History: Gregory [name] presents today for a follow-up visit. Since the last visit, he reports undergoing arthroscopic knee surgery last week.  He started developing excruciating right-sided knee pain that required surgical intervention which has helped his symptoms but he is slowly improving. He has resumed most activities of daily living without any decline.  His blood sugar is under excellent control at this time.  He continues to tolerate Nivolumab without any new side effects. He denied any pulmonary complaints including shortness of breath, dyspnea on exertion or cough.   He does not report any headaches or blurry vision, syncope or seizures. He does not report any fevers, chills, sweats or weight loss. His appetite is excellent and have gained weight. He does not report any chest pain, palpitation, orthopnea or leg edema. He does not report any cough, wheezing or hemoptysis. He does not report any nausea, vomiting or abdominal pain. He does not report any frequency urgency or  hesitancy. Remaining review of systems unremarkable.   Medications: I have reviewed the patient's current medications.  Current Outpatient Prescriptions  Medication Sig Dispense Refill  . blood glucose meter kit and supplies KIT Dispense based on patient and insurance preference. Use up to four times daily as directed. (FOR ICD-9 250.00, 250.01). (Patient not taking: Reported on 03/19/2017) 1 each 0  . carvedilol (COREG) 3.125 MG tablet TAKE 1 TABLET TWICE DAILY. (Patient taking differently: Take 3.125 mg by mouth twice daily) 60 tablet 6  . cetirizine (ZYRTEC) 10 MG tablet Take 10 mg by mouth daily.    . ciprofloxacin (CIPRO) 500 MG tablet Take 1 tablet (500 mg total) by mouth 2 (two) times daily. 14 tablet 0  . ENTRESTO 49-51 MG TAKE 1 TABLET TWICE DAILY. (Patient taking differently: Take 1 tablet by mouth twice daily) 60 tablet 3  . HYDROcodone-acetaminophen (NORCO) 5-325 MG tablet Take 1-2 tablets by mouth every 6 (six) hours as needed for moderate pain. MAXIMUM TOTAL ACETAMINOPHEN DOSE IS 4000 MG PER DAY 30 tablet 0  . insulin aspart (NOVOLOG FLEXPEN) 100 UNIT/ML FlexPen Add to meal coverage Sliding scale CBG 70 - 120: 0 units CBG 121 - 150: 1 unit,  CBG 151 - 200: 2 units,  CBG 201 - 250: 3 units,  CBG 251 - 300: 5 units,  CBG 301 - 350: 7 units,  CBG 351 - 400: 9 units   CBG > 400: 9 units and notify your MD (Patient taking differently: Inject 7-18 Units into the skin 3 (three) times daily with meals. Per sliding scale Add to meal coverage Sliding scale  CBG 70 - 120: 0 units CBG 121 - 150: 1 unit,  CBG 151 - 200: 2 units,  CBG 201 - 250: 3 units,  CBG 251 - 300: 5 units,  CBG 301 - 350: 7 units,  CBG 351 - 400: 9 units   CBG > 400: 9 units and notify your MD) 15 mL 11  . Insulin Glargine (LANTUS) 100 UNIT/ML Solostar Pen Inject 20 Units into the skin at bedtime. (Patient taking differently: Inject 34 Units into the skin at bedtime. ) 15 mL 3  . Insulin Pen Needle 32G X 5 MM MISC 1 each by Does  not apply route at bedtime. For Solostar Pen (Patient not taking: Reported on 03/19/2017) 100 each 4  . Insulin Syringe-Needle U-100 (INSULIN SYRINGE 1CC/31GX5/16") 31G X 5/16" 1 ML MISC 1 each by Does not apply route 3 (three) times daily before meals. (Patient not taking: Reported on 03/19/2017) 100 each 6  . Iron-Folic [id number] (FERRAPLUS 90) 90-1 MG TABS Take 1 tablet by mouth daily. 30 tablet 5  . levothyroxine (SYNTHROID, LEVOTHROID) 25 MCG tablet Take 1 tablet (25 mcg total) by mouth daily before breakfast. 30 tablet 2  . ondansetron (ZOFRAN) 4 MG tablet Take 1 tablet (4 mg total) by mouth every 8 (eight) hours as needed for nausea or vomiting. 30 tablet 0  . PARoxetine (PAXIL-CR) 25 MG 24 hr tablet Take 1 tablet (25 mg total) by mouth daily. 90 tablet 3  . rivaroxaban (XARELTO) 10 MG TABS tablet Take 1 tablet (10 mg total) by mouth daily. 14 tablet 0  . sennosides-docusate sodium (SENOKOT-S) 8.6-50 MG tablet Take 2 tablets by mouth daily. 30 tablet 1  . thiamine (VITAMIN B-1) 100 MG tablet Take 1 tablet (100 mg total) by mouth daily. 90 tablet 1  . traMADol (ULTRAM) 50 MG tablet Take 1 tablet (50 mg total) by mouth every 6 (six) hours as needed. 30 tablet 0   No current facility-administered medications for this visit.      Allergies: No Known Allergies  Past Medical History, Surgical history, Social history, and Family History were reviewed and updated.   Physical Exam: Blood pressure 97/62, pulse 100, temperature 99.9 F (37.7 C), temperature source Oral, resp. rate 19, height '6\' 2"'  (1.88 m), weight 222 lb 9.6 oz (101 kg), SpO2 100 %. ECOG: 0 General appearance: Alert, awake gentleman without distress. Head: Normocephalic, without obvious abnormality no oral ulcers or lesions. Neck: no adenopathy no masses. Lymph nodes: Cervical, supraclavicular, and axillary nodes normal. Heart:regular rate and rhythm, S1, S2 normal, no murmur, click, rub or gallop Lung:chest clear,  no wheezing, rales, normal symmetric air entry Abdomin: soft, non-tender, without masses or organomegaly no shifting dullness or ascites. EXT:no erythema, induration, or nodules   Lab Results: Lab Results  Component Value Date   WBC 8.5 03/28/2017   HGB 10.2 (L) 03/28/2017   HCT 31.3 (L) 03/28/2017   MCV 89.9 03/28/2017   PLT 381 03/28/2017     Chemistry      Component Value Date/Time   NA 139 03/20/2017 1106   NA 139 03/14/2017 1114   K 4.2 03/20/2017 1106   K 4.5 03/14/2017 1114   CL 103 03/20/2017 1106   CO2 26 03/20/2017 1106   CO2 32 (H) 03/14/2017 1114   BUN 16 03/20/2017 1106   BUN 11.9 03/14/2017 1114   CREATININE 1.36 (H) 03/20/2017 1106   CREATININE 1.1 03/14/2017 1114      Component Value Date/Time   CALCIUM  8.9 03/20/2017 1106   CALCIUM 9.0 03/14/2017 1114   ALKPHOS 56 03/14/2017 1114   AST 11 03/14/2017 1114   ALT 7 03/14/2017 1114   BILITOT 0.40 03/14/2017 1114      CT scan of the chest on 03/06/2017 obtained at [facility]: There is no mediastinal, hilar or axillary adenopathy. There is an increasing enhancing pulmonary parenchymal metastasis in the subpleural left lower lobe measuring 2.5 cm, previously 2 cm (series 3 image 87). Stable punctate subcentimeter middle lobe lung nodule (series 4 image 70).  No pleural or pericardial effusion.  No suspicious osseous lesions.   Impression and Plan: 56 year old with the following issues:  1. 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 He is status post radical nephrectomy done on 02/07/2016 at [facility]. 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 The measurements of the tumor is 9.9 x 9.0 x 9.0 cm. He did have small pulmonary nodules   He is currently receiving Nivolumab 240 mg every 2 weeks started on 06/07/2016.   CT scan obtained in April 2018 at [facility] indicate stable disease.   CT scan obtained on 12/02/2016 was  reviewed and showed stable disease.  CT scan obtained on March 06, 2017 showed slight progression of disease.  The plan is to continue Nivolumab with the same dose and schedule for the time being.  He will have a surgical consultation at [facility] for possible surgical resection.  2. Diarrhea: Resolved at this time.  3. Cardiomyopathy: Continues to follow with cardiology and his cardiac medications are optimized. His EF continues to be close to 20%.  4. Thyroid function surveillance: He was started on Synthroid during his visit at [facility]. His last TSH was normal.  5. Diabetes: His currently on insulin and followed by his primary care physician as well as endocrinology. His blood sugar under reasonable control.  6. Elevated PSA: He has a urology referral for further evaluation.  7. Follow-up: Will be in every 2 weeks for Nivolumab treatment and will have an MD follow-up on 05/23/2017.   Zola Button, MD 11/2/20189:50 AM

## 2017-03-28 NOTE — Patient Instructions (Signed)
[place] Discharge Instructions for Patients Receiving Chemotherapy  Today you received the following chemotherapy agents: Nivolumab. If you develop diarrhea, take Imodium. Call the office for diarrhea that does not respond to Imodium.  If you develop nausea and vomiting that is not controlled by your nausea medication, call the clinic.   BELOW ARE SYMPTOMS THAT SHOULD BE REPORTED IMMEDIATELY:  *FEVER GREATER THAN 100.5 F  *CHILLS WITH OR WITHOUT FEVER  NAUSEA AND VOMITING THAT IS NOT CONTROLLED WITH YOUR NAUSEA MEDICATION  *UNUSUAL SHORTNESS OF BREATH  *UNUSUAL BRUISING OR BLEEDING  TENDERNESS IN MOUTH AND THROAT WITH OR WITHOUT PRESENCE OF ULCERS  *URINARY PROBLEMS  *BOWEL PROBLEMS  UNUSUAL RASH Items with * indicate a potential emergency and should be followed up as soon as possible.  Feel free to call the clinic should you have any questions or concerns. The clinic phone number is [phone].  Please show the [place] at check-in to the Emergency Department and triage nurse.

## 2017-03-28 NOTE — Telephone Encounter (Signed)
Printed avs and calender for upcoming appointment.per 11/2 los

## 2017-03-31 DIAGNOSIS — M25561 Pain in right knee: Secondary | ICD-10-CM | POA: Diagnosis not present

## 2017-03-31 DIAGNOSIS — M6281 Muscle weakness (generalized): Secondary | ICD-10-CM | POA: Diagnosis not present

## 2017-03-31 DIAGNOSIS — R262 Difficulty in walking, not elsewhere classified: Secondary | ICD-10-CM | POA: Diagnosis not present

## 2017-03-31 DIAGNOSIS — M25661 Stiffness of right knee, not elsewhere classified: Secondary | ICD-10-CM | POA: Diagnosis not present

## 2017-04-02 DIAGNOSIS — M25661 Stiffness of right knee, not elsewhere classified: Secondary | ICD-10-CM | POA: Diagnosis not present

## 2017-04-02 DIAGNOSIS — M25561 Pain in right knee: Secondary | ICD-10-CM | POA: Diagnosis not present

## 2017-04-02 DIAGNOSIS — R262 Difficulty in walking, not elsewhere classified: Secondary | ICD-10-CM | POA: Diagnosis not present

## 2017-04-02 DIAGNOSIS — M6281 Muscle weakness (generalized): Secondary | ICD-10-CM | POA: Diagnosis not present

## 2017-04-03 ENCOUNTER — Inpatient Hospital Stay (HOSPITAL_COMMUNITY): Admission: RE | Admit: 2017-04-03 | Payer: BLUE CROSS/BLUE SHIELD | Source: Ambulatory Visit

## 2017-04-03 ENCOUNTER — Ambulatory Visit (HOSPITAL_COMMUNITY)
Admission: RE | Admit: 2017-04-03 | Discharge: 2017-04-03 | Disposition: A | Discharge status: Home / Self Care | Payer: BLUE CROSS/BLUE SHIELD | Source: Ambulatory Visit | Attending: Orthopedic Surgery | Admitting: Orthopedic Surgery

## 2017-04-03 ENCOUNTER — Other Ambulatory Visit (HOSPITAL_COMMUNITY): Payer: Self-pay | Admitting: Orthopedic Surgery

## 2017-04-03 DIAGNOSIS — R609 Edema, unspecified: Secondary | ICD-10-CM | POA: Diagnosis not present

## 2017-04-03 DIAGNOSIS — R52 Pain, unspecified: Secondary | ICD-10-CM | POA: Insufficient documentation

## 2017-04-03 NOTE — Progress Notes (Signed)
Left lower extremity venous duplex has been completed. Negative for DVT. Results were given to [place] at Dr. Luanna Cole office.  04/03/17 11:27 AM Gregory [name] RVT

## 2017-04-09 ENCOUNTER — Encounter: Payer: BLUE CROSS/BLUE SHIELD | Attending: Internal Medicine | Admitting: *Deleted

## 2017-04-09 DIAGNOSIS — R262 Difficulty in walking, not elsewhere classified: Secondary | ICD-10-CM | POA: Diagnosis not present

## 2017-04-09 DIAGNOSIS — Z713 Dietary counseling and surveillance: Secondary | ICD-10-CM | POA: Diagnosis not present

## 2017-04-09 DIAGNOSIS — E109 Type 1 diabetes mellitus without complications: Secondary | ICD-10-CM | POA: Diagnosis not present

## 2017-04-09 DIAGNOSIS — IMO0001 Reserved for inherently not codable concepts without codable children: Secondary | ICD-10-CM

## 2017-04-09 DIAGNOSIS — M6281 Muscle weakness (generalized): Secondary | ICD-10-CM | POA: Diagnosis not present

## 2017-04-09 DIAGNOSIS — E119 Type 2 diabetes mellitus without complications: Secondary | ICD-10-CM

## 2017-04-09 DIAGNOSIS — M25561 Pain in right knee: Secondary | ICD-10-CM | POA: Diagnosis not present

## 2017-04-09 DIAGNOSIS — Z794 Long term (current) use of insulin: Secondary | ICD-10-CM | POA: Diagnosis not present

## 2017-04-09 DIAGNOSIS — M25661 Stiffness of right knee, not elsewhere classified: Secondary | ICD-10-CM | POA: Diagnosis not present

## 2017-04-09 NOTE — Patient Instructions (Addendum)
Plan:  Aim for 4 Carb Choices per meal (60 grams) +/- 1 either way  Aim for 0-2 Carbs per snack if hungry  Include protein in moderation with your meals and snacks Continue reading food labels for Total Carbohydrate of foods Continue with your activity level daily as tolerated Continue checking BG at alternate times per day  Continue taking medication as directed by MD  Today we discussed supplement ideas to keep your calorie and protein intake up with your decreased appetite. Consider Glucerna and/or Carnation Instant Breakfast, 4-6 oz as snacks between meals.   We also discussed the potential for an insulin pump. You are interested in possibly pursuing this idea before the end of the year due to the fact that your deductible has been met for this year. You would like to get financial information from [employer] and [employer]. You will also discuss with Dr. Buddy Duty.

## 2017-04-11 ENCOUNTER — Ambulatory Visit (HOSPITAL_BASED_OUTPATIENT_CLINIC_OR_DEPARTMENT_OTHER): Payer: BLUE CROSS/BLUE SHIELD

## 2017-04-11 ENCOUNTER — Other Ambulatory Visit (HOSPITAL_BASED_OUTPATIENT_CLINIC_OR_DEPARTMENT_OTHER): Payer: BLUE CROSS/BLUE SHIELD

## 2017-04-11 ENCOUNTER — Other Ambulatory Visit: Payer: BLUE CROSS/BLUE SHIELD

## 2017-04-11 VITALS — BP 116/64 | HR 89 | Temp 99.8°F | Resp 18

## 2017-04-11 DIAGNOSIS — Z5112 Encounter for antineoplastic immunotherapy: Secondary | ICD-10-CM

## 2017-04-11 DIAGNOSIS — M6281 Muscle weakness (generalized): Secondary | ICD-10-CM | POA: Diagnosis not present

## 2017-04-11 DIAGNOSIS — C7800 Secondary malignant neoplasm of unspecified lung: Secondary | ICD-10-CM

## 2017-04-11 DIAGNOSIS — M25561 Pain in right knee: Secondary | ICD-10-CM | POA: Diagnosis not present

## 2017-04-11 DIAGNOSIS — R262 Difficulty in walking, not elsewhere classified: Secondary | ICD-10-CM | POA: Diagnosis not present

## 2017-04-11 DIAGNOSIS — C649 Malignant neoplasm of unspecified kidney, except renal pelvis: Secondary | ICD-10-CM

## 2017-04-11 DIAGNOSIS — M25661 Stiffness of right knee, not elsewhere classified: Secondary | ICD-10-CM | POA: Diagnosis not present

## 2017-04-11 LAB — CBC WITH DIFFERENTIAL/PLATELET
BASO%: 0.7 % (ref 0.0–2.0)
BASOS ABS: 0 10*3/uL (ref 0.0–0.1)
EOS ABS: 0.2 10*3/uL (ref 0.0–0.5)
EOS%: 2.7 % (ref 0.0–7.0)
HCT: 26.4 % — ABNORMAL LOW (ref 38.4–49.9)
HGB: 8.3 g/dL — ABNORMAL LOW (ref 13.0–17.1)
LYMPH%: 10.5 % — AB (ref 14.0–49.0)
MCH: 26.8 pg — ABNORMAL LOW (ref 27.2–33.4)
MCHC: 31.6 g/dL — AB (ref 32.0–36.0)
MCV: 85 fL (ref 79.3–98.0)
MONO#: 0.7 10*3/uL (ref 0.1–0.9)
MONO%: 9.3 % (ref 0.0–14.0)
NEUT#: 5.6 10*3/uL (ref 1.5–6.5)
NEUT%: 76.8 % — AB (ref 39.0–75.0)
PLATELETS: 439 10*3/uL — AB (ref 140–400)
RBC: 3.1 10*6/uL — AB (ref 4.20–5.82)
RDW: 20.9 % — ABNORMAL HIGH (ref 11.0–14.6)
WBC: 7.3 10*3/uL (ref 4.0–10.3)
lymph#: 0.8 10*3/uL — ABNORMAL LOW (ref 0.9–3.3)

## 2017-04-11 LAB — COMPREHENSIVE METABOLIC PANEL
ALBUMIN: 2.3 g/dL — AB (ref 3.5–5.0)
ALT: 9 U/L (ref 0–55)
AST: 13 U/L (ref 5–34)
Alkaline Phosphatase: 62 U/L (ref 40–150)
Anion Gap: 10 mEq/L (ref 3–11)
BUN: 15.5 mg/dL (ref 7.0–26.0)
CALCIUM: 9.1 mg/dL (ref 8.4–10.4)
CHLORIDE: 102 meq/L (ref 98–109)
CO2: 27 mEq/L (ref 22–29)
CREATININE: 1 mg/dL (ref 0.7–1.3)
EGFR: >60 mL/min/{1.73_m2} (ref >60)
GLUCOSE: 113 mg/dL (ref 70–140)
POTASSIUM: 4.2 meq/L (ref 3.5–5.1)
SODIUM: 139 meq/L (ref 136–145)
Total Bilirubin: 0.44 mg/dL (ref 0.20–1.20)
Total Protein: 6.7 g/dL (ref 6.4–8.3)

## 2017-04-11 MED ORDER — SODIUM CHLORIDE 0.9 % IV SOLN
240.0000 mg | Freq: Once | INTRAVENOUS | Status: AC
  Administered 2017-04-11: 240 mg via INTRAVENOUS
  Filled 2017-04-11: qty 24

## 2017-04-11 MED ORDER — SODIUM CHLORIDE 0.9 % IV SOLN
Freq: Once | INTRAVENOUS | Status: AC
  Administered 2017-04-11: 14:00:00 via INTRAVENOUS

## 2017-04-11 NOTE — Patient Instructions (Signed)
[facility] Discharge Instructions for Patients Receiving Chemotherapy  Today you received the following chemotherapy agents: Nivolumab  To help prevent nausea and vomiting after your treatment, we encourage you to take your nausea medication as directed.    If you develop nausea and vomiting that is not controlled by your nausea medication, call the clinic.   BELOW ARE SYMPTOMS THAT SHOULD BE REPORTED IMMEDIATELY:  *FEVER GREATER THAN 100.5 F  *CHILLS WITH OR WITHOUT FEVER  NAUSEA AND VOMITING THAT IS NOT CONTROLLED WITH YOUR NAUSEA MEDICATION  *UNUSUAL SHORTNESS OF BREATH  *UNUSUAL BRUISING OR BLEEDING  TENDERNESS IN MOUTH AND THROAT WITH OR WITHOUT PRESENCE OF ULCERS  *URINARY PROBLEMS  *BOWEL PROBLEMS  UNUSUAL RASH Items with * indicate a potential emergency and should be followed up as soon as possible.  Feel free to call the clinic should you have any questions or concerns. The clinic phone number is [phone].  Please show the CHEMO ALERT CARD at check-in to the Emergency Department and triage nurse.   

## 2017-04-15 DIAGNOSIS — M6281 Muscle weakness (generalized): Secondary | ICD-10-CM | POA: Diagnosis not present

## 2017-04-15 DIAGNOSIS — M25561 Pain in right knee: Secondary | ICD-10-CM | POA: Diagnosis not present

## 2017-04-15 DIAGNOSIS — M25661 Stiffness of right knee, not elsewhere classified: Secondary | ICD-10-CM | POA: Diagnosis not present

## 2017-04-15 DIAGNOSIS — R262 Difficulty in walking, not elsewhere classified: Secondary | ICD-10-CM | POA: Diagnosis not present

## 2017-04-15 NOTE — Progress Notes (Signed)
Diabetes Self-Management Education  Visit Type:   Follow up   Appt. Start Time: 0800 Appt. End Time: 0900  04/09/2017  [name], identified by name and date of birth, is a 56 y.o. male with a diagnosis of Diabetes:  . He is here with his wife who participated in the visit. They stated he is comfortable with carb counting but his appetite is decreased and he gets full very quickly so they would like some suggestions on getting adequate nutrition in while controlling his BG.  Weight is stable at around 220 pounds. Activity is limited due to left knee pain after surgery on his right knee. He is traveling to [name] next month for a pulmonary visit for removal of 2 "spots" of cancer. Patient's primary interest today is to learn about ways to consume adequate calories and nutrition with decreased appetite.  ASSESSMENT  Height 6' 2" (1.88 m), weight 221 lb 8 oz (100.5 kg).  Body mass index is 28.44 kg/m.   Individualized Plan for Diabetes Self-Management Training:   Learning Objective:  Patient will have a greater understanding of diabetes self-management. Patient education plan is to attend individual and/or group sessions per assessed needs and concerns.   Plan:   Patient Instructions  Plan:  Aim for 4 Carb Choices per meal (60 grams) +/- 1 either way  Aim for 0-2 Carbs per snack if hungry  Include protein in moderation with your meals and snacks Continue reading food labels for Total Carbohydrate of foods Continue with your activity level daily as tolerated Continue checking BG at alternate times per day  Continue taking medication as directed by MD  Today we discussed supplement ideas to keep your calorie and protein intake up with your decreased appetite. Consider Glucerna and/or Carnation Instant Breakfast, 4-6 oz as snacks between meals.   We also discussed the potential for an insulin pump. You are interested in possibly pursuing this idea before the end of the  year due to the fact that your deductible has been met for this year. You would like to get financial information from [employer] and [employer]. You will also discuss with Dr. Buddy [name].  Expected Outcomes:     Education material provided: Intro to [employer] Nutrition supplement information  If problems or questions, patient to contact team via:  Phone  Future DSME appointment:   PRN

## 2017-04-16 ENCOUNTER — Telehealth: Payer: Self-pay | Admitting: Oncology

## 2017-04-16 ENCOUNTER — Encounter (HOSPITAL_COMMUNITY): Payer: BLUE CROSS/BLUE SHIELD | Admitting: Internal Medicine

## 2017-04-16 NOTE — Telephone Encounter (Signed)
Attempted to call patient regarding moving his appt on 11/30 to the afternoon - patient did not answer.

## 2017-04-20 ENCOUNTER — Other Ambulatory Visit: Payer: Self-pay | Admitting: Cardiology

## 2017-04-22 ENCOUNTER — Telehealth: Payer: Self-pay | Admitting: *Deleted

## 2017-04-22 DIAGNOSIS — M25561 Pain in right knee: Secondary | ICD-10-CM | POA: Diagnosis not present

## 2017-04-22 DIAGNOSIS — M25661 Stiffness of right knee, not elsewhere classified: Secondary | ICD-10-CM | POA: Diagnosis not present

## 2017-04-22 DIAGNOSIS — M6281 Muscle weakness (generalized): Secondary | ICD-10-CM | POA: Diagnosis not present

## 2017-04-22 DIAGNOSIS — R262 Difficulty in walking, not elsewhere classified: Secondary | ICD-10-CM | POA: Diagnosis not present

## 2017-04-22 NOTE — Telephone Encounter (Signed)
Per Dr. Alen Blew, I spoke with patient regarding appointments for this Friday, 11/30. Come in for labs at 8:45, see MD at 9:15, and infusion as scheduled. Dr. Alen Blew to discuss lab work. Patient verbalized understanding. LOS sent to scheduling.

## 2017-04-23 DIAGNOSIS — M25562 Pain in left knee: Secondary | ICD-10-CM | POA: Diagnosis not present

## 2017-04-25 ENCOUNTER — Other Ambulatory Visit (HOSPITAL_BASED_OUTPATIENT_CLINIC_OR_DEPARTMENT_OTHER): Payer: BLUE CROSS/BLUE SHIELD

## 2017-04-25 ENCOUNTER — Telehealth: Payer: Self-pay | Admitting: Oncology

## 2017-04-25 ENCOUNTER — Ambulatory Visit (HOSPITAL_BASED_OUTPATIENT_CLINIC_OR_DEPARTMENT_OTHER): Payer: BLUE CROSS/BLUE SHIELD

## 2017-04-25 ENCOUNTER — Ambulatory Visit: Payer: BLUE CROSS/BLUE SHIELD

## 2017-04-25 ENCOUNTER — Ambulatory Visit (HOSPITAL_COMMUNITY)
Admission: RE | Admit: 2017-04-25 | Discharge: 2017-04-25 | Disposition: A | Discharge status: Home / Self Care | Payer: BLUE CROSS/BLUE SHIELD | Source: Ambulatory Visit | Attending: Oncology | Admitting: Oncology

## 2017-04-25 ENCOUNTER — Ambulatory Visit (HOSPITAL_BASED_OUTPATIENT_CLINIC_OR_DEPARTMENT_OTHER): Payer: BLUE CROSS/BLUE SHIELD | Admitting: Oncology

## 2017-04-25 ENCOUNTER — Other Ambulatory Visit: Payer: BLUE CROSS/BLUE SHIELD

## 2017-04-25 VITALS — BP 109/58 | HR 95 | Temp 98.5°F | Resp 18 | Ht 74.0 in | Wt 213.1 lb

## 2017-04-25 DIAGNOSIS — C649 Malignant neoplasm of unspecified kidney, except renal pelvis: Secondary | ICD-10-CM

## 2017-04-25 DIAGNOSIS — I429 Cardiomyopathy, unspecified: Secondary | ICD-10-CM

## 2017-04-25 DIAGNOSIS — C7800 Secondary malignant neoplasm of unspecified lung: Secondary | ICD-10-CM | POA: Diagnosis not present

## 2017-04-25 DIAGNOSIS — R634 Abnormal weight loss: Secondary | ICD-10-CM

## 2017-04-25 DIAGNOSIS — D649 Anemia, unspecified: Secondary | ICD-10-CM

## 2017-04-25 DIAGNOSIS — E119 Type 2 diabetes mellitus without complications: Secondary | ICD-10-CM

## 2017-04-25 LAB — CBC WITH DIFFERENTIAL/PLATELET
BASO%: 1.1 % (ref 0.0–2.0)
BASOS ABS: 0.1 10*3/uL (ref 0.0–0.1)
EOS%: 2.2 % (ref 0.0–7.0)
Eosinophils Absolute: 0.2 10*3/uL (ref 0.0–0.5)
HEMATOCRIT: 26.3 % — AB (ref 38.4–49.9)
HEMOGLOBIN: 8.1 g/dL — AB (ref 13.0–17.1)
LYMPH#: 0.8 10*3/uL — AB (ref 0.9–3.3)
LYMPH%: 10 % — ABNORMAL LOW (ref 14.0–49.0)
MCH: 25 pg — AB (ref 27.2–33.4)
MCHC: 31 g/dL — ABNORMAL LOW (ref 32.0–36.0)
MCV: 80.8 fL (ref 79.3–98.0)
MONO#: 0.8 10*3/uL (ref 0.1–0.9)
MONO%: 8.9 % (ref 0.0–14.0)
NEUT%: 77.8 % — ABNORMAL HIGH (ref 39.0–75.0)
NEUTROS ABS: 6.6 10*3/uL — AB (ref 1.5–6.5)
Platelets: 520 10*3/uL — ABNORMAL HIGH (ref 140–400)
RBC: 3.25 10*6/uL — ABNORMAL LOW (ref 4.20–5.82)
RDW: 20.4 % — AB (ref 11.0–14.6)
WBC: 8.5 10*3/uL (ref 4.0–10.3)

## 2017-04-25 LAB — RETICULOCYTES (CHCC)
Immature Retic Fract: 11.4 % — ABNORMAL HIGH (ref 3.00–10.60)
RBC: 3.3 10*6/uL — AB (ref 4.20–5.82)
Retic %: 1.43 % (ref 0.80–1.80)
Retic Ct Abs: 47.19 10*3/uL (ref 34.80–93.90)

## 2017-04-25 LAB — COMPREHENSIVE METABOLIC PANEL
ALT: 11 U/L (ref 0–55)
ANION GAP: 9 meq/L (ref 3–11)
AST: 14 U/L (ref 5–34)
Albumin: 2.4 g/dL — ABNORMAL LOW (ref 3.5–5.0)
Alkaline Phosphatase: 64 U/L (ref 40–150)
BILIRUBIN TOTAL: 0.49 mg/dL (ref 0.20–1.20)
BUN: 17.7 mg/dL (ref 7.0–26.0)
CHLORIDE: 100 meq/L (ref 98–109)
CO2: 29 meq/L (ref 22–29)
Calcium: 9.2 mg/dL (ref 8.4–10.4)
Creatinine: 1 mg/dL (ref 0.7–1.3)
Glucose: 266 mg/dl — ABNORMAL HIGH (ref 70–140)
Potassium: 4.3 mEq/L (ref 3.5–5.1)
Sodium: 138 mEq/L (ref 136–145)
TOTAL PROTEIN: 7 g/dL (ref 6.4–8.3)

## 2017-04-25 LAB — PREPARE RBC (CROSSMATCH)

## 2017-04-25 LAB — ABO/RH: ABO/RH(D): O NEG

## 2017-04-25 MED ORDER — HEPARIN SOD (PORK) LOCK FLUSH 100 UNIT/ML IV SOLN
500.0000 [IU] | Freq: Every day | INTRAVENOUS | Status: DC | PRN
  Filled 2017-04-25: qty 5

## 2017-04-25 MED ORDER — DIPHENHYDRAMINE HCL 25 MG PO CAPS
25.0000 mg | ORAL_CAPSULE | Freq: Once | ORAL | Status: AC
  Administered 2017-04-25: 25 mg via ORAL

## 2017-04-25 MED ORDER — SODIUM CHLORIDE 0.9 % IV SOLN
250.0000 mL | Freq: Once | INTRAVENOUS | Status: AC
  Administered 2017-04-25: 250 mL via INTRAVENOUS

## 2017-04-25 MED ORDER — ACETAMINOPHEN 325 MG PO TABS
ORAL_TABLET | ORAL | Status: AC
  Filled 2017-04-25: qty 2

## 2017-04-25 MED ORDER — HEPARIN SOD (PORK) LOCK FLUSH 100 UNIT/ML IV SOLN
250.0000 [IU] | INTRAVENOUS | Status: DC | PRN
  Filled 2017-04-25: qty 5

## 2017-04-25 MED ORDER — DIPHENHYDRAMINE HCL 25 MG PO CAPS
ORAL_CAPSULE | ORAL | Status: AC
  Filled 2017-04-25: qty 1

## 2017-04-25 MED ORDER — ACETAMINOPHEN 325 MG PO TABS
650.0000 mg | ORAL_TABLET | Freq: Once | ORAL | Status: AC
  Administered 2017-04-25: 650 mg via ORAL

## 2017-04-25 MED ORDER — SODIUM CHLORIDE 0.9% FLUSH
3.0000 mL | INTRAVENOUS | Status: DC | PRN
  Filled 2017-04-25: qty 10

## 2017-04-25 MED ORDER — SODIUM CHLORIDE 0.9% FLUSH
10.0000 mL | INTRAVENOUS | Status: DC | PRN
  Filled 2017-04-25: qty 10

## 2017-04-25 NOTE — Telephone Encounter (Signed)
Gave avs pateint ask for all records

## 2017-04-25 NOTE — Addendum Note (Signed)
Addended by: Wyatt Portela on: 04/25/2017 11:35 AM   Modules accepted: Orders

## 2017-04-25 NOTE — Progress Notes (Addendum)
Hematology and Oncology Follow Up Visit  Gregory [name] [id number] Mar 27, 1961 56 y.o. 04/25/2017 10:07 AM Gregory [name], MDJones, Gregory Right, MD   Principle Diagnosis: Gregory [name] with renal cell cancer diagnosed in August 2017. He was found to have pulmonary nodules due to metastatic disease.   Prior Therapy: He is status post radical nephrectomy done on 02/07/2016 at [facility]. 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 The measurements of the tumor is 9.9 x 9.0 x 9.0 cm. He did have small pulmonary nodules and a repeat scan on 03/25/2016 showed a 0.6 mm left lower lobe nodule previously measuring 3 mm. There is a 1 cm left lower lobe nodule previously was 0.5.  Current therapy:  Nivolumab 240 mg every 2 weeks started on 06/07/2016. This is Gregory [name] first-line therapy chosen because of cardiomyopathy. He is here for evaluation before the next treatment.  Interim History: Gregory [name] presents today for a follow-up visit. Since the last visit, he reports more decline in Gregory [name] overall health since the last visit.  He is reporting more fatigue, tiredness and slightly exertional dyspnea.  Gregory [name] mobility is limited because of Gregory [name] knee surgery but still able to work full-time.  Gregory [name] appetite has been poor and have lost more weight.  He denies any abdominal pain, hematochezia or melena.  He was taken oral iron replacement that has been recently increased to 3 times a day.  He has been taking protein supplements to help boost Gregory [name] nutritional status.  He does not report any headaches or blurry vision, syncope or seizures. He does not report any fevers, chills, sweats or weight loss. Gregory [name] appetite is excellent and have gained weight. He does not report any chest pain, palpitation, orthopnea or leg edema. He does not report any cough, wheezing or hemoptysis. He does not report any nausea, vomiting or abdominal pain. He does not report any frequency urgency or  hesitancy. Remaining review of systems unremarkable.   Medications: I have reviewed the patient's current medications.  Current Outpatient Medications  Medication Sig Dispense Refill  . blood glucose meter kit and supplies KIT Dispense based on patient and insurance preference. Use up to four times daily as directed. (FOR ICD-9 250.00, 250.01). (Patient not taking: Reported on 03/19/2017) 1 each 0  . carvedilol (COREG) 3.125 MG tablet TAKE 1 TABLET BY MOUTH TWICE DAILY. 60 tablet 1  . cetirizine (ZYRTEC) 10 MG tablet Take 10 mg by mouth daily.    . ciprofloxacin (CIPRO) 500 MG tablet Take 1 tablet (500 mg total) by mouth 2 (two) times daily. 14 tablet 0  . ENTRESTO 49-51 MG TAKE 1 TABLET TWICE DAILY. (Patient taking differently: Take 1 tablet by mouth twice daily) 60 tablet 3  . HYDROcodone-acetaminophen (NORCO) 5-325 MG tablet Take 1-2 tablets by mouth every 6 (six) hours as needed for moderate pain. MAXIMUM TOTAL ACETAMINOPHEN DOSE IS 4000 MG PER DAY 30 tablet 0  . insulin aspart (NOVOLOG FLEXPEN) 100 UNIT/ML FlexPen Add to meal coverage Sliding scale CBG 70 - 120: 0 units CBG 121 - 150: 1 unit,  CBG 151 - 200: 2 units,  CBG 201 - 250: 3 units,  CBG 251 - 300: 5 units,  CBG 301 - 350: 7 units,  CBG 351 - 400: 9 units   CBG > 400: 9 units and notify your MD (Patient taking differently: Inject 7-18 Units into the skin 3 (three) times daily with meals. Per sliding scale Add to meal  coverage Sliding scale CBG 70 - 120: 0 units CBG 121 - 150: 1 unit,  CBG 151 - 200: 2 units,  CBG 201 - 250: 3 units,  CBG 251 - 300: 5 units,  CBG 301 - 350: 7 units,  CBG 351 - 400: 9 units   CBG > 400: 9 units and notify your MD) 15 mL 11  . Insulin Glargine (LANTUS) 100 UNIT/ML Solostar Pen Inject 20 Units into the skin at bedtime. (Patient taking differently: Inject 34 Units into the skin at bedtime. ) 15 mL 3  . Insulin Pen Needle 32G X 5 MM MISC 1 each by Does not apply route at bedtime. For Solostar Pen (Patient not  taking: Reported on 03/19/2017) 100 each 4  . Insulin Syringe-Needle U-100 (INSULIN SYRINGE 1CC/31GX5/16") 31G X 5/16" 1 ML MISC 1 each by Does not apply route 3 (three) times daily before meals. (Patient not taking: Reported on 03/19/2017) 100 each 6  . Iron-Folic [id number]-OEUMPNTI (FERRAPLUS 90) 90-1 MG TABS Take 1 tablet by mouth daily. 30 tablet 5  . levothyroxine (SYNTHROID, LEVOTHROID) 25 MCG tablet Take 1 tablet (25 mcg total) by mouth daily before breakfast. 30 tablet 2  . ondansetron (ZOFRAN) 4 MG tablet Take 1 tablet (4 mg total) by mouth every 8 (eight) hours as needed for nausea or vomiting. 30 tablet 0  . PARoxetine (PAXIL-CR) 25 MG 24 hr tablet Take 1 tablet (25 mg total) by mouth daily. 90 tablet 3  . rivaroxaban (XARELTO) 10 MG TABS tablet Take 1 tablet (10 mg total) by mouth daily. 14 tablet 0  . sennosides-docusate sodium (SENOKOT-S) 8.6-50 MG tablet Take 2 tablets by mouth daily. 30 tablet 1  . thiamine (VITAMIN B-1) 100 MG tablet Take 1 tablet (100 mg total) by mouth daily. 90 tablet 1  . traMADol (ULTRAM) 50 MG tablet Take 1 tablet (50 mg total) by mouth every 6 (six) hours as needed. 30 tablet 0   No current facility-administered medications for this visit.      Allergies: No Known Allergies  Past Medical History, Surgical history, Social history, and Family History were reviewed and updated.   Physical Exam: Blood pressure (!) 109/58, pulse 95, temperature 98.5 F (36.9 C), temperature source Oral, resp. rate 18, height '6\' 2"'  (1.88 m), weight 213 lb 1.6 oz (96.7 kg), SpO2 100 %. ECOG: 0 General appearance: Awake [name] appeared pale. Head: Normocephalic, without obvious abnormality no oral ulcers or thrush. Neck: no adenopathy no masses. Lymph nodes: Cervical, supraclavicular, and axillary nodes normal. Heart:regular rate and rhythm, S1, S2 normal, no murmur, click, rub or gallop Lung:chest clear, no wheezing, rales, normal symmetric air entry Abdomin: soft,  non-tender, without masses or organomegaly no rebound or guarding. EXT:no erythema, induration, or nodules   Lab Results: Lab Results  Component Value Date   WBC 8.5 04/25/2017   HGB 8.1 (L) 04/25/2017   HCT 26.3 (L) 04/25/2017   MCV 80.8 04/25/2017   PLT 520 (H) 04/25/2017     Chemistry      Component Value Date/Time   NA 138 04/25/2017 0859   K 4.3 04/25/2017 0859   CL 103 03/20/2017 1106   CO2 29 04/25/2017 0859   BUN 17.7 04/25/2017 0859   CREATININE 1.0 04/25/2017 0859      Component Value Date/Time   CALCIUM 9.2 04/25/2017 0859   ALKPHOS 64 04/25/2017 0859   AST 14 04/25/2017 0859   ALT 11 04/25/2017 0859   BILITOT 0.49 04/25/2017 0859  CT scan of the chest on 03/06/2017 obtained at [facility]: There is no mediastinal, hilar or axillary adenopathy. There is an increasing enhancing pulmonary parenchymal metastasis in the subpleural left lower lobe measuring 2.5 cm, previously 2 cm (series 3 image 87). Stable punctate subcentimeter middle lobe lung nodule (series 4 image 70).  No pleural or pericardial effusion.  No suspicious osseous lesions.   Impression and Plan: Gregory [name] with the following issues:  1. 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 He is status post radical nephrectomy done on 02/07/2016 at [facility]. 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 The measurements of the tumor is 9.9 x 9.0 x 9.0 cm. He did have small pulmonary nodules   He is currently receiving Nivolumab 240 mg every 2 weeks started on 06/07/2016.   CT scan obtained in April 2018 at [facility] indicate stable disease.   CT scan obtained on 12/02/2016 was reviewed and showed stable disease.  CT scan obtained on March 06, 2017 showed slight progression of disease with slight increase in Gregory [name] pulmonary nodules.  The plan is to hold Nivolumab at this time given the constellation of symptoms he  is experiencing.  He is reporting more fatigue, tiredness and failure to thrive and worsening anemia.  Gregory [name] disease has progressed on this therapy and I will hold Gregory [name] treatment for the time being.  2.  Anemia: Appears to be multifactorial in nature with related to chronic disease as well as iron deficiency.  He is symptomatic at this time and would benefit from packed red cell transfusion.  Risks and benefits of transfusion were discussed today and is agreeable to receive 2 units.  3. Cardiomyopathy: Continues to follow with cardiology and Gregory [name] cardiac medications are optimized. Gregory [name] EF continues to be close to 20%.  4. Thyroid function surveillance: He was started on Synthroid during Gregory [name] visit at [facility]. Gregory [name] last TSH was normal.  5. Diabetes: Gregory [name] currently on insulin and followed by Gregory [name] primary care physician as well as endocrinology. Gregory [name] blood sugar under reasonable control.  6.  Weight loss and low protein: Multifactorial in nature.  Related to Gregory [name] malignancy and possibly cancer treatment.  Gregory [name] recent orthopedic operation as well as diagnosis of diabetes is likely contributing.    I will hold Gregory [name] Nivolumab moving forward at this time.  I have recommended repeating imaging studies in the immediate future to rule out any cancer progression.  Gregory [name] last CT scan was done in October 2018.  He is plan to have a visit with Gregory [name] oncologist in [facility] and likely will repeat imaging studies at the time.  In the meantime, I urged him to continue with protein supplements and will continue supportive care as needed.  7. Follow-up: Will be on 05/23/2017.   Zola Button, MD 11/30/201810:07 AM   Gregory [name] reticulocyte count is normal at 47.19 with a reticulocyte percentage is 1.43 which is also within normal range.  Gregory [name] immature reticulocyte fraction was slightly elevated at 11.4%.   Etiology of Gregory [name] anemia remains unclear without any clear signs of bleeding.  This was discussed with Dr. Megan Salon Gregory [name]  oncologist at [facility].  In addition to packed red cell transfusion, he suggested the use of steroid taper potentially if Gregory [name] symptoms persist despite holding Nivolumab.  Zola Button MD 04/25/17

## 2017-04-25 NOTE — Patient Instructions (Signed)

## 2017-04-25 NOTE — Addendum Note (Signed)
Addended by: Wyatt Portela on: 04/25/2017 03:50 PM   Modules accepted: Orders

## 2017-04-26 ENCOUNTER — Encounter: Payer: Self-pay | Admitting: Oncology

## 2017-04-26 LAB — TYPE AND SCREEN
ABO/RH(D): O NEG
ANTIBODY SCREEN: NEGATIVE
UNIT DIVISION: 0
UNIT DIVISION: 0

## 2017-04-26 LAB — BPAM RBC
BLOOD PRODUCT EXPIRATION DATE: 201812072359
Blood Product Expiration Date: 201812062359
ISSUE DATE / TIME: 201811301150
ISSUE DATE / TIME: 201811301150
UNIT TYPE AND RH: 9500
Unit Type and Rh: 9500

## 2017-04-28 ENCOUNTER — Telehealth: Payer: Self-pay | Admitting: *Deleted

## 2017-04-28 ENCOUNTER — Ambulatory Visit (HOSPITAL_BASED_OUTPATIENT_CLINIC_OR_DEPARTMENT_OTHER): Payer: BLUE CROSS/BLUE SHIELD

## 2017-04-28 ENCOUNTER — Telehealth: Payer: Self-pay | Admitting: Oncology

## 2017-04-28 DIAGNOSIS — I429 Cardiomyopathy, unspecified: Secondary | ICD-10-CM

## 2017-04-28 DIAGNOSIS — C7800 Secondary malignant neoplasm of unspecified lung: Secondary | ICD-10-CM | POA: Diagnosis not present

## 2017-04-28 DIAGNOSIS — C649 Malignant neoplasm of unspecified kidney, except renal pelvis: Secondary | ICD-10-CM | POA: Diagnosis not present

## 2017-04-28 DIAGNOSIS — D649 Anemia, unspecified: Secondary | ICD-10-CM

## 2017-04-28 LAB — CBC WITH DIFFERENTIAL/PLATELET
BASO%: 1 % (ref 0.0–2.0)
BASOS ABS: 0.1 10*3/uL (ref 0.0–0.1)
EOS ABS: 0.2 10*3/uL (ref 0.0–0.5)
EOS%: 2.3 % (ref 0.0–7.0)
HEMATOCRIT: 34 % — AB (ref 38.4–49.9)
HEMOGLOBIN: 10.6 g/dL — AB (ref 13.0–17.1)
LYMPH%: 16.7 % (ref 14.0–49.0)
MCH: 25.1 pg — ABNORMAL LOW (ref 27.2–33.4)
MCHC: 31.3 g/dL — ABNORMAL LOW (ref 32.0–36.0)
MCV: 80.3 fL (ref 79.3–98.0)
MONO#: 0.7 10*3/uL (ref 0.1–0.9)
MONO%: 9.8 % (ref 0.0–14.0)
NEUT%: 70.2 % (ref 39.0–75.0)
NEUTROS ABS: 4.7 10*3/uL (ref 1.5–6.5)
PLATELETS: 539 10*3/uL — AB (ref 140–400)
RBC: 4.23 10*6/uL (ref 4.20–5.82)
RDW: 20.6 % — AB (ref 11.0–14.6)
WBC: 6.8 10*3/uL (ref 4.0–10.3)
lymph#: 1.1 10*3/uL (ref 0.9–3.3)

## 2017-04-28 LAB — PROTIME-INR
INR: 1.2 — AB (ref 2.00–3.50)
PROTIME: 14.4 s — AB (ref 10.6–13.4)

## 2017-04-28 LAB — IRON AND TIBC
%SAT: 7 % — AB (ref 20–55)
Iron: 12 ug/dL — ABNORMAL LOW (ref 42–163)
TIBC: 175 ug/dL — ABNORMAL LOW (ref 202–409)
UIBC: 163 ug/dL (ref 117–376)

## 2017-04-28 LAB — FERRITIN: Ferritin: 1519 ng/ml — ABNORMAL HIGH (ref 22–316)

## 2017-04-28 LAB — COMPREHENSIVE METABOLIC PANEL
ALT: 10 U/L (ref 0–55)
ANION GAP: 9 meq/L (ref 3–11)
AST: 12 U/L (ref 5–34)
Albumin: 2.7 g/dL — ABNORMAL LOW (ref 3.5–5.0)
Alkaline Phosphatase: 73 U/L (ref 40–150)
BUN: 15.5 mg/dL (ref 7.0–26.0)
CHLORIDE: 101 meq/L (ref 98–109)
CO2: 30 meq/L — AB (ref 22–29)
CREATININE: 1 mg/dL (ref 0.7–1.3)
Calcium: 9.7 mg/dL (ref 8.4–10.4)
EGFR: >60 mL/min/{1.73_m2} (ref >60)
Glucose: 178 mg/dl — ABNORMAL HIGH (ref 70–140)
POTASSIUM: 4.2 meq/L (ref 3.5–5.1)
Sodium: 139 mEq/L (ref 136–145)
Total Bilirubin: 0.61 mg/dL (ref 0.20–1.20)
Total Protein: 7.8 g/dL (ref 6.4–8.3)

## 2017-04-28 LAB — TSH: TSH: 2.781 m(IU)/L (ref 0.320–4.118)

## 2017-04-28 NOTE — Telephone Encounter (Signed)
Left message for patient regarding upcoming December appointments. Patient moved appointment from 12/5 tom 12/3

## 2017-04-28 NOTE — Telephone Encounter (Signed)
Received voice mail message from patient stating,"I need to see if I can move my lab appointment from Wednesday, 12/5 to today. Dr. Megan Salon at [facility] wants some extra labs drawn. Please call me back at [phone]"

## 2017-04-29 ENCOUNTER — Telehealth: Payer: Self-pay | Admitting: *Deleted

## 2017-04-29 LAB — ACTH: ACTH: 30.3 pg/mL (ref 7.2–63.3)

## 2017-04-29 LAB — T3 UPTAKE
FREE THYROXINE INDEX: 1.8 (ref 1.2–4.9)
T3 Uptake Ratio: 26 % (ref 24–39)

## 2017-04-29 LAB — APTT: APTT: 37 s — AB (ref 24–33)

## 2017-04-29 LAB — CORTISOL: CORTISOL: 19 ug/dL

## 2017-04-29 LAB — VITAMIN B12: VITAMIN B 12: 414 pg/mL (ref 232–1245)

## 2017-04-29 LAB — FOLATE: FOLATE: 11 ng/mL (ref >3.0)

## 2017-04-29 LAB — T4, FREE: FREE T4: 1.14 ng/dL (ref 0.82–1.77)

## 2017-04-29 LAB — T4: T4 TOTAL: 7 ug/dL (ref 4.5–12.0)

## 2017-04-29 NOTE — Telephone Encounter (Signed)
ERROR

## 2017-04-30 ENCOUNTER — Encounter: Payer: Self-pay | Admitting: Oncology

## 2017-04-30 ENCOUNTER — Other Ambulatory Visit: Payer: BLUE CROSS/BLUE SHIELD

## 2017-04-30 DIAGNOSIS — M6281 Muscle weakness (generalized): Secondary | ICD-10-CM | POA: Diagnosis not present

## 2017-04-30 DIAGNOSIS — M25561 Pain in right knee: Secondary | ICD-10-CM | POA: Diagnosis not present

## 2017-04-30 DIAGNOSIS — M25661 Stiffness of right knee, not elsewhere classified: Secondary | ICD-10-CM | POA: Diagnosis not present

## 2017-04-30 DIAGNOSIS — M25562 Pain in left knee: Secondary | ICD-10-CM | POA: Diagnosis not present

## 2017-04-30 NOTE — Progress Notes (Signed)
The results of the laboratory testing obtained on 04/28/2017 was discussed with Mr. Kimberlin over the phone today.  His workup did not really show any endocrinopathy or vitamin deficiency.  He does have low iron with decreased iron binding capacity indicating likely chronic disease in addition to an element of iron deficiency.  He continues to have issues with joint soreness although no swelling or stiffness.  This certainly could be a manifestation of autoimmune arthritis related to his checkpoint inhibitors that has been discontinued for now.  We have discussed the possibility of using a steroid taper which certainly can help if this is an immune mediated event.  The downside of using steroids was discussed today which includes hyperglycemia given his blood sugar that has been still poorly controlled.  I will arrange a visit on 05/02/2017 to reevaluate his joint tenderness and consider rheumatological workup at that time as well as consideration for quick trial of steroids.

## 2017-04-30 NOTE — Telephone Encounter (Signed)
No note

## 2017-05-01 ENCOUNTER — Telehealth: Payer: Self-pay | Admitting: Oncology

## 2017-05-01 ENCOUNTER — Encounter: Payer: Self-pay | Admitting: Internal Medicine

## 2017-05-01 ENCOUNTER — Ambulatory Visit (INDEPENDENT_AMBULATORY_CARE_PROVIDER_SITE_OTHER): Payer: BLUE CROSS/BLUE SHIELD | Admitting: Internal Medicine

## 2017-05-01 ENCOUNTER — Ambulatory Visit: Payer: BLUE CROSS/BLUE SHIELD | Admitting: Oncology

## 2017-05-01 VITALS — BP 110/64 | HR 86 | Temp 98.6°F | Ht 74.0 in | Wt 206.0 lb

## 2017-05-01 DIAGNOSIS — M6281 Muscle weakness (generalized): Secondary | ICD-10-CM | POA: Diagnosis not present

## 2017-05-01 DIAGNOSIS — E519 Thiamine deficiency, unspecified: Secondary | ICD-10-CM | POA: Diagnosis not present

## 2017-05-01 DIAGNOSIS — D500 Iron deficiency anemia secondary to blood loss (chronic): Secondary | ICD-10-CM | POA: Diagnosis not present

## 2017-05-01 DIAGNOSIS — M25661 Stiffness of right knee, not elsewhere classified: Secondary | ICD-10-CM | POA: Diagnosis not present

## 2017-05-01 DIAGNOSIS — I10 Essential (primary) hypertension: Secondary | ICD-10-CM

## 2017-05-01 DIAGNOSIS — M25561 Pain in right knee: Secondary | ICD-10-CM | POA: Diagnosis not present

## 2017-05-01 DIAGNOSIS — M25562 Pain in left knee: Secondary | ICD-10-CM | POA: Diagnosis not present

## 2017-05-01 NOTE — Telephone Encounter (Signed)
Spoke with patient regarding appt that was added per 12/5 sch msg.

## 2017-05-01 NOTE — Progress Notes (Signed)
Subjective:  Patient ID: Gregory [name], male    DOB: Jan 30, 1961  Age: 56 y.o. MRN: [id number]  CC: Follow-up (3 month)   HPI Gregory [name] presents for follow-up.  He is 5 days status post transfusion for anemia.  He has been found to have severe iron deficiency.  He is tolerating an iron supplement with no side effects.  He is seeing his endocrinologist for management of his blood sugar.  He reports that his blood sugar earlier today was 91 and the highest he is seen in the last few weeks was 216.  He is compliant with the insulin.  Outpatient Medications Prior to Visit  Medication Sig Dispense Refill  . blood glucose meter kit and supplies KIT Dispense based on patient and insurance preference. Use up to four times daily as directed. (FOR ICD-9 250.00, 250.01). 1 each 0  . carvedilol (COREG) 3.125 MG tablet TAKE 1 TABLET BY MOUTH TWICE DAILY. 60 tablet 1  . cetirizine (ZYRTEC) 10 MG tablet Take 10 mg by mouth daily.    Marland Kitchen ENTRESTO 49-51 MG TAKE 1 TABLET TWICE DAILY. (Patient taking differently: Take 1 tablet by mouth twice daily) 60 tablet 3  . insulin aspart (NOVOLOG FLEXPEN) 100 UNIT/ML FlexPen Add to meal coverage Sliding scale CBG 70 - 120: 0 units CBG 121 - 150: 1 unit,  CBG 151 - 200: 2 units,  CBG 201 - 250: 3 units,  CBG 251 - 300: 5 units,  CBG 301 - 350: 7 units,  CBG 351 - 400: 9 units   CBG > 400: 9 units and notify your MD (Patient taking differently: Inject 7-18 Units into the skin 3 (three) times daily with meals. Per sliding scale Add to meal coverage Sliding scale CBG 70 - 120: 0 units CBG 121 - 150: 1 unit,  CBG 151 - 200: 2 units,  CBG 201 - 250: 3 units,  CBG 251 - 300: 5 units,  CBG 301 - 350: 7 units,  CBG 351 - 400: 9 units   CBG > 400: 9 units and notify your MD) 15 mL 11  . Insulin Glargine (LANTUS) 100 UNIT/ML Solostar Pen Inject 20 Units into the skin at bedtime. (Patient taking differently: Inject 34 Units into the skin at bedtime. ) 15 mL 3  . Insulin Pen Needle 32G  X 5 MM MISC 1 each by Does not apply route at bedtime. For Solostar Pen 100 each 4  . Insulin Syringe-Needle U-100 (INSULIN SYRINGE 1CC/31GX5/16") 31G X 5/16" 1 ML MISC 1 each by Does not apply route 3 (three) times daily before meals. 100 each 6  . Iron-Folic [id number]-[id number] (FERRAPLUS 90) 90-1 MG TABS Take 1 tablet by mouth daily. 30 tablet 5  . levothyroxine (SYNTHROID, LEVOTHROID) 25 MCG tablet Take 1 tablet (25 mcg total) by mouth daily before breakfast. 30 tablet 2  . PARoxetine (PAXIL-CR) 25 MG 24 hr tablet Take 1 tablet (25 mg total) by mouth daily. 90 tablet 3  . sennosides-docusate sodium (SENOKOT-S) 8.6-50 MG tablet Take 2 tablets by mouth daily. 30 tablet 1  . thiamine (VITAMIN B-1) 100 MG tablet Take 1 tablet (100 mg total) by mouth daily. 90 tablet 1  . ciprofloxacin (CIPRO) 500 MG tablet Take 1 tablet (500 mg total) by mouth 2 (two) times daily. 14 tablet 0  . HYDROcodone-acetaminophen (NORCO) 5-325 MG tablet Take 1-2 tablets by mouth every 6 (six) hours as needed for moderate pain. MAXIMUM TOTAL ACETAMINOPHEN DOSE IS 4000 MG PER  DAY 30 tablet 0  . ondansetron (ZOFRAN) 4 MG tablet Take 1 tablet (4 mg total) by mouth every 8 (eight) hours as needed for nausea or vomiting. 30 tablet 0  . rivaroxaban (XARELTO) 10 MG TABS tablet Take 1 tablet (10 mg total) by mouth daily. 14 tablet 0  . traMADol (ULTRAM) 50 MG tablet Take 1 tablet (50 mg total) by mouth every 6 (six) hours as needed. 30 tablet 0   No facility-administered medications prior to visit.     ROS Review of Systems  Constitutional: Positive for fatigue. Negative for appetite change, chills, diaphoresis, fever and unexpected weight change.  HENT: Negative.   Eyes: Negative for visual disturbance.  Respiratory: Negative for cough, chest tightness, shortness of breath and wheezing.   Cardiovascular: Negative for chest pain, palpitations and leg swelling.  Gastrointestinal: Negative for abdominal pain, blood in stool,  constipation, diarrhea, nausea and vomiting.  Endocrine: Negative.  Negative for polydipsia, polyphagia and polyuria.  Genitourinary: Negative.  Negative for difficulty urinating, dysuria and hematuria.  Musculoskeletal: Negative.   Skin: Negative.   Allergic/Immunologic: Negative.   Neurological: Negative.  Negative for dizziness, weakness and light-headedness.  Hematological: Negative for adenopathy. Does not bruise/bleed easily.  Psychiatric/Behavioral: Negative.     Objective:  BP 110/64 (BP Location: Right Arm, Patient Position: Sitting, Cuff Size: Normal)   Pulse 86   Temp 98.6 F (37 C) (Oral)   Ht _0  (1.88 m)   Wt 206 lb (93.4 kg)   SpO2 99%   BMI 26.45 kg/m   BP Readings from Last 3 Encounters:  05/02/17 110/70  05/01/17 110/64  04/25/17 104/73    Wt Readings from Last 3 Encounters:  05/02/17 205 lb 12.8 oz (93.4 kg)  05/01/17 206 lb (93.4 kg)  04/25/17 213 lb 1.6 oz (96.7 kg)    Physical Exam  Constitutional: He is oriented to person, place, and time. No distress.  HENT:  Mouth/Throat: Oropharynx is clear and moist. Mucous membranes are pale, not dry and not cyanotic. No oropharyngeal exudate.  Eyes: Conjunctivae are normal. Right eye exhibits no discharge. Left eye exhibits no discharge. No scleral icterus.  Neck: Normal range of motion. Neck supple. No JVD present. No thyromegaly present.  Cardiovascular: Normal rate, regular rhythm, normal heart sounds and intact distal pulses.  No murmur heard. Pulmonary/Chest: Effort normal and breath sounds normal. No respiratory distress. He has no wheezes. He has no rales.  Abdominal: Soft. Bowel sounds are normal. He exhibits no distension and no mass. There is no tenderness. There is no guarding.  Musculoskeletal: Normal range of motion. He exhibits no edema, tenderness or deformity.  Neurological: He is alert and oriented to person, place, and time.  Skin: Skin is warm and dry. No rash noted. He is not  diaphoretic. No erythema. No pallor.  Vitals reviewed.   Lab Results  Component Value Date   WBC 6.8 04/28/2017   HGB 10.6 (L) 04/28/2017   HCT 34.0 (L) 04/28/2017   PLT 539 (H) 04/28/2017   GLUCOSE 178 (H) 04/28/2017   CHOL 147 02/17/2017   TRIG 86.0 02/17/2017   HDL 30.50 (L) 02/17/2017   LDLDIRECT 117.5 08/20/2011   LDLCALC 99 02/17/2017   ALT 10 04/28/2017   AST 12 04/28/2017   NA 139 04/28/2017   K 4.2 04/28/2017   CL 103 03/20/2017   CREATININE 1.0 04/28/2017   BUN 15.5 04/28/2017   CO2 30 (H) 04/28/2017   TSH 2.781 04/28/2017   PSA 6.44 (H)  02/17/2017   INR 1.20 (L) 04/28/2017   HGBA1C 7.5 (H) 01/24/2017   MICROALBUR 4.7 (H) 02/17/2017    No results found.  Assessment & Plan:   Srihan was seen today for follow-up.  Diagnoses and all orders for this visit:  Iron deficiency anemia due to chronic blood loss- Will continue oral iron replacement for now.  He sees his oncologist in the next few days and will consider whether or not to receive an iron infusion.  Manifestations of thiamine deficiency- Will continue thiamine replacement therapy.  Essential hypertension- His blood pressure is well controlled.  Recent electrolytes and renal function are normal.   I have discontinued Cristal Deer. Kuehne's ciprofloxacin, ondansetron, rivaroxaban, HYDROcodone-acetaminophen, and traMADol. I am also having him maintain his cetirizine, PARoxetine, Insulin Pen Needle, INSULIN SYRINGE 1CC/31GX5/16", blood glucose meter kit and supplies, Insulin Glargine, insulin aspart, FERRAPLUS 90, thiamine, ENTRESTO, levothyroxine, sennosides-docusate sodium, and carvedilol.  No orders of the defined types were placed in this encounter.    Follow-up: No Follow-up on file.  Scarlette Calico, MD

## 2017-05-02 ENCOUNTER — Ambulatory Visit (HOSPITAL_BASED_OUTPATIENT_CLINIC_OR_DEPARTMENT_OTHER): Payer: BLUE CROSS/BLUE SHIELD | Admitting: Oncology

## 2017-05-02 ENCOUNTER — Encounter: Payer: Self-pay | Admitting: Oncology

## 2017-05-02 VITALS — BP 110/70 | HR 89 | Temp 98.7°F | Resp 18 | Ht 74.0 in | Wt 205.8 lb

## 2017-05-02 DIAGNOSIS — E119 Type 2 diabetes mellitus without complications: Secondary | ICD-10-CM | POA: Diagnosis not present

## 2017-05-02 DIAGNOSIS — M25661 Stiffness of right knee, not elsewhere classified: Secondary | ICD-10-CM | POA: Diagnosis not present

## 2017-05-02 DIAGNOSIS — M25562 Pain in left knee: Secondary | ICD-10-CM | POA: Diagnosis not present

## 2017-05-02 DIAGNOSIS — C649 Malignant neoplasm of unspecified kidney, except renal pelvis: Secondary | ICD-10-CM

## 2017-05-02 DIAGNOSIS — M25579 Pain in unspecified ankle and joints of unspecified foot: Secondary | ICD-10-CM | POA: Diagnosis not present

## 2017-05-02 DIAGNOSIS — R911 Solitary pulmonary nodule: Secondary | ICD-10-CM

## 2017-05-02 DIAGNOSIS — D649 Anemia, unspecified: Secondary | ICD-10-CM | POA: Diagnosis not present

## 2017-05-02 DIAGNOSIS — M25561 Pain in right knee: Secondary | ICD-10-CM | POA: Diagnosis not present

## 2017-05-02 DIAGNOSIS — M6281 Muscle weakness (generalized): Secondary | ICD-10-CM | POA: Diagnosis not present

## 2017-05-02 DIAGNOSIS — Z794 Long term (current) use of insulin: Secondary | ICD-10-CM

## 2017-05-02 DIAGNOSIS — I429 Cardiomyopathy, unspecified: Secondary | ICD-10-CM

## 2017-05-02 NOTE — Progress Notes (Signed)
Spoke w/ pt regarding his copay assistance w/ [employer] for Opdivo since it will expire on 05/26/17.  I informed him that [employer] will automatically re enroll him for 2019 and will do a benefits investigation on 06/06/17.  Pt verbalized understanding and sd he will let me know if he will be on Opdivo in 2019.

## 2017-05-02 NOTE — Progress Notes (Signed)
Hematology and Oncology Follow Up Visit  ALAND CHESTNUTT [id number] [birth year] 56 y.o. 05/02/2017 1:27 PM [name], MDJones, Arvid Right, MD   Principle Diagnosis: 56 year old gentleman with renal cell cancer diagnosed in August 2017. He was found to have pulmonary nodules due to metastatic disease.   Prior Therapy: He is status post radical nephrectomy done on 02/07/2016 at [facility]. 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 The measurements of the tumor is 9.9 x 9.0 x 9.0 cm. He did have small pulmonary nodules and a repeat scan on 03/25/2016 showed a 0.6 mm left lower lobe nodule previously measuring 3 mm. There is a 1 cm left lower lobe nodule previously was 0.5.  Current therapy:  Nivolumab 240 mg every 2 weeks started on 06/07/2016. This is his first-line therapy chosen because of cardiomyopathy. He is here for evaluation before the next treatment. Therapy discontinued on April 25, 2017 for potential complications.  Interim History: Mr. Doswell presents today for a follow-up visit. Since the last visit, he received 2 units of packed red cell transfusion with improvement in his overall symptoms.  He reports his performance status and activity level has improved.  His appetite still an issue although he feels he is eating slightly better.  He continues to have ankle pain and stiffness.  It is unclear if he has swelling in his ankle but certainly pain that improves as the day progresses.  He still able to drive and participate in physical therapy which have helped with his mobility.  He does not report any headaches or blurry vision, syncope or seizures. He does not report any fevers, chills, sweats or weight loss. His appetite is excellent and have gained weight. He does not report any chest pain, palpitation, orthopnea or leg edema. He does not report any cough, wheezing or hemoptysis. He does not report any nausea, vomiting or abdominal  pain. He does not report any frequency urgency or hesitancy. Remaining review of systems unremarkable.   Medications: I have reviewed the patient's current medications.  Current Outpatient Medications  Medication Sig Dispense Refill  . blood glucose meter kit and supplies KIT Dispense based on patient and insurance preference. Use up to four times daily as directed. (FOR ICD-9 250.00, 250.01). 1 each 0  . carvedilol (COREG) 3.125 MG tablet TAKE 1 TABLET BY MOUTH TWICE DAILY. 60 tablet 1  . cetirizine (ZYRTEC) 10 MG tablet Take 10 mg by mouth daily.    Marland Kitchen ENTRESTO 49-51 MG TAKE 1 TABLET TWICE DAILY. (Patient taking differently: Take 1 tablet by mouth twice daily) 60 tablet 3  . insulin aspart (NOVOLOG FLEXPEN) 100 UNIT/ML FlexPen Add to meal coverage Sliding scale CBG 70 - 120: 0 units CBG 121 - 150: 1 unit,  CBG 151 - 200: 2 units,  CBG 201 - 250: 3 units,  CBG 251 - 300: 5 units,  CBG 301 - 350: 7 units,  CBG 351 - 400: 9 units   CBG > 400: 9 units and notify your MD (Patient taking differently: Inject 7-18 Units into the skin 3 (three) times daily with meals. Per sliding scale Add to meal coverage Sliding scale CBG 70 - 120: 0 units CBG 121 - 150: 1 unit,  CBG 151 - 200: 2 units,  CBG 201 - 250: 3 units,  CBG 251 - 300: 5 units,  CBG 301 - 350: 7 units,  CBG 351 - 400: 9 units   CBG > 400:  9 units and notify your MD) 15 mL 11  . Insulin Glargine (LANTUS) 100 UNIT/ML Solostar Pen Inject 20 Units into the skin at bedtime. (Patient taking differently: Inject 34 Units into the skin at bedtime. ) 15 mL 3  . Insulin Pen Needle 32G X 5 MM MISC 1 each by Does not apply route at bedtime. For Solostar Pen 100 each 4  . Insulin Syringe-Needle U-100 (INSULIN SYRINGE 1CC/31GX5/16") 31G X 5/16" 1 ML MISC 1 each by Does not apply route 3 (three) times daily before meals. 100 each 6  . Iron-Folic [id number] (FERRAPLUS 90) 90-1 MG TABS Take 1 tablet by mouth daily. 30 tablet 5  . levothyroxine (SYNTHROID,  LEVOTHROID) 25 MCG tablet Take 1 tablet (25 mcg total) by mouth daily before breakfast. 30 tablet 2  . PARoxetine (PAXIL-CR) 25 MG 24 hr tablet Take 1 tablet (25 mg total) by mouth daily. 90 tablet 3  . sennosides-docusate sodium (SENOKOT-S) 8.6-50 MG tablet Take 2 tablets by mouth daily. 30 tablet 1  . thiamine (VITAMIN B-1) 100 MG tablet Take 1 tablet (100 mg total) by mouth daily. 90 tablet 1   No current facility-administered medications for this visit.      Allergies: No Known Allergies  Past Medical History, Surgical history, Social history, and Family History were reviewed and updated.   Physical Exam: Blood pressure 110/70, pulse 89, temperature 98.7 F (37.1 C), temperature source Oral, resp. rate 18, height _0  (1.88 m), weight 205 lb 12.8 oz (93.4 kg), SpO2 100 %. ECOG: 1 General appearance: Alert, awake gentleman appeared without distress today. Head: Normocephalic, without obvious abnormality no oral ulcers or lesions. Neck: no adenopathy no thyroid lesions. Lymph nodes: Cervical, supraclavicular, and axillary nodes normal. Heart:regular rate and rhythm, S1, S2 normal, no murmur, click, rub or gallop Lung:chest clear, no wheezing, rales, normal symmetric air entry Abdomin: soft, non-tender, without masses or organomegaly no shifting dullness or ascites. EXT: His ankles showed slight edema.  No erythema or warmth noted.  Nontender to touch. Neurological examination: No deficits noted.  Lab Results: Lab Results  Component Value Date   WBC 6.8 04/28/2017   HGB 10.6 (L) 04/28/2017   HCT 34.0 (L) 04/28/2017   MCV 80.3 04/28/2017   PLT 539 (H) 04/28/2017     Chemistry      Component Value Date/Time   NA 139 04/28/2017 1330   K 4.2 04/28/2017 1330   CL 103 03/20/2017 1106   CO2 30 (H) 04/28/2017 1330   BUN 15.5 04/28/2017 1330   CREATININE 1.0 04/28/2017 1330      Component Value Date/Time   CALCIUM 9.7 04/28/2017 1330   ALKPHOS 73 04/28/2017 1330   AST 12  04/28/2017 1330   ALT 10 04/28/2017 1330   BILITOT 0.61 04/28/2017 1330      CT scan of the chest on 03/06/2017 obtained at [facility]: There is no mediastinal, hilar or axillary adenopathy. There is an increasing enhancing pulmonary parenchymal metastasis in the subpleural left lower lobe measuring 2.5 cm, previously 2 cm (series 3 image 87). Stable punctate subcentimeter middle lobe lung nodule (series 4 image 70).  No pleural or pericardial effusion.  No suspicious osseous lesions.   Impression and Plan: 56 year old with the following issues:  1. 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 He is status post radical nephrectomy done on 02/07/2016 at [facility]. 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 The measurements  of the tumor is 9.9 x 9.0 x 9.0 cm. He did have small pulmonary nodules   He is currently receiving Nivolumab 240 mg every 2 weeks started on 06/07/2016.   CT scan obtained in April 2018 at [facility] indicate stable disease.   CT scan obtained on 12/02/2016 was reviewed and showed stable disease.  CT scan obtained on March 06, 2017 showed slight progression of disease with slight increase in his pulmonary nodules.  Nivolumab will continue to be on hold at this time until further notice.  2.  Anemia: His workup completed in the last week was reviewed today.  He has normal P79 and folic acid.  His reticulocyte count was normal.  His iron level is 12 with saturation of 7%.  His ferritin is elevated at 1500 but unfortunately that was done after transfusion.  He received 2 units of packed red cell transfusion with a hemoglobin is up to 10.6.  He is currently on iron sulfate which he tolerates well and I encouraged him to continue that.  Intravenous iron can be used in the future if needed 2.  3. Cardiomyopathy: Continues to follow with cardiology and his cardiac medications are  optimized. His EF continues to be close to 20%.  4. Thyroid function surveillance: His thyroid panel was reviewed and was within normal range.  5. Diabetes: His currently on insulin and followed by his primary care physician as well as endocrinology. His blood sugar under reasonable control.  6.  Weight loss and low protein: Multifactorial in nature.  Related to his malignancy and possibly cancer treatment.  He is eating better and his albumin has improved currently to 2.7.  7.  Evaluation for endocrinopathy: His rest of his endocrine workup has been unrevealing at this time including a normal ACTH and thyroid function.  8.  Ankle pain: Autoimmune arthritis is a possibility at this time.  I have recommended obtaining an autoimmune panel including ANA, rheumatoid arthritis and possible referral for rheumatology.  Risks and benefits of using a steroid taper was discussed today again in detail.  Complications associated with this approach include hyperglycemia which could be problematic given his issues with her control.  After discussion today, he deferred this option for the time being.  He is traveling to [facility] to meet with his primary oncologist there and will let me know if he opted to proceed with a trial of steroids at the time.   9. Follow-up: Will be on 05/23/2017 sooner if needed 2.   Zola Button, MD 12/7/20181:27 PM

## 2017-05-07 ENCOUNTER — Encounter: Payer: Self-pay | Admitting: Internal Medicine

## 2017-05-07 DIAGNOSIS — Z7984 Long term (current) use of oral hypoglycemic drugs: Secondary | ICD-10-CM | POA: Diagnosis not present

## 2017-05-07 DIAGNOSIS — C641 Malignant neoplasm of right kidney, except renal pelvis: Secondary | ICD-10-CM | POA: Diagnosis not present

## 2017-05-07 DIAGNOSIS — K769 Liver disease, unspecified: Secondary | ICD-10-CM | POA: Diagnosis not present

## 2017-05-07 DIAGNOSIS — C649 Malignant neoplasm of unspecified kidney, except renal pelvis: Secondary | ICD-10-CM | POA: Diagnosis not present

## 2017-05-07 DIAGNOSIS — C7802 Secondary malignant neoplasm of left lung: Secondary | ICD-10-CM | POA: Diagnosis not present

## 2017-05-07 DIAGNOSIS — Z79899 Other long term (current) drug therapy: Secondary | ICD-10-CM | POA: Diagnosis not present

## 2017-05-07 DIAGNOSIS — E0965 Drug or chemical induced diabetes mellitus with hyperglycemia: Secondary | ICD-10-CM | POA: Diagnosis not present

## 2017-05-07 DIAGNOSIS — Z794 Long term (current) use of insulin: Secondary | ICD-10-CM | POA: Diagnosis not present

## 2017-05-07 DIAGNOSIS — E1065 Type 1 diabetes mellitus with hyperglycemia: Secondary | ICD-10-CM | POA: Diagnosis not present

## 2017-05-07 DIAGNOSIS — D509 Iron deficiency anemia, unspecified: Secondary | ICD-10-CM | POA: Diagnosis not present

## 2017-05-07 DIAGNOSIS — I10 Essential (primary) hypertension: Secondary | ICD-10-CM | POA: Diagnosis not present

## 2017-05-07 NOTE — Patient Instructions (Signed)
Anemia, Nonspecific Anemia is a condition in which the concentration of red blood cells or hemoglobin in the blood is below normal. Hemoglobin is a substance in red blood cells that carries oxygen to the tissues of the body. Anemia results in not enough oxygen reaching these tissues. What are the causes? Common causes of anemia include:  Excessive bleeding. Bleeding may be internal or external. This includes excessive bleeding from periods (in women) or from the intestine.  Poor nutrition.  Chronic kidney, thyroid, and liver disease.  Bone marrow disorders that decrease red blood cell production.  Cancer and treatments for cancer.  HIV, AIDS, and their treatments.  Spleen problems that increase red blood cell destruction.  Blood disorders.  Excess destruction of red blood cells due to infection, medicines, and autoimmune disorders. What are the signs or symptoms?  Minor weakness.  Dizziness.  Headache.  Palpitations.  Shortness of breath, especially with exercise.  Paleness.  Cold sensitivity.  Indigestion.  Nausea.  Difficulty sleeping.  Difficulty concentrating. Symptoms may occur suddenly or they may develop slowly. How is this diagnosed? Additional blood tests are often needed. These help your health care provider determine the best treatment. Your health care provider will check your stool for blood and look for other causes of blood loss. How is this treated? Treatment varies depending on the cause of the anemia. Treatment can include:  Supplements of iron, vitamin B12, or folic acid.  Hormone medicines.  A blood transfusion. This may be needed if blood loss is severe.  Hospitalization. This may be needed if there is significant continual blood loss.  Dietary changes.  Spleen removal. Follow these instructions at home: Keep all follow-up appointments. It often takes many weeks to correct anemia, and having your health care provider check on your  condition and your response to treatment is very important. Get help right away if:  You develop extreme weakness, shortness of breath, or chest pain.  You become dizzy or have trouble concentrating.  You develop heavy vaginal bleeding.  You develop a rash.  You have bloody or black, tarry stools.  You faint.  You vomit up blood.  You vomit repeatedly.  You have abdominal pain.  You have a fever or persistent symptoms for more than 2-3 days.  You have a fever and your symptoms suddenly get worse.  You are dehydrated. This information is not intended to replace advice given to you by your health care provider. Make sure you discuss any questions you have with your health care provider. Document Released: 06/20/2004 Document Revised: 10/25/2015 Document Reviewed: 11/06/2012 Elsevier Interactive Patient Education  2017 Elsevier Inc.  

## 2017-05-08 DIAGNOSIS — R600 Localized edema: Secondary | ICD-10-CM | POA: Diagnosis not present

## 2017-05-08 DIAGNOSIS — C649 Malignant neoplasm of unspecified kidney, except renal pelvis: Secondary | ICD-10-CM | POA: Diagnosis not present

## 2017-05-08 DIAGNOSIS — M79661 Pain in right lower leg: Secondary | ICD-10-CM | POA: Diagnosis not present

## 2017-05-08 DIAGNOSIS — K769 Liver disease, unspecified: Secondary | ICD-10-CM | POA: Diagnosis not present

## 2017-05-08 DIAGNOSIS — M2550 Pain in unspecified joint: Secondary | ICD-10-CM | POA: Diagnosis not present

## 2017-05-08 DIAGNOSIS — C641 Malignant neoplasm of right kidney, except renal pelvis: Secondary | ICD-10-CM | POA: Diagnosis not present

## 2017-05-08 DIAGNOSIS — D509 Iron deficiency anemia, unspecified: Secondary | ICD-10-CM | POA: Diagnosis not present

## 2017-05-08 DIAGNOSIS — C7802 Secondary malignant neoplasm of left lung: Secondary | ICD-10-CM | POA: Diagnosis not present

## 2017-05-09 DIAGNOSIS — C7802 Secondary malignant neoplasm of left lung: Secondary | ICD-10-CM | POA: Diagnosis not present

## 2017-05-09 DIAGNOSIS — Z79899 Other long term (current) drug therapy: Secondary | ICD-10-CM | POA: Diagnosis not present

## 2017-05-09 DIAGNOSIS — Z7952 Long term (current) use of systemic steroids: Secondary | ICD-10-CM | POA: Diagnosis not present

## 2017-05-09 DIAGNOSIS — Z79890 Hormone replacement therapy (postmenopausal): Secondary | ICD-10-CM | POA: Diagnosis not present

## 2017-05-09 DIAGNOSIS — C649 Malignant neoplasm of unspecified kidney, except renal pelvis: Secondary | ICD-10-CM | POA: Diagnosis not present

## 2017-05-09 DIAGNOSIS — Z794 Long term (current) use of insulin: Secondary | ICD-10-CM | POA: Diagnosis not present

## 2017-05-10 DIAGNOSIS — D509 Iron deficiency anemia, unspecified: Secondary | ICD-10-CM | POA: Diagnosis not present

## 2017-05-10 DIAGNOSIS — C641 Malignant neoplasm of right kidney, except renal pelvis: Secondary | ICD-10-CM | POA: Diagnosis not present

## 2017-05-10 DIAGNOSIS — C649 Malignant neoplasm of unspecified kidney, except renal pelvis: Secondary | ICD-10-CM | POA: Diagnosis not present

## 2017-05-10 DIAGNOSIS — M2550 Pain in unspecified joint: Secondary | ICD-10-CM | POA: Diagnosis not present

## 2017-05-12 DIAGNOSIS — C7989 Secondary malignant neoplasm of other specified sites: Secondary | ICD-10-CM | POA: Diagnosis not present

## 2017-05-12 DIAGNOSIS — E291 Testicular hypofunction: Secondary | ICD-10-CM | POA: Diagnosis not present

## 2017-05-12 DIAGNOSIS — E1043 Type 1 diabetes mellitus with diabetic autonomic (poly)neuropathy: Secondary | ICD-10-CM | POA: Diagnosis not present

## 2017-05-12 DIAGNOSIS — E1065 Type 1 diabetes mellitus with hyperglycemia: Secondary | ICD-10-CM | POA: Diagnosis not present

## 2017-05-12 DIAGNOSIS — M170 Bilateral primary osteoarthritis of knee: Secondary | ICD-10-CM | POA: Diagnosis not present

## 2017-05-12 DIAGNOSIS — M25461 Effusion, right knee: Secondary | ICD-10-CM | POA: Diagnosis not present

## 2017-05-12 DIAGNOSIS — M19071 Primary osteoarthritis, right ankle and foot: Secondary | ICD-10-CM | POA: Diagnosis not present

## 2017-05-12 DIAGNOSIS — M2550 Pain in unspecified joint: Secondary | ICD-10-CM | POA: Diagnosis not present

## 2017-05-12 DIAGNOSIS — M25531 Pain in right wrist: Secondary | ICD-10-CM | POA: Diagnosis not present

## 2017-05-12 DIAGNOSIS — K3184 Gastroparesis: Secondary | ICD-10-CM | POA: Diagnosis not present

## 2017-05-12 DIAGNOSIS — M25522 Pain in left elbow: Secondary | ICD-10-CM | POA: Diagnosis not present

## 2017-05-12 DIAGNOSIS — M25532 Pain in left wrist: Secondary | ICD-10-CM | POA: Diagnosis not present

## 2017-05-12 DIAGNOSIS — M25561 Pain in right knee: Secondary | ICD-10-CM | POA: Diagnosis not present

## 2017-05-12 DIAGNOSIS — M19072 Primary osteoarthritis, left ankle and foot: Secondary | ICD-10-CM | POA: Diagnosis not present

## 2017-05-12 DIAGNOSIS — C649 Malignant neoplasm of unspecified kidney, except renal pelvis: Secondary | ICD-10-CM | POA: Diagnosis not present

## 2017-05-12 DIAGNOSIS — E069 Thyroiditis, unspecified: Secondary | ICD-10-CM | POA: Diagnosis not present

## 2017-05-12 DIAGNOSIS — I10 Essential (primary) hypertension: Secondary | ICD-10-CM | POA: Diagnosis not present

## 2017-05-12 DIAGNOSIS — C641 Malignant neoplasm of right kidney, except renal pelvis: Secondary | ICD-10-CM | POA: Diagnosis not present

## 2017-05-12 DIAGNOSIS — D509 Iron deficiency anemia, unspecified: Secondary | ICD-10-CM | POA: Diagnosis not present

## 2017-05-12 DIAGNOSIS — M25521 Pain in right elbow: Secondary | ICD-10-CM | POA: Diagnosis not present

## 2017-05-13 DIAGNOSIS — C649 Malignant neoplasm of unspecified kidney, except renal pelvis: Secondary | ICD-10-CM | POA: Diagnosis not present

## 2017-05-13 DIAGNOSIS — M23306 Other meniscus derangements, unspecified meniscus, right knee: Secondary | ICD-10-CM | POA: Diagnosis not present

## 2017-05-13 DIAGNOSIS — M2550 Pain in unspecified joint: Secondary | ICD-10-CM | POA: Diagnosis not present

## 2017-05-13 DIAGNOSIS — E1065 Type 1 diabetes mellitus with hyperglycemia: Secondary | ICD-10-CM | POA: Diagnosis not present

## 2017-05-14 DIAGNOSIS — C649 Malignant neoplasm of unspecified kidney, except renal pelvis: Secondary | ICD-10-CM | POA: Diagnosis not present

## 2017-05-14 DIAGNOSIS — M2550 Pain in unspecified joint: Secondary | ICD-10-CM | POA: Diagnosis not present

## 2017-05-14 DIAGNOSIS — E1065 Type 1 diabetes mellitus with hyperglycemia: Secondary | ICD-10-CM | POA: Diagnosis not present

## 2017-05-14 DIAGNOSIS — M23306 Other meniscus derangements, unspecified meniscus, right knee: Secondary | ICD-10-CM | POA: Diagnosis not present

## 2017-05-15 DIAGNOSIS — E039 Hypothyroidism, unspecified: Secondary | ICD-10-CM | POA: Diagnosis not present

## 2017-05-15 DIAGNOSIS — Z794 Long term (current) use of insulin: Secondary | ICD-10-CM | POA: Diagnosis not present

## 2017-05-15 DIAGNOSIS — E109 Type 1 diabetes mellitus without complications: Secondary | ICD-10-CM | POA: Diagnosis not present

## 2017-05-16 DIAGNOSIS — E109 Type 1 diabetes mellitus without complications: Secondary | ICD-10-CM | POA: Diagnosis not present

## 2017-05-19 ENCOUNTER — Ambulatory Visit (HOSPITAL_COMMUNITY)
Admit: 2017-05-19 | Discharge: 2017-05-19 | Disposition: A | Discharge status: Home / Self Care | Payer: BLUE CROSS/BLUE SHIELD | Attending: Internal Medicine | Admitting: Internal Medicine

## 2017-05-19 ENCOUNTER — Other Ambulatory Visit: Payer: Self-pay

## 2017-05-19 ENCOUNTER — Encounter (HOSPITAL_COMMUNITY): Payer: Self-pay | Admitting: Internal Medicine

## 2017-05-19 VITALS — BP 122/74 | HR 83 | Wt 209.8 lb

## 2017-05-19 DIAGNOSIS — Z79899 Other long term (current) drug therapy: Secondary | ICD-10-CM | POA: Insufficient documentation

## 2017-05-19 DIAGNOSIS — E781 Pure hyperglyceridemia: Secondary | ICD-10-CM | POA: Diagnosis not present

## 2017-05-19 DIAGNOSIS — F419 Anxiety disorder, unspecified: Secondary | ICD-10-CM | POA: Diagnosis not present

## 2017-05-19 DIAGNOSIS — Z794 Long term (current) use of insulin: Secondary | ICD-10-CM | POA: Insufficient documentation

## 2017-05-19 DIAGNOSIS — Z85528 Personal history of other malignant neoplasm of kidney: Secondary | ICD-10-CM | POA: Insufficient documentation

## 2017-05-19 DIAGNOSIS — R918 Other nonspecific abnormal finding of lung field: Secondary | ICD-10-CM | POA: Diagnosis not present

## 2017-05-19 DIAGNOSIS — I5022 Chronic systolic (congestive) heart failure: Secondary | ICD-10-CM

## 2017-05-19 DIAGNOSIS — I10 Essential (primary) hypertension: Secondary | ICD-10-CM | POA: Insufficient documentation

## 2017-05-19 DIAGNOSIS — Z905 Acquired absence of kidney: Secondary | ICD-10-CM | POA: Diagnosis not present

## 2017-05-19 DIAGNOSIS — I420 Dilated cardiomyopathy: Secondary | ICD-10-CM | POA: Insufficient documentation

## 2017-05-19 DIAGNOSIS — M25461 Effusion, right knee: Secondary | ICD-10-CM | POA: Diagnosis not present

## 2017-05-19 DIAGNOSIS — E1140 Type 2 diabetes mellitus with diabetic neuropathy, unspecified: Secondary | ICD-10-CM | POA: Insufficient documentation

## 2017-05-19 DIAGNOSIS — I2510 Atherosclerotic heart disease of native coronary artery without angina pectoris: Secondary | ICD-10-CM | POA: Insufficient documentation

## 2017-05-19 DIAGNOSIS — I429 Cardiomyopathy, unspecified: Secondary | ICD-10-CM | POA: Diagnosis not present

## 2017-05-19 DIAGNOSIS — H3530 Unspecified macular degeneration: Secondary | ICD-10-CM | POA: Diagnosis not present

## 2017-05-19 DIAGNOSIS — M25561 Pain in right knee: Secondary | ICD-10-CM | POA: Diagnosis not present

## 2017-05-19 MED ORDER — CARVEDILOL 6.25 MG PO TABS: 6.2500 mg | ORAL_TABLET | Freq: Two times a day (BID) | ORAL | 6 refills | Status: DC

## 2017-05-19 NOTE — Progress Notes (Signed)
ADVANCED HF CLINIC NOTE  Referring Physician:  Lennie Hummer MD  Primary Cardiologist: None  HPI:  Gregory [name] is a 56 y/o male with h/o obesity, metastatic R renal cell CA s/p R nephrectomy in 9/17 with the subsequent discovery of 2 pulmonary nodules whom we follow for non-ischemic cardiomyopathy.  Developed microscopic hematuria and found to have large right renal mass c/w renal cell CA. Eventually underwent right radical nephrectomy with adrenal sparing on 02/07/16 at [facility]. Post-op developed bradycardia with HRs in 30s while sleeping. BP stable. When awakened had dizziness so given atropine. ECG revealed sinus brady in 40s with mild QT prolongation which resolved. Echo done and EF 45% with global HK. RV dilated  With normal function .  We saw him for the first time in October 2017. At that time we ordered cMRI and stress test. However in the interim he went back to [facility]. Found to have 2 lung nodules and wanted to start chemo. Echo showed EF at 46%. However MUGA with EF 28% so referred back to [place] for further evaluation prior to chemo. Underwent cath 04/09/16 as below.   1. 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 Otherwise normal coronaries 3. Nonischemic, dilated cardiomyopathy with EF 30-35% and global hypokinesis  cMRI 05/29/16 EF 28% No LGE  Had sleep study recently was negative.   Here today for regular follow-up. Immunotherapy (nivolumab) stopped about 3 weeks ago. Was pending surgical eval but pulmonary nodules stable. On 03/20/17 underwent R knee arthoscopy. Post-op developed pain and inflammation in numerous joints. Now seeing rheumatologist at [facility]. It is not gout or infection - felt to be related immunotherapy.. Improving with steroids. Sugars up but following with Dr. Buddy Duty who is managing. Denies CP or SOB no LE edema. Has lost weight but now gaining back. BP has been low recently but getting  better. On Entresto 49/51 and carvedilol 3.125.   ECHO 09/12/16 35-40%, RV ok. - Reviewed personally   FHx:   Father died at 80 from HF (unknown CAD status) Mother died 28 from brain cancer Sister fine    Past Medical History:  Diagnosis Date  . Allergy   . Anemia    low iron  . Anxiety   . Cancer [facility])    Renal cell cancer  . Complex tear of lateral meniscus of right knee as current injury 03/20/2017  . Complex tear of medial meniscus of right knee 03/20/2017  . Diabetes mellitus without complication ([place])   . History of gastrointestinal hemorrhage   . Hypertension   . Hypertriglyceridemia   . Macular degeneration    lasar surgery- Dr Zigmund Laconda Basich  . Neuropathy   . Nonischemic cardiomyopathy ([place])    EF 25-30% by echo 09/12/16  . Plantar warts   . PONV (postoperative nausea and vomiting)    " a little bit of nausea"  . Primary localized osteoarthritis of right knee 03/20/2017  . Renal cell carcinoma [facility])    s/p nephrectomy 02/07/16 at [facility])    Current Outpatient Medications  Medication Sig Dispense Refill  . blood glucose meter kit and supplies KIT Dispense based on patient and insurance preference. Use up to four times daily as directed. (FOR ICD-9 250.00, 250.01). 1 each 0  . carvedilol (COREG) 3.125 MG tablet TAKE 1 TABLET BY MOUTH TWICE DAILY. 60 tablet 1  . cetirizine (ZYRTEC) 10 MG tablet Take 10 mg by mouth daily.    Marland Kitchen  ENTRESTO 49-51 MG TAKE 1 TABLET TWICE DAILY. 60 tablet 3  . insulin aspart (NOVOLOG FLEXPEN) 100 UNIT/ML FlexPen Add to meal coverage Sliding scale CBG 70 - 120: 0 units CBG 121 - 150: 1 unit,  CBG 151 - 200: 2 units,  CBG 201 - 250: 3 units,  CBG 251 - 300: 5 units,  CBG 301 - 350: 7 units,  CBG 351 - 400: 9 units   CBG > 400: 9 units and notify your MD (Patient taking differently: Inject 7-18 Units into the skin 3 (three) times daily with meals. Per sliding scale Add to meal coverage Sliding scale CBG 70 - 120: 0 units CBG 121 - 150: 1 unit,   CBG 151 - 200: 2 units,  CBG 201 - 250: 3 units,  CBG 251 - 300: 5 units,  CBG 301 - 350: 7 units,  CBG 351 - 400: 9 units   CBG > 400: 9 units and notify your MD) 15 mL 11  . Insulin Glargine (LANTUS) 100 UNIT/ML Solostar Pen Inject 20 Units into the skin at bedtime. (Patient taking differently: Inject 34 Units into the skin at bedtime. ) 15 mL 3  . Insulin Pen Needle 32G X 5 MM MISC 1 each by Does not apply route at bedtime. For Solostar Pen 100 each 4  . Insulin Syringe-Needle U-100 (INSULIN SYRINGE 1CC/31GX5/16") 31G X 5/16" 1 ML MISC 1 each by Does not apply route 3 (three) times daily before meals. 100 each 6  . Iron-Folic [id number] (FERRAPLUS 90) 90-1 MG TABS Take 1 tablet by mouth daily. 30 tablet 5  . levothyroxine (SYNTHROID, LEVOTHROID) 25 MCG tablet Take 1 tablet (25 mcg total) by mouth daily before breakfast. 30 tablet 2  . PARoxetine (PAXIL-CR) 25 MG 24 hr tablet Take 1 tablet (25 mg total) by mouth daily. 90 tablet 3  . predniSONE (DELTASONE) 5 MG tablet Take 15 mg by mouth daily with breakfast.    . sennosides-docusate sodium (SENOKOT-S) 8.6-50 MG tablet Take 2 tablets by mouth daily. 30 tablet 1  . thiamine (VITAMIN B-1) 100 MG tablet Take 1 tablet (100 mg total) by mouth daily. 90 tablet 1   No current facility-administered medications for this encounter.     No Known Allergies    Social History   Socioeconomic History  . Marital status: Married    Spouse name: Not on file  . Number of children: Not on file  . Years of education: Not on file  . Highest education level: Not on file  Social Needs  . Financial resource strain: Not on file  . Food insecurity - worry: Not on file  . Food insecurity - inability: Not on file  . Transportation needs - medical: Not on file  . Transportation needs - non-medical: Not on file  Occupational History  . Not on file  Tobacco Use  . Smoking status: Never Smoker  . Smokeless tobacco: Never Used  Substance and Sexual  Activity  . Alcohol use: Yes    Alcohol/week: 0.0 oz    Comment: social  . Drug use: No  . Sexual activity: Not on file  Other Topics Concern  . 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       Family History  Problem Relation Age  of Onset  . Hypertension Mother   . Bipolar disorder Mother   . Cancer Mother 23       Brain  . Other Father        brain tumor  . Colon cancer Neg Hx   . Colon polyps Neg Hx   . Rectal cancer Neg Hx   . Stomach cancer Neg Hx   . Early death Neg Hx   . Hyperlipidemia Neg Hx   . Kidney disease Neg Hx     Vitals:   05/19/17 1150  BP: 122/74  Pulse: 83  SpO2: 100%  Weight: 209 lb 12 oz (95.1 kg)   Wt Readings from Last 3 Encounters:  05/19/17 209 lb 12 oz (95.1 kg)  05/02/17 205 lb 12.8 oz (93.4 kg)  05/01/17 206 lb (93.4 kg)    PHYSICAL EXAM: General:  Well appearing. No resp difficulty HEENT: normal Neck: supple. no JVD. Carotids 2+ bilat; no bruits. No lymphadenopathy or thryomegaly appreciated. Cor: PMI nondisplaced. Regular rate & rhythm. No rubs, gallops or murmurs. Lungs: clear Abdomen: soft, nontender, nondistended. No hepatosplenomegaly. No bruits or masses. Good bowel sounds. Extremities: no cyanosis, clubbing, rash, edema Neuro: alert & orientedx3, cranial nerves grossly intact. moves all 4 extremities w/o difficulty. Affect pleasant   ECG: Sinus brady 42 QRS 96 QTC 479m (02/07/16)              NSR 76 QRS 80 QTc 459 No ST-T wave abnormalities. (today)   ASSESSMENT & PLAN: 1. NICM:     -Unclear etiology. EF 30-35% by cath with NICM. cMRI 1/18 with EF 28%. Etiology remains unclear. No evidence of scar of infiltrative process on MRI.  -- Echo 4/18 EF 35-40%  -  Stable NYHA I-II, volume status looks good  -  Increase carvedilol to 6.25 bid -  Continue  Entresto 49/51 mg BID.  -  [facility].  -  Repeat echo to reassess LV function  2. Metastatic Renal Cell CA with lung nodules   --s/p nephrectomy. Currently stable. Recently treated with biological therapy. 3. HTN:  -Blood pressure well controlled with Entresto. 4. Snoring - Recent sleep study negative.   DGlori Bickers MD  12:18 PM

## 2017-05-19 NOTE — Patient Instructions (Addendum)
Increase Carvedilol 6.25 mg Twice daily   Your physician has requested that you have an echocardiogram. Echocardiography is a painless test that uses sound waves to create images of your heart. It provides your doctor with information about the size and shape of your heart and how well your heart's chambers and valves are working. This procedure takes approximately one hour. There are no restrictions for this procedure.  Your physician recommends that you schedule a follow-up appointment in: 3 months

## 2017-05-22 ENCOUNTER — Ambulatory Visit (HOSPITAL_COMMUNITY)
Admission: RE | Admit: 2017-05-22 | Discharge: 2017-05-22 | Disposition: A | Discharge status: Home / Self Care | Payer: BLUE CROSS/BLUE SHIELD | Source: Ambulatory Visit | Attending: Internal Medicine | Admitting: Internal Medicine

## 2017-05-22 DIAGNOSIS — M6281 Muscle weakness (generalized): Secondary | ICD-10-CM | POA: Diagnosis not present

## 2017-05-22 DIAGNOSIS — I110 Hypertensive heart disease with heart failure: Secondary | ICD-10-CM | POA: Insufficient documentation

## 2017-05-22 DIAGNOSIS — I5022 Chronic systolic (congestive) heart failure: Secondary | ICD-10-CM | POA: Insufficient documentation

## 2017-05-22 DIAGNOSIS — M25561 Pain in right knee: Secondary | ICD-10-CM | POA: Diagnosis not present

## 2017-05-22 DIAGNOSIS — E119 Type 2 diabetes mellitus without complications: Secondary | ICD-10-CM | POA: Insufficient documentation

## 2017-05-22 DIAGNOSIS — I428 Other cardiomyopathies: Secondary | ICD-10-CM | POA: Diagnosis not present

## 2017-05-22 DIAGNOSIS — I340 Nonrheumatic mitral (valve) insufficiency: Secondary | ICD-10-CM | POA: Insufficient documentation

## 2017-05-22 DIAGNOSIS — M25661 Stiffness of right knee, not elsewhere classified: Secondary | ICD-10-CM | POA: Diagnosis not present

## 2017-05-22 DIAGNOSIS — M25562 Pain in left knee: Secondary | ICD-10-CM | POA: Diagnosis not present

## 2017-05-23 ENCOUNTER — Telehealth (HOSPITAL_COMMUNITY): Payer: Self-pay

## 2017-05-23 ENCOUNTER — Ambulatory Visit: Payer: BLUE CROSS/BLUE SHIELD

## 2017-05-23 ENCOUNTER — Other Ambulatory Visit: Payer: BLUE CROSS/BLUE SHIELD

## 2017-05-23 ENCOUNTER — Ambulatory Visit: Payer: BLUE CROSS/BLUE SHIELD | Admitting: Oncology

## 2017-05-23 DIAGNOSIS — M25561 Pain in right knee: Secondary | ICD-10-CM | POA: Diagnosis not present

## 2017-05-23 DIAGNOSIS — M25562 Pain in left knee: Secondary | ICD-10-CM | POA: Diagnosis not present

## 2017-05-23 NOTE — Telephone Encounter (Signed)
Result Notes for ECHOCARDIOGRAM COMPLETE   Notes recorded by Effie Berkshire, RN on 05/23/2017 at 8:37 AM EST LVMTCB to discuss results. ------  Notes recorded by Jolaine Artist, MD on 05/22/2017 at 9:03 PM EST EF improved. Please let him know.

## 2017-05-23 NOTE — Telephone Encounter (Signed)
Result Notes for ECHOCARDIOGRAM COMPLETE   Notes recorded by Effie Berkshire, RN on 05/23/2017 at 11:28 AM EST Patient aware and very appreciative of care and asked to forward his regards and thanks to Dr. Haroldine Laws ------  Notes recorded by Effie Berkshire, RN on 05/23/2017 at 8:37 AM EST LVMTCB to discuss results. ------  Notes recorded by Jolaine Artist, MD on 05/22/2017 at 9:03 PM EST EF improved. Please let him know.

## 2017-05-28 ENCOUNTER — Encounter: Payer: Self-pay | Admitting: Oncology

## 2017-05-29 ENCOUNTER — Encounter: Payer: Self-pay | Admitting: *Deleted

## 2017-05-29 ENCOUNTER — Telehealth: Payer: Self-pay | Admitting: *Deleted

## 2017-05-29 NOTE — Telephone Encounter (Signed)
Spoke with patient. Let him know dr Alen Blew is awae of appts at [facility], thru care everywhere. Patient to call us in the future, if he needs anything.

## 2017-05-30 DIAGNOSIS — E109 Type 1 diabetes mellitus without complications: Secondary | ICD-10-CM | POA: Diagnosis not present

## 2017-05-30 DIAGNOSIS — E039 Hypothyroidism, unspecified: Secondary | ICD-10-CM | POA: Diagnosis not present

## 2017-05-30 DIAGNOSIS — Z794 Long term (current) use of insulin: Secondary | ICD-10-CM | POA: Diagnosis not present

## 2017-06-02 DIAGNOSIS — M25661 Stiffness of right knee, not elsewhere classified: Secondary | ICD-10-CM | POA: Diagnosis not present

## 2017-06-02 DIAGNOSIS — M25562 Pain in left knee: Secondary | ICD-10-CM | POA: Diagnosis not present

## 2017-06-02 DIAGNOSIS — M25561 Pain in right knee: Secondary | ICD-10-CM | POA: Diagnosis not present

## 2017-06-02 DIAGNOSIS — M6281 Muscle weakness (generalized): Secondary | ICD-10-CM | POA: Diagnosis not present

## 2017-06-04 DIAGNOSIS — E785 Hyperlipidemia, unspecified: Secondary | ICD-10-CM | POA: Diagnosis not present

## 2017-06-04 DIAGNOSIS — Z85528 Personal history of other malignant neoplasm of kidney: Secondary | ICD-10-CM | POA: Diagnosis not present

## 2017-06-04 DIAGNOSIS — Z794 Long term (current) use of insulin: Secondary | ICD-10-CM | POA: Diagnosis not present

## 2017-06-04 DIAGNOSIS — Z79890 Hormone replacement therapy (postmenopausal): Secondary | ICD-10-CM | POA: Diagnosis not present

## 2017-06-04 DIAGNOSIS — Z905 Acquired absence of kidney: Secondary | ICD-10-CM | POA: Diagnosis not present

## 2017-06-04 DIAGNOSIS — M2550 Pain in unspecified joint: Secondary | ICD-10-CM | POA: Diagnosis not present

## 2017-06-04 DIAGNOSIS — Z79899 Other long term (current) drug therapy: Secondary | ICD-10-CM | POA: Diagnosis not present

## 2017-06-04 DIAGNOSIS — C641 Malignant neoplasm of right kidney, except renal pelvis: Secondary | ICD-10-CM | POA: Diagnosis not present

## 2017-06-04 DIAGNOSIS — I10 Essential (primary) hypertension: Secondary | ICD-10-CM | POA: Diagnosis not present

## 2017-06-04 DIAGNOSIS — Z7952 Long term (current) use of systemic steroids: Secondary | ICD-10-CM | POA: Diagnosis not present

## 2017-06-04 DIAGNOSIS — C649 Malignant neoplasm of unspecified kidney, except renal pelvis: Secondary | ICD-10-CM | POA: Diagnosis not present

## 2017-06-04 DIAGNOSIS — D509 Iron deficiency anemia, unspecified: Secondary | ICD-10-CM | POA: Diagnosis not present

## 2017-06-04 DIAGNOSIS — C7802 Secondary malignant neoplasm of left lung: Secondary | ICD-10-CM | POA: Diagnosis not present

## 2017-06-04 DIAGNOSIS — E119 Type 2 diabetes mellitus without complications: Secondary | ICD-10-CM | POA: Diagnosis not present

## 2017-06-04 DIAGNOSIS — E1065 Type 1 diabetes mellitus with hyperglycemia: Secondary | ICD-10-CM | POA: Diagnosis not present

## 2017-06-05 DIAGNOSIS — M2550 Pain in unspecified joint: Secondary | ICD-10-CM | POA: Diagnosis not present

## 2017-06-05 DIAGNOSIS — C641 Malignant neoplasm of right kidney, except renal pelvis: Secondary | ICD-10-CM | POA: Diagnosis not present

## 2017-06-05 DIAGNOSIS — D630 Anemia in neoplastic disease: Secondary | ICD-10-CM | POA: Diagnosis not present

## 2017-06-05 DIAGNOSIS — Z794 Long term (current) use of insulin: Secondary | ICD-10-CM | POA: Diagnosis not present

## 2017-06-05 DIAGNOSIS — M1710 Unilateral primary osteoarthritis, unspecified knee: Secondary | ICD-10-CM | POA: Diagnosis not present

## 2017-06-09 DIAGNOSIS — M25562 Pain in left knee: Secondary | ICD-10-CM | POA: Diagnosis not present

## 2017-06-09 DIAGNOSIS — M6281 Muscle weakness (generalized): Secondary | ICD-10-CM | POA: Diagnosis not present

## 2017-06-09 DIAGNOSIS — M25661 Stiffness of right knee, not elsewhere classified: Secondary | ICD-10-CM | POA: Diagnosis not present

## 2017-06-09 DIAGNOSIS — M25561 Pain in right knee: Secondary | ICD-10-CM | POA: Diagnosis not present

## 2017-06-13 DIAGNOSIS — M6281 Muscle weakness (generalized): Secondary | ICD-10-CM | POA: Diagnosis not present

## 2017-06-13 DIAGNOSIS — M25562 Pain in left knee: Secondary | ICD-10-CM | POA: Diagnosis not present

## 2017-06-13 DIAGNOSIS — M25561 Pain in right knee: Secondary | ICD-10-CM | POA: Diagnosis not present

## 2017-06-13 DIAGNOSIS — M25661 Stiffness of right knee, not elsewhere classified: Secondary | ICD-10-CM | POA: Diagnosis not present

## 2017-06-16 ENCOUNTER — Ambulatory Visit: Payer: BLUE CROSS/BLUE SHIELD | Admitting: Internal Medicine

## 2017-06-16 DIAGNOSIS — Z0289 Encounter for other administrative examinations: Secondary | ICD-10-CM

## 2017-06-17 ENCOUNTER — Other Ambulatory Visit: Payer: Self-pay | Admitting: Oncology

## 2017-07-03 ENCOUNTER — Encounter: Payer: Self-pay | Admitting: Oncology

## 2017-07-04 ENCOUNTER — Inpatient Hospital Stay: Payer: BLUE CROSS/BLUE SHIELD | Attending: Oncology

## 2017-07-04 ENCOUNTER — Telehealth: Payer: Self-pay | Admitting: Oncology

## 2017-07-04 DIAGNOSIS — C641 Malignant neoplasm of right kidney, except renal pelvis: Secondary | ICD-10-CM | POA: Insufficient documentation

## 2017-07-04 DIAGNOSIS — C649 Malignant neoplasm of unspecified kidney, except renal pelvis: Secondary | ICD-10-CM

## 2017-07-04 DIAGNOSIS — M1710 Unilateral primary osteoarthritis, unspecified knee: Secondary | ICD-10-CM | POA: Diagnosis not present

## 2017-07-04 DIAGNOSIS — M2550 Pain in unspecified joint: Secondary | ICD-10-CM | POA: Diagnosis not present

## 2017-07-04 DIAGNOSIS — E1065 Type 1 diabetes mellitus with hyperglycemia: Secondary | ICD-10-CM | POA: Diagnosis not present

## 2017-07-04 LAB — COMPREHENSIVE METABOLIC PANEL
ALK PHOS: 90 U/L (ref 40–150)
ALT: <6 U/L (ref 0–55)
ANION GAP: 11 (ref 3–11)
AST: 9 U/L (ref 5–34)
Albumin: 3.2 g/dL — ABNORMAL LOW (ref 3.5–5.0)
BILIRUBIN TOTAL: 0.3 mg/dL (ref 0.2–1.2)
BUN: 36 mg/dL — ABNORMAL HIGH (ref 7–26)
CALCIUM: 9.6 mg/dL (ref 8.4–10.4)
CO2: 24 mmol/L (ref 22–29)
Chloride: 99 mmol/L (ref 98–109)
Creatinine, Ser: 0.99 mg/dL (ref 0.70–1.30)
GFR calc non Af Amer: >60 mL/min (ref >60)
Glucose, Bld: 344 mg/dL — ABNORMAL HIGH (ref 70–140)
Potassium: 3.7 mmol/L (ref 3.5–5.1)
SODIUM: 134 mmol/L — AB (ref 136–145)
Total Protein: 7 g/dL (ref 6.4–8.3)

## 2017-07-04 LAB — CBC WITH DIFFERENTIAL/PLATELET
BASOS ABS: 0 10*3/uL (ref 0.0–0.1)
BASOS PCT: 0 %
Eosinophils Absolute: 0.1 10*3/uL (ref 0.0–0.5)
Eosinophils Relative: 1 %
HCT: 36.3 % — ABNORMAL LOW (ref 38.4–49.9)
HEMOGLOBIN: 11.1 g/dL — AB (ref 13.0–17.1)
Lymphocytes Relative: 13 %
Lymphs Abs: 1.2 10*3/uL (ref 0.9–3.3)
MCH: 26.2 pg — ABNORMAL LOW (ref 27.2–33.4)
MCHC: 30.6 g/dL — AB (ref 32.0–36.0)
MCV: 85.6 fL (ref 79.3–98.0)
Monocytes Absolute: 0.8 10*3/uL (ref 0.1–0.9)
Monocytes Relative: 8 %
NEUTROS ABS: 7.1 10*3/uL — AB (ref 1.5–6.5)
NEUTROS PCT: 78 %
NRBC: 0 /100{WBCs}
Platelets: 240 10*3/uL (ref 140–400)
RBC: 4.24 MIL/uL (ref 4.20–5.82)
RDW: 20.7 % — ABNORMAL HIGH (ref 11.0–14.6)
WBC: 9.2 10*3/uL (ref 4.0–10.3)

## 2017-07-04 NOTE — Telephone Encounter (Signed)
Patient presented with note from [facility] stating OK to have labs drawn here for [facility] [employer] [facility] [place].  Add on Lab appt made.

## 2017-07-16 DIAGNOSIS — R531 Weakness: Secondary | ICD-10-CM | POA: Diagnosis not present

## 2017-07-16 DIAGNOSIS — E10649 Type 1 diabetes mellitus with hypoglycemia without coma: Secondary | ICD-10-CM | POA: Diagnosis not present

## 2017-07-16 DIAGNOSIS — Z794 Long term (current) use of insulin: Secondary | ICD-10-CM | POA: Diagnosis not present

## 2017-07-16 DIAGNOSIS — Z85528 Personal history of other malignant neoplasm of kidney: Secondary | ICD-10-CM | POA: Diagnosis not present

## 2017-07-22 DIAGNOSIS — E039 Hypothyroidism, unspecified: Secondary | ICD-10-CM | POA: Diagnosis not present

## 2017-07-22 DIAGNOSIS — Z794 Long term (current) use of insulin: Secondary | ICD-10-CM | POA: Diagnosis not present

## 2017-07-22 DIAGNOSIS — E109 Type 1 diabetes mellitus without complications: Secondary | ICD-10-CM | POA: Diagnosis not present

## 2017-08-08 ENCOUNTER — Ambulatory Visit (INDEPENDENT_AMBULATORY_CARE_PROVIDER_SITE_OTHER): Payer: BLUE CROSS/BLUE SHIELD | Admitting: Ophthalmology

## 2017-08-08 DIAGNOSIS — H2512 Age-related nuclear cataract, left eye: Secondary | ICD-10-CM | POA: Diagnosis not present

## 2017-08-08 DIAGNOSIS — I10 Essential (primary) hypertension: Secondary | ICD-10-CM | POA: Diagnosis not present

## 2017-08-08 DIAGNOSIS — H353121 Nonexudative age-related macular degeneration, left eye, early dry stage: Secondary | ICD-10-CM

## 2017-08-08 DIAGNOSIS — H43813 Vitreous degeneration, bilateral: Secondary | ICD-10-CM

## 2017-08-08 DIAGNOSIS — H35033 Hypertensive retinopathy, bilateral: Secondary | ICD-10-CM | POA: Diagnosis not present

## 2017-08-08 DIAGNOSIS — H353211 Exudative age-related macular degeneration, right eye, with active choroidal neovascularization: Secondary | ICD-10-CM | POA: Diagnosis not present

## 2017-08-14 LAB — HM DIABETES EYE EXAM

## 2017-08-18 ENCOUNTER — Encounter (HOSPITAL_COMMUNITY): Payer: Self-pay | Admitting: Internal Medicine

## 2017-08-18 ENCOUNTER — Ambulatory Visit (HOSPITAL_COMMUNITY)
Admission: RE | Admit: 2017-08-18 | Discharge: 2017-08-18 | Disposition: A | Discharge status: Home / Self Care | Payer: BLUE CROSS/BLUE SHIELD | Source: Ambulatory Visit | Attending: Internal Medicine | Admitting: Internal Medicine

## 2017-08-18 VITALS — BP 134/80 | HR 72 | Wt 245.8 lb

## 2017-08-18 DIAGNOSIS — Z7952 Long term (current) use of systemic steroids: Secondary | ICD-10-CM | POA: Diagnosis not present

## 2017-08-18 DIAGNOSIS — I10 Essential (primary) hypertension: Secondary | ICD-10-CM

## 2017-08-18 DIAGNOSIS — R0683 Snoring: Secondary | ICD-10-CM | POA: Diagnosis not present

## 2017-08-18 DIAGNOSIS — I5022 Chronic systolic (congestive) heart failure: Secondary | ICD-10-CM | POA: Diagnosis not present

## 2017-08-18 DIAGNOSIS — Z794 Long term (current) use of insulin: Secondary | ICD-10-CM | POA: Insufficient documentation

## 2017-08-18 DIAGNOSIS — I428 Other cardiomyopathies: Secondary | ICD-10-CM | POA: Insufficient documentation

## 2017-08-18 DIAGNOSIS — I420 Dilated cardiomyopathy: Secondary | ICD-10-CM | POA: Insufficient documentation

## 2017-08-18 DIAGNOSIS — E781 Pure hyperglyceridemia: Secondary | ICD-10-CM | POA: Insufficient documentation

## 2017-08-18 DIAGNOSIS — R918 Other nonspecific abnormal finding of lung field: Secondary | ICD-10-CM | POA: Insufficient documentation

## 2017-08-18 DIAGNOSIS — Z79890 Hormone replacement therapy (postmenopausal): Secondary | ICD-10-CM | POA: Insufficient documentation

## 2017-08-18 DIAGNOSIS — Z85528 Personal history of other malignant neoplasm of kidney: Secondary | ICD-10-CM | POA: Diagnosis not present

## 2017-08-18 DIAGNOSIS — E119 Type 2 diabetes mellitus without complications: Secondary | ICD-10-CM | POA: Diagnosis not present

## 2017-08-18 DIAGNOSIS — Z905 Acquired absence of kidney: Secondary | ICD-10-CM | POA: Insufficient documentation

## 2017-08-18 DIAGNOSIS — Z79899 Other long term (current) drug therapy: Secondary | ICD-10-CM | POA: Diagnosis not present

## 2017-08-18 DIAGNOSIS — I2510 Atherosclerotic heart disease of native coronary artery without angina pectoris: Secondary | ICD-10-CM | POA: Diagnosis not present

## 2017-08-18 DIAGNOSIS — F419 Anxiety disorder, unspecified: Secondary | ICD-10-CM | POA: Diagnosis not present

## 2017-08-18 MED ORDER — SACUBITRIL-VALSARTAN 49-51 MG PO TABS: 1.0000 | ORAL_TABLET | Freq: Two times a day (BID) | ORAL | 3 refills | Status: DC

## 2017-08-18 NOTE — Patient Instructions (Signed)
Follow up in 6 Months, please call us at [phone] Option 3 to schedule your appointment.

## 2017-08-18 NOTE — Progress Notes (Signed)
 ADVANCED HF CLINIC NOTE  Referring Physician:  Melissa Lowe MD  Primary Cardiologist: None  HPI:  Gregory [name] is a 56 y/o male with h/o obesity, metastatic R renal cell CA s/p R nephrectomy in 9/17 with the subsequent discovery of 2 pulmonary nodules whom we follow for non-ischemic cardiomyopathy.  Developed microscopic hematuria and found to have large right renal mass c/w renal cell CA. Eventually underwent right radical nephrectomy with adrenal sparing on 02/07/16 at [facility]. Post-op developed bradycardia with HRs in 30s while sleeping. BP stable. When awakened had dizziness so given atropine. ECG revealed sinus brady in 40s with mild QT prolongation which resolved. Echo done and EF 45% with global HK. RV dilated  With normal function .  We saw him for the first time in October 2017. At that time we ordered cMRI and stress test. However in the interim he went back to [facility]. Found to have 2 lung nodules and wanted to start chemo. Echo showed EF at 46%. However MUGA with EF 28% so referred back to us for further evaluation prior to chemo. Underwent cath 04/09/16 as below.   1. 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 Otherwise normal coronaries 3. Nonischemic, dilated cardiomyopathy with EF 30-35% and global hypokinesis  cMRI 05/29/16 EF 28% No LGE  Had sleep study recently was negative.   He is here today for regular follow up. At last visit, his coreg was increased to 6.25 mg BID. He is tolerating fine. Able to ambulate without difficulty. No CP. Finished immunotherapy November 2018.  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 Started on prednisone and now on Actemra. He follows with Dr. Kerr in town and has an endocrinologist at [facility] as well. Weight increased 35 lbs since last visit, but now on prednisone and insulin. No CP, dizziness, SOB, edema, orthopnea, or PND. Going for  his quarterly check up at [facility] today and laparoscopic pulmonary nodule (2) removal on Thursday.   ECHO 09/12/16 35-40%, RV ok. - Reviewed personally  Echo 05/22/17: EF 50-55%, grade 1 DD.   FHx:   Father died at 82 from HF (unknown CAD status) Mother died 73 from brain cancer Sister fine    Past Medical History:  Diagnosis Date  . Allergy   . Anemia    low iron  . Anxiety   . Cancer (HCC)    Renal cell cancer  . Complex tear of lateral meniscus of right knee as current injury 03/20/2017  . Complex tear of medial meniscus of right knee 03/20/2017  . Diabetes mellitus without complication (HCC)   . History of gastrointestinal hemorrhage   . Hypertension   . Hypertriglyceridemia   . Macular degeneration    lasar surgery- Dr Matthews  . Neuropathy   . Nonischemic cardiomyopathy (HCC)    EF 25-30% by echo 09/12/16  . Plantar warts   . PONV (postoperative nausea and vomiting)    " a little bit of nausea"  . Primary localized osteoarthritis of right knee 03/20/2017  . Renal cell carcinoma (HCC)    s/p nephrectomy 02/07/16 at [facility])    Current Outpatient Medications  Medication Sig Dispense Refill  . blood glucose meter kit and supplies KIT Dispense based on patient and insurance preference. Use up to four times daily as directed. (FOR ICD-9 250.00, 250.01). 1 each 0  . carvedilol (COREG) 6.25 MG tablet   Take 1 tablet (6.25 mg total) by mouth 2 (two) times daily. 60 tablet 6  . cetirizine (ZYRTEC) 10 MG tablet Take 10 mg by mouth daily.    Marland Kitchen ENTRESTO 49-51 MG TAKE 1 TABLET TWICE DAILY. 60 tablet 3  . insulin aspart (NOVOLOG FLEXPEN) 100 UNIT/ML FlexPen Add to meal coverage Sliding scale CBG 70 - 120: 0 units CBG 121 - 150: 1 unit,  CBG 151 - 200: 2 units,  CBG 201 - 250: 3 units,  CBG 251 - 300: 5 units,  CBG 301 - 350: 7 units,  CBG 351 - 400: 9 units   CBG > 400: 9 units and notify your MD (Patient taking differently: Inject 7-18 Units into the skin 3 (three) times  daily with meals. Per sliding scale Add to meal coverage Sliding scale CBG 70 - 120: 0 units CBG 121 - 150: 1 unit,  CBG 151 - 200: 2 units,  CBG 201 - 250: 3 units,  CBG 251 - 300: 5 units,  CBG 301 - 350: 7 units,  CBG 351 - 400: 9 units   CBG > 400: 9 units and notify your MD) 15 mL 11  . Insulin Glargine (LANTUS) 100 UNIT/ML Solostar Pen Inject 20 Units into the skin at bedtime. (Patient taking differently: Inject 34 Units into the skin at bedtime. ) 15 mL 3  . Insulin Pen Needle 32G X 5 MM MISC 1 each by Does not apply route at bedtime. For Solostar Pen 100 each 4  . Insulin Syringe-Needle U-100 (INSULIN SYRINGE 1CC/31GX5/16") 31G X 5/16" 1 ML MISC 1 each by Does not apply route 3 (three) times daily before meals. 100 each 6  . Iron-Folic [id number] (FERRAPLUS 90) 90-1 MG TABS Take 1 tablet by mouth daily. 30 tablet 5  . levothyroxine (SYNTHROID, LEVOTHROID) 25 MCG tablet TAKE 1 TABLET ONCE DAILY BEFORE BREAKFAST 30 tablet 0  . PARoxetine (PAXIL-CR) 25 MG 24 hr tablet Take 1 tablet (25 mg total) by mouth daily. 90 tablet 3  . predniSONE (DELTASONE) 5 MG tablet Take 15 mg by mouth daily with breakfast.    . sennosides-docusate sodium (SENOKOT-S) 8.6-50 MG tablet Take 2 tablets by mouth daily. 30 tablet 1  . thiamine (VITAMIN B-1) 100 MG tablet Take 1 tablet (100 mg total) by mouth daily. 90 tablet 1   No current facility-administered medications for this encounter.     No Known Allergies    Social History   Socioeconomic History  . Marital status: Married    Spouse name: Not on file  . Number of children: Not on file  . Years of education: Not on file  . Highest education level: Not on file  Occupational History  . Not on file  Social Needs  . Financial resource strain: Not on file  . Food insecurity:    Worry: Not on file    Inability: Not on file  . Transportation needs:    Medical: Not on file    Non-medical: Not on file  Tobacco Use  . Smoking status: Never  Smoker  . Smokeless tobacco: Never Used  Substance and Sexual Activity  . Alcohol use: Yes    Alcohol/week: 0.0 oz    Comment: social  . Drug use: No  . Sexual activity: Not on file  Lifestyle  . Physical activity:    Days per week: Not on file    Minutes per session: Not on file  . Stress: Not on file  Relationships  .  Social connections:    Talks on phone: Not on file    Gets together: Not on file    Attends religious service: Not on file    Active member of club or organization: Not on file    Attends meetings of clubs or organizations: Not on file    Relationship status: Not on file  . Intimate partner violence:    Fear of current or ex partner: Not on file    Emotionally abused: Not on file    Physically abused: Not on file    Forced sexual activity: Not on file  Other Topics Concern  . 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       Family History  Problem Relation Age of Onset  . Hypertension Mother   . Bipolar disorder Mother   . Cancer Mother 73       Brain  . Other Father        brain tumor  . Colon cancer Neg Hx   . Colon polyps Neg Hx   . Rectal cancer Neg Hx   . Stomach cancer Neg Hx   . Early death Neg Hx   . Hyperlipidemia Neg Hx   . Kidney disease Neg Hx     Vitals:   08/18/17 0923  BP: 134/80  Pulse: 72  SpO2: 97%  Weight: 245 lb 12.8 oz (111.5 kg)   Wt Readings from Last 3 Encounters:  08/18/17 245 lb 12.8 oz (111.5 kg)  05/19/17 209 lb 12 oz (95.1 kg)  05/02/17 205 lb 12.8 oz (93.4 kg)    PHYSICAL EXAM: General:  Well appearing. No resp difficulty HEENT: normal anicteric Neck: supple. JVP not elevated. Carotids 2+ bilat; no bruits. No lymphadenopathy or thryomegaly appreciated. Cor: PMI nondisplaced. Regular rate & rhythm. No rubs, gallops or murmurs. Lungs:  Clear throughout no wheeze  Abdomen: obese, soft, nontender, nondistended. No hepatosplenomegaly. No bruits or masses. Good bowel sounds. Extremities: no cyanosis, clubbing, rash, edema Neuro: alert & orientedx3, cranial nerves grossly intact. moves all 4 extremities w/o difficulty. Affect pleasant   ECG: Sinus brady 42 QRS 96 QTC 458ms (02/07/16)              NSR 76 QRS 80 QTc 459 No ST-T wave abnormalities. (05/19/17)   ASSESSMENT & PLAN: 1. NICM:   - Unclear etiology. EF 30-35% by cath with NICM. cMRI 1/18 with EF 28%. Etiology remains unclear. No evidence of scar of infiltrative process on MRI. Echo 4/18 EF 35-40%. Echo 04/2017 EF 50-55% -  Stable NYHA I-II, volume status stable. -  Continue carvedilol to 6.25 bid -  Continue Entresto 49/51 mg BID.  -  Labs at [facility] reviewed through Care Everywhere and renal function stable.  2. Metastatic Renal Cell CA with lung nodules - s/p nephrectomy. Currently stable. Recently treated with biological therapy. 3. HTN:  -Blood pressure well controlled with Entresto. 4. Snoring - Recent sleep study negative.   He will get labwork at [facility].   Ashley M Smith, NP 9:24 AM  Patient seen and examined with the above-signed Advanced Practice Provider and/or Housestaff. I personally reviewed laboratory data, imaging studies and relevant notes. I independently examined the patient and formulated the important aspects of the plan. I have edited the note   to reflect any of my changes or salient points. I have personally discussed the plan with the patient and/or family.  He is doing well. NYHA I-II. Volume status looks good despite weight gain. Recent echo reviewed with him and EF 50-55%. Will continue current regimen. Counseled on need to watch for volume overload on steroids. Blood pressure well controlled.     Glori Bickers, MD  10:27 PM

## 2017-08-19 DIAGNOSIS — M419 Scoliosis, unspecified: Secondary | ICD-10-CM | POA: Diagnosis not present

## 2017-08-19 DIAGNOSIS — M1990 Unspecified osteoarthritis, unspecified site: Secondary | ICD-10-CM | POA: Diagnosis not present

## 2017-08-19 DIAGNOSIS — C7802 Secondary malignant neoplasm of left lung: Secondary | ICD-10-CM | POA: Diagnosis not present

## 2017-08-19 DIAGNOSIS — M069 Rheumatoid arthritis, unspecified: Secondary | ICD-10-CM | POA: Diagnosis not present

## 2017-08-19 DIAGNOSIS — E785 Hyperlipidemia, unspecified: Secondary | ICD-10-CM | POA: Diagnosis not present

## 2017-08-19 DIAGNOSIS — E039 Hypothyroidism, unspecified: Secondary | ICD-10-CM | POA: Insufficient documentation

## 2017-08-19 DIAGNOSIS — Z01818 Encounter for other preprocedural examination: Secondary | ICD-10-CM | POA: Diagnosis not present

## 2017-08-19 DIAGNOSIS — E1065 Type 1 diabetes mellitus with hyperglycemia: Secondary | ICD-10-CM | POA: Diagnosis not present

## 2017-08-19 DIAGNOSIS — Z7952 Long term (current) use of systemic steroids: Secondary | ICD-10-CM | POA: Diagnosis not present

## 2017-08-19 DIAGNOSIS — Z79890 Hormone replacement therapy (postmenopausal): Secondary | ICD-10-CM | POA: Diagnosis not present

## 2017-08-19 DIAGNOSIS — Z905 Acquired absence of kidney: Secondary | ICD-10-CM | POA: Diagnosis not present

## 2017-08-19 DIAGNOSIS — I10 Essential (primary) hypertension: Secondary | ICD-10-CM | POA: Diagnosis not present

## 2017-08-19 DIAGNOSIS — Z79899 Other long term (current) drug therapy: Secondary | ICD-10-CM | POA: Diagnosis not present

## 2017-08-19 DIAGNOSIS — Z01812 Encounter for preprocedural laboratory examination: Secondary | ICD-10-CM | POA: Diagnosis not present

## 2017-08-19 DIAGNOSIS — M2550 Pain in unspecified joint: Secondary | ICD-10-CM | POA: Diagnosis not present

## 2017-08-19 DIAGNOSIS — M1710 Unilateral primary osteoarthritis, unspecified knee: Secondary | ICD-10-CM | POA: Diagnosis not present

## 2017-08-19 DIAGNOSIS — D509 Iron deficiency anemia, unspecified: Secondary | ICD-10-CM | POA: Diagnosis not present

## 2017-08-19 DIAGNOSIS — T380X5A Adverse effect of glucocorticoids and synthetic analogues, initial encounter: Secondary | ICD-10-CM | POA: Diagnosis not present

## 2017-08-19 DIAGNOSIS — Z794 Long term (current) use of insulin: Secondary | ICD-10-CM | POA: Diagnosis not present

## 2017-08-19 DIAGNOSIS — C641 Malignant neoplasm of right kidney, except renal pelvis: Secondary | ICD-10-CM | POA: Diagnosis not present

## 2017-08-20 DIAGNOSIS — E538 Deficiency of other specified B group vitamins: Secondary | ICD-10-CM | POA: Diagnosis not present

## 2017-08-20 DIAGNOSIS — R918 Other nonspecific abnormal finding of lung field: Secondary | ICD-10-CM | POA: Diagnosis not present

## 2017-08-20 DIAGNOSIS — D509 Iron deficiency anemia, unspecified: Secondary | ICD-10-CM | POA: Diagnosis not present

## 2017-08-20 DIAGNOSIS — E519 Thiamine deficiency, unspecified: Secondary | ICD-10-CM | POA: Diagnosis not present

## 2017-08-20 DIAGNOSIS — C7802 Secondary malignant neoplasm of left lung: Secondary | ICD-10-CM | POA: Diagnosis not present

## 2017-08-20 DIAGNOSIS — M1990 Unspecified osteoarthritis, unspecified site: Secondary | ICD-10-CM | POA: Diagnosis not present

## 2017-08-20 DIAGNOSIS — C641 Malignant neoplasm of right kidney, except renal pelvis: Secondary | ICD-10-CM | POA: Diagnosis not present

## 2017-08-20 DIAGNOSIS — E109 Type 1 diabetes mellitus without complications: Secondary | ICD-10-CM | POA: Diagnosis not present

## 2017-08-20 DIAGNOSIS — R911 Solitary pulmonary nodule: Secondary | ICD-10-CM | POA: Diagnosis not present

## 2017-08-21 DIAGNOSIS — Z96651 Presence of right artificial knee joint: Secondary | ICD-10-CM | POA: Diagnosis not present

## 2017-08-21 DIAGNOSIS — E108 Type 1 diabetes mellitus with unspecified complications: Secondary | ICD-10-CM | POA: Diagnosis not present

## 2017-08-21 DIAGNOSIS — E785 Hyperlipidemia, unspecified: Secondary | ICD-10-CM | POA: Diagnosis not present

## 2017-08-21 DIAGNOSIS — Z4682 Encounter for fitting and adjustment of non-vascular catheter: Secondary | ICD-10-CM | POA: Diagnosis not present

## 2017-08-21 DIAGNOSIS — I10 Essential (primary) hypertension: Secondary | ICD-10-CM | POA: Diagnosis not present

## 2017-08-21 DIAGNOSIS — E1065 Type 1 diabetes mellitus with hyperglycemia: Secondary | ICD-10-CM | POA: Diagnosis not present

## 2017-08-21 DIAGNOSIS — E039 Hypothyroidism, unspecified: Secondary | ICD-10-CM | POA: Diagnosis not present

## 2017-08-21 DIAGNOSIS — T451X5A Adverse effect of antineoplastic and immunosuppressive drugs, initial encounter: Secondary | ICD-10-CM | POA: Diagnosis not present

## 2017-08-21 DIAGNOSIS — Z7952 Long term (current) use of systemic steroids: Secondary | ICD-10-CM | POA: Diagnosis not present

## 2017-08-21 DIAGNOSIS — M1993 Secondary osteoarthritis, unspecified site: Secondary | ICD-10-CM | POA: Diagnosis not present

## 2017-08-21 DIAGNOSIS — E538 Deficiency of other specified B group vitamins: Secondary | ICD-10-CM | POA: Diagnosis not present

## 2017-08-21 DIAGNOSIS — F419 Anxiety disorder, unspecified: Secondary | ICD-10-CM | POA: Diagnosis not present

## 2017-08-21 DIAGNOSIS — D649 Anemia, unspecified: Secondary | ICD-10-CM | POA: Diagnosis not present

## 2017-08-21 DIAGNOSIS — C7800 Secondary malignant neoplasm of unspecified lung: Secondary | ICD-10-CM | POA: Diagnosis not present

## 2017-08-21 DIAGNOSIS — M069 Rheumatoid arthritis, unspecified: Secondary | ICD-10-CM | POA: Diagnosis not present

## 2017-08-21 DIAGNOSIS — J9811 Atelectasis: Secondary | ICD-10-CM | POA: Diagnosis not present

## 2017-08-21 DIAGNOSIS — T380X5A Adverse effect of glucocorticoids and synthetic analogues, initial encounter: Secondary | ICD-10-CM | POA: Diagnosis not present

## 2017-08-21 DIAGNOSIS — E109 Type 1 diabetes mellitus without complications: Secondary | ICD-10-CM | POA: Diagnosis not present

## 2017-08-21 DIAGNOSIS — J95811 Postprocedural pneumothorax: Secondary | ICD-10-CM | POA: Diagnosis not present

## 2017-08-21 DIAGNOSIS — Z96612 Presence of left artificial shoulder joint: Secondary | ICD-10-CM | POA: Diagnosis not present

## 2017-08-21 DIAGNOSIS — R918 Other nonspecific abnormal finding of lung field: Secondary | ICD-10-CM | POA: Diagnosis not present

## 2017-08-21 DIAGNOSIS — C641 Malignant neoplasm of right kidney, except renal pelvis: Secondary | ICD-10-CM | POA: Diagnosis not present

## 2017-08-21 DIAGNOSIS — Z794 Long term (current) use of insulin: Secondary | ICD-10-CM | POA: Diagnosis not present

## 2017-08-21 DIAGNOSIS — E1165 Type 2 diabetes mellitus with hyperglycemia: Secondary | ICD-10-CM | POA: Diagnosis not present

## 2017-08-21 DIAGNOSIS — J438 Other emphysema: Secondary | ICD-10-CM | POA: Diagnosis not present

## 2017-08-21 DIAGNOSIS — I429 Cardiomyopathy, unspecified: Secondary | ICD-10-CM | POA: Diagnosis not present

## 2017-08-21 DIAGNOSIS — C649 Malignant neoplasm of unspecified kidney, except renal pelvis: Secondary | ICD-10-CM | POA: Diagnosis not present

## 2017-08-21 DIAGNOSIS — E10649 Type 1 diabetes mellitus with hypoglycemia without coma: Secondary | ICD-10-CM | POA: Diagnosis not present

## 2017-08-21 DIAGNOSIS — T380X5D Adverse effect of glucocorticoids and synthetic analogues, subsequent encounter: Secondary | ICD-10-CM | POA: Diagnosis not present

## 2017-08-21 DIAGNOSIS — C7802 Secondary malignant neoplasm of left lung: Secondary | ICD-10-CM | POA: Diagnosis not present

## 2017-08-22 DIAGNOSIS — E1165 Type 2 diabetes mellitus with hyperglycemia: Secondary | ICD-10-CM | POA: Diagnosis not present

## 2017-08-22 DIAGNOSIS — J9811 Atelectasis: Secondary | ICD-10-CM | POA: Diagnosis not present

## 2017-08-22 DIAGNOSIS — Z7952 Long term (current) use of systemic steroids: Secondary | ICD-10-CM | POA: Diagnosis not present

## 2017-08-22 DIAGNOSIS — Z794 Long term (current) use of insulin: Secondary | ICD-10-CM | POA: Diagnosis not present

## 2017-08-22 DIAGNOSIS — J438 Other emphysema: Secondary | ICD-10-CM | POA: Diagnosis not present

## 2017-08-22 DIAGNOSIS — M069 Rheumatoid arthritis, unspecified: Secondary | ICD-10-CM | POA: Diagnosis not present

## 2017-08-22 DIAGNOSIS — T380X5D Adverse effect of glucocorticoids and synthetic analogues, subsequent encounter: Secondary | ICD-10-CM | POA: Diagnosis not present

## 2017-08-22 DIAGNOSIS — J95811 Postprocedural pneumothorax: Secondary | ICD-10-CM | POA: Diagnosis not present

## 2017-08-23 DIAGNOSIS — E1065 Type 1 diabetes mellitus with hyperglycemia: Secondary | ICD-10-CM | POA: Diagnosis not present

## 2017-08-23 DIAGNOSIS — Z794 Long term (current) use of insulin: Secondary | ICD-10-CM | POA: Diagnosis not present

## 2017-08-24 DIAGNOSIS — Z794 Long term (current) use of insulin: Secondary | ICD-10-CM | POA: Diagnosis not present

## 2017-08-24 DIAGNOSIS — E1065 Type 1 diabetes mellitus with hyperglycemia: Secondary | ICD-10-CM | POA: Diagnosis not present

## 2017-08-24 DIAGNOSIS — C7802 Secondary malignant neoplasm of left lung: Secondary | ICD-10-CM | POA: Diagnosis not present

## 2017-08-25 DIAGNOSIS — Z794 Long term (current) use of insulin: Secondary | ICD-10-CM | POA: Diagnosis not present

## 2017-08-25 DIAGNOSIS — C7802 Secondary malignant neoplasm of left lung: Secondary | ICD-10-CM | POA: Diagnosis not present

## 2017-08-25 DIAGNOSIS — E109 Type 1 diabetes mellitus without complications: Secondary | ICD-10-CM | POA: Diagnosis not present

## 2017-08-29 DIAGNOSIS — E109 Type 1 diabetes mellitus without complications: Secondary | ICD-10-CM | POA: Diagnosis not present

## 2017-08-29 DIAGNOSIS — E039 Hypothyroidism, unspecified: Secondary | ICD-10-CM | POA: Diagnosis not present

## 2017-08-29 DIAGNOSIS — Z794 Long term (current) use of insulin: Secondary | ICD-10-CM | POA: Diagnosis not present

## 2017-10-07 DIAGNOSIS — E109 Type 1 diabetes mellitus without complications: Secondary | ICD-10-CM | POA: Diagnosis not present

## 2017-10-07 DIAGNOSIS — Z794 Long term (current) use of insulin: Secondary | ICD-10-CM | POA: Diagnosis not present

## 2017-10-07 DIAGNOSIS — E039 Hypothyroidism, unspecified: Secondary | ICD-10-CM | POA: Diagnosis not present

## 2017-10-07 LAB — HEMOGLOBIN A1C: Hemoglobin A1C: 9.8

## 2017-11-10 DIAGNOSIS — M1711 Unilateral primary osteoarthritis, right knee: Secondary | ICD-10-CM | POA: Diagnosis not present

## 2017-11-17 DIAGNOSIS — C7802 Secondary malignant neoplasm of left lung: Secondary | ICD-10-CM | POA: Diagnosis not present

## 2017-11-17 DIAGNOSIS — Z85528 Personal history of other malignant neoplasm of kidney: Secondary | ICD-10-CM | POA: Diagnosis not present

## 2017-11-17 DIAGNOSIS — Z902 Acquired absence of lung [part of]: Secondary | ICD-10-CM | POA: Diagnosis not present

## 2017-11-17 DIAGNOSIS — Z79890 Hormone replacement therapy (postmenopausal): Secondary | ICD-10-CM | POA: Diagnosis not present

## 2017-11-17 DIAGNOSIS — Z794 Long term (current) use of insulin: Secondary | ICD-10-CM | POA: Diagnosis not present

## 2017-11-17 DIAGNOSIS — Z79899 Other long term (current) drug therapy: Secondary | ICD-10-CM | POA: Diagnosis not present

## 2017-11-18 DIAGNOSIS — I10 Essential (primary) hypertension: Secondary | ICD-10-CM | POA: Diagnosis not present

## 2017-11-18 DIAGNOSIS — C649 Malignant neoplasm of unspecified kidney, except renal pelvis: Secondary | ICD-10-CM | POA: Diagnosis not present

## 2017-11-18 DIAGNOSIS — C7802 Secondary malignant neoplasm of left lung: Secondary | ICD-10-CM | POA: Diagnosis not present

## 2017-11-18 DIAGNOSIS — M25461 Effusion, right knee: Secondary | ICD-10-CM | POA: Diagnosis not present

## 2017-11-18 DIAGNOSIS — M2550 Pain in unspecified joint: Secondary | ICD-10-CM | POA: Diagnosis not present

## 2017-11-18 DIAGNOSIS — M1711 Unilateral primary osteoarthritis, right knee: Secondary | ICD-10-CM | POA: Diagnosis not present

## 2017-11-18 DIAGNOSIS — Z794 Long term (current) use of insulin: Secondary | ICD-10-CM | POA: Diagnosis not present

## 2017-11-18 DIAGNOSIS — C641 Malignant neoplasm of right kidney, except renal pelvis: Secondary | ICD-10-CM | POA: Diagnosis not present

## 2017-11-18 DIAGNOSIS — F411 Generalized anxiety disorder: Secondary | ICD-10-CM | POA: Diagnosis not present

## 2017-11-18 DIAGNOSIS — E1065 Type 1 diabetes mellitus with hyperglycemia: Secondary | ICD-10-CM | POA: Diagnosis not present

## 2017-11-18 DIAGNOSIS — Z7952 Long term (current) use of systemic steroids: Secondary | ICD-10-CM | POA: Diagnosis not present

## 2017-11-18 DIAGNOSIS — E291 Testicular hypofunction: Secondary | ICD-10-CM | POA: Diagnosis not present

## 2017-11-18 DIAGNOSIS — M23306 Other meniscus derangements, unspecified meniscus, right knee: Secondary | ICD-10-CM | POA: Diagnosis not present

## 2017-11-18 DIAGNOSIS — E039 Hypothyroidism, unspecified: Secondary | ICD-10-CM | POA: Diagnosis not present

## 2017-11-19 DIAGNOSIS — D649 Anemia, unspecified: Secondary | ICD-10-CM | POA: Diagnosis not present

## 2017-11-19 DIAGNOSIS — D509 Iron deficiency anemia, unspecified: Secondary | ICD-10-CM | POA: Diagnosis not present

## 2017-11-19 DIAGNOSIS — E785 Hyperlipidemia, unspecified: Secondary | ICD-10-CM | POA: Diagnosis not present

## 2017-11-19 DIAGNOSIS — Z9889 Other specified postprocedural states: Secondary | ICD-10-CM | POA: Diagnosis not present

## 2017-11-19 DIAGNOSIS — C641 Malignant neoplasm of right kidney, except renal pelvis: Secondary | ICD-10-CM | POA: Diagnosis not present

## 2017-11-19 DIAGNOSIS — E039 Hypothyroidism, unspecified: Secondary | ICD-10-CM | POA: Diagnosis not present

## 2017-11-19 DIAGNOSIS — E519 Thiamine deficiency, unspecified: Secondary | ICD-10-CM | POA: Diagnosis not present

## 2017-11-19 DIAGNOSIS — Z794 Long term (current) use of insulin: Secondary | ICD-10-CM | POA: Diagnosis not present

## 2017-11-19 DIAGNOSIS — E119 Type 2 diabetes mellitus without complications: Secondary | ICD-10-CM | POA: Diagnosis not present

## 2017-11-19 DIAGNOSIS — Z905 Acquired absence of kidney: Secondary | ICD-10-CM | POA: Diagnosis not present

## 2017-11-19 DIAGNOSIS — Z79899 Other long term (current) drug therapy: Secondary | ICD-10-CM | POA: Diagnosis not present

## 2017-11-19 DIAGNOSIS — C7802 Secondary malignant neoplasm of left lung: Secondary | ICD-10-CM | POA: Diagnosis not present

## 2017-11-19 DIAGNOSIS — C801 Malignant (primary) neoplasm, unspecified: Secondary | ICD-10-CM | POA: Diagnosis not present

## 2017-11-19 DIAGNOSIS — E538 Deficiency of other specified B group vitamins: Secondary | ICD-10-CM | POA: Diagnosis not present

## 2017-11-19 DIAGNOSIS — K3184 Gastroparesis: Secondary | ICD-10-CM | POA: Diagnosis not present

## 2017-11-19 DIAGNOSIS — Z79890 Hormone replacement therapy (postmenopausal): Secondary | ICD-10-CM | POA: Diagnosis not present

## 2017-11-20 DIAGNOSIS — Z794 Long term (current) use of insulin: Secondary | ICD-10-CM | POA: Diagnosis not present

## 2017-11-20 DIAGNOSIS — T380X5D Adverse effect of glucocorticoids and synthetic analogues, subsequent encounter: Secondary | ICD-10-CM | POA: Diagnosis not present

## 2017-11-20 DIAGNOSIS — I10 Essential (primary) hypertension: Secondary | ICD-10-CM | POA: Diagnosis not present

## 2017-11-20 DIAGNOSIS — Z7952 Long term (current) use of systemic steroids: Secondary | ICD-10-CM | POA: Diagnosis not present

## 2017-11-20 DIAGNOSIS — E1065 Type 1 diabetes mellitus with hyperglycemia: Secondary | ICD-10-CM | POA: Diagnosis not present

## 2017-11-21 ENCOUNTER — Other Ambulatory Visit (HOSPITAL_COMMUNITY): Payer: Self-pay | Admitting: Internal Medicine

## 2017-12-08 DIAGNOSIS — B999 Unspecified infectious disease: Secondary | ICD-10-CM | POA: Diagnosis not present

## 2017-12-08 DIAGNOSIS — M25561 Pain in right knee: Secondary | ICD-10-CM | POA: Diagnosis not present

## 2017-12-08 DIAGNOSIS — M1711 Unilateral primary osteoarthritis, right knee: Secondary | ICD-10-CM | POA: Diagnosis not present

## 2017-12-17 DIAGNOSIS — M1711 Unilateral primary osteoarthritis, right knee: Secondary | ICD-10-CM | POA: Diagnosis not present

## 2017-12-23 ENCOUNTER — Other Ambulatory Visit: Payer: Self-pay | Admitting: Oncology

## 2017-12-24 ENCOUNTER — Other Ambulatory Visit: Payer: Self-pay | Admitting: Internal Medicine

## 2017-12-24 DIAGNOSIS — M1711 Unilateral primary osteoarthritis, right knee: Secondary | ICD-10-CM | POA: Diagnosis not present

## 2017-12-24 NOTE — Telephone Encounter (Signed)
Paroxetine (Paxil-CR) 25mg  24hr tab refill. Pt requesting a 90 day supply. Last Refilled by a different provider Last OV: 05/01/17 PCP: Dr. Ronnald Ramp Pharmacy: [facility]

## 2017-12-24 NOTE — Telephone Encounter (Signed)
Copied from [place] [phone] [phone]. Topic: Quick Communication - Rx Refill/Question >> Dec 24, 2017  2:05 PM Burchel, Abbi R wrote: Medication: PARoxetine (PAXIL-CR) 25 MG 24 hr tablet   Pt requesting 90 day supply.    Preferred Pharmacy:[address], [place] [address] Phone: [phone] Fax: [phone]  Pt: [phone]  Pt was advised that RX refills may take up to 3 business days. Pt is out of medication.

## 2017-12-25 MED ORDER — PAROXETINE HCL ER 25 MG PO TB24: 25.0000 mg | ORAL_TABLET | Freq: Every day | ORAL | 0 refills | Status: DC

## 2017-12-26 ENCOUNTER — Other Ambulatory Visit (HOSPITAL_COMMUNITY): Payer: Self-pay | Admitting: Internal Medicine

## 2017-12-29 DIAGNOSIS — E039 Hypothyroidism, unspecified: Secondary | ICD-10-CM | POA: Diagnosis not present

## 2017-12-29 DIAGNOSIS — E109 Type 1 diabetes mellitus without complications: Secondary | ICD-10-CM | POA: Diagnosis not present

## 2017-12-29 DIAGNOSIS — Z794 Long term (current) use of insulin: Secondary | ICD-10-CM | POA: Diagnosis not present

## 2018-02-03 ENCOUNTER — Other Ambulatory Visit: Payer: Self-pay | Admitting: Internal Medicine

## 2018-02-09 DIAGNOSIS — Z79890 Hormone replacement therapy (postmenopausal): Secondary | ICD-10-CM | POA: Diagnosis not present

## 2018-02-09 DIAGNOSIS — Z794 Long term (current) use of insulin: Secondary | ICD-10-CM | POA: Diagnosis not present

## 2018-02-09 DIAGNOSIS — C649 Malignant neoplasm of unspecified kidney, except renal pelvis: Secondary | ICD-10-CM | POA: Diagnosis not present

## 2018-02-09 DIAGNOSIS — C641 Malignant neoplasm of right kidney, except renal pelvis: Secondary | ICD-10-CM | POA: Diagnosis not present

## 2018-02-09 DIAGNOSIS — Z79899 Other long term (current) drug therapy: Secondary | ICD-10-CM | POA: Diagnosis not present

## 2018-02-10 DIAGNOSIS — Z7952 Long term (current) use of systemic steroids: Secondary | ICD-10-CM | POA: Diagnosis not present

## 2018-02-10 DIAGNOSIS — I10 Essential (primary) hypertension: Secondary | ICD-10-CM | POA: Diagnosis not present

## 2018-02-10 DIAGNOSIS — E1065 Type 1 diabetes mellitus with hyperglycemia: Secondary | ICD-10-CM | POA: Diagnosis not present

## 2018-02-10 DIAGNOSIS — M2550 Pain in unspecified joint: Secondary | ICD-10-CM | POA: Diagnosis not present

## 2018-02-10 DIAGNOSIS — C641 Malignant neoplasm of right kidney, except renal pelvis: Secondary | ICD-10-CM | POA: Diagnosis not present

## 2018-02-10 DIAGNOSIS — C7802 Secondary malignant neoplasm of left lung: Secondary | ICD-10-CM | POA: Diagnosis not present

## 2018-02-10 DIAGNOSIS — Z794 Long term (current) use of insulin: Secondary | ICD-10-CM | POA: Diagnosis not present

## 2018-02-18 DIAGNOSIS — M1711 Unilateral primary osteoarthritis, right knee: Secondary | ICD-10-CM | POA: Diagnosis not present

## 2018-02-26 DIAGNOSIS — Z23 Encounter for immunization: Secondary | ICD-10-CM | POA: Diagnosis not present

## 2018-02-26 DIAGNOSIS — E782 Mixed hyperlipidemia: Secondary | ICD-10-CM | POA: Diagnosis not present

## 2018-02-26 DIAGNOSIS — E039 Hypothyroidism, unspecified: Secondary | ICD-10-CM | POA: Diagnosis not present

## 2018-02-26 DIAGNOSIS — Z794 Long term (current) use of insulin: Secondary | ICD-10-CM | POA: Diagnosis not present

## 2018-02-26 DIAGNOSIS — E109 Type 1 diabetes mellitus without complications: Secondary | ICD-10-CM | POA: Diagnosis not present

## 2018-02-26 LAB — LIPID PANEL
Cholesterol: 224 — AB (ref 0–200)
HDL: 38 (ref 35–70)
LDL CALC: 135
TRIGLYCERIDES: 498 — AB (ref 40–160)

## 2018-02-26 LAB — HEPATIC FUNCTION PANEL
ALT: 13 (ref 10–40)
AST: 17 (ref 14–40)

## 2018-02-26 LAB — HEMOGLOBIN A1C: Hgb A1c MFr Bld: 8.4 — AB (ref 4.0–6.0)

## 2018-02-26 LAB — BASIC METABOLIC PANEL
BUN: 24 — AB (ref 4–21)
Creatinine: 1.2 (ref 0.6–1.3)
GLUCOSE: 222
Potassium: 4.1 (ref 3.4–5.3)
SODIUM: 136 — AB (ref 137–147)

## 2018-02-26 LAB — TSH: TSH: 2.74 (ref 0.41–5.90)

## 2018-02-26 LAB — MICROALBUMIN, URINE: Microalb, Ur: 1.4

## 2018-03-24 ENCOUNTER — Other Ambulatory Visit: Payer: Self-pay | Admitting: Internal Medicine

## 2018-04-14 ENCOUNTER — Other Ambulatory Visit (HOSPITAL_COMMUNITY): Payer: Self-pay | Admitting: Internal Medicine

## 2018-05-01 ENCOUNTER — Other Ambulatory Visit (HOSPITAL_COMMUNITY): Payer: Self-pay | Admitting: Internal Medicine

## 2018-05-08 DIAGNOSIS — Z794 Long term (current) use of insulin: Secondary | ICD-10-CM | POA: Diagnosis not present

## 2018-05-08 DIAGNOSIS — E109 Type 1 diabetes mellitus without complications: Secondary | ICD-10-CM | POA: Diagnosis not present

## 2018-06-03 DIAGNOSIS — Z7982 Long term (current) use of aspirin: Secondary | ICD-10-CM | POA: Diagnosis not present

## 2018-06-03 DIAGNOSIS — Z794 Long term (current) use of insulin: Secondary | ICD-10-CM | POA: Diagnosis not present

## 2018-06-03 DIAGNOSIS — C649 Malignant neoplasm of unspecified kidney, except renal pelvis: Secondary | ICD-10-CM | POA: Diagnosis not present

## 2018-06-03 DIAGNOSIS — Z85528 Personal history of other malignant neoplasm of kidney: Secondary | ICD-10-CM | POA: Diagnosis not present

## 2018-06-03 DIAGNOSIS — Z79890 Hormone replacement therapy (postmenopausal): Secondary | ICD-10-CM | POA: Diagnosis not present

## 2018-06-03 DIAGNOSIS — Z905 Acquired absence of kidney: Secondary | ICD-10-CM | POA: Diagnosis not present

## 2018-06-03 DIAGNOSIS — C7802 Secondary malignant neoplasm of left lung: Secondary | ICD-10-CM | POA: Diagnosis not present

## 2018-06-03 DIAGNOSIS — Z79899 Other long term (current) drug therapy: Secondary | ICD-10-CM | POA: Diagnosis not present

## 2018-06-04 DIAGNOSIS — M2550 Pain in unspecified joint: Secondary | ICD-10-CM | POA: Diagnosis not present

## 2018-06-04 DIAGNOSIS — I10 Essential (primary) hypertension: Secondary | ICD-10-CM | POA: Diagnosis not present

## 2018-06-04 DIAGNOSIS — C641 Malignant neoplasm of right kidney, except renal pelvis: Secondary | ICD-10-CM | POA: Diagnosis not present

## 2018-06-04 DIAGNOSIS — C7802 Secondary malignant neoplasm of left lung: Secondary | ICD-10-CM | POA: Diagnosis not present

## 2018-06-04 DIAGNOSIS — E1065 Type 1 diabetes mellitus with hyperglycemia: Secondary | ICD-10-CM | POA: Diagnosis not present

## 2018-06-04 DIAGNOSIS — Z905 Acquired absence of kidney: Secondary | ICD-10-CM | POA: Diagnosis not present

## 2018-06-04 IMAGING — MR MR CARD MORPHOLOGY WO/W CM
11 of 12 series · 39 of 40 positions shown · IV contrast (35    MH)
Comparison: none

ADDENDUM:
Study was resent with additional images not initially present.

Delayed enhancement imaging showed no myocardial late gadolinium
enhancement (LGE), so no definite evidence for prior MI,
myocarditis, or infiltrative disease.
CLINICAL DATA: Cardiomyopathy of uncertain etiology.
EXAM:
CARDIAC MRI
TECHNIQUE: The patient was scanned on a 1.5 Tesla GE magnet. A dedicated
cardiac coil was used. Functional imaging was done using Fiesta
sequences. [DATE], and 4 chamber views were done to assess for RWMA's.
Modified Naga rule using a short axis stack was used to
calculate an ejection fraction on a dedicated work station using
Circle software. The patient received 35 cc of Multihance. After 10
minutes inversion recovery sequences were used to assess for
infiltration and scar tissue.
CONTRAST:  35 cc Multihance

[Series 3: bSSFP · sagittal · 8.0mm · 1.48mm/px · 1 of 18 slices shown (1 of 5)]
[im 1/18]
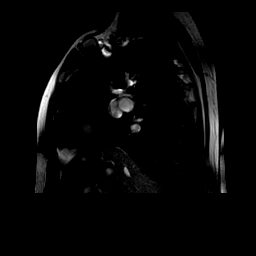

[Series 4: bSSFP · axial · 8.0mm · 1.56mm/px · z∈[-106,-53]mm · 7 of 180 slices shown (2 of 5)]
[im 1/180]
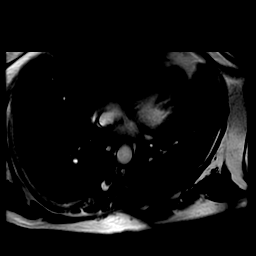
[im 30/180]
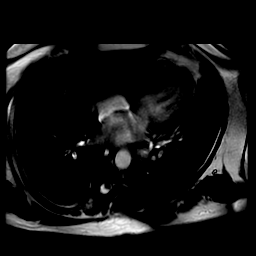
[im 60/180]
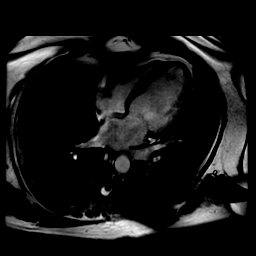
[im 90/180]
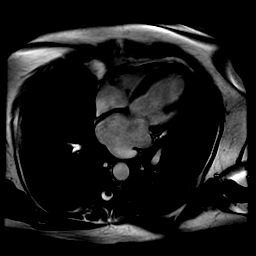
[im 120/180]
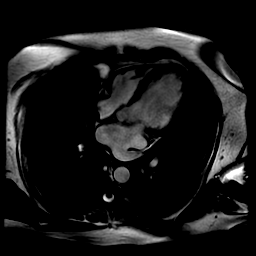
[im 150/180]
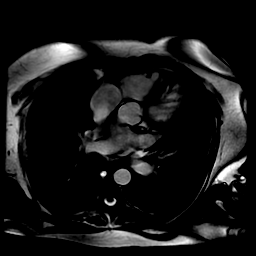
[im 180/180]
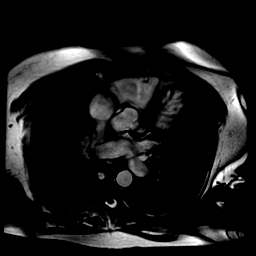

[Series 5: bSSFP · oblique · 8.0mm · 1.56mm/px · 16 of 360 slices shown (3 of 5)]
[im 1/360]
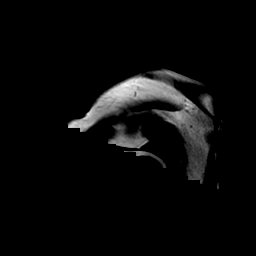
[im 24/360]
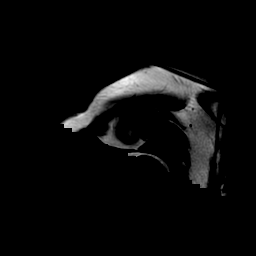
[im 48/360]
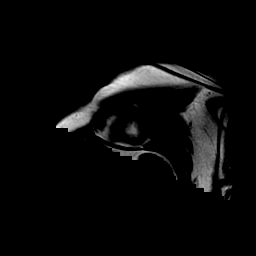
[im 72/360]
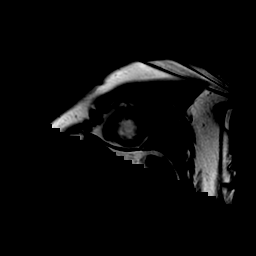
[im 96/360]
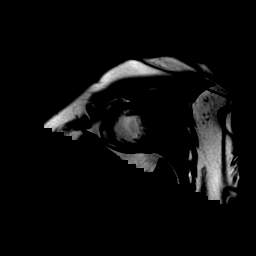
[im 120/360]
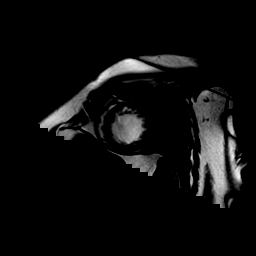
[im 144/360]
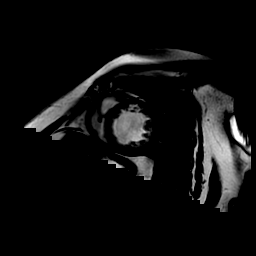
[im 168/360]
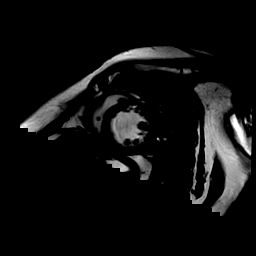
[im 192/360]
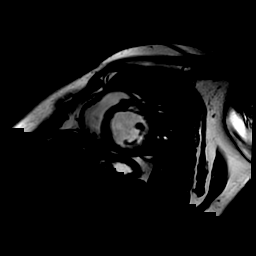
[im 216/360]
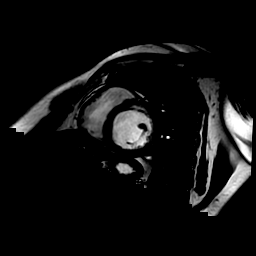
[im 240/360]
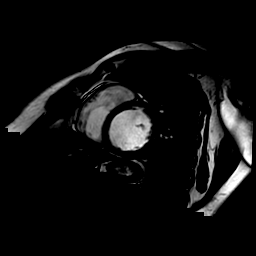
[im 264/360]
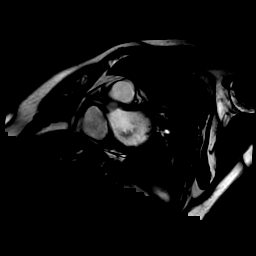
[im 288/360]
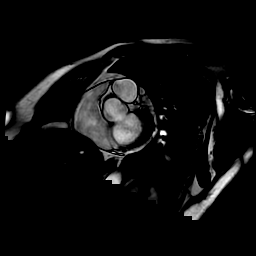
[im 312/360]
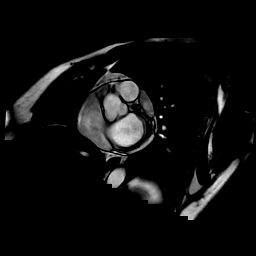
[im 336/360]
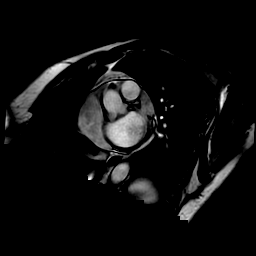
[im 360/360]
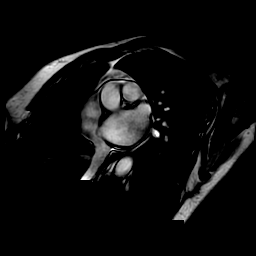

[Series 6: tags grid sa · oblique · 8.0mm · 1.56mm/px · 3 of 60 slices shown]
[im 1/60]
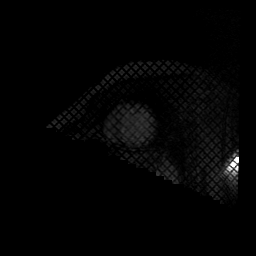
[im 30/60]
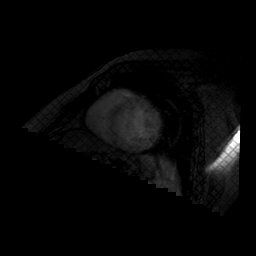
[im 60/60]
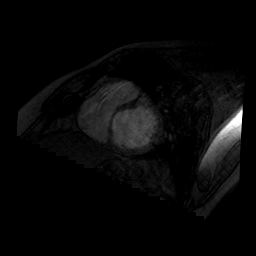

[Series 7: tags grid rad · axial · 8.0mm · 1.52mm/px · z∈[-130,+276]mm · 3 of 60 slices shown]
[im 1/60]
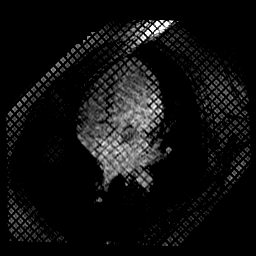
[im 30/60]
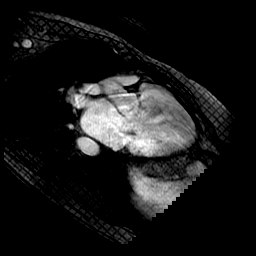
[im 60/60]
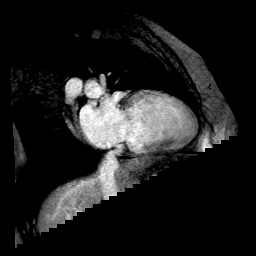

[Series 8: bSSFP · axial · 8.0mm · 1.52mm/px · z∈[-130,+276]mm · 3 of 60 slices shown (4 of 5)]
[im 1/60]
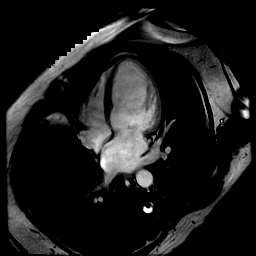
[im 30/60]
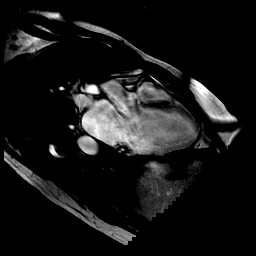
[im 60/60]
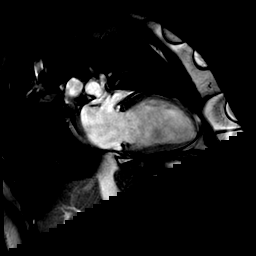

[Series 10: bSSFP · oblique · 8.0mm · 1.52mm/px · 2 of 40 slices shown (5 of 5)]
[im 1/40]
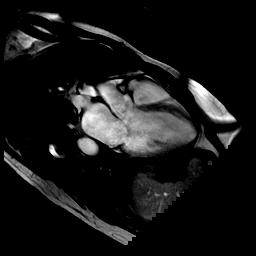
[im 40/40]
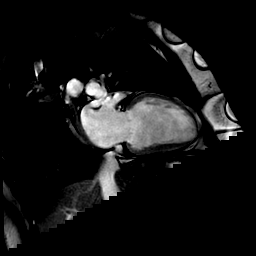

[Series 11: cine ir · oblique · 8.0mm · 1.48mm/px · 1 of 30 slices shown]
[im 1/30]
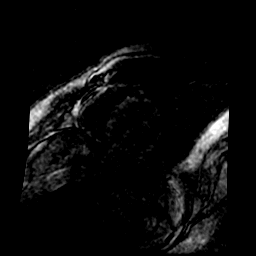

[Series 14: delayed ir prep · oblique · 8.0mm · 1.48mm/px · 1 of 18 slices shown]
[im 1/18]
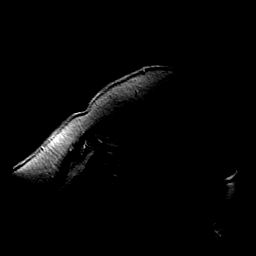

[Series 15: rad mde · axial · 8.0mm · 1.52mm/px · 1 of 3 slices shown (1 of 2)]
[im 1/3]
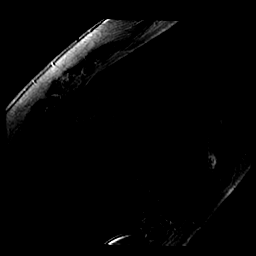

[Series 16: rad mde · axial · 8.0mm · 1.56mm/px · 1 of 1 slices shown (2 of 2)]
[im 1/1]
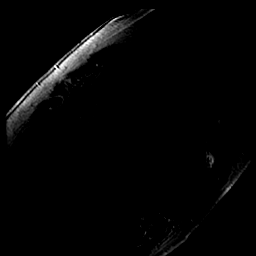

[39 of 40 positions shown; findings below may reference images not displayed]

FINDINGS: Limited images of the lung fields showed no gross abnormalities.

Mild to moderately dilated left ventricle with normal wall
thickness. EF 28% with diffuse hypokinesis. No evidence for LV
noncompaction. Normal right ventricular size with mildly decreased
systolic function. Moderate left atrial enlargement. Normal right
atrial size. Trileaflet aortic valve with no stenosis, trivial
regurgitation. Mild mitral regurgitation.

Delayed enhancement images were inadequate for diagnostic purposes.

MEASUREMENTS:
MEASUREMENTS
LV EDV 302 mL

LV SV 86 mL

LV EF 28%
IMPRESSION: 1.  Mild to moderate LV dilation with EF 28%, diffuse hypokinesis.

2.  Normal RV size with mildly decreased systolic function.

3. Delayed enhancement images not adequate for diagnosis. I will
check with MRI tech to see if there are additional images that were
not sent and addend study if available.

Hj Dan Detailer

## 2018-06-09 DIAGNOSIS — E109 Type 1 diabetes mellitus without complications: Secondary | ICD-10-CM | POA: Diagnosis not present

## 2018-06-09 DIAGNOSIS — Z794 Long term (current) use of insulin: Secondary | ICD-10-CM | POA: Diagnosis not present

## 2018-06-09 DIAGNOSIS — Z23 Encounter for immunization: Secondary | ICD-10-CM | POA: Diagnosis not present

## 2018-06-09 DIAGNOSIS — E039 Hypothyroidism, unspecified: Secondary | ICD-10-CM | POA: Diagnosis not present

## 2018-06-15 ENCOUNTER — Encounter (HOSPITAL_COMMUNITY): Payer: Self-pay | Admitting: Internal Medicine

## 2018-06-15 ENCOUNTER — Ambulatory Visit (HOSPITAL_COMMUNITY)
Admission: RE | Admit: 2018-06-15 | Discharge: 2018-06-15 | Disposition: A | Discharge status: Home / Self Care | Payer: BLUE CROSS/BLUE SHIELD | Source: Ambulatory Visit | Attending: Internal Medicine | Admitting: Internal Medicine

## 2018-06-15 VITALS — BP 132/78 | HR 80 | Wt 280.0 lb

## 2018-06-15 DIAGNOSIS — R001 Bradycardia, unspecified: Secondary | ICD-10-CM | POA: Insufficient documentation

## 2018-06-15 DIAGNOSIS — R42 Dizziness and giddiness: Secondary | ICD-10-CM | POA: Diagnosis not present

## 2018-06-15 DIAGNOSIS — R918 Other nonspecific abnormal finding of lung field: Secondary | ICD-10-CM | POA: Insufficient documentation

## 2018-06-15 DIAGNOSIS — Z7901 Long term (current) use of anticoagulants: Secondary | ICD-10-CM | POA: Diagnosis not present

## 2018-06-15 DIAGNOSIS — E109 Type 1 diabetes mellitus without complications: Secondary | ICD-10-CM | POA: Diagnosis not present

## 2018-06-15 DIAGNOSIS — I420 Dilated cardiomyopathy: Secondary | ICD-10-CM | POA: Insufficient documentation

## 2018-06-15 DIAGNOSIS — Z794 Long term (current) use of insulin: Secondary | ICD-10-CM | POA: Diagnosis not present

## 2018-06-15 DIAGNOSIS — C641 Malignant neoplasm of right kidney, except renal pelvis: Secondary | ICD-10-CM | POA: Diagnosis not present

## 2018-06-15 DIAGNOSIS — I5022 Chronic systolic (congestive) heart failure: Secondary | ICD-10-CM | POA: Diagnosis not present

## 2018-06-15 DIAGNOSIS — Z905 Acquired absence of kidney: Secondary | ICD-10-CM | POA: Diagnosis not present

## 2018-06-15 DIAGNOSIS — Z85528 Personal history of other malignant neoplasm of kidney: Secondary | ICD-10-CM | POA: Insufficient documentation

## 2018-06-15 DIAGNOSIS — R3129 Other microscopic hematuria: Secondary | ICD-10-CM | POA: Diagnosis not present

## 2018-06-15 DIAGNOSIS — G629 Polyneuropathy, unspecified: Secondary | ICD-10-CM | POA: Insufficient documentation

## 2018-06-15 DIAGNOSIS — Z8249 Family history of ischemic heart disease and other diseases of the circulatory system: Secondary | ICD-10-CM | POA: Diagnosis not present

## 2018-06-15 DIAGNOSIS — Z79899 Other long term (current) drug therapy: Secondary | ICD-10-CM | POA: Diagnosis not present

## 2018-06-15 DIAGNOSIS — I2510 Atherosclerotic heart disease of native coronary artery without angina pectoris: Secondary | ICD-10-CM | POA: Insufficient documentation

## 2018-06-15 DIAGNOSIS — E781 Pure hyperglyceridemia: Secondary | ICD-10-CM | POA: Insufficient documentation

## 2018-06-15 MED ORDER — SACUBITRIL-VALSARTAN 49-51 MG PO TABS: 1.0000 | ORAL_TABLET | Freq: Two times a day (BID) | ORAL | 3 refills | Status: DC

## 2018-06-15 NOTE — Patient Instructions (Signed)
START taking Entresto 49/51mg  (1 tab) twice a day  Your physician has requested that you have an echocardiogram. Echocardiography is a painless test that uses sound waves to create images of your heart. It provides your doctor with information about the size and shape of your heart and how well your heart's chambers and valves are working. This procedure takes approximately one hour. There are no restrictions for this procedure. This should be done in 3 months. They will call you to schedule this appointment.   Your physician recommends that you schedule a follow-up appointment in: 6 months with Dr. Haroldine Laws.  Please call in May to schedule this appointment.

## 2018-06-15 NOTE — Progress Notes (Addendum)
ADVANCED HF CLINIC NOTE  Referring Physician:  Lennie Hummer MD  Primary Cardiologist: None  HPI:  Gregory [name] is a 58 y/o male with h/o obesity, metastatic R renal cell CA s/p R nephrectomy in 9/17 with the subsequent discovery of 2 pulmonary nodules whom we follow for non-ischemic cardiomyopathy.  Developed microscopic hematuria and found to have large right renal mass c/w renal cell CA. Eventually underwent right radical nephrectomy with adrenal sparing on 02/07/16 at [facility]. Post-op developed bradycardia with HRs in 30s while sleeping. BP stable. When awakened had dizziness so given atropine. ECG revealed sinus brady in 40s with mild QT prolongation which resolved. Echo done and EF 45% with global HK. RV dilated  With normal function .  We saw him for the first time in October 2017. At that time we ordered cMRI and stress test. However in the interim he went back to [facility]. Found to have 2 metastatic lung nodules Echo showed EF at 46%. However MUGA with EF 28% so referred back to [place] for further evaluation prior to chemo. Underwent cath 04/09/16 as below. Had chemo and lung nodule resection.   1. 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 Otherwise normal coronaries 3. Nonischemic, dilated cardiomyopathy with EF 30-35% and global hypokinesis  cMRI 05/29/16 EF 28% No LGE  Had sleep study which was negative.   He is here today for regular follow up. Just got back from [facility] and scans were cancer free. Doing well. Denies SOB, edema, orthopnea or PND. BP stable. BPs have been running 115-120/70s. Only taking Entresto once daily. Has gained about 30 pounds.    ECHO 09/12/16 35-40%, RV ok. - Reviewed personally  Echo 05/22/17: EF 50-55%, grade 1 DD.   FHx:   Father died at 80 from HF (unknown CAD status) Mother died 67 from brain cancer Sister fine    Past Medical History:  Diagnosis Date  . Allergy   .  Anemia    low iron  . Anxiety   . Cancer [facility])    Renal cell cancer  . Complex tear of lateral meniscus of right knee as current injury 03/20/2017  . Complex tear of medial meniscus of right knee 03/20/2017  . Diabetes mellitus without complication ([place])   . History of gastrointestinal hemorrhage   . Hypertension   . Hypertriglyceridemia   . Macular degeneration    lasar surgery- Dr Zigmund Daniel  . Neuropathy   . Nonischemic cardiomyopathy ([place])    EF 25-30% by echo 09/12/16  . Plantar warts   . PONV (postoperative nausea and vomiting)    " a little bit of nausea"  . Primary localized osteoarthritis of right knee 03/20/2017  . Renal cell carcinoma [facility])    s/p nephrectomy 02/07/16 at [facility])    Current Outpatient Medications  Medication Sig Dispense Refill  . atorvastatin (LIPITOR) 10 MG tablet Take by mouth.    . blood glucose meter kit and supplies KIT Dispense based on patient and insurance preference. Use up to four times daily as directed. (FOR ICD-9 250.00, 250.01). 1 each 0  . carvedilol (COREG) 6.25 MG tablet TAKE 1 TABLET BY MOUTH TWICE DAILY. 60 tablet 0  . cetirizine (ZYRTEC) 10 MG tablet Take 10 mg by mouth daily.    . insulin aspart (NOVOLOG FLEXPEN) 100 UNIT/ML FlexPen Add to meal coverage Sliding scale CBG 70 - 120: 0 units CBG 121 - 150: 1 unit,  CBG 151 - 200: 2 units,  CBG 201 - 250: 3 units,  CBG 251 - 300: 5 units,  CBG 301 - 350: 7 units,  CBG 351 - 400: 9 units   CBG > 400: 9 units and notify your MD (Patient taking differently: Inject 7-18 Units into the skin 3 (three) times daily with meals. Per sliding scale Add to meal coverage Sliding scale CBG 70 - 120: 0 units CBG 121 - 150: 1 unit,  CBG 151 - 200: 2 units,  CBG 201 - 250: 3 units,  CBG 251 - 300: 5 units,  CBG 301 - 350: 7 units,  CBG 351 - 400: 9 units   CBG > 400: 9 units and notify your MD) 15 mL 11  . Insulin Glargine (LANTUS) 100 UNIT/ML Solostar Pen Inject 20 Units into the skin at bedtime. (Patient  taking differently: Inject 34 Units into the skin at bedtime. ) 15 mL 3  . Iron-Folic [id number] (FERRAPLUS 90) 90-1 MG TABS Take 1 tablet by mouth daily. 30 tablet 5  . levothyroxine (SYNTHROID, LEVOTHROID) 25 MCG tablet TAKE 1 TABLET ONCE DAILY BEFORE BREAKFAST 30 tablet 0  . PARoxetine (PAXIL-CR) 25 MG 24 hr tablet TAKE 1 TABLET EACH DAY. 90 tablet 0  . predniSONE (DELTASONE) 5 MG tablet Take 15 mg by mouth daily with breakfast.    . sacubitril-valsartan (ENTRESTO) 49-51 MG Take 1 tablet by mouth 2 (two) times daily. (Patient taking differently: Take 1 tablet by mouth daily. ) 180 tablet 3  . sennosides-docusate sodium (SENOKOT-S) 8.6-50 MG tablet Take 2 tablets by mouth daily. 30 tablet 1  . thiamine (VITAMIN B-1) 100 MG tablet Take 1 tablet (100 mg total) by mouth daily. 90 tablet 1  . Tocilizumab (ACTEMRA) 162 MG/0.9ML SOSY Inject into the skin every 14 (fourteen) days.     No current facility-administered medications for this encounter.     No Known Allergies    Social History   Socioeconomic History  . Marital status: Married    Spouse name: Not on file  . Number of children: Not on file  . Years of education: Not on file  . Highest education level: Not on file  Occupational History  . Not on file  Social Needs  . Financial resource strain: Not on file  . Food insecurity:    Worry: Not on file    Inability: Not on file  . Transportation needs:    Medical: Not on file    Non-medical: Not on file  Tobacco Use  . Smoking status: Never Smoker  . Smokeless tobacco: Never Used  Substance and Sexual Activity  . Alcohol use: Yes    Alcohol/week: 0.0 standard drinks    Comment: social  . Drug use: No  . Sexual activity: Not on file  Lifestyle  . Physical activity:    Days per week: Not on file    Minutes per session: Not on file  . Stress: Not on file  Relationships  . Social connections:    Talks on phone: Not on file    Gets together: Not on file     Attends religious service: Not on file    Active member of club or organization: Not on file    Attends meetings of clubs or organizations: Not on file    Relationship status: Not on file  . Intimate partner violence:    Fear of current or ex partner: Not on file    Emotionally abused: Not on  file    Physically abused: Not on file    Forced sexual activity: Not on file  Other Topics Concern  . 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       Family History  Problem Relation Age of Onset  . Hypertension Mother   . Bipolar disorder Mother   . Cancer Mother 54       Brain  . Other Father        brain tumor  . Colon cancer Neg Hx   . Colon polyps Neg Hx   . Rectal cancer Neg Hx   . Stomach cancer Neg Hx   . Early death Neg Hx   . Hyperlipidemia Neg Hx   . Kidney disease Neg Hx     Vitals:   06/15/18 0915  BP: 132/78  Pulse: 92  SpO2: 98%  Weight: 127 kg (280 lb)   Wt Readings from Last 3 Encounters:  06/15/18 127 kg (280 lb)  08/18/17 111.5 kg (245 lb 12.8 oz)  05/19/17 95.1 kg (209 lb 12 oz)    PHYSICAL EXAM: General:  Well appearing. No resp difficulty HEENT: normal Neck: supple. no JVD. Carotids 2+ bilat; no bruits. No lymphadenopathy or thryomegaly appreciated. Cor: PMI nondisplaced. Regular rate & rhythm. No rubs, gallops or murmurs. Lungs: clear Abdomen: obese soft, nontender, nondistended. No hepatosplenomegaly. No bruits or masses. Good bowel sounds. Extremities: no cyanosis, clubbing, rash, edema Neuro: alert & orientedx3, cranial nerves grossly intact. moves all 4 extremities w/o difficulty. Affect pleasant   ECG: Sinus brady 42 QRS 96 QTC 42m (02/07/16)              NSR 76 QRS 80 QTc 459 No ST-T wave abnormalities. (05/19/17)   ASSESSMENT & PLAN: 1. NICM:   - Unclear etiology. EF  30-35% by cath with NICM. cMRI 1/18 with EF 28%. Etiology remains unclear. No evidence of scar of infiltrative process on MRI. Echo 4/18 EF 35-40%. Echo 04/2017 EF 50-55% -  Stable NYHA I-II, volume status stable. -  Continue carvedilol to 6.25 bid -  Only taking Entresto 49/51 mg daily. Will increase to BID -  Echo in 3 months. F/u with me in 6 months.  2. Metastatic Renal Cell CA with lung nodules - s/p nephrectomy. Currently stable. Recent studies confirm 5 month remission 3. HTN:  -Blood pressure well controlled with Entresto. 4. Snoring - Recent sleep study negative.  5. CAD - 1v in small RCA. Agree with low-dose ASA and statin 6. Type I DM - Followed by Dr. KLaqueta Carina MD 9:39 AM

## 2018-06-29 ENCOUNTER — Other Ambulatory Visit (HOSPITAL_COMMUNITY): Payer: Self-pay | Admitting: Internal Medicine

## 2018-06-29 ENCOUNTER — Telehealth: Payer: Self-pay | Admitting: Internal Medicine

## 2018-07-01 NOTE — Telephone Encounter (Signed)
Patient is needing a Pre-Authorization for this medication. The contact number is [phone]. ID #- [id number]. The medication is at the [facility] waiting for authorization.  Patient is completely out. Please advise

## 2018-07-01 NOTE — Telephone Encounter (Signed)
Key: [id number]  Pt has not been seen since 2018

## 2018-07-01 NOTE — Telephone Encounter (Signed)
Appointment made for 07/29/18

## 2018-07-02 ENCOUNTER — Other Ambulatory Visit: Payer: Self-pay | Admitting: Internal Medicine

## 2018-07-02 DIAGNOSIS — F418 Other specified anxiety disorders: Secondary | ICD-10-CM

## 2018-07-02 MED ORDER — PAROXETINE HCL 30 MG PO TABS: 30.0000 mg | ORAL_TABLET | Freq: Every day | ORAL | 1 refills | Status: DC

## 2018-07-02 NOTE — Telephone Encounter (Signed)
Are you okay with changing rx to NO Extended Release?   Insurance is not covering the extended release version.

## 2018-07-02 NOTE — Telephone Encounter (Signed)
Wife calling back.  States that she spoke with [employer] about PA and they state that the PA was not marked urgent or the doctor has to do a peer to peer.  Pt is experiencing headaches and side effects of withdrawal at this time. Pt's wife would like to know what needs to be done. Earnest Bailey can be reached at [phone]

## 2018-07-02 NOTE — Telephone Encounter (Signed)
RX sent

## 2018-07-02 NOTE — Addendum Note (Signed)
Addended by: Karle Barr on: 07/02/2018 04:16 PM   Modules accepted: Orders

## 2018-07-02 NOTE — Telephone Encounter (Signed)
Pt's wife called and stated that she talked with the pharmacist and they are recommending Paroxotine 30mg  with no extended release because that is a dosage the insurance company will cover. They stated there is no benefit to the current rx. Pt's wife stated pt has been without medication since Sunday and is starting to have withdrawals. Please reach out to pt for further questions and to let him know if Dr. Ronnald Ramp will approve the change. CB#[phone]  [place], [place] [phone] (Phone) [phone] (Fax)

## 2018-07-02 NOTE — Telephone Encounter (Signed)
Patient is calling for an update, he is out of the medication and feels like he is starting to feel it. Please call patient. [phone]

## 2018-07-02 NOTE — Telephone Encounter (Signed)
Wife following up on the PA.  She advised pt has been out of this med since Sunday and not doing well.  She is going to follow up with his insurance and may have to buy a few to get him through to his PA goes through.

## 2018-07-02 NOTE — Telephone Encounter (Signed)
Pt informed rx has been sent.  

## 2018-07-28 ENCOUNTER — Other Ambulatory Visit (HOSPITAL_COMMUNITY): Payer: Self-pay | Admitting: Internal Medicine

## 2018-07-29 ENCOUNTER — Ambulatory Visit (INDEPENDENT_AMBULATORY_CARE_PROVIDER_SITE_OTHER): Payer: BLUE CROSS/BLUE SHIELD | Admitting: Internal Medicine

## 2018-07-29 ENCOUNTER — Other Ambulatory Visit (INDEPENDENT_AMBULATORY_CARE_PROVIDER_SITE_OTHER): Payer: BLUE CROSS/BLUE SHIELD

## 2018-07-29 ENCOUNTER — Encounter: Payer: Self-pay | Admitting: Internal Medicine

## 2018-07-29 VITALS — BP 142/88 | HR 78 | Temp 98.2°F | Resp 16 | Ht 74.0 in | Wt 282.8 lb

## 2018-07-29 DIAGNOSIS — D508 Other iron deficiency anemias: Secondary | ICD-10-CM

## 2018-07-29 DIAGNOSIS — Z794 Long term (current) use of insulin: Secondary | ICD-10-CM

## 2018-07-29 DIAGNOSIS — Z23 Encounter for immunization: Secondary | ICD-10-CM

## 2018-07-29 DIAGNOSIS — E119 Type 2 diabetes mellitus without complications: Secondary | ICD-10-CM

## 2018-07-29 DIAGNOSIS — I10 Essential (primary) hypertension: Secondary | ICD-10-CM

## 2018-07-29 DIAGNOSIS — D513 Other dietary vitamin B12 deficiency anemia: Secondary | ICD-10-CM

## 2018-07-29 DIAGNOSIS — E519 Thiamine deficiency, unspecified: Secondary | ICD-10-CM

## 2018-07-29 DIAGNOSIS — G63 Polyneuropathy in diseases classified elsewhere: Secondary | ICD-10-CM

## 2018-07-29 DIAGNOSIS — R972 Elevated prostate specific antigen [PSA]: Secondary | ICD-10-CM

## 2018-07-29 DIAGNOSIS — IMO0001 Reserved for inherently not codable concepts without codable children: Secondary | ICD-10-CM

## 2018-07-29 DIAGNOSIS — Z0000 Encounter for general adult medical examination without abnormal findings: Secondary | ICD-10-CM

## 2018-07-29 DIAGNOSIS — E538 Deficiency of other specified B group vitamins: Secondary | ICD-10-CM | POA: Insufficient documentation

## 2018-07-29 DIAGNOSIS — E559 Vitamin D deficiency, unspecified: Secondary | ICD-10-CM

## 2018-07-29 DIAGNOSIS — E781 Pure hyperglyceridemia: Secondary | ICD-10-CM

## 2018-07-29 DIAGNOSIS — E785 Hyperlipidemia, unspecified: Secondary | ICD-10-CM

## 2018-07-29 LAB — CBC WITH DIFFERENTIAL/PLATELET
Basophils Absolute: 0 10*3/uL (ref 0.0–0.1)
Basophils Relative: 0.8 % (ref 0.0–3.0)
Eosinophils Absolute: 0.2 10*3/uL (ref 0.0–0.7)
Eosinophils Relative: 4.5 % (ref 0.0–5.0)
HCT: 42.2 % (ref 39.0–52.0)
Hemoglobin: 14.2 g/dL (ref 13.0–17.0)
Lymphocytes Relative: 25.8 % (ref 12.0–46.0)
Lymphs Abs: 1.2 10*3/uL (ref 0.7–4.0)
MCHC: 33.7 g/dL (ref 30.0–36.0)
MCV: 87.2 fl (ref 78.0–100.0)
MONOS PCT: 7.4 % (ref 3.0–12.0)
Monocytes Absolute: 0.3 10*3/uL (ref 0.1–1.0)
Neutro Abs: 2.9 10*3/uL (ref 1.4–7.7)
Neutrophils Relative %: 61.5 % (ref 43.0–77.0)
Platelets: 187 10*3/uL (ref 150.0–400.0)
RBC: 4.84 Mil/uL (ref 4.22–5.81)
RDW: 13.2 % (ref 11.5–15.5)
WBC: 4.7 10*3/uL (ref 4.0–10.5)

## 2018-07-29 LAB — COMPREHENSIVE METABOLIC PANEL
ALT: 31 U/L (ref 0–53)
AST: 28 U/L (ref 0–37)
Albumin: 4.2 g/dL (ref 3.5–5.2)
Alkaline Phosphatase: 94 U/L (ref 39–117)
BUN: 25 mg/dL — ABNORMAL HIGH (ref 6–23)
CO2: 28 mEq/L (ref 19–32)
Calcium: 9.1 mg/dL (ref 8.4–10.5)
Chloride: 100 mEq/L (ref 96–112)
Creatinine, Ser: 1.11 mg/dL (ref 0.40–1.50)
GFR: 67.84 mL/min (ref >60.00)
Glucose, Bld: 281 mg/dL — ABNORMAL HIGH (ref 70–99)
Potassium: 4.4 mEq/L (ref 3.5–5.1)
Sodium: 135 mEq/L (ref 135–145)
Total Bilirubin: 0.6 mg/dL (ref 0.2–1.2)
Total Protein: 6.7 g/dL (ref 6.0–8.3)

## 2018-07-29 LAB — LDL CHOLESTEROL, DIRECT: Direct LDL: 115 mg/dL

## 2018-07-29 LAB — LIPID PANEL
Cholesterol: 183 mg/dL (ref 0–200)
HDL: 47 mg/dL (ref >39.00)
NonHDL: 136.43
Total CHOL/HDL Ratio: 4
Triglycerides: 228 mg/dL — ABNORMAL HIGH (ref 0.0–149.0)
VLDL: 45.6 mg/dL — ABNORMAL HIGH (ref 0.0–40.0)

## 2018-07-29 LAB — VITAMIN B12: Vitamin B-12: 191 pg/mL — ABNORMAL LOW (ref 211–911)

## 2018-07-29 LAB — PSA: PSA: 1.1

## 2018-07-29 LAB — IBC PANEL
Iron: 96 ug/dL (ref 42–165)
Saturation Ratios: 32.5 % (ref 20.0–50.0)
Transferrin: 211 mg/dL — ABNORMAL LOW (ref 212.0–360.0)

## 2018-07-29 LAB — FERRITIN: Ferritin: 235 ng/mL (ref 22.0–322.0)

## 2018-07-29 LAB — FOLATE: FOLATE: 20.9 ng/mL (ref >5.9)

## 2018-07-29 LAB — VITAMIN D 25 HYDROXY (VIT D DEFICIENCY, FRACTURES): VITD: 21.37 ng/mL — ABNORMAL LOW (ref 30.00–100.00)

## 2018-07-29 LAB — HEMOGLOBIN A1C: Hgb A1c MFr Bld: 8.6 % — ABNORMAL HIGH (ref 4.6–6.5)

## 2018-07-29 LAB — TSH: TSH: 2.09 u[IU]/mL (ref 0.35–4.50)

## 2018-07-29 MED ORDER — ATORVASTATIN CALCIUM 20 MG PO TABS: 20.0000 mg | ORAL_TABLET | Freq: Every day | ORAL | 1 refills | Status: DC

## 2018-07-29 MED ORDER — CHOLECALCIFEROL 1.25 MG (50000 UT) PO CAPS: 50000.0000 [IU] | ORAL_CAPSULE | ORAL | 0 refills | Status: DC

## 2018-07-29 NOTE — Progress Notes (Signed)
Subjective:  Patient ID: Gregory [name], male    DOB: 05/13/1961  Age: 58 y.o. MRN: [id number]  CC: Annual Exam; Anemia; Hypertension; and Diabetes   HPI [name] presents for a CPX.  Gregory [name] complains of a several month history of tingling and numbness in both feet.  Gregory [name] tells me Gregory [name] is taking an oral B12 supplement.  Gregory [name] does not know the dose.  Gregory [name] also complains of weight gain.  Gregory [name] does not monitor his blood pressure or his blood sugar.  Gregory [name] denies CP, DOE, palpitations, edema, or fatigue.   Outpatient Medications Prior to Visit  Medication Sig Dispense Refill  . blood glucose meter kit and supplies KIT Dispense based on patient and insurance preference. Use up to four times daily as directed. (FOR ICD-9 250.00, 250.01). 1 each 0  . carvedilol (COREG) 6.25 MG tablet TAKE 1 TABLET BY MOUTH TWICE DAILY. 60 tablet 0  . cetirizine (ZYRTEC) 10 MG tablet Take 10 mg by mouth daily.    . insulin aspart (NOVOLOG FLEXPEN) 100 UNIT/ML FlexPen Add to meal coverage Sliding scale CBG 70 - 120: 0 units CBG 121 - 150: 1 unit,  CBG 151 - 200: 2 units,  CBG 201 - 250: 3 units,  CBG 251 - 300: 5 units,  CBG 301 - 350: 7 units,  CBG 351 - 400: 9 units   CBG > 400: 9 units and notify your MD (Patient taking differently: Inject 7-18 Units into the skin 3 (three) times daily with meals. Per sliding scale Add to meal coverage Sliding scale CBG 70 - 120: 0 units CBG 121 - 150: 1 unit,  CBG 151 - 200: 2 units,  CBG 201 - 250: 3 units,  CBG 251 - 300: 5 units,  CBG 301 - 350: 7 units,  CBG 351 - 400: 9 units   CBG > 400: 9 units and notify your MD) 15 mL 11  . levothyroxine (SYNTHROID, LEVOTHROID) 25 MCG tablet TAKE 1 TABLET ONCE DAILY BEFORE BREAKFAST 30 tablet 0  . PARoxetine (PAXIL) 30 MG tablet Take 1 tablet (30 mg total) by mouth daily. 90 tablet 1  . sacubitril-valsartan (ENTRESTO) 49-51 MG Take 1 tablet by mouth 2 (two) times daily. 180 tablet 3  . thiamine (VITAMIN B-1) 100 MG tablet Take 1 tablet (100 mg total)  by mouth daily. 90 tablet 1  . atorvastatin (LIPITOR) 10 MG tablet Take by mouth.    . Insulin Glargine (LANTUS) 100 UNIT/ML Solostar Pen Inject 20 Units into the skin at bedtime. (Patient taking differently: Inject 34 Units into the skin at bedtime. ) 15 mL 3  . Iron-Folic [id number] (FERRAPLUS 90) 90-1 MG TABS Take 1 tablet by mouth daily. 30 tablet 5  . predniSONE (DELTASONE) 5 MG tablet Take 15 mg by mouth daily with breakfast.    . sennosides-docusate sodium (SENOKOT-S) 8.6-50 MG tablet Take 2 tablets by mouth daily. 30 tablet 1  . Tocilizumab (ACTEMRA) 162 MG/0.9ML SOSY Inject into the skin every 14 (fourteen) days.     No facility-administered medications prior to visit.     ROS Review of Systems  Constitutional: Positive for unexpected weight change. Negative for appetite change, diaphoresis and fatigue.  HENT: Negative.  Negative for trouble swallowing.   Eyes: Negative for visual disturbance.  Respiratory: Negative for choking, shortness of breath and wheezing.   Cardiovascular: Negative for chest pain, palpitations and leg swelling.  Gastrointestinal: Negative for abdominal pain, constipation, diarrhea, nausea and vomiting.  Endocrine:  Negative.  Negative for polydipsia, polyphagia and polyuria.  Genitourinary: Negative.  Negative for decreased urine volume and difficulty urinating.  Musculoskeletal: Negative.  Negative for arthralgias and neck stiffness.  Skin: Negative.  Negative for color change, pallor and rash.  Neurological: Positive for numbness. Negative for dizziness and weakness.  Hematological: Negative for adenopathy. Does not bruise/bleed easily.  Psychiatric/Behavioral: Negative.     Objective:  BP (!) 142/88 (BP Location: Left Arm, Patient Position: Sitting, Cuff Size: Normal)   Pulse 78   Temp 98.2 F (36.8 C) (Oral)   Resp 16   Ht '6\' 2"'  (1.88 m)   Wt 282 lb 12 oz (128.3 kg)   SpO2 98%   BMI 36.30 kg/m   BP Readings from Last 3 Encounters:    07/29/18 (!) 142/88  06/15/18 132/78  08/18/17 134/80    Wt Readings from Last 3 Encounters:  07/29/18 282 lb 12 oz (128.3 kg)  06/15/18 280 lb (127 kg)  08/18/17 245 lb 12.8 oz (111.5 kg)    Physical Exam Constitutional:      Appearance: Gregory [name] is obese. Gregory [name] is not ill-appearing or diaphoretic.  HENT:     Nose: Nose normal. No congestion.     Mouth/Throat:     Mouth: Mucous membranes are moist.     Pharynx: Oropharynx is clear. No oropharyngeal exudate or posterior oropharyngeal erythema.  Eyes:     General: No scleral icterus.    Conjunctiva/sclera: Conjunctivae normal.  Neck:     Musculoskeletal: Normal range of motion and neck supple. No neck rigidity.  Cardiovascular:     Rate and Rhythm: Normal rate and regular rhythm.     Heart sounds: No murmur. No gallop.   Pulmonary:     Effort: Pulmonary effort is normal. No respiratory distress.     Breath sounds: No stridor. No wheezing, rhonchi or rales.  Abdominal:     General: Abdomen is flat. Bowel sounds are normal.     Palpations: There is no hepatomegaly, splenomegaly or mass.     Tenderness: There is no abdominal tenderness. There is no guarding.     Hernia: There is no hernia in the right inguinal area or left inguinal area.  Genitourinary:    Pubic Area: No rash.      Penis: Circumcised. No discharge, swelling or lesions.      Scrotum/Testes: Normal.        Right: Mass, tenderness or swelling not present.        Left: Mass, tenderness or swelling not present.     Epididymis:     Right: Normal. Not inflamed or enlarged.     Left: Normal. Not inflamed or enlarged.     Prostate: Normal. Not enlarged, not tender and no nodules present.     Rectum: Normal. Guaiac result negative. No mass, tenderness, anal fissure, external hemorrhoid or internal hemorrhoid. Normal anal tone.  Musculoskeletal: Normal range of motion.        General: No swelling.     Right lower leg: No edema.     Left lower leg: No edema.   Lymphadenopathy:     Cervical: No cervical adenopathy.     Lower Body: No right inguinal adenopathy. No left inguinal adenopathy.  Skin:    General: Skin is warm and dry.  Neurological:     General: No focal deficit present.     Mental Status: Gregory [name] is oriented to person, place, and time. Mental status is at baseline.     Sensory:  Sensory deficit present.     Motor: No weakness.     Coordination: Coordination normal.     Gait: Gait normal.     Deep Tendon Reflexes: Reflexes normal.  Psychiatric:        Mood and Affect: Mood normal.        Behavior: Behavior normal.        Thought Content: Thought content normal.        Judgment: Judgment normal.     Lab Results  Component Value Date   WBC 4.7 07/29/2018   HGB 14.2 07/29/2018   HCT 42.2 07/29/2018   PLT 187.0 07/29/2018   GLUCOSE 281 (H) 07/29/2018   CHOL 183 07/29/2018   TRIG 228.0 (H) 07/29/2018   HDL 47.00 07/29/2018   LDLDIRECT 115.0 07/29/2018   LDLCALC 135 02/26/2018   ALT 31 07/29/2018   AST 28 07/29/2018   NA 135 07/29/2018   K 4.4 07/29/2018   CL 100 07/29/2018   CREATININE 1.11 07/29/2018   BUN 25 (H) 07/29/2018   CO2 28 07/29/2018   TSH 2.09 07/29/2018   PSA 1.1 07/29/2018   INR 1.20 (L) 04/28/2017   HGBA1C 8.6 (H) 07/29/2018   MICROALBUR 1.4 02/26/2018    No results found.  Assessment & Plan:   Gregory [name] was seen today for annual exam, anemia, hypertension and diabetes.  Diagnoses and all orders for this visit:  Need for Tdap vaccination -     Tdap vaccine greater than or equal to 7yo IM  Need for pneumococcal vaccination -     Pneumococcal polysaccharide vaccine 23-valent greater than or equal to 2yo subcutaneous/IM  Essential hypertension- His blood pressure is well controlled.  Electrolytes and renal function are normal. -     Comprehensive metabolic panel; Future -     TSH; Future -     VITAMIN D 25 Hydroxy (Vit-D Deficiency, Fractures); Future  Manifestations of thiamine deficiency- I  will monitor his thiamine level. -     Vitamin B1; Future  Other dietary vitamin B12 deficiency anemia -     CBC with Differential/Platelet; Future -     Vitamin B12; Future -     Folate; Future  PSA elevation- His PSA is normal now. -     PSA, total and free; Future  Routine health maintenance- Exam completed, labs reviewed, vaccines reviewed and updated, screening for colon cancer is up-to-date, patient education material was given. -     Lipid panel; Future -     HIV Antibody (routine testing w rflx); Future  Pure hyperglyceridemia- His triglycerides are mildly elevated but do not require pharmacological intervention.  Gregory [name] agrees to work on his lifestyle modifications.  IDDM (insulin dependent diabetes mellitus) ([name])- His blood sugar is not adequately well controlled.  Gregory [name] will follow-up with his endocrinologist. -     Comprehensive metabolic panel; Future -     Hemoglobin A1c; Future -     HM Diabetes Foot Exam -     Ambulatory referral to Endocrinology  Other iron deficiency anemia- His H&H and iron levels are normal now. -     IBC panel; Future -     Ferritin; Future  Vitamin B12 deficiency neuropathy ([place])- Gregory [name] will start parenteral B12 replacement therapy.  Vitamin D deficiency disease -     Cholecalciferol 1.25 MG (50000 UT) capsule; Take 1 capsule (50,000 Units total) by mouth once a week.  Hyperlipidemia LDL goal <70- Gregory [name] has not achieved his LDL goal.  I will  increase the dose of atorvastatin. -     atorvastatin (LIPITOR) 20 MG tablet; Take 1 tablet (20 mg total) by mouth daily.   I have discontinued Seiya Silsby. Federer's Insulin Glargine, FerraPlus 90, sennosides-docusate sodium, predniSONE, Tocilizumab, and atorvastatin. I am also having him start on Cholecalciferol and atorvastatin. Additionally, I am having him maintain his cetirizine, blood glucose meter kit and supplies, insulin aspart, thiamine, levothyroxine, sacubitril-valsartan, PARoxetine, and carvedilol.  Meds  ordered this encounter  Medications  . Cholecalciferol 1.25 MG (50000 UT) capsule    Sig: Take 1 capsule (50,000 Units total) by mouth once a week.    Dispense:  12 capsule    Refill:  0  . atorvastatin (LIPITOR) 20 MG tablet    Sig: Take 1 tablet (20 mg total) by mouth daily.    Dispense:  90 tablet    Refill:  1     Follow-up: Return in about 6 months (around 01/29/2019).  Scarlette Calico, MD

## 2018-07-29 NOTE — Patient Instructions (Signed)

## 2018-07-30 ENCOUNTER — Encounter: Payer: Self-pay | Admitting: Internal Medicine

## 2018-07-30 ENCOUNTER — Ambulatory Visit (INDEPENDENT_AMBULATORY_CARE_PROVIDER_SITE_OTHER): Payer: BLUE CROSS/BLUE SHIELD

## 2018-07-30 DIAGNOSIS — E538 Deficiency of other specified B group vitamins: Secondary | ICD-10-CM

## 2018-07-30 MED ORDER — CYANOCOBALAMIN 1000 MCG/ML IJ SOLN
1000.0000 ug | Freq: Once | INTRAMUSCULAR | Status: AC
  Administered 2018-07-30: 1000 ug via INTRAMUSCULAR

## 2018-07-30 NOTE — Progress Notes (Signed)
I have reviewed and agree.

## 2018-08-02 ENCOUNTER — Encounter: Payer: Self-pay | Admitting: Internal Medicine

## 2018-08-02 LAB — VITAMIN B1: Vitamin B1 (Thiamine): 55 nmol/L — ABNORMAL HIGH (ref 8–30)

## 2018-08-02 LAB — PSA, TOTAL AND FREE
PSA, % Free: 9 % (calc) — ABNORMAL LOW (ref >25)
PSA, Free: 0.1 ng/mL
PSA, Total: 1.1 ng/mL (ref <=4.0)

## 2018-08-02 LAB — HIV ANTIBODY (ROUTINE TESTING W REFLEX): HIV 1&2 Ab, 4th Generation: NONREACTIVE

## 2018-08-03 ENCOUNTER — Other Ambulatory Visit: Payer: Self-pay | Admitting: Internal Medicine

## 2018-08-03 DIAGNOSIS — E559 Vitamin D deficiency, unspecified: Secondary | ICD-10-CM

## 2018-08-03 MED ORDER — CHOLECALCIFEROL 50 MCG (2000 UT) PO TABS: 2.0000 | ORAL_TABLET | Freq: Every day | ORAL | 1 refills | Status: DC

## 2018-08-04 ENCOUNTER — Other Ambulatory Visit (HOSPITAL_COMMUNITY): Payer: Self-pay | Admitting: Internal Medicine

## 2018-08-10 ENCOUNTER — Encounter: Payer: Self-pay | Admitting: *Deleted

## 2018-08-10 ENCOUNTER — Other Ambulatory Visit: Payer: Self-pay

## 2018-08-10 ENCOUNTER — Ambulatory Visit (INDEPENDENT_AMBULATORY_CARE_PROVIDER_SITE_OTHER): Payer: BLUE CROSS/BLUE SHIELD | Admitting: *Deleted

## 2018-08-10 DIAGNOSIS — E538 Deficiency of other specified B group vitamins: Secondary | ICD-10-CM | POA: Diagnosis not present

## 2018-08-10 MED ORDER — CYANOCOBALAMIN 1000 MCG/ML IJ SOLN
1000.0000 ug | Freq: Once | INTRAMUSCULAR | Status: AC
  Administered 2018-08-10: 1000 ug via INTRAMUSCULAR

## 2018-08-10 NOTE — Progress Notes (Signed)
I have reviewed and agree.

## 2018-08-14 ENCOUNTER — Encounter (INDEPENDENT_AMBULATORY_CARE_PROVIDER_SITE_OTHER): Payer: BLUE CROSS/BLUE SHIELD | Admitting: Ophthalmology

## 2018-08-17 ENCOUNTER — Telehealth: Payer: Self-pay

## 2018-08-17 NOTE — Telephone Encounter (Signed)
Patient advised he can wait to come in for b12 injection, not sure if any exposure from COVID-19 has been presented in our office, patient wants to come in anyway----patient advised we will be screening his temperature upon arrival, any fever will warrant no visit with [place] in the office at that time, patient repeated back for understanding

## 2018-08-18 ENCOUNTER — Ambulatory Visit (INDEPENDENT_AMBULATORY_CARE_PROVIDER_SITE_OTHER): Payer: BLUE CROSS/BLUE SHIELD

## 2018-08-18 ENCOUNTER — Other Ambulatory Visit: Payer: Self-pay

## 2018-08-18 DIAGNOSIS — E538 Deficiency of other specified B group vitamins: Secondary | ICD-10-CM

## 2018-08-18 MED ORDER — CYANOCOBALAMIN 1000 MCG/ML IJ SOLN
1000.0000 ug | Freq: Once | INTRAMUSCULAR | Status: AC
  Administered 2018-08-18: 1000 ug via INTRAMUSCULAR

## 2018-08-18 NOTE — Progress Notes (Signed)
I have reviewed and agree.

## 2018-08-26 ENCOUNTER — Other Ambulatory Visit (HOSPITAL_COMMUNITY): Payer: Self-pay | Admitting: Internal Medicine

## 2018-09-14 ENCOUNTER — Encounter: Payer: Self-pay | Admitting: Internal Medicine

## 2018-09-14 ENCOUNTER — Other Ambulatory Visit: Payer: Self-pay

## 2018-09-14 ENCOUNTER — Ambulatory Visit (INDEPENDENT_AMBULATORY_CARE_PROVIDER_SITE_OTHER): Payer: BLUE CROSS/BLUE SHIELD | Admitting: Internal Medicine

## 2018-09-14 DIAGNOSIS — J0100 Acute maxillary sinusitis, unspecified: Secondary | ICD-10-CM

## 2018-09-14 DIAGNOSIS — J301 Allergic rhinitis due to pollen: Secondary | ICD-10-CM

## 2018-09-14 MED ORDER — TRIAMCINOLONE ACETONIDE 55 MCG/ACT NA AERO: 2.0000 | INHALATION_SPRAY | Freq: Every day | NASAL | 3 refills | Status: DC

## 2018-09-14 MED ORDER — AMOXICILLIN-POT CLAVULANATE 875-125 MG PO TABS: 1.0000 | ORAL_TABLET | Freq: Two times a day (BID) | ORAL | 0 refills | Status: AC

## 2018-09-14 MED ORDER — METHYLPREDNISOLONE 4 MG PO TBPK: ORAL_TABLET | ORAL | 0 refills | Status: AC

## 2018-09-14 NOTE — Progress Notes (Signed)
Virtual Visit via Video Note  I connected with Gregory [name] on 09/14/18 at  1:30 PM EDT by a video enabled telemedicine application and verified that I am speaking with the correct person using two identifiers.   I discussed the limitations of evaluation and management by telemedicine and the availability of in person appointments. The patient expressed understanding and agreed to proceed.  History of Present Illness: He checked in for a virtual visit.  He was not willing to come in because of the COVID-19 pandemic.  He complains of a 10-day history of nasal congestion, postnasal drip, nasal phlegm and cough that are productive of thick green phlegm, wheezing, and facial pain but he denies fever, chills, night sweats, or hemoptysis.  He is not getting much symptom relief with Zyrtec.    Observations/Objective: He was in no acute distress.  His breathing pattern and respirations were normal.  His face was not swollen.  He was calm, cooperative, and appropriate.  Lab Results  Component Value Date   WBC 4.7 07/29/2018   HGB 14.2 07/29/2018   HCT 42.2 07/29/2018   PLT 187.0 07/29/2018   GLUCOSE 281 (H) 07/29/2018   CHOL 183 07/29/2018   TRIG 228.0 (H) 07/29/2018   HDL 47.00 07/29/2018   LDLDIRECT 115.0 07/29/2018   LDLCALC 135 02/26/2018   ALT 31 07/29/2018   AST 28 07/29/2018   NA 135 07/29/2018   K 4.4 07/29/2018   CL 100 07/29/2018   CREATININE 1.11 07/29/2018   BUN 25 (H) 07/29/2018   CO2 28 07/29/2018   TSH 2.09 07/29/2018   PSA 1.1 07/29/2018   INR 1.20 (L) 04/28/2017   HGBA1C 8.6 (H) 07/29/2018   MICROALBUR 1.4 02/26/2018     Assessment and Plan: He has allergic rhinitis that is not well controlled with Zyrtec and acute bacterial sinusitis.  Will treat the bacterial infection with Augmentin.  I have asked him to continue taking Zyrtec but will also add a 6-day course of systemic steroids and a steroid nasal spray.  Prescriptions were sent to his [facility].   Follow Up Instructions: He agrees to let me know if he develops any new or worsening symptoms.  He agrees to comply with the above recommendations.  He agrees to continue taking Zyrtec.    I discussed the assessment and treatment plan with the patient. The patient was provided an opportunity to ask questions and all were answered. The patient agreed with the plan and demonstrated an understanding of the instructions.   The patient was advised to call back or seek an in-person evaluation if the symptoms worsen or if the condition fails to improve as anticipated.  I provided 25 minutes of non-face-to-face time during this encounter.   Scarlette Calico, MD

## 2018-10-16 ENCOUNTER — Ambulatory Visit (HOSPITAL_COMMUNITY)
Admission: RE | Admit: 2018-10-16 | Discharge: 2018-10-16 | Disposition: A | Discharge status: Home / Self Care | Payer: BLUE CROSS/BLUE SHIELD | Source: Ambulatory Visit | Attending: Internal Medicine | Admitting: Internal Medicine

## 2018-10-16 ENCOUNTER — Other Ambulatory Visit: Payer: Self-pay

## 2018-10-16 DIAGNOSIS — I5022 Chronic systolic (congestive) heart failure: Secondary | ICD-10-CM

## 2018-10-16 DIAGNOSIS — I509 Heart failure, unspecified: Secondary | ICD-10-CM | POA: Insufficient documentation

## 2018-10-16 DIAGNOSIS — E785 Hyperlipidemia, unspecified: Secondary | ICD-10-CM | POA: Insufficient documentation

## 2018-10-16 DIAGNOSIS — I110 Hypertensive heart disease with heart failure: Secondary | ICD-10-CM | POA: Insufficient documentation

## 2018-10-16 NOTE — Progress Notes (Signed)
  Echocardiogram 2D Echocardiogram has been performed.  Burnett Kanaris 10/16/2018, 10:18 AM

## 2018-11-06 DIAGNOSIS — Z23 Encounter for immunization: Secondary | ICD-10-CM | POA: Diagnosis not present

## 2018-11-06 DIAGNOSIS — Z794 Long term (current) use of insulin: Secondary | ICD-10-CM | POA: Diagnosis not present

## 2018-11-06 DIAGNOSIS — E039 Hypothyroidism, unspecified: Secondary | ICD-10-CM | POA: Diagnosis not present

## 2018-11-06 DIAGNOSIS — E109 Type 1 diabetes mellitus without complications: Secondary | ICD-10-CM | POA: Diagnosis not present

## 2018-11-06 DIAGNOSIS — E1165 Type 2 diabetes mellitus with hyperglycemia: Secondary | ICD-10-CM | POA: Diagnosis not present

## 2018-11-06 LAB — HEMOGLOBIN A1C: Hemoglobin A1C: 9.2

## 2018-11-20 ENCOUNTER — Other Ambulatory Visit: Payer: Self-pay

## 2018-11-20 ENCOUNTER — Encounter (INDEPENDENT_AMBULATORY_CARE_PROVIDER_SITE_OTHER): Payer: BC Managed Care – PPO | Admitting: Ophthalmology

## 2018-11-20 DIAGNOSIS — H43813 Vitreous degeneration, bilateral: Secondary | ICD-10-CM | POA: Diagnosis not present

## 2018-11-20 DIAGNOSIS — H353211 Exudative age-related macular degeneration, right eye, with active choroidal neovascularization: Secondary | ICD-10-CM

## 2018-11-20 DIAGNOSIS — H35033 Hypertensive retinopathy, bilateral: Secondary | ICD-10-CM

## 2018-11-20 DIAGNOSIS — I10 Essential (primary) hypertension: Secondary | ICD-10-CM | POA: Diagnosis not present

## 2018-11-20 DIAGNOSIS — H2513 Age-related nuclear cataract, bilateral: Secondary | ICD-10-CM

## 2018-11-30 ENCOUNTER — Telehealth: Payer: Self-pay

## 2018-11-30 NOTE — Telephone Encounter (Signed)
Received message that the patient has been receiving care at [facility] but due to the current state of COVID his MD [place] recommends that he receives care here at [facility] for the time being. He requested email so he can send the required follow up appointments with Dr. Alen Blew. Contacted patient and left message requesting that he send any information regarding follow up appt through Mychart as email is not secure. Also that Dr. Alen Blew is out for the week so once the information is received via Mychart will follow up once Dr. Alen Blew returns to the office next week.

## 2018-12-02 ENCOUNTER — Telehealth: Payer: Self-pay

## 2018-12-02 NOTE — Telephone Encounter (Signed)
Received call from patient stating that he is cancer free but still receives his follow up care at [facility] both he and his MD feel it is safer for him to follow up with Dr. Alen Blew at the [facility]. He stated that he has received care from Dr. Alen Blew in the past. Requested that he have [facility] fax most recent records and results along with requested follow up to this office and once Dr. Alen Blew returns to the office he will review and we will then be back in contact with the plan. Patient verbalized understanding and stated that he will contact [facility].

## 2018-12-07 ENCOUNTER — Other Ambulatory Visit: Payer: Self-pay | Admitting: Oncology

## 2018-12-07 ENCOUNTER — Telehealth: Payer: Self-pay

## 2018-12-07 DIAGNOSIS — C649 Malignant neoplasm of unspecified kidney, except renal pelvis: Secondary | ICD-10-CM

## 2018-12-07 NOTE — Telephone Encounter (Signed)
Patient aware that scans have been ordered and are pending authorization. He has been provided the contact for central scheduling for follow up. He is also aware that scheduling will contact him to set up appt for lab and MD follow up. He has requested that the scan be sent via CD to [facility] and records to be faxed. He is aware that he will need to sign a medical release form at his next appt. prior to sending the medical documents. Patient had no other questions or concerns.

## 2018-12-08 ENCOUNTER — Telehealth: Payer: Self-pay | Admitting: Oncology

## 2018-12-08 NOTE — Telephone Encounter (Signed)
Scheduled appt per 7/13 sch message - pt aware of appt date and time . Central radiology to contact patient with scan appt.

## 2018-12-10 DIAGNOSIS — D1801 Hemangioma of skin and subcutaneous tissue: Secondary | ICD-10-CM | POA: Diagnosis not present

## 2018-12-10 DIAGNOSIS — L821 Other seborrheic keratosis: Secondary | ICD-10-CM | POA: Diagnosis not present

## 2018-12-10 DIAGNOSIS — D2239 Melanocytic nevi of other parts of face: Secondary | ICD-10-CM | POA: Diagnosis not present

## 2018-12-10 DIAGNOSIS — D225 Melanocytic nevi of trunk: Secondary | ICD-10-CM | POA: Diagnosis not present

## 2018-12-10 DIAGNOSIS — B079 Viral wart, unspecified: Secondary | ICD-10-CM | POA: Diagnosis not present

## 2018-12-10 DIAGNOSIS — D485 Neoplasm of uncertain behavior of skin: Secondary | ICD-10-CM | POA: Diagnosis not present

## 2018-12-15 ENCOUNTER — Ambulatory Visit (HOSPITAL_COMMUNITY)
Admission: RE | Admit: 2018-12-15 | Discharge: 2018-12-15 | Disposition: A | Discharge status: Home / Self Care | Payer: BC Managed Care – PPO | Source: Ambulatory Visit | Attending: Oncology | Admitting: Oncology

## 2018-12-15 ENCOUNTER — Other Ambulatory Visit: Payer: Self-pay

## 2018-12-15 ENCOUNTER — Encounter (HOSPITAL_COMMUNITY): Payer: Self-pay

## 2018-12-15 ENCOUNTER — Inpatient Hospital Stay: Payer: BC Managed Care – PPO | Attending: Oncology

## 2018-12-15 DIAGNOSIS — Z905 Acquired absence of kidney: Secondary | ICD-10-CM | POA: Diagnosis not present

## 2018-12-15 DIAGNOSIS — C7802 Secondary malignant neoplasm of left lung: Secondary | ICD-10-CM | POA: Diagnosis not present

## 2018-12-15 DIAGNOSIS — M1990 Unspecified osteoarthritis, unspecified site: Secondary | ICD-10-CM | POA: Insufficient documentation

## 2018-12-15 DIAGNOSIS — D649 Anemia, unspecified: Secondary | ICD-10-CM | POA: Insufficient documentation

## 2018-12-15 DIAGNOSIS — Z79899 Other long term (current) drug therapy: Secondary | ICD-10-CM | POA: Insufficient documentation

## 2018-12-15 DIAGNOSIS — C649 Malignant neoplasm of unspecified kidney, except renal pelvis: Secondary | ICD-10-CM | POA: Insufficient documentation

## 2018-12-15 DIAGNOSIS — Z794 Long term (current) use of insulin: Secondary | ICD-10-CM | POA: Insufficient documentation

## 2018-12-15 DIAGNOSIS — I700 Atherosclerosis of aorta: Secondary | ICD-10-CM | POA: Diagnosis not present

## 2018-12-15 DIAGNOSIS — M129 Arthropathy, unspecified: Secondary | ICD-10-CM | POA: Insufficient documentation

## 2018-12-15 DIAGNOSIS — R918 Other nonspecific abnormal finding of lung field: Secondary | ICD-10-CM | POA: Diagnosis not present

## 2018-12-15 DIAGNOSIS — I429 Cardiomyopathy, unspecified: Secondary | ICD-10-CM | POA: Diagnosis not present

## 2018-12-15 DIAGNOSIS — E119 Type 2 diabetes mellitus without complications: Secondary | ICD-10-CM | POA: Insufficient documentation

## 2018-12-15 LAB — CBC WITH DIFFERENTIAL (CANCER CENTER ONLY)
Abs Immature Granulocytes: 0.01 10*3/uL (ref 0.00–0.07)
Basophils Absolute: 0 10*3/uL (ref 0.0–0.1)
Basophils Relative: 0 %
Eosinophils Absolute: 0.1 10*3/uL (ref 0.0–0.5)
Eosinophils Relative: 2 %
HCT: 42.2 % (ref 39.0–52.0)
Hemoglobin: 13.8 g/dL (ref 13.0–17.0)
Immature Granulocytes: 0 %
Lymphocytes Relative: 21 %
Lymphs Abs: 1.1 10*3/uL (ref 0.7–4.0)
MCH: 29.3 pg (ref 26.0–34.0)
MCHC: 32.7 g/dL (ref 30.0–36.0)
MCV: 89.6 fL (ref 80.0–100.0)
Monocytes Absolute: 0.3 10*3/uL (ref 0.1–1.0)
Monocytes Relative: 6 %
Neutro Abs: 3.6 10*3/uL (ref 1.7–7.7)
Neutrophils Relative %: 71 %
Platelet Count: 186 10*3/uL (ref 150–400)
RBC: 4.71 MIL/uL (ref 4.22–5.81)
RDW: 11.9 % (ref 11.5–15.5)
WBC Count: 5.2 10*3/uL (ref 4.0–10.5)
nRBC: 0 % (ref 0.0–0.2)

## 2018-12-15 LAB — CMP (CANCER CENTER ONLY)
ALT: 27 U/L (ref 0–44)
AST: 30 U/L (ref 15–41)
Albumin: 3.7 g/dL (ref 3.5–5.0)
Alkaline Phosphatase: 88 U/L (ref 38–126)
Anion gap: 12 (ref 5–15)
BUN: 21 mg/dL — ABNORMAL HIGH (ref 6–20)
CO2: 26 mmol/L (ref 22–32)
Calcium: 8.4 mg/dL — ABNORMAL LOW (ref 8.9–10.3)
Chloride: 100 mmol/L (ref 98–111)
Creatinine: 1.34 mg/dL — ABNORMAL HIGH (ref 0.61–1.24)
GFR, Est AFR Am: >60 mL/min (ref >60)
GFR, Estimated: 58 mL/min — ABNORMAL LOW (ref >60)
Glucose, Bld: 308 mg/dL — ABNORMAL HIGH (ref 70–99)
Potassium: 5.2 mmol/L — ABNORMAL HIGH (ref 3.5–5.1)
Sodium: 138 mmol/L (ref 135–145)
Total Bilirubin: 0.6 mg/dL (ref 0.3–1.2)
Total Protein: 6.9 g/dL (ref 6.5–8.1)

## 2018-12-15 MED ORDER — SODIUM CHLORIDE (PF) 0.9 % IJ SOLN
INTRAMUSCULAR | Status: AC
  Filled 2018-12-15: qty 50

## 2018-12-15 MED ORDER — IOHEXOL 300 MG/ML  SOLN
100.0000 mL | Freq: Once | INTRAMUSCULAR | Status: AC | PRN
  Administered 2018-12-15: 100 mL via INTRAVENOUS

## 2018-12-21 ENCOUNTER — Other Ambulatory Visit: Payer: Self-pay

## 2018-12-21 ENCOUNTER — Inpatient Hospital Stay (HOSPITAL_BASED_OUTPATIENT_CLINIC_OR_DEPARTMENT_OTHER): Payer: BC Managed Care – PPO | Admitting: Oncology

## 2018-12-21 VITALS — BP 137/90 | HR 73 | Temp 98.2°F | Resp 18 | Ht 74.0 in | Wt 283.1 lb

## 2018-12-21 DIAGNOSIS — D649 Anemia, unspecified: Secondary | ICD-10-CM

## 2018-12-21 DIAGNOSIS — M1990 Unspecified osteoarthritis, unspecified site: Secondary | ICD-10-CM | POA: Diagnosis not present

## 2018-12-21 DIAGNOSIS — I700 Atherosclerosis of aorta: Secondary | ICD-10-CM | POA: Diagnosis not present

## 2018-12-21 DIAGNOSIS — I429 Cardiomyopathy, unspecified: Secondary | ICD-10-CM

## 2018-12-21 DIAGNOSIS — Z79899 Other long term (current) drug therapy: Secondary | ICD-10-CM | POA: Diagnosis not present

## 2018-12-21 DIAGNOSIS — E119 Type 2 diabetes mellitus without complications: Secondary | ICD-10-CM | POA: Diagnosis not present

## 2018-12-21 DIAGNOSIS — C7802 Secondary malignant neoplasm of left lung: Secondary | ICD-10-CM | POA: Diagnosis not present

## 2018-12-21 DIAGNOSIS — Z794 Long term (current) use of insulin: Secondary | ICD-10-CM | POA: Diagnosis not present

## 2018-12-21 DIAGNOSIS — M129 Arthropathy, unspecified: Secondary | ICD-10-CM

## 2018-12-21 DIAGNOSIS — C649 Malignant neoplasm of unspecified kidney, except renal pelvis: Secondary | ICD-10-CM | POA: Diagnosis not present

## 2018-12-21 NOTE — Progress Notes (Signed)
Hematology and Oncology Follow Up Visit  Gregory [name] [id number] August 09, 1960 58 y.o. 12/21/2018 10:18 AM Gregory [name], MDJones, Gregory Right, MD   Principle Diagnosis: 58 year old man with stage IV renal cell cancer diagnosed in August 2017 with pulmonary metastasis.   Prior Therapy: He is status post radical nephrectomy done on 02/07/2016 at [facility]. 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 The measurements of the tumor is 9.9 x 9.0 x 9.0 cm. He did have small pulmonary nodules and a repeat scan on 03/25/2016 showed a 0.6 mm left lower lobe nodule previously measuring 3 mm. There is a 1 cm left lower lobe nodule previously was 0.5. Nivolumab 240 mg every 2 weeks started on 06/07/2016. This is his first-line therapy chosen because of cardiomyopathy. He is here for evaluation before the next treatment. Therapy discontinued on April 25, 2017 for potential complications.   He is status post surgical resection of pulmonary nodules with a wedge resection of his left lung completed in March 2019.   Current therapy: Active surveillance.   Interim History: [name] returns today for a repeat evaluation.  He is a pleasant gentleman receives his regular cancer care at [facility] with a history outlined above.  He has to reestablish care as of his inability to travel to [place].  Since his last visit, he reports feeling well without any major complaints.  He continues to use insulin for diabetes but no other complaints.  His arthritis is completely resolved at this time.  He remains active and continues to attend activities of daily living including working full-time.  He denied any alteration mental status, neuropathy, confusion or dizziness.  Denies any headaches or lethargy.  Denies any night sweats, weight loss or changes in appetite.  Denied orthopnea, dyspnea on exertion or chest discomfort.  Denies shortness of breath, difficulty breathing  hemoptysis or cough.  Denies any abdominal distention, nausea, early satiety or dyspepsia.  Denies any hematuria, frequency, dysuria or nocturia.  Denies any skin irritation, dryness or rash.  Denies any ecchymosis or petechiae.  Denies any lymphadenopathy or clotting.  Denies any heat or cold intolerance.  Denies any anxiety or depression.  Remaining review of system is negative.      Medications: Updated today on review. Current Outpatient Medications  Medication Sig Dispense Refill  . atorvastatin (LIPITOR) 20 MG tablet Take 1 tablet (20 mg total) by mouth daily. 90 tablet 1  . blood glucose meter kit and supplies KIT Dispense based on patient and insurance preference. Use up to four times daily as directed. (FOR ICD-9 250.00, 250.01). 1 each 0  . carvedilol (COREG) 6.25 MG tablet TAKE 1 TABLET BY MOUTH TWICE DAILY 180 tablet 3  . cetirizine (ZYRTEC) 10 MG tablet Take 10 mg by mouth daily.    . Cholecalciferol 50 MCG (2000 UT) TABS Take 2 tablets (4,000 Units total) by mouth daily. 180 tablet 1  . ENTRESTO 49-51 MG TAKE 1 TABLET BY MOUTH TWICE DAILY 60 tablet 3  . insulin aspart (NOVOLOG FLEXPEN) 100 UNIT/ML FlexPen Add to meal coverage Sliding scale CBG 70 - 120: 0 units CBG 121 - 150: 1 unit,  CBG 151 - 200: 2 units,  CBG 201 - 250: 3 units,  CBG 251 - 300: 5 units,  CBG 301 - 350: 7 units,  CBG 351 - 400: 9 units   CBG > 400: 9 units and notify your MD (Patient taking differently: Inject 7-18 Units  into the skin 3 (three) times daily with meals. Per sliding scale Add to meal coverage Sliding scale CBG 70 - 120: 0 units CBG 121 - 150: 1 unit,  CBG 151 - 200: 2 units,  CBG 201 - 250: 3 units,  CBG 251 - 300: 5 units,  CBG 301 - 350: 7 units,  CBG 351 - 400: 9 units   CBG > 400: 9 units and notify your MD) 15 mL 11  . levothyroxine (SYNTHROID, LEVOTHROID) 25 MCG tablet TAKE 1 TABLET ONCE DAILY BEFORE BREAKFAST 30 tablet 0  . PARoxetine (PAXIL) 30 MG tablet Take 1 tablet (30 mg total) by mouth  daily. 90 tablet 1  . thiamine (VITAMIN B-1) 100 MG tablet Take 1 tablet (100 mg total) by mouth daily. 90 tablet 1  . triamcinolone (NASACORT) 55 MCG/ACT AERO nasal inhaler Place 2 sprays into the nose daily. 32.4 mL 3   No current facility-administered medications for this visit.      Allergies: No Known Allergies  Past Medical History, Surgical history, Social history, and Family History remains without any changes.   Physical Exam:  Blood pressure 137/90, pulse 73, temperature 98.2 F (36.8 C), temperature source Temporal, resp. rate 18, height '6\' 2"'  (1.88 m), weight 283 lb 1.6 oz (128.4 kg), SpO2 98 %.   ECOG: 1    General appearance: Comfortable appearing without any discomfort Head: Normocephalic without any trauma Oropharynx: Mucous membranes are moist and pink without any thrush or ulcers. Eyes: Pupils are equal and round reactive to light. Lymph nodes: No cervical, supraclavicular, inguinal or axillary lymphadenopathy.   Heart:regular rate and rhythm.  S1 and S2 without leg edema. Lung: Clear without any rhonchi or wheezes.  No dullness to percussion. Abdomin: Soft, nontender, nondistended with good bowel sounds.  No hepatosplenomegaly. Musculoskeletal: No joint deformity or effusion.  Full range of motion noted. Neurological: No deficits noted on motor, sensory and deep tendon reflex exam. Skin: No petechial rash or dryness.  Appeared moist.    Lab Results: Lab Results  Component Value Date   WBC 5.2 12/15/2018   HGB 13.8 12/15/2018   HCT 42.2 12/15/2018   MCV 89.6 12/15/2018   PLT 186 12/15/2018     Chemistry      Component Value Date/Time   NA 138 12/15/2018 0743   NA 136 (A) 02/26/2018   NA 139 04/28/2017 1330   K 5.2 (H) 12/15/2018 0743   K 4.2 04/28/2017 1330   CL 100 12/15/2018 0743   CO2 26 12/15/2018 0743   CO2 30 (H) 04/28/2017 1330   BUN 21 (H) 12/15/2018 0743   BUN 24 (A) 02/26/2018   BUN 15.5 04/28/2017 1330   CREATININE 1.34 (H)  12/15/2018 0743   CREATININE 1.0 04/28/2017 1330   GLU 222 02/26/2018      Component Value Date/Time   CALCIUM 8.4 (L) 12/15/2018 0743   CALCIUM 9.7 04/28/2017 1330   ALKPHOS 88 12/15/2018 0743   ALKPHOS 73 04/28/2017 1330   AST 30 12/15/2018 0743   AST 12 04/28/2017 1330   ALT 27 12/15/2018 0743   ALT 10 04/28/2017 1330   BILITOT 0.6 12/15/2018 0743   BILITOT 0.61 04/28/2017 1330     EXAM: CT CHEST WITH CONTRAST  CT ABDOMEN AND PELVIS WITH AND WITHOUT CONTRAST  TECHNIQUE: Multidetector CT imaging of the chest was performed during intravenous contrast administration. Multidetector CT imaging of the abdomen and pelvis was performed following the standard protocol before and during bolus administration  of intravenous contrast.  CONTRAST:  166m OMNIPAQUE IOHEXOL 300 MG/ML  SOLN  COMPARISON:  None.  FINDINGS: CT CHEST FINDINGS  Cardiovascular: The heart size is normal. No substantial pericardial effusion. Coronary artery calcification is evident. Atherosclerotic calcification is noted in the wall of the thoracic aorta.  Mediastinum/Nodes: No mediastinal lymphadenopathy. There is no hilar lymphadenopathy. The esophagus has normal imaging features. There is no axillary lymphadenopathy.  Lungs/Pleura: 5 mm [name] middle lobe perifissural pulmonary nodule identified on 82/15. Tiny polypoid lesion identified on the wall of the bronchus intermedius (71/15). 2 mm [name] middle lobe nodule visible on 96/15. 6 mm [name] upper lobe perifissural nodule visible on 50/15. Surgical scarring noted left lower lobe. No pleural effusion.  Musculoskeletal: Left shoulder replacement. No worrisome lytic or sclerotic osseous abnormality.  CT ABDOMEN AND PELVIS FINDINGS  Hepatobiliary: No suspicious focal abnormality within the liver parenchyma. There is no evidence for gallstones, gallbladder wall thickening, or pericholecystic fluid. No intrahepatic or extrahepatic biliary  dilation.  Pancreas: No focal mass lesion. No dilatation of the main duct. No intraparenchymal cyst. No peripancreatic edema.  Spleen: No splenomegaly. No focal mass lesion.  Adrenals/Urinary Tract: No adrenal nodule or mass. [name] kidney surgically absent. Left kidney unremarkable. No left hydroureter. The urinary bladder appears normal for the degree of distention.  Stomach/Bowel: Stomach is unremarkable. No gastric wall thickening. No evidence of outlet obstruction. Duodenum is normally positioned as is the ligament of Treitz. No small bowel wall thickening. No small bowel dilatation. The terminal ileum is normal. No gross colonic mass. No colonic wall thickening.  Vascular/Lymphatic: There is abdominal aortic atherosclerosis without aneurysm. There is no gastrohepatic or hepatoduodenal ligament lymphadenopathy. No intraperitoneal or retroperitoneal lymphadenopathy. No pelvic sidewall lymphadenopathy.  Reproductive: Prostate gland is enlarged.  Other: No intraperitoneal free fluid.  Musculoskeletal: No worrisome lytic or sclerotic osseous abnormality.  IMPRESSION: 1. Status post [name] nephrectomy. No evidence for metastatic disease in the abdomen/pelvis. 2. Bilateral tiny pulmonary nodules. Given perifissural location these are probably subpleural lymph nodes but attention on follow-up recommended. 3. Tiny 3-4 mm polypoid lesion seen on the wall of the bronchus intermedius. Follow-up CT in 3 months recommended to ensure stability. 4.  Aortic Atherosclerois (ICD10-170.0)    Impression and Plan: 58year old with:   1.  Stage IV renal cell carcinoma with pulmonary metastasis noted in 2017.  He is status post therapy outlined above and currently on active surveillance.  Staging work-up obtained on 12/15/2018 was personally reviewed discussed with the patient.  He is no clear-cut evidence of metastatic disease in the abdomen and pelvis with small bilateral  small nodules in the peri-fissural location that warrant follow-up and evaluation but not clear-cut for metastatic disease.  The natural course of this disease was reviewed again salvage therapies were outlined.  His include oral targeted therapy other agents.  At this time he will continue on active surveillance and therapy guided by his physicians at [facility]  Repeat imaging studies will be done in 3 to 6 months depending on his ability to travel to [place]    2.  Anemia: Resolved at this time his hemoglobin is normal.    3. Cardiomyopathy: He has follow-up with cardiology regularly.  4.  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  5.  Follow-up: I am happy to help in his care in the future as needed.  [facility] and like  to continue to do so.  25  minutes was spent with the patient face-to-face today.  More than 50% of time was spent on reviewing his disease status, laboratory review, imaging studies review and answering questions regarding future plan of care.    Zola Button, MD 7/27/202010:18 AM

## 2018-12-24 ENCOUNTER — Other Ambulatory Visit: Payer: Self-pay | Admitting: Internal Medicine

## 2018-12-24 DIAGNOSIS — F418 Other specified anxiety disorders: Secondary | ICD-10-CM

## 2018-12-30 ENCOUNTER — Encounter (HOSPITAL_COMMUNITY): Payer: BLUE CROSS/BLUE SHIELD | Admitting: Internal Medicine

## 2018-12-31 DIAGNOSIS — C44722 Squamous cell carcinoma of skin of right lower limb, including hip: Secondary | ICD-10-CM | POA: Diagnosis not present

## 2019-01-11 ENCOUNTER — Encounter: Payer: Self-pay | Admitting: Oncology

## 2019-01-12 ENCOUNTER — Telehealth: Payer: Self-pay

## 2019-01-28 DIAGNOSIS — E1165 Type 2 diabetes mellitus with hyperglycemia: Secondary | ICD-10-CM | POA: Diagnosis not present

## 2019-01-28 DIAGNOSIS — E039 Hypothyroidism, unspecified: Secondary | ICD-10-CM | POA: Diagnosis not present

## 2019-01-28 DIAGNOSIS — Z23 Encounter for immunization: Secondary | ICD-10-CM | POA: Diagnosis not present

## 2019-01-28 DIAGNOSIS — Z794 Long term (current) use of insulin: Secondary | ICD-10-CM | POA: Diagnosis not present

## 2019-01-30 ENCOUNTER — Other Ambulatory Visit: Payer: Self-pay | Admitting: Internal Medicine

## 2019-01-30 DIAGNOSIS — E785 Hyperlipidemia, unspecified: Secondary | ICD-10-CM

## 2019-03-19 ENCOUNTER — Encounter (HOSPITAL_COMMUNITY): Payer: Self-pay | Admitting: Internal Medicine

## 2019-03-19 ENCOUNTER — Other Ambulatory Visit: Payer: Self-pay

## 2019-03-19 ENCOUNTER — Ambulatory Visit (HOSPITAL_COMMUNITY)
Admission: RE | Admit: 2019-03-19 | Discharge: 2019-03-19 | Disposition: A | Discharge status: Home / Self Care | Payer: BC Managed Care – PPO | Source: Ambulatory Visit | Attending: Internal Medicine | Admitting: Internal Medicine

## 2019-03-19 VITALS — BP 128/96 | HR 93 | Wt 277.2 lb

## 2019-03-19 DIAGNOSIS — I2510 Atherosclerotic heart disease of native coronary artery without angina pectoris: Secondary | ICD-10-CM | POA: Insufficient documentation

## 2019-03-19 DIAGNOSIS — Z794 Long term (current) use of insulin: Secondary | ICD-10-CM | POA: Diagnosis not present

## 2019-03-19 DIAGNOSIS — I428 Other cardiomyopathies: Secondary | ICD-10-CM | POA: Insufficient documentation

## 2019-03-19 DIAGNOSIS — R918 Other nonspecific abnormal finding of lung field: Secondary | ICD-10-CM | POA: Insufficient documentation

## 2019-03-19 DIAGNOSIS — Z6835 Body mass index (BMI) 35.0-35.9, adult: Secondary | ICD-10-CM | POA: Insufficient documentation

## 2019-03-19 DIAGNOSIS — Z20822 Contact with and (suspected) exposure to covid-19: Secondary | ICD-10-CM

## 2019-03-19 DIAGNOSIS — R0683 Snoring: Secondary | ICD-10-CM | POA: Diagnosis not present

## 2019-03-19 DIAGNOSIS — Z8249 Family history of ischemic heart disease and other diseases of the circulatory system: Secondary | ICD-10-CM | POA: Diagnosis not present

## 2019-03-19 DIAGNOSIS — I10 Essential (primary) hypertension: Secondary | ICD-10-CM | POA: Insufficient documentation

## 2019-03-19 DIAGNOSIS — Z79899 Other long term (current) drug therapy: Secondary | ICD-10-CM | POA: Insufficient documentation

## 2019-03-19 DIAGNOSIS — I5022 Chronic systolic (congestive) heart failure: Secondary | ICD-10-CM | POA: Diagnosis not present

## 2019-03-19 DIAGNOSIS — F419 Anxiety disorder, unspecified: Secondary | ICD-10-CM | POA: Insufficient documentation

## 2019-03-19 DIAGNOSIS — Z808 Family history of malignant neoplasm of other organs or systems: Secondary | ICD-10-CM | POA: Insufficient documentation

## 2019-03-19 DIAGNOSIS — Z85528 Personal history of other malignant neoplasm of kidney: Secondary | ICD-10-CM | POA: Insufficient documentation

## 2019-03-19 DIAGNOSIS — Z905 Acquired absence of kidney: Secondary | ICD-10-CM | POA: Insufficient documentation

## 2019-03-19 DIAGNOSIS — E669 Obesity, unspecified: Secondary | ICD-10-CM | POA: Insufficient documentation

## 2019-03-19 DIAGNOSIS — Z79890 Hormone replacement therapy (postmenopausal): Secondary | ICD-10-CM | POA: Insufficient documentation

## 2019-03-19 DIAGNOSIS — E1040 Type 1 diabetes mellitus with diabetic neuropathy, unspecified: Secondary | ICD-10-CM | POA: Insufficient documentation

## 2019-03-19 LAB — CBC
HCT: 46.3 % (ref 39.0–52.0)
Hemoglobin: 15.1 g/dL (ref 13.0–17.0)
MCH: 29.5 pg (ref 26.0–34.0)
MCHC: 32.6 g/dL (ref 30.0–36.0)
MCV: 90.4 fL (ref 80.0–100.0)
Platelets: 161 10*3/uL (ref 150–400)
RBC: 5.12 MIL/uL (ref 4.22–5.81)
RDW: 12.1 % (ref 11.5–15.5)
WBC: 3.9 10*3/uL — ABNORMAL LOW (ref 4.0–10.5)
nRBC: 0 % (ref 0.0–0.2)

## 2019-03-19 LAB — BASIC METABOLIC PANEL
Anion gap: 12 (ref 5–15)
BUN: 16 mg/dL (ref 6–20)
CO2: 27 mmol/L (ref 22–32)
Calcium: 9.2 mg/dL (ref 8.9–10.3)
Chloride: 100 mmol/L (ref 98–111)
Creatinine, Ser: 1.14 mg/dL (ref 0.61–1.24)
GFR calc Af Amer: >60 mL/min (ref >60)
GFR calc non Af Amer: >60 mL/min (ref >60)
Glucose, Bld: 122 mg/dL — ABNORMAL HIGH (ref 70–99)
Potassium: 4.1 mmol/L (ref 3.5–5.1)
Sodium: 139 mmol/L (ref 135–145)

## 2019-03-19 MED ORDER — CARVEDILOL 6.25 MG PO TABS: 9.3750 mg | ORAL_TABLET | Freq: Two times a day (BID) | ORAL | 3 refills | Status: DC

## 2019-03-19 NOTE — Progress Notes (Signed)
ADVANCED HF CLINIC NOTE  Referring Physician:  Lennie Hummer MD  Primary Cardiologist: None  HPI:  Gregory [name] is a 58 y/o male with h/o obesity, metastatic R renal cell CA s/p R nephrectomy in 9/17 with the subsequent discovery of 2 pulmonary nodules whom we follow for non-ischemic cardiomyopathy.  Developed microscopic hematuria and found to have large right renal mass c/w renal cell CA. Eventually underwent right radical nephrectomy with adrenal sparing on 02/07/16 at [facility]. Post-op developed bradycardia with HRs in 30s while sleeping. BP stable. When awakened had dizziness so given atropine. ECG revealed sinus brady in 40s with mild QT prolongation which resolved. Echo done and EF 45% with global HK. RV dilated  With normal function .  We saw him for the first time in October 2017. At that time we ordered cMRI and stress test. However in the interim he went back to [facility]. Found to have 2 metastatic lung nodules Echo showed EF at 46%. However MUGA with EF 28% so referred back to [place] for further evaluation prior to chemo. Underwent cath 04/09/16 as below. Had chemo and lung nodule resection.   1. 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 Otherwise normal coronaries 3. Nonischemic, dilated cardiomyopathy with EF 30-35% and global hypokinesis  cMRI 05/29/16 EF 28% No LGE  Had sleep study which was negative.   He is here today for regular follow up. Not exercising routinely. Just got through 9 days of [employer]. Works in yard without problem. No SOB or CP. No SOB on steps. No edema.  Echo 5/20 EF 35-40% ECHO 09/12/16 35-40%, RV ok. - Reviewed personally  Echo 05/22/17: EF 50-55%, grade 1 DD.   FHx:   Father died at 19 from HF (unknown CAD status) Mother died 57 from brain cancer Sister fine    Past Medical History:  Diagnosis Date  . Allergy   . Anemia    low iron  . Anxiety   . Cancer [facility])    Renal cell cancer  . Complex tear of lateral meniscus of right knee as current injury 03/20/2017  . Complex tear of medial meniscus of right knee 03/20/2017  . Diabetes mellitus without complication ([place])   . History of gastrointestinal hemorrhage   . Hypertension   . Hypertriglyceridemia   . Macular degeneration    lasar surgery- Dr Zigmund Daniel  . Neuropathy   . Nonischemic cardiomyopathy ([place])    EF 25-30% by echo 09/12/16  . Plantar warts   . PONV (postoperative nausea and vomiting)    " a little bit of nausea"  . Primary localized osteoarthritis of right knee 03/20/2017  . Renal cell carcinoma [facility])    s/p nephrectomy 02/07/16 at [facility])    Current Outpatient Medications  Medication Sig Dispense Refill  . atorvastatin (LIPITOR) 20 MG tablet TAKE 1 TABLET BY MOUTH EVERY NIGHT AT BEDTIME 90 tablet 1  . blood glucose meter kit and supplies KIT Dispense based on patient and insurance preference. Use up to four times daily as directed. (FOR ICD-9 250.00, 250.01). 1 each 0  . carvedilol (COREG) 6.25 MG tablet TAKE 1 TABLET BY MOUTH TWICE DAILY 180 tablet 3  . cetirizine (ZYRTEC) 10 MG tablet Take 10 mg by mouth daily.    . Cholecalciferol 50 MCG (2000 UT) TABS Take 2 tablets (4,000 Units total) by mouth daily. 180 tablet 1  . Continuous Blood Gluc Sensor (FREESTYLE LIBRE 14  DAY SENSOR) MISC U UTD    . ENTRESTO 49-51 MG TAKE 1 TABLET BY MOUTH TWICE DAILY 60 tablet 3  . insulin aspart (NOVOLOG FLEXPEN) 100 UNIT/ML FlexPen Add to meal coverage Sliding scale CBG 70 - 120: 0 units CBG 121 - 150: 1 unit,  CBG 151 - 200: 2 units,  CBG 201 - 250: 3 units,  CBG 251 - 300: 5 units,  CBG 301 - 350: 7 units,  CBG 351 - 400: 9 units   CBG > 400: 9 units and notify your MD (Patient taking differently: Inject 7-18 Units into the skin 3 (three) times daily with meals. Per sliding scale Add to meal coverage Sliding scale CBG 70 - 120: 0 units CBG 121 - 150: 1 unit,  CBG 151 - 200: 2 units,  CBG 201 -  250: 3 units,  CBG 251 - 300: 5 units,  CBG 301 - 350: 7 units,  CBG 351 - 400: 9 units   CBG > 400: 9 units and notify your MD) 15 mL 11  . levothyroxine (SYNTHROID, LEVOTHROID) 25 MCG tablet TAKE 1 TABLET ONCE DAILY BEFORE BREAKFAST 30 tablet 0  . OZEMPIC, 0.25 OR 0.5 MG/DOSE, 2 MG/1.5ML SOPN     . PARoxetine (PAXIL) 30 MG tablet TAKE 1 TABLET BY MOUTH EVERY DAY 90 tablet 1  . thiamine (VITAMIN B-1) 100 MG tablet Take 1 tablet (100 mg total) by mouth daily. 90 tablet 1   No current facility-administered medications for this encounter.     No Known Allergies    Social History   Socioeconomic History  . Marital status: Married    Spouse name: Not on file  . Number of children: Not on file  . Years of education: Not on file  . Highest education level: Not on file  Occupational History  . Not on file  Social Needs  . Financial resource strain: Not on file  . Food insecurity    Worry: Not on file    Inability: Not on file  . Transportation needs    Medical: Not on file    Non-medical: Not on file  Tobacco Use  . Smoking status: Never Smoker  . Smokeless tobacco: Never Used  Substance and Sexual Activity  . Alcohol use: Yes    Alcohol/week: 0.0 standard drinks    Comment: social  . Drug use: No  . Sexual activity: Not on file  Lifestyle  . Physical activity    Days per week: Not on file    Minutes per session: Not on file  . Stress: Not on file  Relationships  . Social Herbalist on phone: Not on file    Gets together: Not on file    Attends religious service: Not on file    Active member of club or organization: Not on file    Attends meetings of clubs or organizations: Not on file    Relationship status: Not on file  . Intimate partner violence    Fear of current or ex partner: Not on file    Emotionally abused: Not on file    Physically abused: Not on file    Forced sexual activity: Not on file  Other Topics Concern  . 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       Family History  Problem Relation Age of Onset  . Hypertension Mother   . Bipolar disorder Mother   . Cancer Mother 16       Brain  . Other Father        brain tumor  . Colon cancer Neg Hx   . Colon polyps Neg Hx   . Rectal cancer Neg Hx   . Stomach cancer Neg Hx   . Early death Neg Hx   . Hyperlipidemia Neg Hx   . Kidney disease Neg Hx     Vitals:   03/19/19 1409  BP: (!) 128/96  Pulse: 93  SpO2: 98%  Weight: 125.7 kg (277 lb 3.2 oz)   Wt Readings from Last 3 Encounters:  03/19/19 125.7 kg (277 lb 3.2 oz)  12/21/18 128.4 kg (283 lb 1.6 oz)  07/29/18 128.3 kg (282 lb 12 oz)    PHYSICAL EXAM: General:  Well appearing. No resp difficulty HEENT: normal Neck: supple. no JVD. Carotids 2+ bilat; no bruits. No lymphadenopathy or thryomegaly appreciated. Cor: PMI nondisplaced. Regular rate & rhythm. No rubs, gallops or murmurs. Lungs: clear Abdomen: obese soft, nontender, nondistended. No hepatosplenomegaly. No bruits or masses. Good bowel sounds. Extremities: no cyanosis, clubbing, rash, edema Neuro: alert & orientedx3, cranial nerves grossly intact. moves all 4 extremities w/o difficulty. Affect pleasant   ECG: NSR 91 + LVH Personally reviewed    ASSESSMENT & PLAN:  1. NICM:   - Unclear etiology. EF 30-35% by cath with NICM. cMRI 1/18 with EF 28%. Etiology remains unclear. No evidence of scar of infiltrative process on MRI. Echo 4/18 EF 35-40%. Echo 04/2017 EF 50-55% -  Stable NYHA I-II, volume status stable. -  Increase carvedilol to 9.375 bid -  Continue Entresto 49/51 mg bid.  - Unable to take spiro due to hyperkalemia - No SGLT2i with Type I DM  2. Metastatic Renal Cell CA with lung nodules - s/p nephrectomy. Currently stable. Has f/u scans next month in [place] @ [facility]  3. HTN:  -Blood pressure well controlled with Entresto.  4. Snoring - Recent sleep study negative.   5. CAD - 1v in small RCA. No s/s ischemia Agree with low-dose ASA and statin  6. Type I DM - Followed by Dr. Laqueta Carina, MD 2:38 PM

## 2019-03-19 NOTE — Patient Instructions (Signed)
INCREASE Carvedilol to 9.375mg  (1.5 tabs) twice a day  Labs today We will only contact you if something comes back abnormal or we need to make some changes. Otherwise no news is good news!  Your physician has requested that you have an echocardiogram. Echocardiography is a painless test that uses sound waves to create images of your heart. It provides your doctor with information about the size and shape of your heart and how well your heart's chambers and valves are working. This procedure takes approximately one hour. There are no restrictions for this procedure.  Your physician recommends that you schedule a follow-up appointment in: 6 months with an ECHO, you will get a call closer to that time to schedule this appointment.  At the [facility], you and your health needs are our priority. As part of our continuing mission to provide you with exceptional heart care, we have created designated Provider Care Teams. These Care Teams include your primary Cardiologist (physician) and Advanced Practice Providers (APPs- Physician Assistants and Nurse Practitioners) who all work together to provide you with the care you need, when you need it.   You may see any of the following providers on your designated Care Team at your next follow up: Marland Kitchen Dr Glori Bickers . Dr Loralie Champagne . Darrick Grinder, NP . Lyda Jester, PA   Please be sure to bring in all your medications bottles to every appointment.  '

## 2019-03-20 ENCOUNTER — Encounter: Payer: Self-pay | Admitting: Family Medicine

## 2019-03-20 LAB — NOVEL CORONAVIRUS, NAA: SARS-CoV-2, NAA: DETECTED — AB

## 2019-03-25 ENCOUNTER — Telehealth: Payer: Self-pay

## 2019-03-25 NOTE — Telephone Encounter (Signed)
Copied from [place] [id number]. Topic: Appointment Scheduling - Scheduling Inquiry for Clinic >> Mar 24, 2019  1:17 PM Scherrie Gerlach wrote: Reason for CRM: pt was covid positive last week and still not feeling himself.  Fatigued low fever, no taste, smell, and would like to do a virtual with his dr to discuss how to manage his sx, and just discuss what he should do.Marland Kitchen  Pt aware someone will call back before day end >> Mar 25, 2019 10:03 AM Cairrikier Dian Queen, CMA wrote: Pt called back. Pt stated that he is having some nausea and diarrhea with fatigue and sleepiness. I informed pt that these symptoms (as long as he feels safe) can be management at home and to rest and keep hydrated along with brat diet to help with the diarrhea and nausea. Pt stated understanding and will treat at home. If pt sx change or worsen he will call us back.  >> Mar 24, 2019  3:32 PM Cairrikier Dian Queen, CMA wrote: lvm for pt to call back.  >> Mar 24, 2019  1:30 PM Para Skeans A wrote: Please advise, Thank you.

## 2019-03-25 NOTE — Telephone Encounter (Deleted)
Copied from [place] [id number]. Topic: Appointment Scheduling - Scheduling Inquiry for Clinic >> Mar 24, 2019  1:17 PM Scherrie Gerlach wrote: Reason for CRM: pt was covid positive last week and still not feeling himself.  Fatigued low fever, no taste, smell, and would like to do a virtual with his dr to discuss how to manage his sx, and just discuss what he should do.Marland Kitchen  Pt aware someone will call back before day end >> Mar 25, 2019 10:03 AM Cairrikier Dian Queen, CMA wrote: Pt called back. Pt stated that he is having some nausea and diarrhea with fatigue and sleepiness. I informed pt that these symptoms (as long as he feels safe) can be management at home and to rest and keep hydrated along with brat diet to help with the diarrhea and nausea. Pt stated understanding and will treat at home. If pt sx change or worsen he will call us back.  >> Mar 24, 2019  3:32 PM Cairrikier Dian Queen, CMA wrote: lvm for pt to call back.  >> Mar 24, 2019  1:30 PM Para Skeans A wrote: Please advise, Thank you.

## 2019-04-06 DIAGNOSIS — L821 Other seborrheic keratosis: Secondary | ICD-10-CM | POA: Diagnosis not present

## 2019-04-06 DIAGNOSIS — L820 Inflamed seborrheic keratosis: Secondary | ICD-10-CM | POA: Diagnosis not present

## 2019-04-06 DIAGNOSIS — L218 Other seborrheic dermatitis: Secondary | ICD-10-CM | POA: Diagnosis not present

## 2019-04-06 DIAGNOSIS — D1801 Hemangioma of skin and subcutaneous tissue: Secondary | ICD-10-CM | POA: Diagnosis not present

## 2019-04-06 DIAGNOSIS — D225 Melanocytic nevi of trunk: Secondary | ICD-10-CM | POA: Diagnosis not present

## 2019-04-19 DIAGNOSIS — H2513 Age-related nuclear cataract, bilateral: Secondary | ICD-10-CM | POA: Diagnosis not present

## 2019-04-19 DIAGNOSIS — H16223 Keratoconjunctivitis sicca, not specified as Sjogren's, bilateral: Secondary | ICD-10-CM | POA: Diagnosis not present

## 2019-04-19 DIAGNOSIS — H18413 Arcus senilis, bilateral: Secondary | ICD-10-CM | POA: Diagnosis not present

## 2019-04-19 DIAGNOSIS — H35711 Central serous chorioretinopathy, right eye: Secondary | ICD-10-CM | POA: Diagnosis not present

## 2019-04-29 DIAGNOSIS — Z85528 Personal history of other malignant neoplasm of kidney: Secondary | ICD-10-CM | POA: Diagnosis not present

## 2019-04-29 DIAGNOSIS — C7802 Secondary malignant neoplasm of left lung: Secondary | ICD-10-CM | POA: Diagnosis not present

## 2019-04-29 DIAGNOSIS — Z905 Acquired absence of kidney: Secondary | ICD-10-CM | POA: Diagnosis not present

## 2019-04-29 DIAGNOSIS — Z794 Long term (current) use of insulin: Secondary | ICD-10-CM | POA: Diagnosis not present

## 2019-04-29 DIAGNOSIS — Z79899 Other long term (current) drug therapy: Secondary | ICD-10-CM | POA: Diagnosis not present

## 2019-04-29 DIAGNOSIS — R9389 Abnormal findings on diagnostic imaging of other specified body structures: Secondary | ICD-10-CM | POA: Diagnosis not present

## 2019-04-29 DIAGNOSIS — Z7982 Long term (current) use of aspirin: Secondary | ICD-10-CM | POA: Diagnosis not present

## 2019-04-29 DIAGNOSIS — Z79890 Hormone replacement therapy (postmenopausal): Secondary | ICD-10-CM | POA: Diagnosis not present

## 2019-04-29 DIAGNOSIS — C641 Malignant neoplasm of right kidney, except renal pelvis: Secondary | ICD-10-CM | POA: Diagnosis not present

## 2019-04-30 DIAGNOSIS — R918 Other nonspecific abnormal finding of lung field: Secondary | ICD-10-CM | POA: Diagnosis not present

## 2019-04-30 DIAGNOSIS — Z905 Acquired absence of kidney: Secondary | ICD-10-CM | POA: Diagnosis not present

## 2019-04-30 DIAGNOSIS — E1065 Type 1 diabetes mellitus with hyperglycemia: Secondary | ICD-10-CM | POA: Diagnosis not present

## 2019-04-30 DIAGNOSIS — Z85528 Personal history of other malignant neoplasm of kidney: Secondary | ICD-10-CM | POA: Diagnosis not present

## 2019-04-30 DIAGNOSIS — Z08 Encounter for follow-up examination after completed treatment for malignant neoplasm: Secondary | ICD-10-CM | POA: Diagnosis not present

## 2019-04-30 DIAGNOSIS — Z794 Long term (current) use of insulin: Secondary | ICD-10-CM | POA: Diagnosis not present

## 2019-05-24 ENCOUNTER — Other Ambulatory Visit (HOSPITAL_COMMUNITY): Payer: Self-pay | Admitting: *Deleted

## 2019-05-24 MED ORDER — ENTRESTO 49-51 MG PO TABS: 1.0000 | ORAL_TABLET | Freq: Two times a day (BID) | ORAL | 3 refills | Status: DC

## 2019-05-25 DIAGNOSIS — E1165 Type 2 diabetes mellitus with hyperglycemia: Secondary | ICD-10-CM | POA: Diagnosis not present

## 2019-05-25 DIAGNOSIS — Z794 Long term (current) use of insulin: Secondary | ICD-10-CM | POA: Diagnosis not present

## 2019-05-25 DIAGNOSIS — E039 Hypothyroidism, unspecified: Secondary | ICD-10-CM | POA: Diagnosis not present

## 2019-05-26 ENCOUNTER — Ambulatory Visit: Payer: BC Managed Care – PPO | Attending: Internal Medicine

## 2019-05-26 DIAGNOSIS — Z20822 Contact with and (suspected) exposure to covid-19: Secondary | ICD-10-CM

## 2019-05-26 DIAGNOSIS — Z20828 Contact with and (suspected) exposure to other viral communicable diseases: Secondary | ICD-10-CM | POA: Diagnosis not present

## 2019-05-27 LAB — NOVEL CORONAVIRUS, NAA: SARS-CoV-2, NAA: NOT DETECTED

## 2019-06-12 ENCOUNTER — Other Ambulatory Visit: Payer: Self-pay | Admitting: Internal Medicine

## 2019-06-12 DIAGNOSIS — F418 Other specified anxiety disorders: Secondary | ICD-10-CM

## 2019-06-12 MED ORDER — PAROXETINE HCL 30 MG PO TABS: 30.0000 mg | ORAL_TABLET | Freq: Every day | ORAL | 0 refills | Status: DC

## 2019-07-06 ENCOUNTER — Encounter: Payer: Self-pay | Admitting: Internal Medicine

## 2019-07-06 ENCOUNTER — Ambulatory Visit (INDEPENDENT_AMBULATORY_CARE_PROVIDER_SITE_OTHER): Payer: BC Managed Care – PPO | Admitting: Internal Medicine

## 2019-07-06 DIAGNOSIS — R062 Wheezing: Secondary | ICD-10-CM | POA: Diagnosis not present

## 2019-07-06 DIAGNOSIS — I5022 Chronic systolic (congestive) heart failure: Secondary | ICD-10-CM

## 2019-07-06 MED ORDER — BREO ELLIPTA 100-25 MCG/INH IN AEPB: 1.0000 | INHALATION_SPRAY | Freq: Every day | RESPIRATORY_TRACT | 5 refills | Status: DC

## 2019-07-06 MED ORDER — PROMETHAZINE-CODEINE 6.25-10 MG/5ML PO SYRP: 5.0000 mL | ORAL_SOLUTION | Freq: Four times a day (QID) | ORAL | 0 refills | Status: DC | PRN

## 2019-07-06 NOTE — Assessment & Plan Note (Signed)
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

## 2019-07-06 NOTE — Progress Notes (Signed)
Virtual Visit via Video Note  I connected with Gregory [name] on 07/06/19 at  1:20 PM EST by a video enabled telemedicine application and verified that I am speaking with the correct person using two identifiers.   I discussed the limitations of evaluation and management by telemedicine and the availability of in person appointments. The patient expressed understanding and agreed to proceed.  History of Present Illness: C/o wheezing at night x 1-2 months H/o asthma - remote C/o cough - worse at night. Sleeps on the stomach. He had COVID 19 in Oct 2020. CT in [place] (f/u for cancer) w/ground glass changes  There has been no runny nose, no chest pain, no DOE No abdominal pain, diarrhea, constipation, skin rashes.   Observations/Objective: The patient appears to be in no acute distress, looks well.  Assessment and Plan:  See my Assessment and Plan. Follow Up Instructions:    I discussed the assessment and treatment plan with the patient. The patient was provided an opportunity to ask questions and all were answered. The patient agreed with the plan and demonstrated an understanding of the instructions.   The patient was advised to call back or seek an in-person evaluation if the symptoms worsen or if the condition fails to improve as anticipated.  I provided face-to-face time during this encounter. We were at different locations.   Walker Kehr, MD

## 2019-07-06 NOTE — Assessment & Plan Note (Signed)
Seems to be doing well If breo does not work - consider diuretic

## 2019-07-12 ENCOUNTER — Telehealth: Payer: Self-pay | Admitting: Physician Assistant

## 2019-07-12 ENCOUNTER — Ambulatory Visit (INDEPENDENT_AMBULATORY_CARE_PROVIDER_SITE_OTHER): Payer: BC Managed Care – PPO | Admitting: Internal Medicine

## 2019-07-12 ENCOUNTER — Encounter: Payer: Self-pay | Admitting: Internal Medicine

## 2019-07-12 ENCOUNTER — Telehealth: Payer: Self-pay

## 2019-07-12 DIAGNOSIS — I5020 Unspecified systolic (congestive) heart failure: Secondary | ICD-10-CM | POA: Diagnosis not present

## 2019-07-12 DIAGNOSIS — R062 Wheezing: Secondary | ICD-10-CM

## 2019-07-12 DIAGNOSIS — I5022 Chronic systolic (congestive) heart failure: Secondary | ICD-10-CM | POA: Diagnosis not present

## 2019-07-12 MED ORDER — FUROSEMIDE 40 MG PO TABS: 40.0000 mg | ORAL_TABLET | Freq: Every day | ORAL | 0 refills | Status: DC

## 2019-07-12 MED ORDER — METHYLPREDNISOLONE 4 MG PO TBPK: ORAL_TABLET | ORAL | 0 refills | Status: DC

## 2019-07-12 NOTE — Progress Notes (Signed)
Virtual Visit via Video Note  I connected with Gregory [name] on 07/12/19 at  4:00 PM EST by a video enabled telemedicine application and verified that I am speaking with the correct person using two identifiers.   I discussed the limitations of evaluation and management by telemedicine and the availability of in person appointments. The patient expressed understanding and agreed to proceed.  History of Present Illness: We need to follow-up on cough and shortness of breath.  The patient is concerned about ongoing problem with shortness of breath that wakes him up from sleep at around 1-2 AM and persists for an hour two.  Sitting up seems to help.  There is no cough.  No mucus.  No fever.  No leg swelling.  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  He has been on Entresto.  He is seeing Dr. Haroldine Laws.  He tells me that he was at [facility] in [place] for cancer follow-up.  His chest CT scan showed groundglass changes consistent with post Covid changes..  He had Covid in October 2020  There has been no runny nose, cough, chest pain, abdominal pain, diarrhea, constipation, arthralgias, skin rashes.   Observations/Objective: The patient appears to be in no acute distress, looks well.  He is not dyspneic  Assessment and Plan:  See my Assessment and Plan. Follow Up Instructions:    I discussed the assessment and treatment plan with the patient. The patient was provided an opportunity to ask questions and all were answered. The patient agreed with the plan and demonstrated an understanding of the instructions.   The patient was advised to call back or seek an in-person evaluation if the symptoms worsen or if the condition fails to improve as anticipated.  I provided face-to-face time during this encounter. We were at different locations.   Walker Kehr, MD

## 2019-07-12 NOTE — Assessment & Plan Note (Signed)
Worse.  Obtain chest x-ray tomorrow, obtain labs.  Empiric furosemide 40 mg daily until better.  Follow-up with Dr. Haroldine Laws.  Continue with other meds.

## 2019-07-12 NOTE — Assessment & Plan Note (Signed)
Will use Medrol Dosepak for dyspnea.  The patient to prednisone in the past be able to adjust his diet/insulin for potentially elevated glucose on steroids.

## 2019-07-12 NOTE — Telephone Encounter (Signed)
Paged by [name] regarding worsening DOE and waking up at night recently. He did not feel any orthopnea. He had COVID 19 in Oct 2020. He mentioned recent CT at outside facility demonstrated ground glass opacities. He denies any leg edema. Prior to today, he is not on loop diuretic. He was on Entresto which has very mild diuretic effect. PMH of renal cell cancer with lung nodule (however in remission with no recurrence after resection per patient), CAD, and NICM EF 35-40%.  According to the patient, he had virtual visit with Dr. Alain Marion today who prescribed him 40mg  daily of lasix and recommend CXR. I think this is a very reasonable start with trial of medical therapy. Hard to tell if his symptom is pulmonary vs CHF. Ground glass opacity on CXR may be related to scarring from COVID 19. His weight today is 260 lbs, only 3 lbs over his dry weight during last HF visit. I recommended a earlier CHF visit so a physical exam can be performed to better assess his condition. Will send separate staff message to CHF service to arrange the closest followup.  He is currently living in [place]

## 2019-07-12 NOTE — Assessment & Plan Note (Signed)
Medrol Dosepak.   Continue Breo, obtain chest x-ray.   Will use Medrol Dosepak for dyspnea.  The patient to prednisone in the past be able to adjust his diet/insulin for potentially elevated glucose on steroids.

## 2019-07-13 ENCOUNTER — Other Ambulatory Visit (INDEPENDENT_AMBULATORY_CARE_PROVIDER_SITE_OTHER): Payer: BC Managed Care – PPO

## 2019-07-13 ENCOUNTER — Ambulatory Visit (INDEPENDENT_AMBULATORY_CARE_PROVIDER_SITE_OTHER): Payer: BC Managed Care – PPO

## 2019-07-13 DIAGNOSIS — I5020 Unspecified systolic (congestive) heart failure: Secondary | ICD-10-CM | POA: Diagnosis not present

## 2019-07-13 DIAGNOSIS — R062 Wheezing: Secondary | ICD-10-CM

## 2019-07-13 DIAGNOSIS — J9811 Atelectasis: Secondary | ICD-10-CM | POA: Diagnosis not present

## 2019-07-13 LAB — BASIC METABOLIC PANEL
BUN: 20 mg/dL (ref 6–23)
CO2: 29 mEq/L (ref 19–32)
Calcium: 8.8 mg/dL (ref 8.4–10.5)
Chloride: 102 mEq/L (ref 96–112)
Creatinine, Ser: 1.34 mg/dL (ref 0.40–1.50)
GFR: 54.41 mL/min — ABNORMAL LOW (ref >60.00)
Glucose, Bld: 232 mg/dL — ABNORMAL HIGH (ref 70–99)
Potassium: 3.8 mEq/L (ref 3.5–5.1)
Sodium: 140 mEq/L (ref 135–145)

## 2019-07-13 LAB — D-DIMER, QUANTITATIVE: D-Dimer, Quant: 0.65 mcg/mL FEU — ABNORMAL HIGH (ref <0.50)

## 2019-07-13 NOTE — Telephone Encounter (Signed)
Pt sch to see Dr Sanda Linger 2/18

## 2019-07-14 ENCOUNTER — Other Ambulatory Visit: Payer: Self-pay

## 2019-07-14 ENCOUNTER — Ambulatory Visit (HOSPITAL_COMMUNITY)
Admission: RE | Admit: 2019-07-14 | Discharge: 2019-07-14 | Disposition: A | Discharge status: Home / Self Care | Payer: BC Managed Care – PPO | Source: Ambulatory Visit | Attending: Internal Medicine | Admitting: Internal Medicine

## 2019-07-14 DIAGNOSIS — I2510 Atherosclerotic heart disease of native coronary artery without angina pectoris: Secondary | ICD-10-CM

## 2019-07-14 DIAGNOSIS — I10 Essential (primary) hypertension: Secondary | ICD-10-CM

## 2019-07-14 DIAGNOSIS — I5022 Chronic systolic (congestive) heart failure: Secondary | ICD-10-CM

## 2019-07-14 MED ORDER — FUROSEMIDE 40 MG PO TABS: 40.0000 mg | ORAL_TABLET | ORAL | 0 refills | Status: DC

## 2019-07-14 MED ORDER — POTASSIUM CHLORIDE CRYS ER 20 MEQ PO TBCR: EXTENDED_RELEASE_TABLET | ORAL | 3 refills | Status: DC

## 2019-07-14 NOTE — Progress Notes (Signed)
Heart Failure TeleHealth Note  Due to national recommendations of social distancing due to [place] 19, Audio/video telehealth visit is felt to be most appropriate for this patient at this time.  See MyChart message from today for patient consent regarding telehealth for [facility].  Date:  07/14/2019   ID:  Gregory [name], DOB [birth year], MRN [id number]  Location: Home  Provider location: [facility] Type of Visit: Established patient  PCP:  [name], MD  Cardiologist:  No primary care provider on file. Primary HF: Gregory [name]  Chief Complaint: Heart Failure follow-up   History of Present Illness:  Gregory [name] is a 59 y/o male with h/o obesity, metastatic R renal cell CA s/p R nephrectomy in 9/17 with the subsequent discovery of 2 pulmonary nodules whom we follow for non-ischemic cardiomyopathy.  Developed microscopic hematuria and found to have large right renal mass c/w renal cell CA. Eventually underwent right radical nephrectomy with adrenal sparing on 02/07/16 at [facility]. Post-op developed bradycardia with HRs in 30s while sleeping. BP stable. When awakened had dizziness so given atropine. ECG revealed sinus brady in 40s with mild QT prolongation which resolved. Echo done and EF 45% with global HK. RV dilated  With normal function .  We saw him for the first time in October 2017. At that time we ordered cMRI and stress test. However in the interim he went back to [facility]. Found to have 2 metastatic lung nodules Echo showed EF at 46%. However MUGA with EF 28% so referred back to [place] for further evaluation prior to chemo. Underwent cath 04/09/16 as below. Had chemo and lung nodule resection.   1. 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 Otherwise normal coronaries 3. Nonischemic, dilated cardiomyopathy with EF 30-35% and global hypokinesis  cMRI 05/29/16 EF 28% No LGE  Had  sleep study which was negative.   He presents via Engineer, civil (consulting) for a telehealth visit today. He presents as an add-on visit for further evaluation for SOB and cough. Had COVID 03/19/19. Post-COVID seen at [facility] and had CT with ground glass changes felt to be post-COVID changes. Saw Dr. Alain Marion on 2/15 c/o waking up with SOB. Also noted some SOB walking up hills. No edema. No fevers or chills. Mild wheezing. Started on lasix 40 daily and prednisone and now feels much better - back to normal. He feels the steroids are what helped.  CXR clear except for left basilar atelectasis.   Echos:    Echo 5/20 EF 35-40% ECHO 09/12/16 35-40%, RV ok. - Reviewed personally  Echo 05/22/17: EF 50-55%, grade 1 DD.    Gregory [name] denies symptoms worrisome for COVID 19.   Past Medical History:  Diagnosis Date  . Allergy   . Anemia    low iron  . Anxiety   . Cancer [facility])    Renal cell cancer  . Complex tear of lateral meniscus of right knee as current injury 03/20/2017  . Complex tear of medial meniscus of right knee 03/20/2017  . Diabetes mellitus without complication ([place])   . History of gastrointestinal hemorrhage   . Hypertension   . Hypertriglyceridemia   . Macular degeneration    lasar surgery- Dr Zigmund Nitesh Pitstick  . Neuropathy   . Nonischemic cardiomyopathy ([place])    EF 25-30% by echo 09/12/16  . Plantar warts   . PONV (postoperative nausea and vomiting)    "  a little bit of nausea"  . Primary localized osteoarthritis of right knee 03/20/2017  . Renal cell carcinoma [facility])    s/p nephrectomy 02/07/16 at [facility])   Past Surgical History:  Procedure Laterality Date  . CARDIAC CATHETERIZATION N/A 04/19/2016   Procedure: Left Heart Cath and Coronary Angiography;  Surgeon: Jolaine Artist, MD;  Location: [place] [facility];  Service: Cardiovascular;  Laterality: N/A;  . CHONDROPLASTY Right 03/20/2017   Procedure: CHONDROPLASTY;  Surgeon: Marchia Bond, MD;  Location: [place];   Service: Orthopedics;  Laterality: Right;  . COLONOSCOPY  03/22/2005  . EYE SURGERY Right    laser surgery, right eye macular degeneration  . KNEE ARTHROSCOPY Right    x 2  . KNEE ARTHROSCOPY WITH MEDIAL MENISECTOMY Right 03/20/2017   Procedure: KNEE ARTHROSCOPY WITH MEDIAL AND LATERAL MENISECTOMY;  Surgeon: Marchia Bond, MD;  Location: [place];  Service: Orthopedics;  Laterality: Right;  . NEPHRECTOMY Right   . orif fx fibula    . sebaceous cyst excision  1985  . TONSILLECTOMY    . TOTAL SHOULDER ARTHROPLASTY Left 11/11/2014   Procedure: LEFT TOTAL SHOULDER ARTHROPLASTY;  Surgeon: Netta Cedars, MD;  Location: [place];  Service: Orthopedics;  Laterality: Left;  Marland Kitchen VASECTOMY       Current Outpatient Medications  Medication Sig Dispense Refill  . atorvastatin (LIPITOR) 20 MG tablet TAKE 1 TABLET BY MOUTH EVERY NIGHT AT BEDTIME 90 tablet 1  . blood glucose meter kit and supplies KIT Dispense based on patient and insurance preference. Use up to four times daily as directed. (FOR ICD-9 250.00, 250.01). 1 each 0  . carvedilol (COREG) 6.25 MG tablet Take 1.5 tablets (9.375 mg total) by mouth 2 (two) times daily. 270 tablet 3  . cetirizine (ZYRTEC) 10 MG tablet Take 10 mg by mouth daily.    . Cholecalciferol 50 MCG (2000 UT) TABS Take 2 tablets (4,000 Units total) by mouth daily. 180 tablet 1  . Continuous Blood Gluc Sensor (FREESTYLE LIBRE 14 DAY SENSOR) MISC U UTD    . fluticasone furoate-vilanterol (BREO ELLIPTA) 100-25 MCG/INH AEPB Inhale 1 puff into the lungs daily. 1 each 5  . furosemide (LASIX) 40 MG tablet Take 1 tablet (40 mg total) by mouth daily. 30 tablet 0  . insulin aspart (NOVOLOG FLEXPEN) 100 UNIT/ML FlexPen Add to meal coverage Sliding scale CBG 70 - 120: 0 units CBG 121 - 150: 1 unit,  CBG 151 - 200: 2 units,  CBG 201 - 250: 3 units,  CBG 251 - 300: 5 units,  CBG 301 - 350: 7 units,  CBG 351 - 400: 9 units   CBG > 400: 9 units and notify your MD (Patient taking differently: Inject  7-18 Units into the skin 3 (three) times daily with meals. Per sliding scale Add to meal coverage Sliding scale CBG 70 - 120: 0 units CBG 121 - 150: 1 unit,  CBG 151 - 200: 2 units,  CBG 201 - 250: 3 units,  CBG 251 - 300: 5 units,  CBG 301 - 350: 7 units,  CBG 351 - 400: 9 units   CBG > 400: 9 units and notify your MD) 15 mL 11  . levothyroxine (SYNTHROID, LEVOTHROID) 25 MCG tablet TAKE 1 TABLET ONCE DAILY BEFORE BREAKFAST 30 tablet 0  . methylPREDNISolone (MEDROL DOSEPAK) 4 MG TBPK tablet As directed 21 tablet 0  . OZEMPIC, 0.25 OR 0.5 MG/DOSE, 2 MG/1.5ML SOPN     . PARoxetine (PAXIL) 30 MG  tablet Take 1 tablet (30 mg total) by mouth daily. 90 tablet 0  . promethazine-codeine (PHENERGAN WITH CODEINE) 6.25-10 MG/5ML syrup Take 5 mLs by mouth every 6 (six) hours as needed for cough. 300 mL 0  . sacubitril-valsartan (ENTRESTO) 49-51 MG Take 1 tablet by mouth 2 (two) times daily. 180 tablet 3  . thiamine (VITAMIN B-1) 100 MG tablet Take 1 tablet (100 mg total) by mouth daily. 90 tablet 1   No current facility-administered medications for this encounter.    Allergies:   Patient has no known allergies.   Social History:  The patient  reports that he has never smoked. He has never used smokeless tobacco. He reports current alcohol use. He reports that he does not use drugs.   Family History:  The patient's family history includes Bipolar disorder in his mother; Cancer (age of onset: 22) in his mother; Hypertension in his mother; Other in his father.   ROS:  Please see the history of present illness.   All other systems are personally reviewed and negative.   Exam:  (Video/Tele Health Call; Exam is subjective and or/visual.) General:  Speaks in full sentences. No resp difficulty. Lungs: Normal respiratory effort with conversation.  Abdomen: Non-distended per patient report Extremities: Pt denies edema. Neuro: Alert & oriented x 3.   Recent Labs: 07/29/2018: TSH 2.09 12/15/2018: ALT  27 03/19/2019: Hemoglobin 15.1; Platelets 161 07/13/2019: BUN 20; Creatinine, Ser 1.34; Potassium 3.8; Sodium 140  Personally reviewed   Wt Readings from Last 3 Encounters:  03/19/19 125.7 kg (277 lb 3.2 oz)  12/21/18 128.4 kg (283 lb 1.6 oz)  07/29/18 128.3 kg (282 lb 12 oz)      ASSESSMENT AND PLAN:  1. Dyspnea - history most c/w with volume overload/HF which is now much improved with lasix and prednisone.  - change lasix to '40mg'$  MWF will add kcl 41mq each time - can stop steroids  - will repeat echo   2. Chronic systolic HF due to NICM   - Unclear etiology. EF 30-35% by cath with NICM. cMRI 1/18 with EF 28%. Etiology remains unclear. No evidence of scar of infiltrative process on MRI. Echo 4/18 EF 35-40%. Echo 04/2017 EF 50-55% -  NYHA II volume status improved with recent lasix addition. Please see meds as above.  -  Continue carvedilol to 9.375 bid -  Continue Entresto 49/51 mg bid.  - Unable to take spiro due to hyperkalemia - No SGLT2i with Type I DM  3. Metastatic Renal Cell CA with lung nodules - s/p nephrectomy. Currently stable. [place] [facility] [place]. HTN:  -Blood pressure well controlled with Entresto.  5. Snoring - Recent sleep study negative.   6. CAD - 1v in small RCA. No s/s ischemia Agree with low-dose ASA and statin  7. Type I DM - Followed by Dr. KBuddy Duty  COVID screen The patient does not have any symptoms that suggest any further testing/ screening at this time.  Social distancing reinforced today.  Recommended follow-up:  As above  Relevant cardiac medications were reviewed at length with the patient today.   The patient does not have concerns regarding their medications at this time.   The following changes were made today:  As above  Today, I have spent 18 minutes with the patient with telehealth technology discussing the above issues .    Signed, DGlori Bickers MD  07/14/2019 4:17 PM  [facility] [place] [facility] [place] [address] [phone] (office) [phone] (fax)

## 2019-07-14 NOTE — Progress Notes (Signed)
Medication changes made. Echo ordered. Follow up sent to scheduler.

## 2019-07-14 NOTE — Addendum Note (Signed)
Encounter addended by: Marlise Eves, RN on: 07/14/2019 5:29 PM  Actions taken: Order list changed, Diagnosis association updated, Charge Capture section accepted, Clinical Note Signed

## 2019-07-15 ENCOUNTER — Encounter (HOSPITAL_COMMUNITY): Payer: BC Managed Care – PPO | Admitting: Internal Medicine

## 2019-07-26 ENCOUNTER — Ambulatory Visit (INDEPENDENT_AMBULATORY_CARE_PROVIDER_SITE_OTHER): Payer: BC Managed Care – PPO | Admitting: Family Medicine

## 2019-07-26 ENCOUNTER — Encounter: Payer: Self-pay | Admitting: Family Medicine

## 2019-07-26 ENCOUNTER — Other Ambulatory Visit: Payer: Self-pay

## 2019-07-26 ENCOUNTER — Ambulatory Visit (INDEPENDENT_AMBULATORY_CARE_PROVIDER_SITE_OTHER): Payer: BC Managed Care – PPO

## 2019-07-26 VITALS — BP 132/98 | HR 90 | Temp 98.2°F | Wt 277.0 lb

## 2019-07-26 DIAGNOSIS — I5022 Chronic systolic (congestive) heart failure: Secondary | ICD-10-CM

## 2019-07-26 DIAGNOSIS — Z8616 Personal history of COVID-19: Secondary | ICD-10-CM

## 2019-07-26 DIAGNOSIS — R0602 Shortness of breath: Secondary | ICD-10-CM

## 2019-07-26 DIAGNOSIS — J984 Other disorders of lung: Secondary | ICD-10-CM | POA: Diagnosis not present

## 2019-07-26 DIAGNOSIS — R809 Proteinuria, unspecified: Secondary | ICD-10-CM | POA: Diagnosis not present

## 2019-07-26 MED ORDER — ALBUTEROL SULFATE HFA 108 (90 BASE) MCG/ACT IN AERS: 2.0000 | INHALATION_SPRAY | RESPIRATORY_TRACT | Status: DC | PRN

## 2019-07-26 MED ORDER — ALBUTEROL SULFATE HFA 108 (90 BASE) MCG/ACT IN AERS: 2.0000 | INHALATION_SPRAY | Freq: Once | RESPIRATORY_TRACT | Status: DC

## 2019-07-26 NOTE — Patient Instructions (Signed)
I have placed an ordered for pulmonology for pulmonary function test. There office will contact you regarding the time to schedule visit. I have provided you with a albuterol inhaler to use 2 puffs every 4 hours as needed. Labs will be resulted via mychart.      Shortness of Breath, Adult Shortness of breath means you have trouble breathing. Shortness of breath could be a sign of a medical problem. Follow these instructions at home:   Watch for any changes in your symptoms.  Do not use any products that contain nicotine or tobacco, such as cigarettes, e-cigarettes, and chewing tobacco.  Do not smoke. Smoking can cause shortness of breath. If you need help to quit smoking, ask your doctor.  Avoid things that can make it harder to breathe, such as: ? Mold. ? Dust. ? Air pollution. ? Chemical smells. ? Things that can cause allergy symptoms (allergens), if you have allergies.  Keep your living space clean. Use products that help remove mold and dust.  Rest as needed. Slowly return to your normal activities.  Take over-the-counter and prescription medicines only as told by your doctor. This includes oxygen therapy and inhaled medicines.  Keep all follow-up visits as told by your doctor. This is important. Contact a doctor if:  Your condition does not get better as soon as expected.  You have a hard time doing your normal activities, even after you rest.  You have new symptoms. Get help right away if:  Your shortness of breath gets worse.  You have trouble breathing when you are resting.  You feel light-headed or you pass out (faint).  You have a cough that is not helped by medicines.  You cough up blood.  You have pain with breathing.  You have pain in your chest, arms, shoulders, or belly (abdomen).  You have a fever.  You cannot walk up stairs.  You cannot exercise the way you normally do. These symptoms may represent a serious problem that is an emergency.  Do not wait to see if the symptoms will go away. Get medical help right away. Call your local emergency services (911 in the [place] Do not drive yourself to the hospital. Summary  Shortness of breath is when you have trouble breathing enough air. It can be a sign of a medical problem.  Avoid things that make it hard for you to breathe, such as smoking, pollution, mold, and dust.  Watch for any changes in your symptoms. Contact your doctor if you do not get better or you get worse. This information is not intended to replace advice given to you by your health care provider. Make sure you discuss any questions you have with your health care provider. Document Revised: 10/13/2017 Document Reviewed: 10/13/2017 Elsevier Patient Education  [place].

## 2019-07-26 NOTE — Progress Notes (Signed)
Patient ID: Gregory [name], male    DOB: 16-Mar-1961, 59 y.o.   MRN: [id number]  PCP: [name], MD  No chief complaint on file.   Subjective:  HPI Gregory [name] is a 59 y.o. male presents to [facility] Respiratory clinic for evaluation shortness of breath, cough, and wheezing of symptoms ongoing for 1 month. Patient had COVID-19 back in October and reports recovery from symptoms following initial infection. Over the last month, he endorses intermittent shortness of breath with an inability to inhale completely. He has a cough and intermittent wheezing. He had a telemedicine encounter with his PCP and was started on Breo, course of prednisone, cough medication, and furosemide. Patients medical history if significant for CHF, renal cell carcinoma resulting in nephrectomy of right kidney. Due to concern for worsening renal function, patient discontinued taking Furosemide. He had a recent telemedicine visit with cardiology and is scheduled for a echocardiogram in April. Last known EF 35%-40% as of 11/03/2018 which had declined from prior 50-55% noted on Echo 04/2017. He has had no known COVID-19 exposures.   Sinus congestion   Review of Systems Pertinent negatives listed in HPI  Patient Active Problem List   Diagnosis Date Noted  . Wheezing 07/06/2019  . Acute non-recurrent maxillary sinusitis 09/14/2018  . Vitamin B12 deficiency neuropathy ([place]) 07/29/2018  . Vitamin D deficiency disease 07/29/2018  . Hyperlipidemia LDL goal <70 07/29/2018  . Encounter for antineoplastic immunotherapy 03/14/2017  . Iron deficiency anemia 03/14/2017  . Manifestations of thiamine deficiency 02/20/2017  . PSA elevation 02/17/2017  . Other dietary vitamin B12 deficiency anemia 02/17/2017  . Chronic systolic CHF (congestive heart failure) ([place]) 01/24/2017  . Essential hypertension 01/23/2017  . IDDM (insulin dependent diabetes mellitus) 01/13/2017  . Primary malignant neoplasm of kidney with metastasis from  kidney to other site [facility]) 05/29/2016  . Obesity (BMI 30-39.9) 08/22/2011  . Routine health maintenance 08/22/2011  . Pure hyperglyceridemia 06/21/2007  . Depression with anxiety 06/21/2007  . MACULAR DEGENERATION, RIGHT EYE 06/21/2007  . Malignant essential hypertension with congestive heart failure ([place]) 06/21/2007  . Allergic rhinosinusitis 06/21/2007      Prior to Admission medications   Medication Sig Start Date End Date Taking? Authorizing Provider  atorvastatin (LIPITOR) 20 MG tablet TAKE 1 TABLET BY MOUTH EVERY NIGHT AT BEDTIME 01/31/19  Yes [name], MD  blood glucose meter kit and supplies KIT Dispense based on patient and insurance preference. Use up to four times daily as directed. (FOR ICD-9 250.00, 250.01). 01/23/17  Yes Tanner, Lyndon Code., PA-C  carvedilol (COREG) 6.25 MG tablet Take 1.5 tablets (9.375 mg total) by mouth 2 (two) times daily. 03/19/19  Yes Bensimhon, Shaune Pascal, MD  cetirizine (ZYRTEC) 10 MG tablet Take 10 mg by mouth daily.   Yes [provider]  Cholecalciferol 50 MCG (2000 UT) TABS Take 2 tablets (4,000 Units total) by mouth daily. 08/03/18  Yes [name], MD  Continuous Blood Gluc Sensor (FREESTYLE LIBRE 14 DAY SENSOR) MISC U UTD 02/23/19  Yes [provider]  fluticasone furoate-vilanterol (BREO ELLIPTA) 100-25 MCG/INH AEPB Inhale 1 puff into the lungs daily. 07/06/19  Yes Plotnikov, Evie Lacks, MD  insulin aspart (NOVOLOG FLEXPEN) 100 UNIT/ML FlexPen Add to meal coverage Sliding scale CBG 70 - 120: 0 units CBG 121 - 150: 1 unit,  CBG 151 - 200: 2 units,  CBG 201 - 250: 3 units,  CBG 251 - 300: 5 units,  CBG 301 - 350: 7 units,  CBG 351 - 400: 9 units   CBG > 400: 9 units and notify your MD Patient taking differently: Inject 7-18 Units into the skin 3 (three) times daily with meals. Per sliding scale Add to meal coverage Sliding scale CBG 70 - 120: 0 units CBG 121 - 150: 1 unit,  CBG 151 - 200: 2 units,  CBG 201 - 250: 3 units,  CBG 251 -  300: 5 units,  CBG 301 - 350: 7 units,  CBG 351 - 400: 9 units   CBG > 400: 9 units and notify your MD 01/26/17  Yes Rai, Ripudeep K, MD  levothyroxine (SYNTHROID, LEVOTHROID) 25 MCG tablet TAKE 1 TABLET ONCE DAILY BEFORE BREAKFAST 06/17/17  Yes Curcio, Roselie Awkward, NP  methylPREDNISolone (MEDROL DOSEPAK) 4 MG TBPK tablet As directed 07/12/19  Yes Plotnikov, Evie Lacks, MD  OZEMPIC, 0.25 OR 0.5 MG/DOSE, 2 MG/1.5ML SOPN  01/28/19  Yes [provider]  PARoxetine (PAXIL) 30 MG tablet Take 1 tablet (30 mg total) by mouth daily. 06/12/19  Yes [name], MD  potassium chloride SA (KLOR-CON) 20 MEQ tablet Take 20 meq (1 tab) with every dose of Furosemide 07/14/19  Yes Bensimhon, Shaune Pascal, MD  promethazine-codeine (PHENERGAN WITH CODEINE) 6.25-10 MG/5ML syrup Take 5 mLs by mouth every 6 (six) hours as needed for cough. 07/06/19  Yes Plotnikov, Evie Lacks, MD  sacubitril-valsartan (ENTRESTO) 49-51 MG Take 1 tablet by mouth 2 (two) times daily. 05/24/19  Yes Bensimhon, Shaune Pascal, MD  thiamine (VITAMIN B-1) 100 MG tablet Take 1 tablet (100 mg total) by mouth daily. 02/20/17  Yes [name], MD  furosemide (LASIX) 40 MG tablet Take 1 tablet (40 mg total) by mouth every Monday, Wednesday, and Friday. Patient not taking: Reported on 07/26/2019 07/14/19 07/13/20  Bensimhon, Shaune Pascal, MD    Past Medical, Surgical Family and Social History reviewed and updated.    Objective:   Today's Vitals   07/26/19 1747  BP: (!) 132/98  Pulse: 90  Temp: 98.2 F (36.8 C)  TempSrc: Oral  SpO2: 99%  Weight: 277 lb (125.6 kg)    Wt Readings from Last 3 Encounters:  07/26/19 277 lb (125.6 kg)  03/19/19 277 lb 3.2 oz (125.7 kg)  12/21/18 283 lb 1.6 oz (128.4 kg)   Physical Exam General appearance: alert, well developed, well nourished, cooperative and in no distress Head: Normocephalic, without obvious abnormality, atraumatic Respiratory: Respirations even and unlabored, normal respiratory rate, shortened airway  entry with deep inhalation Heart: rate and rhythm normal. No gallop or murmurs noted on exam  Abdomen: BS +, no distention, no rebound tenderness, or no mass Extremities: No gross deformities Skin: Skin color, texture, turgor normal. No rashes seen  Psych: Appropriate mood and affect. Neurologic: Alert, oriented to person, place, and time, thought content appropriate.   Assessment & Plan:  1. History of 2019 novel coronavirus disease (COVID-19) - Novel Coronavirus, NAA ([facility])  2. SOB (shortness of breath) Suspect this is secondary to chronic heart disease, most recent Echo revealed EF 35-40% 10/2018.  Echo pending for April 2021. Pt followed closely by PCP and cardiology. Obtained a COVID test although no known exposure. Chest x-ray is normal. Pt is not followed by pulmonology and agreed to pulmonology referral to have PFT completed given dyspnea symptoms and inability to inhale at maximum capacity.  - DG Chest Portable 2 Views; Future - Novel Coronavirus, NAA ([facility])  3. Chronic systolic CHF (congestive heart failure) ([place]) Complicated by current SOB,  last ECHO significant for reduced EF 35-40% Cardiologist ordered a repeat study scheduled for April 2021 Currently not taking prescribed diuretic due to concern for renal function given he only has one kidney. Will repeat Comprehensive metabolic panel, evaluate renal function  Patient will continue to follow-up closely with cardiology.     -The patient was given clear instructions to go to ER or return to medical center if symptoms do not improve, worsen or new problems develop. The patient verbalized understanding.     Molli Barrows, FNP-C [facility], PRN Provider  [facility]. [place], [place] [facility] Phone: [phone] Clinic Fax: [phone] Clinic Hours: 5:30 pm -7:30 pm (Monday-Friday)

## 2019-07-27 LAB — COMPREHENSIVE METABOLIC PANEL
ALT: 50 IU/L — ABNORMAL HIGH (ref 0–44)
AST: 49 IU/L — ABNORMAL HIGH (ref 0–40)
Albumin/Globulin Ratio: 1.7 (ref 1.2–2.2)
Albumin: 4.3 g/dL (ref 3.8–4.9)
Alkaline Phosphatase: 120 IU/L — ABNORMAL HIGH (ref 39–117)
BUN/Creatinine Ratio: 16 (ref 9–20)
BUN: 23 mg/dL (ref 6–24)
Bilirubin Total: 0.4 mg/dL (ref 0.0–1.2)
CO2: 23 mmol/L (ref 20–29)
Calcium: 9.4 mg/dL (ref 8.7–10.2)
Chloride: 103 mmol/L (ref 96–106)
Creatinine, Ser: 1.4 mg/dL — ABNORMAL HIGH (ref 0.76–1.27)
GFR calc Af Amer: 63 mL/min/{1.73_m2} (ref >59)
GFR calc non Af Amer: 55 mL/min/{1.73_m2} — ABNORMAL LOW (ref >59)
Globulin, Total: 2.6 g/dL (ref 1.5–4.5)
Glucose: 224 mg/dL — ABNORMAL HIGH (ref 65–99)
Potassium: 4.9 mmol/L (ref 3.5–5.2)
Sodium: 141 mmol/L (ref 134–144)
Total Protein: 6.9 g/dL (ref 6.0–8.5)

## 2019-07-28 ENCOUNTER — Telehealth (HOSPITAL_COMMUNITY): Payer: Self-pay

## 2019-07-28 LAB — NOVEL CORONAVIRUS, NAA: SARS-CoV-2, NAA: NOT DETECTED

## 2019-07-28 NOTE — Telephone Encounter (Signed)
Sister called with concerns about patient. Reports pt has increased shortness of breath, is pale and "foggy". States pt has been seen virtually but she feels the patient needs to be seen in person. Reviewed pt chart. Pt recently stopped furosemide himself. Reviewed recent lab work from 3/1. Had patient take prescribed dose of Furosemide and worked in appt for 3/4. Told sister to have pt to report to ED if symptoms get worse.

## 2019-07-29 ENCOUNTER — Ambulatory Visit (HOSPITAL_COMMUNITY)
Admission: RE | Admit: 2019-07-29 | Discharge: 2019-07-29 | Disposition: A | Discharge status: Home / Self Care | Payer: BC Managed Care – PPO | Source: Ambulatory Visit | Attending: Internal Medicine | Admitting: Internal Medicine

## 2019-07-29 ENCOUNTER — Other Ambulatory Visit: Payer: Self-pay

## 2019-07-29 ENCOUNTER — Encounter (HOSPITAL_COMMUNITY): Payer: Self-pay | Admitting: Internal Medicine

## 2019-07-29 ENCOUNTER — Other Ambulatory Visit: Payer: Self-pay | Admitting: Cardiology

## 2019-07-29 ENCOUNTER — Ambulatory Visit (HOSPITAL_BASED_OUTPATIENT_CLINIC_OR_DEPARTMENT_OTHER)
Admission: RE | Admit: 2019-07-29 | Discharge: 2019-07-29 | Disposition: A | Discharge status: Home / Self Care | Payer: BC Managed Care – PPO | Source: Ambulatory Visit | Attending: Internal Medicine | Admitting: Internal Medicine

## 2019-07-29 VITALS — BP 130/86 | HR 101 | Wt 279.8 lb

## 2019-07-29 DIAGNOSIS — Z79899 Other long term (current) drug therapy: Secondary | ICD-10-CM | POA: Diagnosis not present

## 2019-07-29 DIAGNOSIS — E781 Pure hyperglyceridemia: Secondary | ICD-10-CM | POA: Diagnosis not present

## 2019-07-29 DIAGNOSIS — E875 Hyperkalemia: Secondary | ICD-10-CM | POA: Diagnosis not present

## 2019-07-29 DIAGNOSIS — I110 Hypertensive heart disease with heart failure: Secondary | ICD-10-CM | POA: Diagnosis not present

## 2019-07-29 DIAGNOSIS — Z8616 Personal history of COVID-19: Secondary | ICD-10-CM | POA: Insufficient documentation

## 2019-07-29 DIAGNOSIS — R05 Cough: Secondary | ICD-10-CM | POA: Insufficient documentation

## 2019-07-29 DIAGNOSIS — E1040 Type 1 diabetes mellitus with diabetic neuropathy, unspecified: Secondary | ICD-10-CM | POA: Diagnosis not present

## 2019-07-29 DIAGNOSIS — E785 Hyperlipidemia, unspecified: Secondary | ICD-10-CM | POA: Insufficient documentation

## 2019-07-29 DIAGNOSIS — I5023 Acute on chronic systolic (congestive) heart failure: Secondary | ICD-10-CM | POA: Diagnosis not present

## 2019-07-29 DIAGNOSIS — Z794 Long term (current) use of insulin: Secondary | ICD-10-CM | POA: Insufficient documentation

## 2019-07-29 DIAGNOSIS — I083 Combined rheumatic disorders of mitral, aortic and tricuspid valves: Secondary | ICD-10-CM | POA: Insufficient documentation

## 2019-07-29 DIAGNOSIS — F419 Anxiety disorder, unspecified: Secondary | ICD-10-CM | POA: Insufficient documentation

## 2019-07-29 DIAGNOSIS — E669 Obesity, unspecified: Secondary | ICD-10-CM | POA: Diagnosis not present

## 2019-07-29 DIAGNOSIS — I10 Essential (primary) hypertension: Secondary | ICD-10-CM

## 2019-07-29 DIAGNOSIS — I5022 Chronic systolic (congestive) heart failure: Secondary | ICD-10-CM

## 2019-07-29 DIAGNOSIS — I2510 Atherosclerotic heart disease of native coronary artery without angina pectoris: Secondary | ICD-10-CM | POA: Diagnosis not present

## 2019-07-29 DIAGNOSIS — Z8249 Family history of ischemic heart disease and other diseases of the circulatory system: Secondary | ICD-10-CM | POA: Insufficient documentation

## 2019-07-29 DIAGNOSIS — R0602 Shortness of breath: Secondary | ICD-10-CM | POA: Insufficient documentation

## 2019-07-29 DIAGNOSIS — I420 Dilated cardiomyopathy: Secondary | ICD-10-CM | POA: Insufficient documentation

## 2019-07-29 LAB — BASIC METABOLIC PANEL
Anion gap: 11 (ref 5–15)
BUN: 21 mg/dL — ABNORMAL HIGH (ref 6–20)
CO2: 22 mmol/L (ref 22–32)
Calcium: 8.8 mg/dL — ABNORMAL LOW (ref 8.9–10.3)
Chloride: 108 mmol/L (ref 98–111)
Creatinine, Ser: 1.43 mg/dL — ABNORMAL HIGH (ref 0.61–1.24)
GFR calc Af Amer: >60 mL/min (ref >60)
GFR calc non Af Amer: 53 mL/min — ABNORMAL LOW (ref >60)
Glucose, Bld: 182 mg/dL — ABNORMAL HIGH (ref 70–99)
Potassium: 4.1 mmol/L (ref 3.5–5.1)
Sodium: 141 mmol/L (ref 135–145)

## 2019-07-29 LAB — BRAIN NATRIURETIC PEPTIDE: B Natriuretic Peptide: 2151.2 pg/mL — ABNORMAL HIGH (ref 0.0–100.0)

## 2019-07-29 LAB — CBC
HCT: 42.3 % (ref 39.0–52.0)
Hemoglobin: 13.2 g/dL (ref 13.0–17.0)
MCH: 28.4 pg (ref 26.0–34.0)
MCHC: 31.2 g/dL (ref 30.0–36.0)
MCV: 91.2 fL (ref 80.0–100.0)
Platelets: 221 10*3/uL (ref 150–400)
RBC: 4.64 MIL/uL (ref 4.22–5.81)
RDW: 12.9 % (ref 11.5–15.5)
WBC: 7.6 10*3/uL (ref 4.0–10.5)
nRBC: 0 % (ref 0.0–0.2)

## 2019-07-29 LAB — ECHOCARDIOGRAM COMPLETE: Weight: 4476.8 oz

## 2019-07-29 MED ORDER — DIGOXIN 125 MCG PO TABS: 125.0000 ug | ORAL_TABLET | Freq: Every day | ORAL | 3 refills | Status: DC

## 2019-07-29 MED ORDER — FUROSEMIDE 40 MG PO TABS: 40.0000 mg | ORAL_TABLET | Freq: Every day | ORAL | 2 refills | Status: DC

## 2019-07-29 NOTE — Progress Notes (Signed)
Advanced Heart Failure Clinic Note   Date:  07/29/2019   ID:  Gregory [name], DOB 1960-10-10, MRN [id number]  Location: Home  Provider location: [place] [facility] Type of Visit: Established patient  PCP:  [name], MD  Cardiologist:  No primary care provider on file. Primary HF: Petra Dumler  Chief Complaint: Heart Failure follow-up   History of Present Illness:  Gregory [name] is a 59 y/o male with h/o obesity, metastatic R renal cell CA s/p R nephrectomy in 9/17 with the subsequent discovery of 2 pulmonary nodules whom we follow for non-ischemic cardiomyopathy.  Developed microscopic hematuria and found to have large right renal mass c/w renal cell CA. Eventually underwent right radical nephrectomy with adrenal sparing on 02/07/16 at [facility]. Post-op developed bradycardia with HRs in 30s while sleeping. BP stable. When awakened had dizziness so given atropine. ECG revealed sinus brady in 40s with mild QT prolongation which resolved. Echo done and EF 45% with global HK. RV dilated  With normal function .  We saw him for the first time in October 2017. At that time we ordered cMRI and stress test. However in the interim he went back to [facility]. Found to have 2 metastatic lung nodules Echo showed EF at 46%. However MUGA with EF 28% so referred back to [place] for further evaluation prior to chemo. Underwent cath 04/09/16 as below. Had chemo and lung nodule resection.   1. 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 Otherwise normal coronaries 3. Nonischemic, dilated cardiomyopathy with EF 30-35% and global hypokinesis  cMRI 05/29/16 EF 28% No LGE  Had sleep study which was negative.   We had a telehealth visit about 2 weeks ago for  further evaluation for SOB and cough. Had COVID 03/19/19. Post-COVID seen at [facility] and had CT with ground glass changes felt to be post-COVID changes. Saw Dr.  Alain Marion on 2/15 c/o waking up with SOB. We increased lasix to 40 MWF but hasn't been taking - just took one pill yesterday. Now more SOB walking around. No CP. + orthopnea and PND. Scheduled for echo but hasn't had it yet   Echos:    Echo 5/20 EF 35-40% ECHO 09/12/16 35-40%, RV ok. - Reviewed personally  Echo 05/22/17: EF 50-55%, grade 1 DD.    [name] denies symptoms worrisome for COVID 19.   Past Medical History:  Diagnosis Date  . Allergy   . Anemia    low iron  . Anxiety   . Cancer [facility])    Renal cell cancer  . Complex tear of lateral meniscus of right knee as current injury 03/20/2017  . Complex tear of medial meniscus of right knee 03/20/2017  . Diabetes mellitus without complication ([place])   . History of gastrointestinal hemorrhage   . Hypertension   . Hypertriglyceridemia   . Macular degeneration    lasar surgery- Dr Zigmund Quanetta Truss  . Neuropathy   . Nonischemic cardiomyopathy ([place])    EF 25-30% by echo 09/12/16  . Plantar warts   . PONV (postoperative nausea and vomiting)    " a little bit of nausea"  . Primary localized osteoarthritis of right knee 03/20/2017  . Renal cell carcinoma [facility])    s/p nephrectomy 02/07/16 at [facility])   Past Surgical History:  Procedure Laterality Date  . CARDIAC CATHETERIZATION N/A 04/19/2016   Procedure: Left Heart Cath and Coronary Angiography;  Surgeon: Jolaine Artist, MD;  Location: [place] CV LAB;  Service: Cardiovascular;  Laterality: N/A;  . CHONDROPLASTY Right 03/20/2017   Procedure: CHONDROPLASTY;  Surgeon: Marchia Bond, MD;  Location: [place];  Service: Orthopedics;  Laterality: Right;  . COLONOSCOPY  03/22/2005  . EYE SURGERY Right    laser surgery, right eye macular degeneration  . KNEE ARTHROSCOPY Right    x 2  . KNEE ARTHROSCOPY WITH MEDIAL MENISECTOMY Right 03/20/2017   Procedure: KNEE ARTHROSCOPY WITH MEDIAL AND LATERAL MENISECTOMY;  Surgeon: Marchia Bond, MD;  Location: [place];  Service: Orthopedics;   Laterality: Right;  . NEPHRECTOMY Right   . orif fx fibula    . sebaceous cyst excision  1985  . TONSILLECTOMY    . TOTAL SHOULDER ARTHROPLASTY Left 11/11/2014   Procedure: LEFT TOTAL SHOULDER ARTHROPLASTY;  Surgeon: Netta Cedars, MD;  Location: [place];  Service: Orthopedics;  Laterality: Left;  Marland Kitchen VASECTOMY       Current Outpatient Medications  Medication Sig Dispense Refill  . atorvastatin (LIPITOR) 20 MG tablet TAKE 1 TABLET BY MOUTH EVERY NIGHT AT BEDTIME 90 tablet 1  . blood glucose meter kit and supplies KIT Dispense based on patient and insurance preference. Use up to four times daily as directed. (FOR ICD-9 250.00, 250.01). 1 each 0  . carvedilol (COREG) 6.25 MG tablet Take 1.5 tablets (9.375 mg total) by mouth 2 (two) times daily. 270 tablet 3  . cetirizine (ZYRTEC) 10 MG tablet Take 10 mg by mouth daily.    . Cholecalciferol 50 MCG (2000 UT) TABS Take 2 tablets (4,000 Units total) by mouth daily. 180 tablet 1  . Continuous Blood Gluc Sensor (FREESTYLE LIBRE 14 DAY SENSOR) MISC U UTD    . fluticasone furoate-vilanterol (BREO ELLIPTA) 100-25 MCG/INH AEPB Inhale 1 puff into the lungs daily. 1 each 5  . furosemide (LASIX) 40 MG tablet Take 1 tablet (40 mg total) by mouth every Monday, Wednesday, and Friday. 45 tablet 0  . insulin aspart (NOVOLOG FLEXPEN) 100 UNIT/ML FlexPen Add to meal coverage Sliding scale CBG 70 - 120: 0 units CBG 121 - 150: 1 unit,  CBG 151 - 200: 2 units,  CBG 201 - 250: 3 units,  CBG 251 - 300: 5 units,  CBG 301 - 350: 7 units,  CBG 351 - 400: 9 units   CBG > 400: 9 units and notify your MD (Patient taking differently: Inject 7-18 Units into the skin 3 (three) times daily with meals. Per sliding scale Add to meal coverage Sliding scale CBG 70 - 120: 0 units CBG 121 - 150: 1 unit,  CBG 151 - 200: 2 units,  CBG 201 - 250: 3 units,  CBG 251 - 300: 5 units,  CBG 301 - 350: 7 units,  CBG 351 - 400: 9 units   CBG > 400: 9 units and notify your MD) 15 mL 11  . levothyroxine  (SYNTHROID, LEVOTHROID) 25 MCG tablet TAKE 1 TABLET ONCE DAILY BEFORE BREAKFAST 30 tablet 0  . OZEMPIC, 0.25 OR 0.5 MG/DOSE, 2 MG/1.5ML SOPN Inject 1 mg into the skin once a week.     Marland Kitchen PARoxetine (PAXIL) 30 MG tablet Take 1 tablet (30 mg total) by mouth daily. 90 tablet 0  . potassium chloride SA (KLOR-CON) 20 MEQ tablet Take 20 meq (1 tab) with every dose of Furosemide 90 tablet 3  . promethazine-codeine (PHENERGAN WITH CODEINE) 6.25-10 MG/5ML syrup Take 5 mLs by mouth every 6 (six) hours as needed  for cough. 300 mL 0  . sacubitril-valsartan (ENTRESTO) 49-51 MG Take 1 tablet by mouth 2 (two) times daily. 180 tablet 3  . thiamine (VITAMIN B-1) 100 MG tablet Take 1 tablet (100 mg total) by mouth daily. 90 tablet 1   Current Facility-Administered Medications  Medication Dose Route Frequency Provider Last Rate Last Admin  . albuterol (VENTOLIN HFA) 108 (90 Base) MCG/ACT inhaler 2 puff  2 puff Inhalation Q4H PRN Scot Jun, FNP        Allergies:   Patient has no known allergies.   Social History:  The patient  reports that he has never smoked. He has never used smokeless tobacco. He reports current alcohol use. He reports that he does not use drugs.   Family History:  The patient's family history includes Bipolar disorder in his mother; Cancer (age of onset: 63) in his mother; Hypertension in his mother; Other in his father.   ROS:  Please see the history of present illness.   All other systems are personally reviewed and negative.   Vitals:   07/29/19 1033  BP: 130/86  Pulse: (!) 101  SpO2: 99%  Weight: 126.9 kg (279 lb 12.8 oz)    Exam:   General:  SOB with exertion HEENT: normal Neck: supple.JVP 7 . Carotids 2+ bilat; no bruits. No lymphadenopathy or thryomegaly appreciated. Cor: PMI nondisplaced. Regular tachy. +s3 Lungs: clear Abdomen: obese soft, nontender, nondistended. No hepatosplenomegaly. No bruits or masses. Good bowel sounds. Extremities: no cyanosis, clubbing,  rash,1+  edema Neuro: alert & orientedx3, cranial nerves grossly intact. moves all 4 extremities w/o difficulty. Affect pleasant  ECG: Sinus tach 105 with PVCs. Personally reviewed   Recent Labs: 03/19/2019: Hemoglobin 15.1; Platelets 161 07/26/2019: ALT 50; BUN 23; Creatinine, Ser 1.40; Potassium 4.9; Sodium 141  Personally reviewed   Wt Readings from Last 3 Encounters:  07/29/19 126.9 kg (279 lb 12.8 oz)  07/26/19 125.6 kg (277 lb)  03/19/19 125.7 kg (277 lb 3.2 oz)      ASSESSMENT AND PLAN:  1. Acute on chronic systolic HF due to NICM   - Unclear etiology. EF 30-35% by cath with NICM. cMRI 1/18 with EF 28%. Etiology remains unclear. No evidence of scar of infiltrative process on MRI. Echo 4/18 EF 35-40%. Echo 04/2017 EF 50-55% -  Much worse today. NYHA III-IIIb - Volume status up on exam and by history. With prominent tachycardia and recent COVID  I worry EF may be down further. Also exclude effusion.  - Increase lasix to 40 bid x 2 days then 40 daily (he has not been taking regularly due to fear of hurting his solitary kidney) - Check ReDS and echo today -  Continue carvedilol to 9.375 bid -  Continue Entresto 49/51 mg bid.  - Unable to take spiro due to hyperkalemia - No SGLT2i with Type I DM  3. Metastatic Renal Cell CA with lung nodules - s/p nephrectomy. Currently stable. Followed in [facility]  4. HTN:  - Blood pressure well controlled. Continue current regimen.  5. Snoring - Recent sleep study negative.   6. CAD - 1v in small RCA. No s/s ischemia Agree with low-dose ASA and statin  7. Type I DM - Followed by Dr. Buddy Duty   Addendum: Echo back EF 10-15% RV moderately down. Add digoxin 0.125. See back next week    Signed, Glori Bickers, MD  07/29/2019 10:56 AM  Advanced Heart Failure [place] [address] Heart and  Vascular [place] [address] [phone] (office) [phone] (fax)

## 2019-07-29 NOTE — Patient Instructions (Addendum)
Increase Furosemide to 40 mg Twice daily FOR 2 DAYS ONLY then take 40 mg DAILY  Start Digoxin 0.125 mg daily  Labs done today, your results will be available in MyChart, we will contact you for abnormal readings.  Your physician recommends that you schedule a follow-up appointment in: 2 weeks  If you have any questions or concerns before your next appointment please send [place] a message through [place] or call our office at [phone].  At the [facility], you and your health needs are our priority. As part of our continuing mission to provide you with exceptional heart care, we have created designated Provider Care Teams. These Care Teams include your primary Cardiologist (physician) and Advanced Practice Providers (APPs- Physician Assistants and Nurse Practitioners) who all work together to provide you with the care you need, when you need it.   You may see any of the following providers on your designated Care Team at your next follow up: Marland Kitchen Dr Glori Bickers . Dr Loralie Champagne . Darrick Grinder, NP . Lyda Jester, PA . Audry Riles, PharmD   Please be sure to bring in all your medications bottles to every appointment.

## 2019-07-29 NOTE — Progress Notes (Signed)
  Echocardiogram 2D Echocardiogram has been performed.  Analie Katzman A Odella Appelhans 07/29/2019, 12:10 PM

## 2019-08-05 ENCOUNTER — Encounter (HOSPITAL_COMMUNITY): Payer: Self-pay | Admitting: Internal Medicine

## 2019-08-05 ENCOUNTER — Other Ambulatory Visit: Payer: Self-pay

## 2019-08-05 ENCOUNTER — Ambulatory Visit (HOSPITAL_COMMUNITY)
Admission: RE | Admit: 2019-08-05 | Discharge: 2019-08-05 | Disposition: A | Discharge status: Home / Self Care | Payer: BC Managed Care – PPO | Source: Ambulatory Visit | Attending: Internal Medicine | Admitting: Internal Medicine

## 2019-08-05 VITALS — BP 122/82 | HR 95 | Wt 273.0 lb

## 2019-08-05 DIAGNOSIS — I2510 Atherosclerotic heart disease of native coronary artery without angina pectoris: Secondary | ICD-10-CM | POA: Diagnosis not present

## 2019-08-05 DIAGNOSIS — I10 Essential (primary) hypertension: Secondary | ICD-10-CM | POA: Diagnosis not present

## 2019-08-05 DIAGNOSIS — Z85528 Personal history of other malignant neoplasm of kidney: Secondary | ICD-10-CM | POA: Insufficient documentation

## 2019-08-05 DIAGNOSIS — I5023 Acute on chronic systolic (congestive) heart failure: Secondary | ICD-10-CM | POA: Insufficient documentation

## 2019-08-05 DIAGNOSIS — E669 Obesity, unspecified: Secondary | ICD-10-CM | POA: Insufficient documentation

## 2019-08-05 DIAGNOSIS — R918 Other nonspecific abnormal finding of lung field: Secondary | ICD-10-CM | POA: Insufficient documentation

## 2019-08-05 DIAGNOSIS — E1040 Type 1 diabetes mellitus with diabetic neuropathy, unspecified: Secondary | ICD-10-CM | POA: Diagnosis not present

## 2019-08-05 DIAGNOSIS — Z8616 Personal history of COVID-19: Secondary | ICD-10-CM | POA: Insufficient documentation

## 2019-08-05 DIAGNOSIS — I255 Ischemic cardiomyopathy: Secondary | ICD-10-CM | POA: Diagnosis not present

## 2019-08-05 DIAGNOSIS — Z8249 Family history of ischemic heart disease and other diseases of the circulatory system: Secondary | ICD-10-CM | POA: Diagnosis not present

## 2019-08-05 DIAGNOSIS — Z905 Acquired absence of kidney: Secondary | ICD-10-CM | POA: Insufficient documentation

## 2019-08-05 DIAGNOSIS — I110 Hypertensive heart disease with heart failure: Secondary | ICD-10-CM | POA: Diagnosis not present

## 2019-08-05 DIAGNOSIS — F419 Anxiety disorder, unspecified: Secondary | ICD-10-CM | POA: Diagnosis not present

## 2019-08-05 DIAGNOSIS — Z794 Long term (current) use of insulin: Secondary | ICD-10-CM | POA: Insufficient documentation

## 2019-08-05 DIAGNOSIS — Z7951 Long term (current) use of inhaled steroids: Secondary | ICD-10-CM | POA: Insufficient documentation

## 2019-08-05 DIAGNOSIS — I5022 Chronic systolic (congestive) heart failure: Secondary | ICD-10-CM | POA: Diagnosis not present

## 2019-08-05 DIAGNOSIS — E781 Pure hyperglyceridemia: Secondary | ICD-10-CM | POA: Insufficient documentation

## 2019-08-05 DIAGNOSIS — R0683 Snoring: Secondary | ICD-10-CM | POA: Diagnosis not present

## 2019-08-05 DIAGNOSIS — I420 Dilated cardiomyopathy: Secondary | ICD-10-CM | POA: Insufficient documentation

## 2019-08-05 DIAGNOSIS — Z79899 Other long term (current) drug therapy: Secondary | ICD-10-CM | POA: Insufficient documentation

## 2019-08-05 LAB — BASIC METABOLIC PANEL
Anion gap: 10 (ref 5–15)
BUN: 21 mg/dL — ABNORMAL HIGH (ref 6–20)
CO2: 28 mmol/L (ref 22–32)
Calcium: 8.9 mg/dL (ref 8.9–10.3)
Chloride: 98 mmol/L (ref 98–111)
Creatinine, Ser: 1.3 mg/dL — ABNORMAL HIGH (ref 0.61–1.24)
GFR calc Af Amer: >60 mL/min (ref >60)
GFR calc non Af Amer: 60 mL/min — ABNORMAL LOW (ref >60)
Glucose, Bld: 276 mg/dL — ABNORMAL HIGH (ref 70–99)
Potassium: 4 mmol/L (ref 3.5–5.1)
Sodium: 136 mmol/L (ref 135–145)

## 2019-08-05 LAB — BRAIN NATRIURETIC PEPTIDE: B Natriuretic Peptide: 540.4 pg/mL — ABNORMAL HIGH (ref 0.0–100.0)

## 2019-08-05 MED ORDER — ENTRESTO 97-103 MG PO TABS: 1.0000 | ORAL_TABLET | Freq: Two times a day (BID) | ORAL | 3 refills | Status: DC

## 2019-08-05 NOTE — Progress Notes (Signed)
Advanced Heart Failure Clinic Note   Date:  08/05/2019   ID:  Gregory [name], DOB 07-31-1960, MRN [id number]  Location: Home  Provider location: [place] [facility] Type of Visit: Established patient  PCP:  [name], MD  Cardiologist:  No primary care provider on file. Primary HF: [name]  Chief Complaint: Heart Failure follow-up   History of Present Illness:  Gregory [name] is a 59 y/o male with h/o obesity, metastatic R renal cell CA s/p R nephrectomy in 9/17 with the subsequent discovery of 2 pulmonary nodules whom we follow for non-ischemic cardiomyopathy.  Developed microscopic hematuria and found to have large right renal mass c/w renal cell CA. Eventually underwent right radical nephrectomy with adrenal sparing on 02/07/16 at [facility]. Post-op developed bradycardia with HRs in 30s while sleeping. BP stable. When awakened had dizziness so given atropine. ECG revealed sinus brady in 40s with mild QT prolongation which resolved. Echo done and EF 45% with global HK. RV dilated  With normal function .  We saw him for the first time in October 2017. At that time we ordered cMRI and stress test. However in the interim he went back to [facility]. Found to have 2 metastatic lung nodules Echo showed EF at 46%. However MUGA with EF 28% so referred back to [place] for further evaluation prior to chemo. Underwent cath 04/09/16 as below. Had chemo and lung nodule resection.   1. 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 Otherwise normal coronaries 3. Nonischemic, dilated cardiomyopathy with EF 30-35% and global hypokinesis  cMRI 05/29/16 EF 28% No LGE  Had sleep study which was negative.   Had COVID 03/19/19. Post-COVID seen at [facility] and had CT with ground glass changes felt to be post-COVID changes. Subsequently developed HF symptoms. Had echo 07/29/19 EF 10-15% Mild RV dysfunction . We started  digoxin 0.125, lasix 40 daily. Carvedilol 9.375 and Entresto 49/51 continued. Feeling much better. Breathing much better. Orthopnea and PND resolved. Snoring much, much. No ankle edema. Mild DOE with walking.    Echos:    Echo 5/20 EF 35-40% ECHO 09/12/16 35-40%, RV ok. - Reviewed personally  Echo 05/22/17: EF 50-55%, grade 1 DD.     Past Medical History:  Diagnosis Date  . Allergy   . Anemia    low iron  . Anxiety   . Cancer [facility])    Renal cell cancer  . Complex tear of lateral meniscus of right knee as current injury 03/20/2017  . Complex tear of medial meniscus of right knee 03/20/2017  . Diabetes mellitus without complication ([place])   . History of gastrointestinal hemorrhage   . Hypertension   . Hypertriglyceridemia   . Macular degeneration    lasar surgery- Dr Zigmund Jeffrie Stander  . Neuropathy   . Nonischemic cardiomyopathy ([place])    EF 25-30% by echo 09/12/16  . Plantar warts   . PONV (postoperative nausea and vomiting)    " a little bit of nausea"  . Primary localized osteoarthritis of right knee 03/20/2017  . Renal cell carcinoma [facility])    s/p nephrectomy 02/07/16 at [facility])   Past Surgical History:  Procedure Laterality Date  . CARDIAC CATHETERIZATION N/A 04/19/2016   Procedure: Left Heart Cath and Coronary Angiography;  Surgeon: Jolaine Artist, MD;  Location: [place] CV [facility];  Service: Cardiovascular;  Laterality: N/A;  . CHONDROPLASTY Right 03/20/2017   Procedure:  CHONDROPLASTY;  Surgeon: Marchia Bond, MD;  Location: [place];  Service: Orthopedics;  Laterality: Right;  . COLONOSCOPY  03/22/2005  . EYE SURGERY Right    laser surgery, right eye macular degeneration  . KNEE ARTHROSCOPY Right    x 2  . KNEE ARTHROSCOPY WITH MEDIAL MENISECTOMY Right 03/20/2017   Procedure: KNEE ARTHROSCOPY WITH MEDIAL AND LATERAL MENISECTOMY;  Surgeon: Marchia Bond, MD;  Location: [place];  Service: Orthopedics;  Laterality: Right;  . NEPHRECTOMY Right   . orif fx fibula    .  sebaceous cyst excision  1985  . TONSILLECTOMY    . TOTAL SHOULDER ARTHROPLASTY Left 11/11/2014   Procedure: LEFT TOTAL SHOULDER ARTHROPLASTY;  Surgeon: Netta Cedars, MD;  Location: [place];  Service: Orthopedics;  Laterality: Left;  Marland Kitchen VASECTOMY       Current Outpatient Medications  Medication Sig Dispense Refill  . atorvastatin (LIPITOR) 20 MG tablet TAKE 1 TABLET BY MOUTH EVERY NIGHT AT BEDTIME 90 tablet 1  . blood glucose meter kit and supplies KIT Dispense based on patient and insurance preference. Use up to four times daily as directed. (FOR ICD-9 250.00, 250.01). 1 each 0  . carvedilol (COREG) 6.25 MG tablet Take 1.5 tablets (9.375 mg total) by mouth 2 (two) times daily. 270 tablet 3  . cetirizine (ZYRTEC) 10 MG tablet Take 10 mg by mouth daily.    . Cholecalciferol 50 MCG (2000 UT) TABS Take 2 tablets (4,000 Units total) by mouth daily. 180 tablet 1  . Continuous Blood Gluc Sensor (FREESTYLE LIBRE 14 DAY SENSOR) MISC U UTD    . digoxin (LANOXIN) 0.125 MG tablet Take 1 tablet (125 mcg total) by mouth daily. 90 tablet 3  . fluticasone furoate-vilanterol (BREO ELLIPTA) 100-25 MCG/INH AEPB Inhale 1 puff into the lungs daily. 1 each 5  . furosemide (LASIX) 40 MG tablet Take 1 tablet (40 mg total) by mouth daily. 90 tablet 2  . insulin aspart (NOVOLOG FLEXPEN) 100 UNIT/ML FlexPen Add to meal coverage Sliding scale CBG 70 - 120: 0 units CBG 121 - 150: 1 unit,  CBG 151 - 200: 2 units,  CBG 201 - 250: 3 units,  CBG 251 - 300: 5 units,  CBG 301 - 350: 7 units,  CBG 351 - 400: 9 units   CBG > 400: 9 units and notify your MD (Patient taking differently: Inject 7-18 Units into the skin 3 (three) times daily with meals. Per sliding scale Add to meal coverage Sliding scale CBG 70 - 120: 0 units CBG 121 - 150: 1 unit,  CBG 151 - 200: 2 units,  CBG 201 - 250: 3 units,  CBG 251 - 300: 5 units,  CBG 301 - 350: 7 units,  CBG 351 - 400: 9 units   CBG > 400: 9 units and notify your MD) 15 mL 11  . levothyroxine  (SYNTHROID, LEVOTHROID) 25 MCG tablet TAKE 1 TABLET ONCE DAILY BEFORE BREAKFAST 30 tablet 0  . OZEMPIC, 0.25 OR 0.5 MG/DOSE, 2 MG/1.5ML SOPN Inject 1 mg into the skin once a week.     Marland Kitchen PARoxetine (PAXIL) 30 MG tablet Take 1 tablet (30 mg total) by mouth daily. 90 tablet 0  . potassium chloride SA (KLOR-CON) 20 MEQ tablet Take 20 meq (1 tab) with every dose of Furosemide 90 tablet 3  . promethazine-codeine (PHENERGAN WITH CODEINE) 6.25-10 MG/5ML syrup Take 5 mLs by mouth every 6 (six) hours as needed for cough. 300 mL 0  . sacubitril-valsartan (ENTRESTO) 49-51  MG Take 1 tablet by mouth 2 (two) times daily. 180 tablet 3  . thiamine (VITAMIN B-1) 100 MG tablet Take 1 tablet (100 mg total) by mouth daily. 90 tablet 1   Current Facility-Administered Medications  Medication Dose Route Frequency Provider Last Rate Last Admin  . albuterol (VENTOLIN HFA) 108 (90 Base) MCG/ACT inhaler 2 puff  2 puff Inhalation Q4H PRN Scot Jun, FNP        Allergies:   Patient has no known allergies.   Social History:  The patient  reports that he has never smoked. He has never used smokeless tobacco. He reports current alcohol use. He reports that he does not use drugs.   Family History:  The patient's family history includes Bipolar disorder in his mother; Cancer (age of onset: 54) in his mother; Hypertension in his mother; Other in his father.   ROS:  Please see the history of present illness.   All other systems are personally reviewed and negative.   Vitals:   08/05/19 1111  BP: 122/82  Pulse: 95  SpO2: 98%  Weight: 123.8 kg (273 lb)    Exam:   General:  Well appearing. No resp difficulty HEENT: normal Neck: supple. no JVD. Carotids 2+ bilat; no bruits. No lymphadenopathy or thryomegaly appreciated. Cor: PMI nondisplaced. Regular rate & rhythm. No rubs, gallops or murmurs. Lungs: clear Abdomen: obese soft, nontender, nondistended. No hepatosplenomegaly. No bruits or masses. Good bowel  sounds. Extremities: no cyanosis, clubbing, rash, edema Neuro: alert & orientedx3, cranial nerves grossly intact. moves all 4 extremities w/o difficulty. Affect pleasant  Recent Labs: 07/26/2019: ALT 50 07/29/2019: B Natriuretic Peptide 2,151.2; BUN 21; Creatinine, Ser 1.43; Hemoglobin 13.2; Platelets 221; Potassium 4.1; Sodium 141  Personally reviewed   Wt Readings from Last 3 Encounters:  08/05/19 123.8 kg (273 lb)  07/29/19 126.9 kg (279 lb 12.8 oz)  07/26/19 125.6 kg (277 lb)      ASSESSMENT AND PLAN:  1. Acute on chronic systolic HF due to NICM   - Unclear etiology. EF 30-35% by cath with NICM. cMRI 1/18 with EF 28%. Etiology remains unclear. No evidence of scar of infiltrative process on MRI. Echo 4/18 EF 35-40%. Echo 04/2017 EF 50-55% - Echo 07/29/19 EF 10-15% Mild RV - He is improved today after medication titration last week  - Volume status much better - Continue lasix 40 daily - Continue carvedilol to 9.375 bid - Increase Entresto to 97/103 mg bid.  - Unable to take spiro due to hyperkalemia - No SGLT2i with Type I DM - Labs today - Discussed possible need for advanced therapies (VAD) down the road if EF not recovering with medical therapy  3. Metastatic Renal Cell CA with lung nodules - s/p nephrectomy.  - Followed at [facility]  4. HTN:  - Well controlled Changes as abvoe  5. Snoring - 12/17 sleep study negative but has gained significant amount of weight since that time and wife reports snoring is worse - arrange for home sleep study  6. CAD - 1v in small RCA.  - No s/s angina - Continue low-dose ASA and statin  7. Type I DM - Followed by Dr. Buddy Duty   Signed, Glori Bickers, MD  08/05/2019 11:48 AM  Advanced Heart Failure [place] [address] [phone] (office) [phone] (fax)

## 2019-08-10 ENCOUNTER — Other Ambulatory Visit (HOSPITAL_COMMUNITY): Payer: Self-pay

## 2019-08-10 ENCOUNTER — Telehealth (HOSPITAL_COMMUNITY): Payer: Self-pay | Admitting: *Deleted

## 2019-08-10 DIAGNOSIS — E785 Hyperlipidemia, unspecified: Secondary | ICD-10-CM

## 2019-08-10 MED ORDER — ATORVASTATIN CALCIUM 20 MG PO TABS: 20.0000 mg | ORAL_TABLET | Freq: Every day | ORAL | 10 refills | Status: DC

## 2019-08-10 NOTE — Telephone Encounter (Signed)
Patient Name: Gregory [name]          DOB: [birth year]      Height: 6'2     Weight: 273lbs         Referring Provider: Dr.Daniel Bensimhon  STOP BANG RISK ASSESSMENT S (snore) Have you been told that you snore?     YES   T (tired) Are you often tired, fatigued, or sleepy during the day?   YES  O (obstruction) Do you stop breathing, choke, or gasp during sleep? NO   P (pressure) Do you have or are you being treated for high blood pressure? YES   B (BMI) Is your body index greater than 35 kg/m? YES   A (age) Are you 4 years old or older? YES   N (neck) Do you have a neck circumference greater than 16 inches?   YES   G (gender) Are you a male? YES   TOTAL STOP/BANG "YES" ANSWERS 7                                                                       For Office Use Only              Procedure Order Form    YES to 3+ Stop Bang questions OR two clinical symptoms - patient qualifies for WatchPAT (CPT 95800)     Submit: This Form + Patient Face Sheet + Clinical Note via CloudPAT or Fax: [phone]         Clinical Notes: Will consult Sleep Specialist and refer for management of therapy due to patient increased risk of Sleep Apnea. Ordering a sleep study due to the following two clinical symptoms: Excessive daytime sleepiness G47.10 / Gastroesophageal reflux K21.9 / Nocturia R35.1 / Morning Headaches G44.221 / Difficulty concentrating R41.840 / Memory problems or poor judgment G31.84 / Personality changes or irritability R45.4 / Loud snoring R06.83 / Depression F32.9 / Unrefreshed by sleep G47.8 / Impotence N52.9 / History of high blood pressure R03.0 / Insomnia G47.00

## 2019-08-10 NOTE — Telephone Encounter (Signed)
Order, OV note, stop bang and demographics all faxed to [facility] at [phone] via epic  

## 2019-08-16 ENCOUNTER — Encounter (HOSPITAL_COMMUNITY): Payer: Self-pay | Admitting: *Deleted

## 2019-08-18 ENCOUNTER — Inpatient Hospital Stay (HOSPITAL_COMMUNITY): Admission: RE | Admit: 2019-08-18 | Payer: BC Managed Care – PPO | Source: Ambulatory Visit

## 2019-08-18 NOTE — Progress Notes (Signed)
PCP:  [name], MD   Cardiologist:  No primary care provider on file. Primary HF: Bensimhon  HPI:  Gregory [name] is a 59y/o male with h/o obesity, metastatic R renal cell CA s/p R nephrectomy in 9/17 with the subsequent discovery of 2 pulmonary nodules whom we follow for non-ischemic cardiomyopathy.  Developed microscopic hematuria and found to have large right renal mass c/w renal cell CA. Eventually underwent right radical nephrectomy with adrenal sparing on 02/07/16 at [facility]. Post-op developed bradycardia with HRs in 30s while sleeping. BP stable. When awakened had dizziness so given atropine. ECG revealed sinus brady in 40s with mild QT prolongation which resolved. Echo done and EF 45% with global HK. RV dilated With normal function .  We saw him for the first time in October 2017. At that time we ordered cMRI and stress test. However in the interim he went back to [facility]. Found to have 2 metastatic lung nodules Echo showed EF at 46%. However MUGA with EF 28% so referred back to [place] for further evaluation prior to chemo. Underwent cath 04/09/16 as below. Had chemo and lung nodule resection.   1. 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 Otherwise normal coronaries 3. Nonischemic, dilated cardiomyopathy with EF 30-35% and global hypokinesis  cMRI 05/29/16 EF 28% No LGE  Had sleep study which was negative.   Recently presented to HF Clinic for follow up with Dr. Haroldine Laws on 08/05/19. Had COVID 03/19/19. Post-COVID seen at [facility] and had CT with ground glass changes felt to be post-COVID changes. Subsequently developed HF symptoms. Had echo 07/29/19 EF 10-15% Mild RV dysfunction . Was started on digoxin 0.125, lasix 40 daily. Carvedilol 9.375 and Entresto 49/51 continued. Feeling much better. Breathing much better. Orthopnea and PND resolved. Reported snoring a lot. No ankle edema. Mild DOE with walking.    Today he returns to HF clinic for pharmacist medication titration. At last visit with MD, Delene Loll was increased to 97/103 mg BID.  Overall he states he is feeling a lot better. Just got back from vacation. Says he has more stamina and no fatigue. No dizziness, lightheadedness, chest pain or palpitations. No SOB. He does not weigh himself daily at home, but says when he does weigh, his weight is stable. No LEE, PND or orthopnea. His appetite is good. Taking all medications as prescribed. Tolerating all medications.    HF Medications: Carvedilol 9.375 mg BID Entresto 97/103 mg BID Digoxin 0.125 mg daily Furosemide 40 mg daily Potassium chloride 20 mg daily  Has the patient been experiencing any side effects to the medications prescribed?  no  Does the patient have any problems obtaining medications due to transportation or finances?   Has [employer]. I gave him Entresto copay card today.   Understanding of regimen: good Understanding of indications: good Potential of compliance: good Patient understands to avoid NSAIDs. Patient understands to avoid decongestants.    Pertinent Lab Values: . Serum creatinine 1.30, BUN 21, Potassium 4.0, Sodium 136, BNP 540.4   Vital Signs: . Weight: 275.8 lbs (last clinic weight: 273 lbs) . Blood pressure: 125/90 . Heart rate: 77   Assessment: 1. Acute on chronic systolic HF due to NICM  - Unclear etiology. EF 30-35% by cath with NICM. cMRI 1/18 with EF 28%. Etiology remains unclear. No evidence of scar of infiltrative process on MRI. Echo 4/18 EF 35-40%. Echo 04/2017 EF 50-55% - Echo 07/29/19  EF 10-15% Mild RV - Euvolemic on exam.  - Continue furosemide 40 mg daily -Increasecarvedilol to 12.5mg  bid - Continue Entresto 97/103 mg bid. - Unable to take spiro due to hyperkalemia - Continue digoxin 0.125 mg daily. Recommend checking BMET and digoxin level at next visit.  - No SGLT2i with Type I DM - Dr. Haroldine Laws has discussed  possible need for advanced therapies (VAD) down the road if EF not recovering with medical therapy  3. Metastatic Renal Cell CA with lung nodules - s/p nephrectomy.  - Followed at [facility]  4. HTN:  - Well controlled Changes as above  5. Snoring - 12/17 sleep study negative but has gained significant amount of weight since that time and wife reports snoring is worse - arrange for home sleep study  6. CAD - 1v in small RCA. - No s/s angina - Continue low-dose ASA and statin  7. Type I DM - Followed by Dr. Buddy Duty   Plan: 1) Medication changes: Based on clinical presentation, vital signs and recent labs will increase carvedilol to 12.5 mg BID. 2) Labs: Scr 1.30, K 4.0 3) Follow-up: 1 months with Dr. Haroldine Laws.    Audry Riles, PharmD, BCPS, BCCP, CPP Heart Failure Clinic Pharmacist [phone]

## 2019-08-24 LAB — HM DIABETES EYE EXAM

## 2019-08-27 ENCOUNTER — Other Ambulatory Visit (HOSPITAL_COMMUNITY): Payer: BC Managed Care – PPO

## 2019-08-27 ENCOUNTER — Encounter (HOSPITAL_COMMUNITY): Payer: BC Managed Care – PPO | Admitting: Internal Medicine

## 2019-08-31 ENCOUNTER — Ambulatory Visit (HOSPITAL_COMMUNITY)
Admission: RE | Admit: 2019-08-31 | Discharge: 2019-08-31 | Disposition: A | Discharge status: Home / Self Care | Payer: BC Managed Care – PPO | Source: Ambulatory Visit | Attending: Cardiology | Admitting: Cardiology

## 2019-08-31 ENCOUNTER — Other Ambulatory Visit: Payer: Self-pay

## 2019-08-31 VITALS — BP 125/90 | HR 77 | Wt 275.8 lb

## 2019-08-31 DIAGNOSIS — Z7982 Long term (current) use of aspirin: Secondary | ICD-10-CM | POA: Insufficient documentation

## 2019-08-31 DIAGNOSIS — I110 Hypertensive heart disease with heart failure: Secondary | ICD-10-CM | POA: Diagnosis not present

## 2019-08-31 DIAGNOSIS — Z8616 Personal history of COVID-19: Secondary | ICD-10-CM | POA: Diagnosis not present

## 2019-08-31 DIAGNOSIS — Z905 Acquired absence of kidney: Secondary | ICD-10-CM | POA: Diagnosis not present

## 2019-08-31 DIAGNOSIS — I5022 Chronic systolic (congestive) heart failure: Secondary | ICD-10-CM

## 2019-08-31 DIAGNOSIS — E669 Obesity, unspecified: Secondary | ICD-10-CM | POA: Insufficient documentation

## 2019-08-31 DIAGNOSIS — E109 Type 1 diabetes mellitus without complications: Secondary | ICD-10-CM | POA: Diagnosis not present

## 2019-08-31 DIAGNOSIS — R918 Other nonspecific abnormal finding of lung field: Secondary | ICD-10-CM | POA: Insufficient documentation

## 2019-08-31 DIAGNOSIS — I5023 Acute on chronic systolic (congestive) heart failure: Secondary | ICD-10-CM | POA: Insufficient documentation

## 2019-08-31 DIAGNOSIS — I428 Other cardiomyopathies: Secondary | ICD-10-CM | POA: Diagnosis not present

## 2019-08-31 DIAGNOSIS — E039 Hypothyroidism, unspecified: Secondary | ICD-10-CM | POA: Diagnosis not present

## 2019-08-31 DIAGNOSIS — R0683 Snoring: Secondary | ICD-10-CM | POA: Diagnosis not present

## 2019-08-31 DIAGNOSIS — Z85528 Personal history of other malignant neoplasm of kidney: Secondary | ICD-10-CM | POA: Insufficient documentation

## 2019-08-31 DIAGNOSIS — Z79899 Other long term (current) drug therapy: Secondary | ICD-10-CM | POA: Insufficient documentation

## 2019-08-31 DIAGNOSIS — Z794 Long term (current) use of insulin: Secondary | ICD-10-CM | POA: Diagnosis not present

## 2019-08-31 DIAGNOSIS — I2510 Atherosclerotic heart disease of native coronary artery without angina pectoris: Secondary | ICD-10-CM | POA: Insufficient documentation

## 2019-08-31 DIAGNOSIS — E1165 Type 2 diabetes mellitus with hyperglycemia: Secondary | ICD-10-CM | POA: Diagnosis not present

## 2019-08-31 LAB — LIPID PANEL
Cholesterol: 179 (ref 0–200)
HDL: 42 (ref 35–70)
LDL Cholesterol: 105
Triglycerides: 186 — AB (ref 40–160)

## 2019-08-31 LAB — HEMOGLOBIN A1C: Hemoglobin A1C: 8.8

## 2019-08-31 LAB — TSH: TSH: 2.33 (ref 0.41–5.90)

## 2019-08-31 LAB — MICROALBUMIN, URINE: Microalb, Ur: 4.2

## 2019-08-31 MED ORDER — CARVEDILOL 12.5 MG PO TABS: 12.5000 mg | ORAL_TABLET | Freq: Two times a day (BID) | ORAL | 3 refills | Status: DC

## 2019-08-31 NOTE — Patient Instructions (Addendum)
It was a pleasure seeing you today!  MEDICATIONS: -We are changing your medications today -Increase carvedilol to 12.5 mg (1 tablet) twice daily. -Call if you have questions about your medications.  NEXT APPOINTMENT: Return to clinic in 1 month with Dr. Haroldine Laws.  In general, to take care of your heart failure: -Limit your fluid intake to 2 Liters (half-gallon) per day.   -Limit your salt intake to ideally 2-3 grams (2000-3000 mg) per day. -Weigh yourself daily and record, and bring that "weight diary" to your next appointment.  (Weight gain of 2-3 pounds in 1 day typically means fluid weight.) -The medications for your heart are to help your heart and help you live longer.   -Please contact us before stopping any of your heart medications.  Call the clinic at [phone] with questions or to reschedule future appointments.

## 2019-09-08 ENCOUNTER — Other Ambulatory Visit: Payer: Self-pay | Admitting: Internal Medicine

## 2019-09-08 DIAGNOSIS — F418 Other specified anxiety disorders: Secondary | ICD-10-CM

## 2019-10-06 ENCOUNTER — Ambulatory Visit (HOSPITAL_COMMUNITY): Payer: BC Managed Care – PPO

## 2019-10-06 ENCOUNTER — Encounter (HOSPITAL_COMMUNITY): Payer: BC Managed Care – PPO | Admitting: Internal Medicine

## 2019-10-16 ENCOUNTER — Other Ambulatory Visit: Payer: Self-pay | Admitting: Internal Medicine

## 2019-10-16 DIAGNOSIS — F418 Other specified anxiety disorders: Secondary | ICD-10-CM

## 2019-10-26 ENCOUNTER — Telehealth: Payer: Self-pay

## 2019-10-26 NOTE — Telephone Encounter (Signed)
  1.Medication Requested:PARoxetine (PAXIL) 30 MG tablet  2. Pharmacy (Name, Street, City):[facility], [place]  3. On Med List: Yes   4. Last Visit with PCP: 4.20.20  5. Next visit date with PCP: 6.2.21    Agent: Please be advised that RX refills may take up to 3 business days. We ask that you follow-up with your pharmacy.

## 2019-10-27 ENCOUNTER — Encounter: Payer: Self-pay | Admitting: Internal Medicine

## 2019-10-27 ENCOUNTER — Ambulatory Visit (INDEPENDENT_AMBULATORY_CARE_PROVIDER_SITE_OTHER): Payer: BC Managed Care – PPO | Admitting: Internal Medicine

## 2019-10-27 ENCOUNTER — Other Ambulatory Visit: Payer: Self-pay

## 2019-10-27 VITALS — BP 124/88 | HR 93 | Temp 98.2°F | Resp 16 | Ht 74.0 in | Wt 276.0 lb

## 2019-10-27 DIAGNOSIS — D500 Iron deficiency anemia secondary to blood loss (chronic): Secondary | ICD-10-CM

## 2019-10-27 DIAGNOSIS — G63 Polyneuropathy in diseases classified elsewhere: Secondary | ICD-10-CM

## 2019-10-27 DIAGNOSIS — F418 Other specified anxiety disorders: Secondary | ICD-10-CM

## 2019-10-27 DIAGNOSIS — R7989 Other specified abnormal findings of blood chemistry: Secondary | ICD-10-CM | POA: Insufficient documentation

## 2019-10-27 DIAGNOSIS — E118 Type 2 diabetes mellitus with unspecified complications: Secondary | ICD-10-CM

## 2019-10-27 DIAGNOSIS — E519 Thiamine deficiency, unspecified: Secondary | ICD-10-CM | POA: Diagnosis not present

## 2019-10-27 DIAGNOSIS — E785 Hyperlipidemia, unspecified: Secondary | ICD-10-CM | POA: Diagnosis not present

## 2019-10-27 DIAGNOSIS — Z125 Encounter for screening for malignant neoplasm of prostate: Secondary | ICD-10-CM

## 2019-10-27 DIAGNOSIS — E781 Pure hyperglyceridemia: Secondary | ICD-10-CM

## 2019-10-27 DIAGNOSIS — Z0000 Encounter for general adult medical examination without abnormal findings: Secondary | ICD-10-CM

## 2019-10-27 DIAGNOSIS — E538 Deficiency of other specified B group vitamins: Secondary | ICD-10-CM | POA: Diagnosis not present

## 2019-10-27 DIAGNOSIS — D513 Other dietary vitamin B12 deficiency anemia: Secondary | ICD-10-CM

## 2019-10-27 DIAGNOSIS — I10 Essential (primary) hypertension: Secondary | ICD-10-CM

## 2019-10-27 DIAGNOSIS — E559 Vitamin D deficiency, unspecified: Secondary | ICD-10-CM

## 2019-10-27 DIAGNOSIS — E1165 Type 2 diabetes mellitus with hyperglycemia: Secondary | ICD-10-CM

## 2019-10-27 LAB — BASIC METABOLIC PANEL
BUN: 21 mg/dL (ref 6–23)
CO2: 32 mEq/L (ref 19–32)
Calcium: 9.3 mg/dL (ref 8.4–10.5)
Chloride: 99 mEq/L (ref 96–112)
Creatinine, Ser: 1.14 mg/dL (ref 0.40–1.50)
GFR: 65.51 mL/min (ref >60.00)
Glucose, Bld: 249 mg/dL — ABNORMAL HIGH (ref 70–99)
Potassium: 4 mEq/L (ref 3.5–5.1)
Sodium: 136 mEq/L (ref 135–145)

## 2019-10-27 LAB — HEPATIC FUNCTION PANEL
ALT: 18 U/L (ref 0–53)
AST: 21 U/L (ref 0–37)
Albumin: 4.3 g/dL (ref 3.5–5.2)
Alkaline Phosphatase: 84 U/L (ref 39–117)
Bilirubin, Direct: 0.1 mg/dL (ref 0.0–0.3)
Total Bilirubin: 0.7 mg/dL (ref 0.2–1.2)
Total Protein: 6.7 g/dL (ref 6.0–8.3)

## 2019-10-27 LAB — PROTIME-INR
INR: 0.9 ratio (ref 0.8–1.0)
Prothrombin Time: 10.4 s (ref 9.6–13.1)

## 2019-10-27 LAB — FOLATE: Folate: >24.8 ng/mL

## 2019-10-27 LAB — VITAMIN D 25 HYDROXY (VIT D DEFICIENCY, FRACTURES): VITD: 25.4 ng/mL — ABNORMAL LOW (ref 30.00–100.00)

## 2019-10-27 LAB — VITAMIN B12: Vitamin B-12: 163 pg/mL — ABNORMAL LOW (ref 211–911)

## 2019-10-27 LAB — PSA: PSA: 1.12 ng/mL (ref 0.10–4.00)

## 2019-10-27 MED ORDER — PAROXETINE HCL 30 MG PO TABS: 30.0000 mg | ORAL_TABLET | Freq: Every day | ORAL | 1 refills | Status: DC

## 2019-10-27 MED ORDER — CHOLECALCIFEROL 50 MCG (2000 UT) PO TABS: 2.0000 | ORAL_TABLET | Freq: Every day | ORAL | 1 refills | Status: DC

## 2019-10-27 NOTE — Progress Notes (Signed)
Subjective:  Patient ID: Gregory [name], male    DOB: October 30, 1960  Age: 59 y.o. MRN: [id number]  CC: Annual Exam, Anemia, Hypertension, Hyperlipidemia, and Diabetes  This visit occurred during the SARS-CoV-2 public health emergency.  Safety protocols were in place, including screening questions prior to the visit, additional usage of staff PPE, and extensive cleaning of exam room while observing appropriate contact time as indicated for disinfecting solutions.   HPI Gregory [name] presents for a CPX.  He was diagnosed and treated for Covid about 3 or 4 months ago.  He feels like he is fully recovered.  His Covid infection was complicated by elevated LFTs and anemia.  He denies abdominal pain, nausea, vomiting, loss of appetite, or icterus.  He was feeling well until about a week ago when he ran out of paroxetine.  Since then he has had a few episodes of diarrhea and dizziness which he thinks is due to the withdrawal.  He wants to continue taking paroxetine.  He saw his endocrinologist about 2 months ago and his A1c was 8.8%.  He continues to work on getting better control of his blood sugar.  He tells me his heart failure is under good control.  He said no recent episodes of chest pain, shortness of breath, diaphoresis, palpitations, edema, or fatigue.  Outpatient Medications Prior to Visit  Medication Sig Dispense Refill  . atorvastatin (LIPITOR) 20 MG tablet Take 1 tablet (20 mg total) by mouth at bedtime. 90 tablet 10  . atorvastatin (LIPITOR) 40 MG tablet Take 40 mg by mouth daily.    . blood glucose meter kit and supplies KIT Dispense based on patient and insurance preference. Use up to four times daily as directed. (FOR ICD-9 250.00, 250.01). 1 each 0  . carvedilol (COREG) 12.5 MG tablet Take 1 tablet (12.5 mg total) by mouth 2 (two) times daily. 180 tablet 3  . cetirizine (ZYRTEC) 10 MG tablet Take 10 mg by mouth daily.    . Continuous Blood Gluc Sensor (FREESTYLE LIBRE 14 DAY SENSOR) MISC U  UTD    . digoxin (LANOXIN) 0.125 MG tablet Take 1 tablet (125 mcg total) by mouth daily. 90 tablet 3  . FIASP FLEXTOUCH 100 UNIT/ML FlexTouch Pen     . furosemide (LASIX) 40 MG tablet Take 1 tablet (40 mg total) by mouth daily. 90 tablet 2  . insulin aspart (NOVOLOG FLEXPEN) 100 UNIT/ML FlexPen Add to meal coverage Sliding scale CBG 70 - 120: 0 units CBG 121 - 150: 1 unit,  CBG 151 - 200: 2 units,  CBG 201 - 250: 3 units,  CBG 251 - 300: 5 units,  CBG 301 - 350: 7 units,  CBG 351 - 400: 9 units   CBG > 400: 9 units and notify your MD (Patient taking differently: Inject 7-18 Units into the skin 3 (three) times daily with meals. Per sliding scale Add to meal coverage Sliding scale CBG 70 - 120: 0 units CBG 121 - 150: 1 unit,  CBG 151 - 200: 2 units,  CBG 201 - 250: 3 units,  CBG 251 - 300: 5 units,  CBG 301 - 350: 7 units,  CBG 351 - 400: 9 units   CBG > 400: 9 units and notify your MD) 15 mL 11  . levothyroxine (SYNTHROID, LEVOTHROID) 25 MCG tablet TAKE 1 TABLET ONCE DAILY BEFORE BREAKFAST 30 tablet 0  . OZEMPIC, 0.25 OR 0.5 MG/DOSE, 2 MG/1.5ML SOPN Inject 1 mg into the skin once  a week.     . potassium chloride SA (KLOR-CON) 20 MEQ tablet Take 20 meq (1 tab) with every dose of Furosemide 90 tablet 3  . sacubitril-valsartan (ENTRESTO) 97-103 MG Take 1 tablet by mouth 2 (two) times daily. 60 tablet 3  . thiamine (VITAMIN B-1) 100 MG tablet Take 1 tablet (100 mg total) by mouth daily. 90 tablet 1  . Cholecalciferol 50 MCG (2000 UT) TABS Take 2 tablets (4,000 Units total) by mouth daily. 180 tablet 1  . PARoxetine (PAXIL) 30 MG tablet Take 1 tablet (30 mg total) by mouth daily. 90 tablet 0  . fluticasone furoate-vilanterol (BREO ELLIPTA) 100-25 MCG/INH AEPB Inhale 1 puff into the lungs daily. 1 each 5  . promethazine-codeine (PHENERGAN WITH CODEINE) 6.25-10 MG/5ML syrup Take 5 mLs by mouth every 6 (six) hours as needed for cough. 300 mL 0   No facility-administered medications prior to visit.     ROS Review of Systems  Constitutional: Negative.  Negative for appetite change, diaphoresis, fatigue and unexpected weight change.  HENT: Negative.  Negative for trouble swallowing and voice change.   Eyes: Negative for visual disturbance.  Respiratory: Negative.  Negative for cough, chest tightness, shortness of breath and wheezing.   Cardiovascular: Negative for chest pain, palpitations and leg swelling.  Gastrointestinal: Positive for diarrhea. Negative for abdominal pain, blood in stool, constipation, nausea and vomiting.  Endocrine: Negative.   Genitourinary: Negative.  Negative for difficulty urinating, dysuria, flank pain, hematuria, penile pain, penile swelling, scrotal swelling, testicular pain and urgency.  Musculoskeletal: Negative.   Skin: Negative.  Negative for color change and rash.  Allergic/Immunologic: Negative.   Neurological: Positive for dizziness. Negative for weakness, light-headedness and headaches.  Hematological: Negative for adenopathy. Does not bruise/bleed easily.  Psychiatric/Behavioral: Positive for dysphoric mood. Negative for behavioral problems, confusion, decreased concentration, self-injury and sleep disturbance. The patient is nervous/anxious. The patient is not hyperactive.     Objective:  BP 124/88 (BP Location: Left Arm, Patient Position: Sitting, Cuff Size: Large)   Pulse 93   Temp 98.2 F (36.8 C) (Oral)   Resp 16   Ht _0  (1.88 m)   Wt 276 lb (125.2 kg)   SpO2 98%   BMI 35.44 kg/m   BP Readings from Last 3 Encounters:  10/27/19 124/88  08/31/19 125/90  08/05/19 122/82    Wt Readings from Last 3 Encounters:  10/27/19 276 lb (125.2 kg)  08/31/19 275 lb 12.8 oz (125.1 kg)  08/05/19 273 lb (123.8 kg)    Physical Exam Vitals reviewed.  Constitutional:      Appearance: Normal appearance.  HENT:     Nose: Nose normal.     Mouth/Throat:     Mouth: Mucous membranes are moist.  Eyes:     General: No scleral icterus.     Conjunctiva/sclera: Conjunctivae normal.  Cardiovascular:     Rate and Rhythm: Normal rate and regular rhythm.     Pulses: Normal pulses.     Heart sounds: No murmur. No gallop.   Pulmonary:     Effort: Pulmonary effort is normal.     Breath sounds: No stridor. No wheezing, rhonchi or rales.  Abdominal:     General: Abdomen is flat. There is no distension.     Palpations: Abdomen is soft. There is no hepatomegaly or mass.     Tenderness: There is no abdominal tenderness.     Hernia: No hernia is present. There is no hernia in the left inguinal area or  right inguinal area.  Genitourinary:    Pubic Area: No rash.      Penis: Normal. No discharge, swelling or lesions.      Testes: Normal.        Right: Mass, tenderness or swelling not present.        Left: Mass, tenderness or swelling not present.     Epididymis:     Right: Normal. Not inflamed or enlarged. No mass.     Left: Normal. Not inflamed or enlarged. No mass.     Prostate: Normal. Not enlarged, not tender and no nodules present.     Rectum: Normal. Guaiac result negative. No mass, tenderness, anal fissure, external hemorrhoid or internal hemorrhoid. Normal anal tone.  Musculoskeletal:        General: Normal range of motion.     Cervical back: Neck supple.     Right lower leg: No edema.     Left lower leg: No edema.  Lymphadenopathy:     Cervical: No cervical adenopathy.     Lower Body: No right inguinal adenopathy. No left inguinal adenopathy.  Skin:    General: Skin is warm and dry.     Coloration: Skin is not pale.  Neurological:     General: No focal deficit present.     Mental Status: He is alert.  Psychiatric:        Mood and Affect: Mood normal.        Behavior: Behavior normal.     Lab Results  Component Value Date   WBC 7.6 07/29/2019   HGB 13.2 07/29/2019   HCT 42.3 07/29/2019   PLT 221 07/29/2019   GLUCOSE 249 (H) 10/27/2019   CHOL 179 08/31/2019   TRIG 186 (A) 08/31/2019   HDL 42 08/31/2019    LDLDIRECT 115.0 07/29/2018   LDLCALC 105 08/31/2019   ALT 18 10/27/2019   AST 21 10/27/2019   NA 136 10/27/2019   K 4.0 10/27/2019   CL 99 10/27/2019   CREATININE 1.14 10/27/2019   BUN 21 10/27/2019   CO2 32 10/27/2019   TSH 2.33 08/31/2019   PSA 1.12 10/27/2019   INR 0.9 10/27/2019   HGBA1C 8.8 08/31/2019   MICROALBUR 4.2 08/31/2019    No results found.  Assessment & Plan:   [place] was seen today for annual exam, anemia, hypertension, hyperlipidemia and diabetes.  Diagnoses and all orders for this visit:  Vitamin B12 deficiency neuropathy ([place])- His B12 level is low.  He agrees to restart parenteral B12 replacement therapy. -     Vitamin B12; Future -     Folate; Future -     Folate -     Vitamin B12  Hyperlipidemia LDL goal <70- He had lipids done elsewhere about 2 months ago.  He has achieved his LDL goal and is doing well on the statin. -     Hepatic function panel; Future -     Cancel: TSH; Future -     Hepatic function panel  Manifestations of thiamine deficiency- I will monitor his thiamine level. -     Vitamin B1; Future -     Vitamin B1  Other dietary vitamin B12 deficiency anemia- His H&H are normal but his B12 level is low.  Routine health maintenance- Exam completed, labs reviewed, vaccines reviewed and updated, cancer screenings are up-to-date, patient education was given.  -     Cancel: Lipid panel; Future -     PSA; Future -     PSA  Vitamin D deficiency disease- His vitamin D level is low.  I have asked him to start taking a vitamin D supplement. -     VITAMIN D 25 Hydroxy (Vit-D Deficiency, Fractures); Future -     VITAMIN D 25 Hydroxy (Vit-D Deficiency, Fractures) -     Cholecalciferol 50 MCG (2000 UT) TABS; Take 2 tablets (4,000 Units total) by mouth daily.  Pure hyperglyceridemia- His triglycerides remain mildly elevated.  He agrees to improve his lifestyle modifications.  Iron deficiency anemia due to chronic blood loss- His H/H are normal  now.  Essential hypertension-his blood pressure is adequately well controlled. -     Basic metabolic panel; Future -     Cancel: TSH; Future -     Cancel: Urinalysis, Routine w reflex microscopic; Future -     Basic metabolic panel  Elevated LFTs- His LFTs are normal and his INR is normal.  This is reassuring that he does not have any scarring.  Screening for viral hepatitis is negative.  I recommended that he get vaccinated against hepatitis A and B. -     Hepatic function panel; Future -     Hepatitis A antibody, total; Future -     Hepatitis B core antibody, total; Future -     Hepatitis B surface antibody,qualitative; Future -     Hepatitis C antibody; Future -     Hepatitis B surface antigen; Future -     Protime-INR; Future -     Protime-INR -     Hepatitis B surface antigen -     Hepatitis C antibody -     Hepatitis B surface antibody,qualitative -     Hepatitis B core antibody, total -     Hepatitis A antibody, total -     Hepatic function panel  Poorly controlled type 2 diabetes mellitus with complication [place])- This is managed by endocrinology. -     Cancel: Hemoglobin A1c; Future -     Cancel: Microalbumin / creatinine urine ratio; Future  Depression with anxiety- Will restart paroxetine. -     PARoxetine (PAXIL) 30 MG tablet; Take 1 tablet (30 mg total) by mouth daily.   I have discontinued Gabreil [place]. Rambo's promethazine-codeine and Breo Ellipta. I am also having him maintain his cetirizine, blood glucose meter kit and supplies, insulin aspart, thiamine, levothyroxine, Ozempic (0.25 or 0.5 MG/DOSE), FreeStyle Libre 14 Day Sensor, potassium chloride SA, furosemide, digoxin, Entresto, atorvastatin, carvedilol, Fiasp FlexTouch, atorvastatin, PARoxetine, and Cholecalciferol.  Meds ordered this encounter  Medications  . PARoxetine (PAXIL) 30 MG tablet    Sig: Take 1 tablet (30 mg total) by mouth daily.    Dispense:  90 tablet    Refill:  1  . Cholecalciferol 50 MCG (2000  UT) TABS    Sig: Take 2 tablets (4,000 Units total) by mouth daily.    Dispense:  180 tablet    Refill:  1   In addition to time spent on CPE, I spent 60 minutes in preparing to see the patient by review of recent labs, imaging and procedures, obtaining and reviewing separately obtained history, communicating with the patient and family or caregiver, ordering medications, tests or procedures, and documenting clinical information in the EHR including the differential Dx, treatment, and any further evaluation and other management of 1. Vitamin B12 deficiency neuropathy ([place]) 2. Hyperlipidemia LDL goal <70 3. Manifestations of thiamine deficiency 4. Other dietary vitamin B12 deficiency anemia 5. Vitamin D deficiency disease 6. Pure hyperglyceridemia 7.  Iron deficiency anemia due to chronic blood loss 8. Essential hypertension 9. Elevated LFTs 10. Poorly controlled type 2 diabetes mellitus with complication ([place]) 11. Depression with anxiety     Follow-up: Return in about 6 months (around 04/27/2020).  Scarlette Calico, MD

## 2019-10-27 NOTE — Telephone Encounter (Signed)
Script sent in today. 

## 2019-10-27 NOTE — Patient Instructions (Signed)

## 2019-10-28 ENCOUNTER — Other Ambulatory Visit: Payer: Self-pay

## 2019-10-28 ENCOUNTER — Ambulatory Visit (INDEPENDENT_AMBULATORY_CARE_PROVIDER_SITE_OTHER): Payer: BC Managed Care – PPO | Admitting: *Deleted

## 2019-10-28 DIAGNOSIS — E538 Deficiency of other specified B group vitamins: Secondary | ICD-10-CM | POA: Diagnosis not present

## 2019-10-28 LAB — HEPATITIS A ANTIBODY, TOTAL: Hepatitis A AB,Total: NONREACTIVE

## 2019-10-28 LAB — HEPATITIS C ANTIBODY
Hepatitis C Ab: NONREACTIVE
SIGNAL TO CUT-OFF: 0.01 (ref <1.00)

## 2019-10-28 MED ORDER — CYANOCOBALAMIN 1000 MCG/ML IJ SOLN
1000.0000 ug | Freq: Once | INTRAMUSCULAR | Status: AC
  Administered 2019-10-28: 1000 ug via INTRAMUSCULAR

## 2019-10-28 NOTE — Progress Notes (Signed)
Pls cosign for B12 inj../lmb  

## 2019-10-29 LAB — HEPATITIS B SURFACE ANTIBODY,QUALITATIVE: Hep B S Ab: NONREACTIVE

## 2019-10-29 LAB — VITAMIN B1

## 2019-10-29 LAB — HEPATITIS B CORE ANTIBODY, TOTAL: Hep B Core Total Ab: NONREACTIVE

## 2019-10-29 LAB — HEPATITIS B SURFACE ANTIGEN: Hepatitis B Surface Ag: NONREACTIVE

## 2019-10-31 ENCOUNTER — Encounter: Payer: Self-pay | Admitting: Internal Medicine

## 2019-11-10 ENCOUNTER — Other Ambulatory Visit: Payer: Self-pay

## 2019-11-10 ENCOUNTER — Ambulatory Visit (INDEPENDENT_AMBULATORY_CARE_PROVIDER_SITE_OTHER): Payer: BC Managed Care – PPO | Admitting: *Deleted

## 2019-11-10 DIAGNOSIS — Z23 Encounter for immunization: Secondary | ICD-10-CM

## 2019-11-15 DIAGNOSIS — Z7982 Long term (current) use of aspirin: Secondary | ICD-10-CM | POA: Diagnosis not present

## 2019-11-15 DIAGNOSIS — Z905 Acquired absence of kidney: Secondary | ICD-10-CM | POA: Diagnosis not present

## 2019-11-15 DIAGNOSIS — Z794 Long term (current) use of insulin: Secondary | ICD-10-CM | POA: Diagnosis not present

## 2019-11-15 DIAGNOSIS — Z08 Encounter for follow-up examination after completed treatment for malignant neoplasm: Secondary | ICD-10-CM | POA: Diagnosis not present

## 2019-11-15 DIAGNOSIS — Z79890 Hormone replacement therapy (postmenopausal): Secondary | ICD-10-CM | POA: Diagnosis not present

## 2019-11-15 DIAGNOSIS — C649 Malignant neoplasm of unspecified kidney, except renal pelvis: Secondary | ICD-10-CM | POA: Diagnosis not present

## 2019-11-15 DIAGNOSIS — R918 Other nonspecific abnormal finding of lung field: Secondary | ICD-10-CM | POA: Diagnosis not present

## 2019-11-15 DIAGNOSIS — Z85528 Personal history of other malignant neoplasm of kidney: Secondary | ICD-10-CM | POA: Diagnosis not present

## 2019-11-15 DIAGNOSIS — Z79899 Other long term (current) drug therapy: Secondary | ICD-10-CM | POA: Diagnosis not present

## 2019-11-16 DIAGNOSIS — Z85528 Personal history of other malignant neoplasm of kidney: Secondary | ICD-10-CM | POA: Diagnosis not present

## 2019-11-16 DIAGNOSIS — C7802 Secondary malignant neoplasm of left lung: Secondary | ICD-10-CM | POA: Diagnosis not present

## 2019-11-16 DIAGNOSIS — E1065 Type 1 diabetes mellitus with hyperglycemia: Secondary | ICD-10-CM | POA: Diagnosis not present

## 2019-11-16 DIAGNOSIS — C641 Malignant neoplasm of right kidney, except renal pelvis: Secondary | ICD-10-CM | POA: Diagnosis not present

## 2019-11-16 DIAGNOSIS — Z08 Encounter for follow-up examination after completed treatment for malignant neoplasm: Secondary | ICD-10-CM | POA: Diagnosis not present

## 2019-11-22 ENCOUNTER — Other Ambulatory Visit: Payer: Self-pay

## 2019-11-22 ENCOUNTER — Encounter (INDEPENDENT_AMBULATORY_CARE_PROVIDER_SITE_OTHER): Payer: BC Managed Care – PPO | Admitting: Ophthalmology

## 2019-11-22 DIAGNOSIS — H353211 Exudative age-related macular degeneration, right eye, with active choroidal neovascularization: Secondary | ICD-10-CM

## 2019-11-22 DIAGNOSIS — I10 Essential (primary) hypertension: Secondary | ICD-10-CM | POA: Diagnosis not present

## 2019-11-22 DIAGNOSIS — H353121 Nonexudative age-related macular degeneration, left eye, early dry stage: Secondary | ICD-10-CM

## 2019-11-22 DIAGNOSIS — H43813 Vitreous degeneration, bilateral: Secondary | ICD-10-CM

## 2019-11-22 DIAGNOSIS — H35033 Hypertensive retinopathy, bilateral: Secondary | ICD-10-CM | POA: Diagnosis not present

## 2019-12-01 ENCOUNTER — Ambulatory Visit (INDEPENDENT_AMBULATORY_CARE_PROVIDER_SITE_OTHER): Payer: BC Managed Care – PPO

## 2019-12-01 ENCOUNTER — Other Ambulatory Visit: Payer: Self-pay

## 2019-12-01 DIAGNOSIS — E039 Hypothyroidism, unspecified: Secondary | ICD-10-CM | POA: Diagnosis not present

## 2019-12-01 DIAGNOSIS — E1165 Type 2 diabetes mellitus with hyperglycemia: Secondary | ICD-10-CM | POA: Diagnosis not present

## 2019-12-01 DIAGNOSIS — Z794 Long term (current) use of insulin: Secondary | ICD-10-CM | POA: Diagnosis not present

## 2019-12-01 DIAGNOSIS — E538 Deficiency of other specified B group vitamins: Secondary | ICD-10-CM

## 2019-12-01 MED ORDER — CYANOCOBALAMIN 1000 MCG/ML IJ SOLN
1000.0000 ug | INTRAMUSCULAR | Status: AC
  Administered 2019-12-01: 1000 ug via INTRAMUSCULAR

## 2019-12-01 NOTE — Progress Notes (Signed)
Pt here for weekly B12 injection per Dr Ronnald Ramp  B12 1067mcg given IM right deltoid and pt tolerated injection well.  Next B12 injection scheduled for 12/08/19. Clarified order with Dr Ronnald Ramp prior to visit.  Pt to receive weekly B12 injections x 3 then start monthly injections.  Order placed in EMR as such.

## 2019-12-08 ENCOUNTER — Other Ambulatory Visit: Payer: Self-pay

## 2019-12-08 ENCOUNTER — Ambulatory Visit (INDEPENDENT_AMBULATORY_CARE_PROVIDER_SITE_OTHER): Payer: BC Managed Care – PPO | Admitting: *Deleted

## 2019-12-08 DIAGNOSIS — E538 Deficiency of other specified B group vitamins: Secondary | ICD-10-CM | POA: Diagnosis not present

## 2019-12-08 MED ORDER — CYANOCOBALAMIN 1000 MCG/ML IJ SOLN
1000.0000 ug | Freq: Once | INTRAMUSCULAR | Status: AC
  Administered 2019-12-08: 1000 ug via INTRAMUSCULAR

## 2019-12-08 NOTE — Progress Notes (Signed)
Pls cosign for B12 inj../lmb  

## 2019-12-10 ENCOUNTER — Ambulatory Visit (INDEPENDENT_AMBULATORY_CARE_PROVIDER_SITE_OTHER): Payer: BC Managed Care – PPO | Admitting: *Deleted

## 2019-12-10 ENCOUNTER — Other Ambulatory Visit: Payer: Self-pay

## 2019-12-10 DIAGNOSIS — Z23 Encounter for immunization: Secondary | ICD-10-CM

## 2019-12-13 ENCOUNTER — Ambulatory Visit (HOSPITAL_BASED_OUTPATIENT_CLINIC_OR_DEPARTMENT_OTHER)
Admission: RE | Admit: 2019-12-13 | Discharge: 2019-12-13 | Disposition: A | Discharge status: Home / Self Care | Payer: BC Managed Care – PPO | Source: Ambulatory Visit | Attending: Internal Medicine | Admitting: Internal Medicine

## 2019-12-13 ENCOUNTER — Encounter (HOSPITAL_COMMUNITY): Payer: Self-pay | Admitting: Internal Medicine

## 2019-12-13 ENCOUNTER — Ambulatory Visit (HOSPITAL_COMMUNITY)
Admission: RE | Admit: 2019-12-13 | Discharge: 2019-12-13 | Disposition: A | Discharge status: Home / Self Care | Payer: BC Managed Care – PPO | Source: Ambulatory Visit | Attending: Internal Medicine | Admitting: Internal Medicine

## 2019-12-13 ENCOUNTER — Other Ambulatory Visit: Payer: Self-pay

## 2019-12-13 ENCOUNTER — Other Ambulatory Visit (HOSPITAL_COMMUNITY): Payer: BC Managed Care – PPO

## 2019-12-13 VITALS — BP 140/90 | HR 88 | Wt 279.0 lb

## 2019-12-13 DIAGNOSIS — I10 Essential (primary) hypertension: Secondary | ICD-10-CM | POA: Diagnosis not present

## 2019-12-13 DIAGNOSIS — R0683 Snoring: Secondary | ICD-10-CM | POA: Diagnosis not present

## 2019-12-13 DIAGNOSIS — I110 Hypertensive heart disease with heart failure: Secondary | ICD-10-CM | POA: Diagnosis not present

## 2019-12-13 DIAGNOSIS — Z85528 Personal history of other malignant neoplasm of kidney: Secondary | ICD-10-CM | POA: Insufficient documentation

## 2019-12-13 DIAGNOSIS — F419 Anxiety disorder, unspecified: Secondary | ICD-10-CM | POA: Diagnosis not present

## 2019-12-13 DIAGNOSIS — I5022 Chronic systolic (congestive) heart failure: Secondary | ICD-10-CM | POA: Diagnosis not present

## 2019-12-13 DIAGNOSIS — E1040 Type 1 diabetes mellitus with diabetic neuropathy, unspecified: Secondary | ICD-10-CM | POA: Diagnosis not present

## 2019-12-13 DIAGNOSIS — Z79899 Other long term (current) drug therapy: Secondary | ICD-10-CM | POA: Diagnosis not present

## 2019-12-13 DIAGNOSIS — I5023 Acute on chronic systolic (congestive) heart failure: Secondary | ICD-10-CM | POA: Diagnosis not present

## 2019-12-13 DIAGNOSIS — Z8616 Personal history of COVID-19: Secondary | ICD-10-CM | POA: Insufficient documentation

## 2019-12-13 DIAGNOSIS — I2510 Atherosclerotic heart disease of native coronary artery without angina pectoris: Secondary | ICD-10-CM | POA: Diagnosis not present

## 2019-12-13 DIAGNOSIS — Z8249 Family history of ischemic heart disease and other diseases of the circulatory system: Secondary | ICD-10-CM | POA: Insufficient documentation

## 2019-12-13 DIAGNOSIS — E781 Pure hyperglyceridemia: Secondary | ICD-10-CM | POA: Diagnosis not present

## 2019-12-13 DIAGNOSIS — I428 Other cardiomyopathies: Secondary | ICD-10-CM | POA: Diagnosis not present

## 2019-12-13 DIAGNOSIS — Z794 Long term (current) use of insulin: Secondary | ICD-10-CM | POA: Insufficient documentation

## 2019-12-13 DIAGNOSIS — R918 Other nonspecific abnormal finding of lung field: Secondary | ICD-10-CM | POA: Diagnosis not present

## 2019-12-13 DIAGNOSIS — Z905 Acquired absence of kidney: Secondary | ICD-10-CM | POA: Insufficient documentation

## 2019-12-13 LAB — ECHOCARDIOGRAM COMPLETE
Area-P 1/2: 7.44 cm2
S' Lateral: 5.8 cm

## 2019-12-13 MED ORDER — SPIRONOLACTONE 25 MG PO TABS
12.5000 mg | ORAL_TABLET | Freq: Every day | ORAL | 3 refills | Status: DC
Start: 2019-12-13 — End: 2020-10-25

## 2019-12-13 MED ORDER — FUROSEMIDE 40 MG PO TABS: 40.0000 mg | ORAL_TABLET | ORAL | 2 refills | Status: DC

## 2019-12-13 NOTE — Progress Notes (Signed)
Patient Name: Gregory [name]        DOB: 11/02/1960      Height: 6'2"    Weight: 279  Office Name: Advanced [facility]         Referring Provider: Glori Bickers, MD  Today's Date: 12/13/2019    STOP BANG RISK ASSESSMENT S (snore) Have you been told that you snore?     YES   T (tired) Are you often tired, fatigued, or sleepy during the day?   YES  O (obstruction) Do you stop breathing, choke, or gasp during sleep? NO   P (pressure) Do you have or are you being treated for high blood pressure? YES   B (BMI) Is your body index greater than 35 kg/m? YES   A (age) Are you 60 years old or older? YES   N (neck) Do you have a neck circumference greater than 16 inches?   YES   G (gender) Are you a male? YES   TOTAL STOP/BANG YES ANSWERS                                                                        For Office Use Only              Procedure Order Form    YES to 3+ Stop Bang questions OR two clinical symptoms - patient qualifies for [employer] (CPT 75883)     Submit: This Form + Patient Face Sheet + Clinical Note via [employer] or Fax: [phone]         Clinical Notes: Will consult Sleep Specialist and refer for management of therapy due to patient increased risk of Sleep Apnea. Ordering a sleep study due to the following two clinical symptoms:Loud snoring R06.83 / Unrefreshed by sleep G47.8  History of high blood pressure R03.0 /   I understand that I am proceeding with a home sleep apnea test as ordered by my treating physician. I understand that untreated sleep apnea is a serious cardiovascular risk factor and it is my responsibility to perform the test and seek management for sleep apnea. I will be contacted with the results and be managed for sleep apnea by a local sleep physician. I will be receiving equipment and further instructions from [facility]. I shall promptly ship back the equipment via the included mailing label. I understand my insurance will be billed for  the test and as the patient I am responsible for any insurance related out-of-pocket costs incurred. I have been provided with written instructions and can call for additional video or telephonic instruction, with 24-hour availability of qualified personnel to answer any questions: Patient Help Desk [phone].  Patient Signature ______________________________________________________   Date______________________ Patient Telemedicine Verbal Consent

## 2019-12-13 NOTE — Progress Notes (Signed)
Advanced Heart Failure Clinic Note   Date:  12/13/2019   ID:  LASHON HILLIER, DOB 10/05/1960, MRN [id number]  Location: Home  Provider location: [place] [facility] Type of Visit: Established patient  PCP:  [name], MD  Cardiologist:  No primary care provider on file. Primary HF: Lavere Shinsky  Chief Complaint: Heart Failure follow-up   History of Present Illness:  Daichi is a 59 y/o male with h/o obesity, metastatic R renal cell CA s/p R nephrectomy in 9/17 with the subsequent discovery of 2 pulmonary nodules whom we follow for non-ischemic cardiomyopathy.  Developed microscopic hematuria and found to have large right renal mass c/w renal cell CA. Eventually underwent right radical nephrectomy with adrenal sparing on 02/07/16 at [facility]. Post-op developed bradycardia with HRs in 30s while sleeping. BP stable. When awakened had dizziness so given atropine. ECG revealed sinus brady in 40s with mild QT prolongation which resolved. Echo done and EF 45% with global HK. RV dilated  With normal function .  We saw him for the first time in October 2017. At that time we ordered cMRI and stress test. However in the interim he went back to [facility]. Found to have 2 metastatic lung nodules Echo showed EF at 46%. However MUGA with EF 28% so referred back to [place] for further evaluation prior to chemo. Underwent cath 04/09/16 as below. Had chemo and lung nodule resection.   1. 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 Otherwise normal coronaries 3. Nonischemic, dilated cardiomyopathy with EF 30-35% and global hypokinesis  cMRI 05/29/16 EF 28% No LGE  Had sleep study which was negative.   Had COVID 03/19/19. Post-COVID seen at [facility] and had CT with ground glass changes felt to be post-COVID changes. Subsequently developed HF symptoms. Had echo 07/29/19 EF 10-15% Mild RV dysfunction. Underwent  aggressive med titration with help of PharmDs. He returns for HF Clinic f/u today. Recently seen at [facility]. Repeat chest CT showed resolution of ground glass. Feels good. Now back to baseline. Able to walk with 38-week-old black lab puppy. No CP, DOE, edema. No problems with meds. No hypotension.   Echo 12/13/19 EF 20-25%. RV normal  Echos:    Echo 5/20 EF 35-40% ECHO 09/12/16 35-40%, RV ok. - Reviewed personally  Echo 05/22/17: EF 50-55%, grade 1 DD.     Past Medical History:  Diagnosis Date   Allergy    Anemia    low iron   Anxiety    Cancer (HCC)    Renal cell cancer   Complex tear of lateral meniscus of right knee as current injury 03/20/2017   Complex tear of medial meniscus of right knee 03/20/2017   Diabetes mellitus without complication ([place])    History of gastrointestinal hemorrhage    Hypertension    Hypertriglyceridemia    Macular degeneration    lasar surgery- Dr Zigmund Wayde Gopaul   Neuropathy    Nonischemic cardiomyopathy ([place])    EF 25-30% by echo 09/12/16   Plantar warts    PONV (postoperative nausea and vomiting)    " a little bit of nausea"   Primary localized osteoarthritis of right knee 03/20/2017   Renal cell carcinoma ([place])    s/p nephrectomy 02/07/16 at [facility])   Past Surgical History:  Procedure Laterality Date   CARDIAC CATHETERIZATION N/A 04/19/2016   Procedure: Left Heart Cath and Coronary Angiography;  Surgeon:  Jolaine Artist, MD;  Location: [place] [facility];  Service: Cardiovascular;  Laterality: N/A;   CHONDROPLASTY Right 03/20/2017   Procedure: CHONDROPLASTY;  Surgeon: Marchia Bond, MD;  Location: [place];  Service: Orthopedics;  Laterality: Right;   COLONOSCOPY  03/22/2005   EYE SURGERY Right    laser surgery, right eye macular degeneration   KNEE ARTHROSCOPY Right    x 2   KNEE ARTHROSCOPY WITH MEDIAL MENISECTOMY Right 03/20/2017   Procedure: KNEE ARTHROSCOPY WITH MEDIAL AND LATERAL MENISECTOMY;  Surgeon:  Marchia Bond, MD;  Location: [place];  Service: Orthopedics;  Laterality: Right;   NEPHRECTOMY Right    orif fx fibula     sebaceous cyst excision  1985   TONSILLECTOMY     TOTAL SHOULDER ARTHROPLASTY Left 11/11/2014   Procedure: LEFT TOTAL SHOULDER ARTHROPLASTY;  Surgeon: Netta Cedars, MD;  Location: [place];  Service: Orthopedics;  Laterality: Left;   VASECTOMY       Current Outpatient Medications  Medication Sig Dispense Refill   atorvastatin (LIPITOR) 20 MG tablet Take 1 tablet (20 mg total) by mouth at bedtime. 90 tablet 10   atorvastatin (LIPITOR) 40 MG tablet Take 40 mg by mouth daily.     blood glucose meter kit and supplies KIT Dispense based on patient and insurance preference. Use up to four times daily as directed. (FOR ICD-9 250.00, 250.01). 1 each 0   carvedilol (COREG) 12.5 MG tablet Take 1 tablet (12.5 mg total) by mouth 2 (two) times daily. 180 tablet 3   cetirizine (ZYRTEC) 10 MG tablet Take 10 mg by mouth daily.     Cholecalciferol 50 MCG (2000 UT) TABS Take 2 tablets (4,000 Units total) by mouth daily. 180 tablet 1   Continuous Blood Gluc Sensor (FREESTYLE LIBRE 14 DAY SENSOR) MISC U UTD     digoxin (LANOXIN) 0.125 MG tablet Take 1 tablet (125 mcg total) by mouth daily. 90 tablet 3   FIASP FLEXTOUCH 100 UNIT/ML FlexTouch Pen      furosemide (LASIX) 40 MG tablet Take 1 tablet (40 mg total) by mouth daily. 90 tablet 2   insulin aspart (NOVOLOG FLEXPEN) 100 UNIT/ML FlexPen Add to meal coverage Sliding scale CBG 70 - 120: 0 units CBG 121 - 150: 1 unit,  CBG 151 - 200: 2 units,  CBG 201 - 250: 3 units,  CBG 251 - 300: 5 units,  CBG 301 - 350: 7 units,  CBG 351 - 400: 9 units   CBG > 400: 9 units and notify your MD (Patient taking differently: Inject 7-18 Units into the skin 3 (three) times daily with meals. Per sliding scale Add to meal coverage Sliding scale CBG 70 - 120: 0 units CBG 121 - 150: 1 unit,  CBG 151 - 200: 2 units,  CBG 201 - 250: 3 units,  CBG 251 -  300: 5 units,  CBG 301 - 350: 7 units,  CBG 351 - 400: 9 units   CBG > 400: 9 units and notify your MD) 15 mL 11   levothyroxine (SYNTHROID, LEVOTHROID) 25 MCG tablet TAKE 1 TABLET ONCE DAILY BEFORE BREAKFAST 30 tablet 0   OZEMPIC, 0.25 OR 0.5 MG/DOSE, 2 MG/1.5ML SOPN Inject 1 mg into the skin once a week.      PARoxetine (PAXIL) 30 MG tablet Take 1 tablet (30 mg total) by mouth daily. 90 tablet 1   potassium chloride SA (KLOR-CON) 20 MEQ tablet Take 20 meq (1 tab) with every dose of Furosemide 90  tablet 3   sacubitril-valsartan (ENTRESTO) 97-103 MG Take 1 tablet by mouth 2 (two) times daily. 60 tablet 3   thiamine (VITAMIN B-1) 100 MG tablet Take 1 tablet (100 mg total) by mouth daily. 90 tablet 1   Current Facility-Administered Medications  Medication Dose Route Frequency Provider Last Rate Last Admin   cyanocobalamin ((VITAMIN B-12)) injection 1,000 mcg  1,000 mcg Intramuscular Q7 days [name], MD   1,000 mcg at 12/01/19 0930    Allergies:   Patient has no known allergies.   Social History:  The patient  reports that he has never smoked. He has never used smokeless tobacco. He reports current alcohol use. He reports that he does not use drugs.   Family History:  The patient's family history includes Bipolar disorder in his mother; Cancer (age of onset: 68) in his mother; Hypertension in his mother; Other in his father.   ROS:  Please see the history of present illness.   All other systems are personally reviewed and negative.   Vitals:   12/13/19 1101  BP: 140/90  Pulse: 88  SpO2: 96%  Weight: 126.6 kg (279 lb)    Exam:   General:  Well appearing. No resp difficulty HEENT: normal Neck: supple. no JVD. Carotids 2+ bilat; no bruits. No lymphadenopathy or thryomegaly appreciated. Cor: PMI nondisplaced. Regular rate & rhythm. No rubs, gallops or murmurs. Lungs: clear Abdomen: obese soft, nontender, nondistended. No hepatosplenomegaly. No bruits or masses. Good bowel  sounds. Extremities: no cyanosis, clubbing, rash, edema Neuro: alert & orientedx3, cranial nerves grossly intact. moves all 4 extremities w/o difficulty. Affect pleasant   Recent Labs: 07/29/2019: Hemoglobin 13.2; Platelets 221 08/05/2019: B Natriuretic Peptide 540.4 08/31/2019: TSH 2.33 10/27/2019: ALT 18; BUN 21; Creatinine, Ser 1.14; Potassium 4.0; Sodium 136  Personally reviewed   Wt Readings from Last 3 Encounters:  12/13/19 126.6 kg (279 lb)  10/27/19 125.2 kg (276 lb)  08/31/19 125.1 kg (275 lb 12.8 oz)      ASSESSMENT AND PLAN:  1. Acute on chronic systolic HF due to NICM   - Unclear etiology. EF 30-35% by cath with NICM. cMRI 1/18 with EF 28%. Etiology remains unclear. No evidence of scar of infiltrative process on MRI. Echo 4/18 EF 35-40%. Echo 04/2017 EF 50-55% - Echo 07/29/19 EF 10-15% Mild RV - Echo 12/13/19 EF 20-25%. RV normal. Personally reviewed - Volume status much better - NYHA I-II - Decrease lasix to 40 MWF with addition of spiro. Can take extra as needed - Continue carvedilol 12.5 bid for now. Increase at next visit - Continue Entresto 97/103 mg bid.  - Previously stopped spiro due to hyperkalemia but this was in setting of DKA. Will try to start spiro 12.5 and stop Kcl supplementation. Follow labs closely  - No SGLT2i with Type I DM - Discussed possible need for advanced therapies (VAD) down the road if EF not recovering with medical therapy - EF improving. Will defer ICD evaluation for now until meds fully titrated   2. Metastatic Renal Cell CA with lung nodules - s/p nephrectomy.  - Followed at [facility]. Felt to be in readmission   3. HTN:  - BP slightly elevated. Adding spiro as above  4. Snoring - 12/17 sleep study negative but has gained significant amount of weight since that time and wife reports snoring is worse - will arrange for home sleep study  5. CAD - 1v in small RCA.  - No s/s angina - Continue low-dose ASA  and statin  6. Type I  DM - Due to loss of pancrease due to immuntherapy. - Followed by Dr. Buddy Duty   Signed, Glori Bickers, MD  12/13/2019 11:48 AM  Advanced Heart Failure [place] [address] [phone] (office) [phone] (fax)

## 2019-12-13 NOTE — Patient Instructions (Signed)
DECREASE Lasix to 40 mg three times a week (Monday Wednesday and Friday)  START Spironolactone 12.5 mg, one half tab daily STOP Potassium  Your provider has recommended that you have a home sleep study.  [employer] is the company that does these test.  They will contact you by phone and must speak with you before they can ship the equipment.  Once they have spoken with you they will send the equipment right to your home with instructions on how to set it up.  Once you have completed the test you just dispose of the equipment, the information is automatically uploaded to [place] via [employer] technology.  IF you have any questions or issues with the equipment please call the company directly at [phone].  If your test is positive for sleep apnea and you need a home CPAP machine you will be contacted by Dr Theodosia Blender office [facility]) to set this up.  Labs needed in 7 days and 21 days  Your physician recommends that you schedule a follow-up appointment in: 4-6 months with Dr Haroldine Laws -please give [place] a call in December 2021 to be added to the January 2022 schedule   Do the following things EVERYDAY: 1) Weigh yourself in the morning before breakfast. Write it down and keep it in a log. 2) Take your medicines as prescribed 3) Eat low salt foods--Limit salt (sodium) to 2000 mg per day.  4) Stay as active as you can everyday 5) Limit all fluids for the day to less than 2 liters

## 2019-12-13 NOTE — Addendum Note (Signed)
Encounter addended by: Kerry Dory, CMA on: 12/13/2019 12:31 PM  Actions taken: Clinical Note Signed

## 2019-12-13 NOTE — Addendum Note (Signed)
Encounter addended by: Kerry Dory, CMA on: 12/13/2019 12:22 PM  Actions taken: Medication long-term status modified, Order list changed, Diagnosis association updated, Clinical Note Signed

## 2019-12-13 NOTE — Progress Notes (Signed)
  Echocardiogram 2D Echocardiogram has been performed.  Gregory [name] 12/13/2019, 10:57 AM

## 2019-12-20 ENCOUNTER — Ambulatory Visit (HOSPITAL_COMMUNITY)
Admission: RE | Admit: 2019-12-20 | Discharge: 2019-12-20 | Disposition: A | Discharge status: Home / Self Care | Payer: BC Managed Care – PPO | Source: Ambulatory Visit | Attending: Cardiology | Admitting: Cardiology

## 2019-12-20 ENCOUNTER — Other Ambulatory Visit: Payer: Self-pay

## 2019-12-20 DIAGNOSIS — I5022 Chronic systolic (congestive) heart failure: Secondary | ICD-10-CM | POA: Insufficient documentation

## 2019-12-20 LAB — BASIC METABOLIC PANEL
Anion gap: 8 (ref 5–15)
BUN: 22 mg/dL — ABNORMAL HIGH (ref 6–20)
CO2: 27 mmol/L (ref 22–32)
Calcium: 9 mg/dL (ref 8.9–10.3)
Chloride: 102 mmol/L (ref 98–111)
Creatinine, Ser: 1.35 mg/dL — ABNORMAL HIGH (ref 0.61–1.24)
GFR calc Af Amer: >60 mL/min (ref >60)
GFR calc non Af Amer: 57 mL/min — ABNORMAL LOW (ref >60)
Glucose, Bld: 289 mg/dL — ABNORMAL HIGH (ref 70–99)
Potassium: 3.9 mmol/L (ref 3.5–5.1)
Sodium: 137 mmol/L (ref 135–145)

## 2019-12-20 LAB — DIGOXIN LEVEL: Digoxin Level: 0.3 ng/mL — ABNORMAL LOW (ref 0.8–2.0)

## 2019-12-26 ENCOUNTER — Other Ambulatory Visit (HOSPITAL_COMMUNITY): Payer: Self-pay | Admitting: Internal Medicine

## 2020-01-03 ENCOUNTER — Ambulatory Visit (HOSPITAL_COMMUNITY)
Admission: RE | Admit: 2020-01-03 | Discharge: 2020-01-03 | Disposition: A | Discharge status: Home / Self Care | Payer: BC Managed Care – PPO | Source: Ambulatory Visit | Attending: Cardiology | Admitting: Cardiology

## 2020-01-03 ENCOUNTER — Other Ambulatory Visit: Payer: Self-pay

## 2020-01-03 DIAGNOSIS — I5022 Chronic systolic (congestive) heart failure: Secondary | ICD-10-CM | POA: Diagnosis not present

## 2020-01-03 LAB — BASIC METABOLIC PANEL
Anion gap: 13 (ref 5–15)
BUN: 24 mg/dL — ABNORMAL HIGH (ref 6–20)
CO2: 25 mmol/L (ref 22–32)
Calcium: 9.1 mg/dL (ref 8.9–10.3)
Chloride: 98 mmol/L (ref 98–111)
Creatinine, Ser: 1.33 mg/dL — ABNORMAL HIGH (ref 0.61–1.24)
GFR calc Af Amer: >60 mL/min (ref >60)
GFR calc non Af Amer: 58 mL/min — ABNORMAL LOW (ref >60)
Glucose, Bld: 350 mg/dL — ABNORMAL HIGH (ref 70–99)
Potassium: 4.2 mmol/L (ref 3.5–5.1)
Sodium: 136 mmol/L (ref 135–145)

## 2020-01-03 LAB — BRAIN NATRIURETIC PEPTIDE: B Natriuretic Peptide: 135.7 pg/mL — ABNORMAL HIGH (ref 0.0–100.0)

## 2020-01-17 DIAGNOSIS — D225 Melanocytic nevi of trunk: Secondary | ICD-10-CM | POA: Diagnosis not present

## 2020-01-17 DIAGNOSIS — D1801 Hemangioma of skin and subcutaneous tissue: Secondary | ICD-10-CM | POA: Diagnosis not present

## 2020-01-17 DIAGNOSIS — B078 Other viral warts: Secondary | ICD-10-CM | POA: Diagnosis not present

## 2020-01-17 DIAGNOSIS — L814 Other melanin hyperpigmentation: Secondary | ICD-10-CM | POA: Diagnosis not present

## 2020-01-17 DIAGNOSIS — L820 Inflamed seborrheic keratosis: Secondary | ICD-10-CM | POA: Diagnosis not present

## 2020-01-18 DIAGNOSIS — E669 Obesity, unspecified: Secondary | ICD-10-CM | POA: Diagnosis not present

## 2020-01-18 DIAGNOSIS — Z794 Long term (current) use of insulin: Secondary | ICD-10-CM | POA: Diagnosis not present

## 2020-01-18 DIAGNOSIS — R6882 Decreased libido: Secondary | ICD-10-CM | POA: Diagnosis not present

## 2020-01-18 DIAGNOSIS — E1165 Type 2 diabetes mellitus with hyperglycemia: Secondary | ICD-10-CM | POA: Diagnosis not present

## 2020-01-18 DIAGNOSIS — E039 Hypothyroidism, unspecified: Secondary | ICD-10-CM | POA: Diagnosis not present

## 2020-01-18 DIAGNOSIS — E291 Testicular hypofunction: Secondary | ICD-10-CM | POA: Diagnosis not present

## 2020-01-27 DIAGNOSIS — E291 Testicular hypofunction: Secondary | ICD-10-CM | POA: Diagnosis not present

## 2020-02-14 DIAGNOSIS — E669 Obesity, unspecified: Secondary | ICD-10-CM | POA: Diagnosis not present

## 2020-02-14 DIAGNOSIS — E230 Hypopituitarism: Secondary | ICD-10-CM | POA: Diagnosis not present

## 2020-02-14 DIAGNOSIS — R6882 Decreased libido: Secondary | ICD-10-CM | POA: Diagnosis not present

## 2020-02-16 DIAGNOSIS — E291 Testicular hypofunction: Secondary | ICD-10-CM | POA: Diagnosis not present

## 2020-02-23 ENCOUNTER — Other Ambulatory Visit: Payer: Self-pay | Admitting: Internal Medicine

## 2020-03-01 DIAGNOSIS — E291 Testicular hypofunction: Secondary | ICD-10-CM | POA: Diagnosis not present

## 2020-03-10 DIAGNOSIS — Z20822 Contact with and (suspected) exposure to covid-19: Secondary | ICD-10-CM | POA: Diagnosis not present

## 2020-03-10 DIAGNOSIS — Z1152 Encounter for screening for COVID-19: Secondary | ICD-10-CM | POA: Diagnosis not present

## 2020-03-20 DIAGNOSIS — E291 Testicular hypofunction: Secondary | ICD-10-CM | POA: Diagnosis not present

## 2020-03-24 DIAGNOSIS — E291 Testicular hypofunction: Secondary | ICD-10-CM | POA: Diagnosis not present

## 2020-03-24 DIAGNOSIS — E230 Hypopituitarism: Secondary | ICD-10-CM | POA: Diagnosis not present

## 2020-04-16 ENCOUNTER — Other Ambulatory Visit: Payer: Self-pay | Admitting: Internal Medicine

## 2020-04-16 DIAGNOSIS — F418 Other specified anxiety disorders: Secondary | ICD-10-CM

## 2020-04-18 DIAGNOSIS — E349 Endocrine disorder, unspecified: Secondary | ICD-10-CM | POA: Diagnosis not present

## 2020-04-18 DIAGNOSIS — E039 Hypothyroidism, unspecified: Secondary | ICD-10-CM | POA: Diagnosis not present

## 2020-04-18 DIAGNOSIS — E1165 Type 2 diabetes mellitus with hyperglycemia: Secondary | ICD-10-CM | POA: Diagnosis not present

## 2020-04-18 DIAGNOSIS — Z794 Long term (current) use of insulin: Secondary | ICD-10-CM | POA: Diagnosis not present

## 2020-05-03 ENCOUNTER — Telehealth (HOSPITAL_COMMUNITY): Payer: Self-pay | Admitting: Surgery

## 2020-05-03 NOTE — Telephone Encounter (Signed)
I called patient to ask if he received home sleep study via the mail.  He tells me that he never received.  I notified him that our process had changed.  Also let him know that he would be receiving a call for AHF Clinic follow-up in Dec or January and would address home sleep study during that appt.

## 2020-05-08 DIAGNOSIS — Z85528 Personal history of other malignant neoplasm of kidney: Secondary | ICD-10-CM | POA: Diagnosis not present

## 2020-05-08 DIAGNOSIS — Z79899 Other long term (current) drug therapy: Secondary | ICD-10-CM | POA: Diagnosis not present

## 2020-05-08 DIAGNOSIS — K7689 Other specified diseases of liver: Secondary | ICD-10-CM | POA: Diagnosis not present

## 2020-05-08 DIAGNOSIS — C641 Malignant neoplasm of right kidney, except renal pelvis: Secondary | ICD-10-CM | POA: Diagnosis not present

## 2020-05-08 DIAGNOSIS — Z905 Acquired absence of kidney: Secondary | ICD-10-CM | POA: Diagnosis not present

## 2020-05-08 DIAGNOSIS — R918 Other nonspecific abnormal finding of lung field: Secondary | ICD-10-CM | POA: Diagnosis not present

## 2020-05-08 DIAGNOSIS — Z79890 Hormone replacement therapy (postmenopausal): Secondary | ICD-10-CM | POA: Diagnosis not present

## 2020-05-08 DIAGNOSIS — Z794 Long term (current) use of insulin: Secondary | ICD-10-CM | POA: Diagnosis not present

## 2020-05-08 DIAGNOSIS — C7802 Secondary malignant neoplasm of left lung: Secondary | ICD-10-CM | POA: Diagnosis not present

## 2020-05-08 DIAGNOSIS — D1803 Hemangioma of intra-abdominal structures: Secondary | ICD-10-CM | POA: Diagnosis not present

## 2020-05-09 DIAGNOSIS — C7802 Secondary malignant neoplasm of left lung: Secondary | ICD-10-CM | POA: Diagnosis not present

## 2020-05-09 DIAGNOSIS — C641 Malignant neoplasm of right kidney, except renal pelvis: Secondary | ICD-10-CM | POA: Diagnosis not present

## 2020-05-09 DIAGNOSIS — B369 Superficial mycosis, unspecified: Secondary | ICD-10-CM | POA: Diagnosis not present

## 2020-05-09 DIAGNOSIS — E1065 Type 1 diabetes mellitus with hyperglycemia: Secondary | ICD-10-CM | POA: Diagnosis not present

## 2020-05-10 DIAGNOSIS — H3530 Unspecified macular degeneration: Secondary | ICD-10-CM | POA: Diagnosis not present

## 2020-05-10 DIAGNOSIS — H2513 Age-related nuclear cataract, bilateral: Secondary | ICD-10-CM | POA: Diagnosis not present

## 2020-05-10 DIAGNOSIS — H35711 Central serous chorioretinopathy, right eye: Secondary | ICD-10-CM | POA: Diagnosis not present

## 2020-05-10 DIAGNOSIS — L438 Other lichen planus: Secondary | ICD-10-CM | POA: Diagnosis not present

## 2020-05-10 DIAGNOSIS — H16223 Keratoconjunctivitis sicca, not specified as Sjogren's, bilateral: Secondary | ICD-10-CM | POA: Diagnosis not present

## 2020-05-11 ENCOUNTER — Ambulatory Visit (INDEPENDENT_AMBULATORY_CARE_PROVIDER_SITE_OTHER): Payer: BC Managed Care – PPO

## 2020-05-11 ENCOUNTER — Other Ambulatory Visit: Payer: Self-pay

## 2020-05-11 DIAGNOSIS — Z23 Encounter for immunization: Secondary | ICD-10-CM | POA: Diagnosis not present

## 2020-05-15 ENCOUNTER — Other Ambulatory Visit: Payer: Self-pay

## 2020-05-15 ENCOUNTER — Ambulatory Visit (HOSPITAL_COMMUNITY)
Admission: RE | Admit: 2020-05-15 | Discharge: 2020-05-15 | Disposition: A | Discharge status: Home / Self Care | Payer: BC Managed Care – PPO | Source: Ambulatory Visit | Attending: Internal Medicine | Admitting: Internal Medicine

## 2020-05-15 DIAGNOSIS — I110 Hypertensive heart disease with heart failure: Secondary | ICD-10-CM | POA: Insufficient documentation

## 2020-05-15 DIAGNOSIS — E119 Type 2 diabetes mellitus without complications: Secondary | ICD-10-CM | POA: Insufficient documentation

## 2020-05-15 DIAGNOSIS — I358 Other nonrheumatic aortic valve disorders: Secondary | ICD-10-CM | POA: Diagnosis not present

## 2020-05-15 DIAGNOSIS — I5022 Chronic systolic (congestive) heart failure: Secondary | ICD-10-CM | POA: Diagnosis not present

## 2020-05-15 DIAGNOSIS — E785 Hyperlipidemia, unspecified: Secondary | ICD-10-CM | POA: Insufficient documentation

## 2020-05-15 DIAGNOSIS — I2510 Atherosclerotic heart disease of native coronary artery without angina pectoris: Secondary | ICD-10-CM | POA: Diagnosis not present

## 2020-05-15 LAB — ECHOCARDIOGRAM COMPLETE
AR max vel: 3.42 cm2
AV Area VTI: 3.17 cm2
AV Area mean vel: 3.19 cm2
AV Mean grad: 3 mmHg
AV Peak grad: 4.2 mmHg
Ao pk vel: 1.03 m/s
S' Lateral: 5.5 cm

## 2020-05-15 NOTE — Progress Notes (Signed)
  Echocardiogram 2D Echocardiogram has been performed.  Gregory [name] 05/15/2020, 1:37 PM

## 2020-05-17 ENCOUNTER — Ambulatory Visit (HOSPITAL_COMMUNITY)
Admission: RE | Admit: 2020-05-17 | Discharge: 2020-05-17 | Disposition: A | Discharge status: Home / Self Care | Payer: BC Managed Care – PPO | Source: Ambulatory Visit | Attending: Internal Medicine | Admitting: Internal Medicine

## 2020-05-17 ENCOUNTER — Encounter (HOSPITAL_COMMUNITY): Payer: Self-pay | Admitting: Internal Medicine

## 2020-05-17 ENCOUNTER — Other Ambulatory Visit: Payer: Self-pay

## 2020-05-17 VITALS — BP 130/80 | HR 78 | Wt 274.4 lb

## 2020-05-17 DIAGNOSIS — Z6835 Body mass index (BMI) 35.0-35.9, adult: Secondary | ICD-10-CM | POA: Diagnosis not present

## 2020-05-17 DIAGNOSIS — Z905 Acquired absence of kidney: Secondary | ICD-10-CM | POA: Insufficient documentation

## 2020-05-17 DIAGNOSIS — R0683 Snoring: Secondary | ICD-10-CM | POA: Diagnosis not present

## 2020-05-17 DIAGNOSIS — Z85528 Personal history of other malignant neoplasm of kidney: Secondary | ICD-10-CM | POA: Insufficient documentation

## 2020-05-17 DIAGNOSIS — I5022 Chronic systolic (congestive) heart failure: Secondary | ICD-10-CM

## 2020-05-17 DIAGNOSIS — E669 Obesity, unspecified: Secondary | ICD-10-CM | POA: Diagnosis not present

## 2020-05-17 DIAGNOSIS — Z79890 Hormone replacement therapy (postmenopausal): Secondary | ICD-10-CM | POA: Diagnosis not present

## 2020-05-17 DIAGNOSIS — E099 Drug or chemical induced diabetes mellitus without complications: Secondary | ICD-10-CM | POA: Insufficient documentation

## 2020-05-17 DIAGNOSIS — Z96612 Presence of left artificial shoulder joint: Secondary | ICD-10-CM | POA: Diagnosis not present

## 2020-05-17 DIAGNOSIS — Z794 Long term (current) use of insulin: Secondary | ICD-10-CM | POA: Diagnosis not present

## 2020-05-17 DIAGNOSIS — I110 Hypertensive heart disease with heart failure: Secondary | ICD-10-CM | POA: Insufficient documentation

## 2020-05-17 DIAGNOSIS — Z79899 Other long term (current) drug therapy: Secondary | ICD-10-CM | POA: Diagnosis not present

## 2020-05-17 DIAGNOSIS — I420 Dilated cardiomyopathy: Secondary | ICD-10-CM | POA: Diagnosis not present

## 2020-05-17 DIAGNOSIS — I2510 Atherosclerotic heart disease of native coronary artery without angina pectoris: Secondary | ICD-10-CM | POA: Diagnosis not present

## 2020-05-17 DIAGNOSIS — Z8249 Family history of ischemic heart disease and other diseases of the circulatory system: Secondary | ICD-10-CM | POA: Insufficient documentation

## 2020-05-17 DIAGNOSIS — I10 Essential (primary) hypertension: Secondary | ICD-10-CM

## 2020-05-17 HISTORY — DX: Heart failure, unspecified: I50.9

## 2020-05-17 LAB — BASIC METABOLIC PANEL
Anion gap: 10 (ref 5–15)
BUN: 21 mg/dL — ABNORMAL HIGH (ref 6–20)
CO2: 25 mmol/L (ref 22–32)
Calcium: 9 mg/dL (ref 8.9–10.3)
Chloride: 99 mmol/L (ref 98–111)
Creatinine, Ser: 1.08 mg/dL (ref 0.61–1.24)
GFR, Estimated: >60 mL/min (ref >60)
Glucose, Bld: 379 mg/dL — ABNORMAL HIGH (ref 70–99)
Potassium: 4.9 mmol/L (ref 3.5–5.1)
Sodium: 134 mmol/L — ABNORMAL LOW (ref 135–145)

## 2020-05-17 LAB — BRAIN NATRIURETIC PEPTIDE: B Natriuretic Peptide: 118.9 pg/mL — ABNORMAL HIGH (ref 0.0–100.0)

## 2020-05-17 MED ORDER — CARVEDILOL 12.5 MG PO TABS: 18.7500 mg | ORAL_TABLET | Freq: Two times a day (BID) | ORAL | 1 refills | Status: DC

## 2020-05-17 NOTE — Addendum Note (Signed)
Encounter addended by: Scarlette Calico, RN on: 05/17/2020 11:29 AM  Actions taken: Diagnosis association updated, Order list changed, Clinical Note Signed, Charge Capture section accepted

## 2020-05-17 NOTE — Patient Instructions (Signed)
Increase Carvedilol to 12.5 mg in AM and 18.75 mg (1 & 1/2 tabs) in PM FOR 1 WEEK ONLY, THEN increase to 18.75 mg (1 & 1/2 tabs) Twice daily   Your physician has requested that you have a cardiac MRI. Cardiac MRI uses a computer to create images of your heart as its beating, producing both still and moving pictures of your heart and major blood vessels. For further information please visit [email or web address] Please follow the instruction sheet given to you today for more information. IN 3-4 MONTHS, WE WILL CALL YOU TO SCHEDULE THIS CLOSER TO THE TIMEFRAME  Your physician recommends that you schedule a follow-up appointment in: 3-4 months, we will call you to schedule this when we schedule the MRI  If you have any questions or concerns before your next appointment please send [place] a message through [place] or call our office at [phone].    TO LEAVE A MESSAGE FOR THE NURSE SELECT OPTION 2, PLEASE LEAVE A MESSAGE INCLUDING: . YOUR NAME . DATE OF BIRTH . CALL BACK NUMBER . REASON FOR CALL**this is important as we prioritize the call backs  [place] AS LONG AS YOU CALL BEFORE 4:00 PM  At the [facility], you and your health needs are our priority. As part of our continuing mission to provide you with exceptional heart care, we have created designated Provider Care Teams. These Care Teams include your primary Cardiologist (physician) and Advanced Practice Providers (APPs- Physician Assistants and Nurse Practitioners) who all work together to provide you with the care you need, when you need it.   You may see any of the following providers on your designated Care Team at your next follow up: Marland Kitchen Dr Glori Bickers . Dr Loralie Champagne . Darrick Grinder, NP . Lyda Jester, PA . Audry Riles, PharmD   Please be sure to bring in all your medications bottles to every appointment.

## 2020-05-17 NOTE — Progress Notes (Addendum)
Advanced Heart Failure Clinic Note   Date:  05/17/2020   ID:  [name], DOB Feb 13, 1961, MRN [id number]  Location: Home  Provider location: [place] [facility] Type of Visit: Established patient  PCP:  Gregory Lima, MD  Cardiologist:  No primary care provider on file. Primary HF: Gregory [name]  Chief Complaint: Heart Failure follow-up   History of Present Illness:  Gregory [name] is a 59 y/o male with h/o obesity, metastatic R renal cell CA s/p R nephrectomy in 9/17 with the subsequent discovery of 2 pulmonary nodules whom we follow for non-ischemic cardiomyopathy.  Developed microscopic hematuria and found to have large right renal mass c/w renal cell CA. Eventually underwent right radical nephrectomy with adrenal sparing on 02/07/16 at [facility]. Post-op developed bradycardia with HRs in 30s while sleeping. BP stable. When awakened had dizziness so given atropine. ECG revealed sinus brady in 40s with mild QT prolongation which resolved. Echo done and EF 45% with global HK. RV dilated  With normal function .  We saw him for the first time in October 2017. At that time we ordered cMRI and stress test. However in the interim he went back to [facility]. Found to have 2 metastatic lung nodules Echo showed EF at 46%. However MUGA with EF 28% so referred back to [place] for further evaluation prior to chemo. Underwent cath 04/09/16 as below. Had chemo and lung nodule resection.   1. 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 Otherwise normal coronaries 3. Nonischemic, dilated cardiomyopathy with EF 30-35% and global hypokinesis  cMRI 05/29/16 EF 28% No LGE  Had sleep study which was negative.   Had COVID 03/19/19. Post-COVID seen at [facility] and had CT with ground glass changes felt to be post-COVID changes. Subsequently developed HF symptoms. Had echo 07/29/19 EF 10-15% Mild RV dysfunction. Underwent  aggressive med titration with help of PharmDs. He returns for HF Clinic f/u today. Recently seen at [name]. Cancer in complete remission. Feels good. Got a new puppy and staying active with the dog. No SOB, orthopnea or PND. Compliant with meds.   EF 30% RV normal Personally reviewed  Echo 12/13/19 EF 20-25%. RV normal  Echos:    Echo 5/20 EF 35-40% ECHO 09/12/16 35-40%, RV ok. - Reviewed personally  Echo 05/22/17: EF 50-55%, grade 1 DD.     Past Medical History:  Diagnosis Date  . Allergy   . Anemia    low iron  . Anxiety   . Cancer Gregory [name])    Renal cell cancer  . CHF (congestive heart failure) (Gregory [name])   . Complex tear of lateral meniscus of right knee as current injury 03/20/2017  . Complex tear of medial meniscus of right knee 03/20/2017  . Diabetes mellitus without complication (Gregory [name])   . History of gastrointestinal hemorrhage   . Hypertension   . Hypertriglyceridemia   . Macular degeneration    lasar surgery- Dr Zigmund Sabrinia Prien  . Neuropathy   . Nonischemic cardiomyopathy (Gregory [name])    EF 25-30% by echo 09/12/16  . Plantar warts   . PONV (postoperative nausea and vomiting)    " a little bit of nausea"  . Primary localized osteoarthritis of right knee 03/20/2017  . Renal cell carcinoma [facility])    s/p nephrectomy 02/07/16 at [facility])   Past Surgical History:  Procedure Laterality Date  . CARDIAC CATHETERIZATION N/A 04/19/2016   Procedure: Left  Heart Cath and Coronary Angiography;  Surgeon: Jolaine Artist, MD;  Location: Gregory [name];  Service: Cardiovascular;  Laterality: N/A;  . CHONDROPLASTY Right 03/20/2017   Procedure: CHONDROPLASTY;  Surgeon: Marchia Bond, MD;  Location: [facility] [name];  Service: Orthopedics;  Laterality: Right;  . COLONOSCOPY  03/22/2005  . EYE SURGERY Right    laser surgery, right eye macular degeneration  . KNEE ARTHROSCOPY Right    x 2  . KNEE ARTHROSCOPY WITH MEDIAL MENISECTOMY Right 03/20/2017   Procedure: KNEE ARTHROSCOPY WITH MEDIAL AND  LATERAL MENISECTOMY;  Surgeon: Marchia Bond, MD;  Location: Gregory [name];  Service: Orthopedics;  Laterality: Right;  . NEPHRECTOMY Right   . orif fx fibula    . sebaceous cyst excision  1985  . TONSILLECTOMY    . TOTAL SHOULDER ARTHROPLASTY Left 11/11/2014   Procedure: LEFT TOTAL SHOULDER ARTHROPLASTY;  Surgeon: Netta Cedars, MD;  Location: [name];  Service: Orthopedics;  Laterality: Left;  Marland Kitchen VASECTOMY       Current Outpatient Medications  Medication Sig Dispense Refill  . atorvastatin (LIPITOR) 40 MG tablet Take 40 mg by mouth daily.    . blood glucose meter kit and supplies KIT Dispense based on patient and insurance preference. Use up to four times daily as directed. (FOR ICD-9 250.00, 250.01). 1 each 0  . carvedilol (COREG) 12.5 MG tablet Take 1 tablet (12.5 mg total) by mouth 2 (two) times daily. 180 tablet 3  . cetirizine (ZYRTEC) 10 MG tablet Take 10 mg by mouth daily.    . Cholecalciferol 50 MCG (2000 UT) TABS Take 2 tablets (4,000 Units total) by mouth daily. 180 tablet 1  . Continuous Blood Gluc Sensor (FREESTYLE LIBRE 14 DAY SENSOR) MISC U UTD    . digoxin (LANOXIN) 0.125 MG tablet Take 1 tablet (125 mcg total) by mouth daily. 90 tablet 3  . ENTRESTO 97-103 MG TAKE 1 TABLET BY MOUTH TWICE DAILY 180 tablet 3  . FIASP FLEXTOUCH 100 UNIT/ML FlexTouch Pen     . furosemide (LASIX) 40 MG tablet Take 1 tablet (40 mg total) by mouth 3 (three) times a week. Monday, Wednesday, Friday-may take additional as needed 90 tablet 2  . levocetirizine (XYZAL) 5 MG tablet Take 5 mg by mouth every evening.    Marland Kitchen levothyroxine (SYNTHROID, LEVOTHROID) 25 MCG tablet TAKE 1 TABLET ONCE DAILY BEFORE BREAKFAST 30 tablet 0  . OZEMPIC, 0.25 OR 0.5 MG/DOSE, 2 MG/1.5ML SOPN Inject 1 mg into the skin once a week.     Marland Kitchen PARoxetine (PAXIL) 30 MG tablet TAKE 1 TABLET(30 MG) BY MOUTH DAILY 90 tablet 1  . spironolactone (ALDACTONE) 25 MG tablet Take 0.5 tablets (12.5 mg total) by mouth daily. 45 tablet 3  . thiamine 100  MG tablet Take 100 mg by mouth daily.     No current facility-administered medications for this encounter.    Allergies:   Patient has no known allergies.   Social History:  The patient  reports that he has never smoked. He has never used smokeless tobacco. He reports current alcohol use. He reports that he does not use drugs.   Family History:  The patient's family history includes Bipolar disorder in his mother; Cancer (age of onset: 25) in his mother; Hypertension in his mother; Other in his father.   ROS:  Please see the history of present illness.   All other systems are personally reviewed and negative.   Vitals:   05/17/20 1044  BP: 130/80  Pulse: 78  SpO2: 97%  Weight: 124.5 kg (274 lb 6.4 oz)    Exam:   General:  Well appearing. No resp difficulty HEENT: normal Neck: supple. no JVD. Carotids 2+ bilat; no bruits. No lymphadenopathy or thryomegaly appreciated. Cor: PMI nondisplaced. Regular rate & rhythm. No rubs, gallops or murmurs. Lungs: clear Abdomen: obese soft, nontender, nondistended. No hepatosplenomegaly. No bruits or masses. Good bowel sounds. Extremities: no cyanosis, clubbing, rash, edema Neuro: alert & orientedx3, cranial nerves grossly intact. moves all 4 extremities w/o difficulty. Affect pleasant    Recent Labs: 07/29/2019: Hemoglobin 13.2; Platelets 221 08/31/2019: TSH 2.33 10/27/2019: ALT 18 01/03/2020: B Natriuretic Peptide 135.7; BUN 24; Creatinine, Ser 1.33; Potassium 4.2; Sodium 136  Personally reviewed   Wt Readings from Last 3 Encounters:  05/17/20 124.5 kg (274 lb 6.4 oz)  12/13/19 126.6 kg (279 lb)  10/27/19 125.2 kg (276 lb)      ASSESSMENT AND PLAN:  1. Chronic systolic HF due to NICM   - Unclear etiology. EF 30-35% by cath with NICM. cMRI 1/18 with EF 28%. Etiology remains unclear. No evidence of scar of infiltrative process on MRI. Echo 4/18 EF 35-40%. Echo 04/2017 EF 50-55% - Echo 07/29/19 EF 10-15% Mild RV - Echo 12/13/19 EF 20-25%. RV  normal.  - Echo 12/21 EF 30% Personally reviewed - Volume status much better -  Stable NYHA I-II - Taking lasix daily. Volume status ok  - Increasing carvedilol to 18.75 bid - Continue Entresto 97/103 mg bid.  - Continue spiro 12.5 - No SGLT2i with Type I DM - EF improving. NYHA I-II. Will defer ICD evaluation for now until meds fully titrated  Will do cMRI in 3-4 months  2. Metastatic Renal Cell CA with lung nodules - s/p nephrectomy.  - Followed at [facility]. Felt to be in readmission   3. HTN:  -  Blood pressure well controlled. Med regimen as above.  4. Snoring - 12/17 sleep study negative but has gained significant amount of weight since that time and wife reports snoring is worse - will arrange for home sleep study  5. CAD - 1v in small RCA.  - NNo s/s angina - Continue low-dose ASA and statin  6. Type I DM - Due to loss of pancrease due to immuntherapy. - Followed by Dr. Buddy Duty   Signed, Glori Bickers, MD  05/17/2020 11:02 AM  Advanced Heart Failure [place] [address] [phone] (office) [phone] (fax)

## 2020-06-07 ENCOUNTER — Other Ambulatory Visit (HOSPITAL_COMMUNITY): Payer: Self-pay | Admitting: *Deleted

## 2020-06-07 DIAGNOSIS — I5022 Chronic systolic (congestive) heart failure: Secondary | ICD-10-CM

## 2020-06-14 DIAGNOSIS — L438 Other lichen planus: Secondary | ICD-10-CM | POA: Diagnosis not present

## 2020-06-22 ENCOUNTER — Ambulatory Visit (HOSPITAL_COMMUNITY)
Admission: RE | Admit: 2020-06-22 | Discharge: 2020-06-22 | Disposition: A | Discharge status: Home / Self Care | Payer: BC Managed Care – PPO | Source: Ambulatory Visit | Attending: Internal Medicine | Admitting: Internal Medicine

## 2020-06-22 ENCOUNTER — Other Ambulatory Visit: Payer: Self-pay

## 2020-06-22 ENCOUNTER — Encounter (HOSPITAL_COMMUNITY): Payer: Self-pay | Admitting: Internal Medicine

## 2020-06-22 VITALS — BP 110/84 | HR 64 | Wt 272.2 lb

## 2020-06-22 DIAGNOSIS — Z79890 Hormone replacement therapy (postmenopausal): Secondary | ICD-10-CM | POA: Insufficient documentation

## 2020-06-22 DIAGNOSIS — C7800 Secondary malignant neoplasm of unspecified lung: Secondary | ICD-10-CM | POA: Insufficient documentation

## 2020-06-22 DIAGNOSIS — E891 Postprocedural hypoinsulinemia: Secondary | ICD-10-CM | POA: Diagnosis not present

## 2020-06-22 DIAGNOSIS — Z79899 Other long term (current) drug therapy: Secondary | ICD-10-CM | POA: Insufficient documentation

## 2020-06-22 DIAGNOSIS — I5022 Chronic systolic (congestive) heart failure: Secondary | ICD-10-CM | POA: Diagnosis not present

## 2020-06-22 DIAGNOSIS — Z8616 Personal history of COVID-19: Secondary | ICD-10-CM | POA: Insufficient documentation

## 2020-06-22 DIAGNOSIS — Z85528 Personal history of other malignant neoplasm of kidney: Secondary | ICD-10-CM | POA: Insufficient documentation

## 2020-06-22 DIAGNOSIS — I2510 Atherosclerotic heart disease of native coronary artery without angina pectoris: Secondary | ICD-10-CM | POA: Diagnosis not present

## 2020-06-22 DIAGNOSIS — I110 Hypertensive heart disease with heart failure: Secondary | ICD-10-CM | POA: Insufficient documentation

## 2020-06-22 DIAGNOSIS — Z905 Acquired absence of kidney: Secondary | ICD-10-CM | POA: Diagnosis not present

## 2020-06-22 DIAGNOSIS — E118 Type 2 diabetes mellitus with unspecified complications: Secondary | ICD-10-CM | POA: Diagnosis not present

## 2020-06-22 DIAGNOSIS — Z90410 Acquired total absence of pancreas: Secondary | ICD-10-CM | POA: Insufficient documentation

## 2020-06-22 DIAGNOSIS — I10 Essential (primary) hypertension: Secondary | ICD-10-CM

## 2020-06-22 DIAGNOSIS — R0683 Snoring: Secondary | ICD-10-CM | POA: Insufficient documentation

## 2020-06-22 DIAGNOSIS — E1365 Other specified diabetes mellitus with hyperglycemia: Secondary | ICD-10-CM | POA: Insufficient documentation

## 2020-06-22 DIAGNOSIS — E1165 Type 2 diabetes mellitus with hyperglycemia: Secondary | ICD-10-CM

## 2020-06-22 DIAGNOSIS — I420 Dilated cardiomyopathy: Secondary | ICD-10-CM | POA: Insufficient documentation

## 2020-06-22 DIAGNOSIS — Z96612 Presence of left artificial shoulder joint: Secondary | ICD-10-CM | POA: Insufficient documentation

## 2020-06-22 DIAGNOSIS — Z794 Long term (current) use of insulin: Secondary | ICD-10-CM | POA: Insufficient documentation

## 2020-06-22 DIAGNOSIS — Z8249 Family history of ischemic heart disease and other diseases of the circulatory system: Secondary | ICD-10-CM | POA: Diagnosis not present

## 2020-06-22 DIAGNOSIS — I509 Heart failure, unspecified: Secondary | ICD-10-CM

## 2020-06-22 LAB — BASIC METABOLIC PANEL
Anion gap: 9 (ref 5–15)
BUN: 20 mg/dL (ref 6–20)
CO2: 28 mmol/L (ref 22–32)
Calcium: 9.4 mg/dL (ref 8.9–10.3)
Chloride: 100 mmol/L (ref 98–111)
Creatinine, Ser: 1.22 mg/dL (ref 0.61–1.24)
GFR, Estimated: >60 mL/min (ref >60)
Glucose, Bld: 273 mg/dL — ABNORMAL HIGH (ref 70–99)
Potassium: 4.9 mmol/L (ref 3.5–5.1)
Sodium: 137 mmol/L (ref 135–145)

## 2020-06-22 LAB — HEMOGLOBIN A1C
Hgb A1c MFr Bld: 8.9 % — ABNORMAL HIGH (ref 4.8–5.6)
Mean Plasma Glucose: 208.73 mg/dL

## 2020-06-22 LAB — BRAIN NATRIURETIC PEPTIDE: B Natriuretic Peptide: 84.8 pg/mL (ref 0.0–100.0)

## 2020-06-22 NOTE — Patient Instructions (Signed)
Labs done today, your results will be available in MyChart, we will contact you for abnormal readings.  Your physician recommends that you schedule a follow-up appointment in: 4 months  If you have any questions or concerns before your next appointment please send [place] a message through [place] or call our office at [phone].    TO LEAVE A MESSAGE FOR THE NURSE SELECT OPTION 2, PLEASE LEAVE A MESSAGE INCLUDING: . YOUR NAME . DATE OF BIRTH . CALL BACK NUMBER . REASON FOR CALL**this is important as we prioritize the call backs  [place] AS LONG AS YOU CALL BEFORE 4:00 PM  At the [facility], you and your health needs are our priority. As part of our continuing mission to provide you with exceptional heart care, we have created designated Provider Care Teams. These Care Teams include your primary Cardiologist (physician) and Advanced Practice Providers (APPs- Physician Assistants and Nurse Practitioners) who all work together to provide you with the care you need, when you need it.   You may see any of the following providers on your designated Care Team at your next follow up: Marland Kitchen Dr Glori Bickers . Dr Loralie Champagne . Darrick Grinder, NP . Lyda Jester, [place] . Audry Riles, PharmD   Please be sure to bring in all your medications bottles to every appointment.

## 2020-06-22 NOTE — Addendum Note (Signed)
Encounter addended by: Stanford Scotland, RN on: 06/22/2020 12:36 PM  Actions taken: Visit diagnoses modified, Order list changed, Diagnosis association updated, Charge Capture section accepted, Clinical Note Signed

## 2020-06-22 NOTE — Progress Notes (Signed)
Advanced Heart Failure Clinic Note   Date:  06/22/2020   ID:  Gregory [name], DOB [birth year], MRN [id number]  Location: Home  Provider location: [place] [facility] Type of Visit: Established patient  PCP:  [name], MD  Cardiologist:  No primary care provider on file. Primary HF: Jessina Marse  Chief Complaint: Heart Failure follow-up   History of Present Illness:  Gregory [name] is a 60 y/o male with h/o obesity, metastatic R renal cell CA s/p R nephrectomy in 9/17 with the subsequent discovery of 2 pulmonary nodules whom we follow for non-ischemic cardiomyopathy.  Developed microscopic hematuria and found to have large right renal mass c/w renal cell CA. Eventually underwent right radical nephrectomy with adrenal sparing on 02/07/16 at [facility]. Post-op developed bradycardia with HRs in 30s while sleeping. BP stable. When awakened had dizziness so given atropine. ECG revealed sinus brady in 40s with mild QT prolongation which resolved. Echo done and EF 45% with global HK. RV dilated  With normal function .  We saw him for the first time in October 2017. At that time we ordered cMRI and stress test. However in the interim he went back to [facility]. Found to have 2 metastatic lung nodules Echo showed EF at 46%. However MUGA with EF 28% so referred back to [place] for further evaluation prior to chemo. Underwent cath 04/09/16 as below. Had chemo and lung nodule resection.   1. 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 Otherwise normal coronaries 3. Nonischemic, dilated cardiomyopathy with EF 30-35% and global hypokinesis  cMRI 05/29/16 EF 28% No LGE  Had sleep study which was negative.   Had COVID 03/19/19. Post-COVID seen at [facility] and had CT with ground glass changes felt to be post-COVID changes. Subsequently developed HF symptoms. Had echo 07/29/19 EF 10-15% Mild RV dysfunction. Underwent  aggressive med titration with help of PharmDs.  EF 12/21 EF 30% RV normal   Echo 12/13/19 EF 20-25%. RV normal  He returns for HF Clinic f/u today. Says he feels pretty good. Going to [employer]. Doing 1hr of cardio 3x/week. Denies SOB/CP. Watching diet more closely. No edema, orthopnea or PND. Compliant with meds. Working FT in [employer]. Mild dyspnea if going up steps with bags. Taking lasix 40 daily.    Echos:    Echo 5/20 EF 35-40% ECHO 09/12/16 35-40%, RV ok. - Reviewed personally  Echo 05/22/17: EF 50-55%, grade 1 DD.     Past Medical History:  Diagnosis Date  . Allergy   . Anemia    low iron  . Anxiety   . Cancer [facility])    Renal cell cancer  . CHF (congestive heart failure) ([place])   . Complex tear of lateral meniscus of right knee as current injury 03/20/2017  . Complex tear of medial meniscus of right knee 03/20/2017  . Diabetes mellitus without complication ([place])   . History of gastrointestinal hemorrhage   . Hypertension   . Hypertriglyceridemia   . Macular degeneration    lasar surgery- Dr Zigmund Daryll Spisak  . Neuropathy   . Nonischemic cardiomyopathy ([place])    EF 25-30% by echo 09/12/16  . Plantar warts   . PONV (postoperative nausea and vomiting)    " a little bit of nausea"  . Primary localized osteoarthritis of right knee 03/20/2017  . Renal cell carcinoma [facility])    s/p nephrectomy 02/07/16 at [facility])  Past Surgical History:  Procedure Laterality Date  . CARDIAC CATHETERIZATION N/A 04/19/2016   Procedure: Left Heart Cath and Coronary Angiography;  Surgeon: Jolaine Artist, MD;  Location: [place] CV LAB;  Service: Cardiovascular;  Laterality: N/A;  . CHONDROPLASTY Right 03/20/2017   Procedure: CHONDROPLASTY;  Surgeon: Marchia Bond, MD;  Location: [place];  Service: Orthopedics;  Laterality: Right;  . COLONOSCOPY  03/22/2005  . EYE SURGERY Right    laser surgery, right eye macular degeneration  . KNEE ARTHROSCOPY Right    x 2  . KNEE  ARTHROSCOPY WITH MEDIAL MENISECTOMY Right 03/20/2017   Procedure: KNEE ARTHROSCOPY WITH MEDIAL AND LATERAL MENISECTOMY;  Surgeon: Marchia Bond, MD;  Location: [place];  Service: Orthopedics;  Laterality: Right;  . NEPHRECTOMY Right   . orif fx fibula    . sebaceous cyst excision  1985  . TONSILLECTOMY    . TOTAL SHOULDER ARTHROPLASTY Left 11/11/2014   Procedure: LEFT TOTAL SHOULDER ARTHROPLASTY;  Surgeon: Netta Cedars, MD;  Location: [place];  Service: Orthopedics;  Laterality: Left;  Marland Kitchen VASECTOMY       Current Outpatient Medications  Medication Sig Dispense Refill  . atorvastatin (LIPITOR) 40 MG tablet Take 40 mg by mouth daily.    . blood glucose meter kit and supplies KIT Dispense based on patient and insurance preference. Use up to four times daily as directed. (FOR ICD-9 250.00, 250.01). 1 each 0  . carvedilol (COREG) 12.5 MG tablet Take 1.5 tablets (18.75 mg total) by mouth 2 (two) times daily. 90270 tablet 1  . cetirizine (ZYRTEC) 10 MG tablet Take 10 mg by mouth daily.    . Cholecalciferol 50 MCG (2000 UT) TABS Take 2 tablets (4,000 Units total) by mouth daily. 180 tablet 1  . Continuous Blood Gluc Sensor (FREESTYLE LIBRE 14 DAY SENSOR) MISC U UTD    . digoxin (LANOXIN) 0.125 MG tablet Take 1 tablet (125 mcg total) by mouth daily. 90 tablet 3  . ENTRESTO 97-103 MG TAKE 1 TABLET BY MOUTH TWICE DAILY 180 tablet 3  . FIASP FLEXTOUCH 100 UNIT/ML FlexTouch Pen     . furosemide (LASIX) 40 MG tablet Take 1 tablet (40 mg total) by mouth 3 (three) times a week. Monday, Wednesday, Friday-may take additional as needed 90 tablet 2  . levocetirizine (XYZAL) 5 MG tablet Take 5 mg by mouth every evening.    Marland Kitchen levothyroxine (SYNTHROID, LEVOTHROID) 25 MCG tablet TAKE 1 TABLET ONCE DAILY BEFORE BREAKFAST 30 tablet 0  . OZEMPIC, 0.25 OR 0.5 MG/DOSE, 2 MG/1.5ML SOPN Inject 1 mg into the skin once a week.     Marland Kitchen PARoxetine (PAXIL) 30 MG tablet TAKE 1 TABLET(30 MG) BY MOUTH DAILY 90 tablet 1  .  spironolactone (ALDACTONE) 25 MG tablet Take 0.5 tablets (12.5 mg total) by mouth daily. 45 tablet 3  . thiamine 100 MG tablet Take 100 mg by mouth daily.     No current facility-administered medications for this encounter.    Allergies:   Patient has no known allergies.   Social History:  The patient  reports that he has never smoked. He has never used smokeless tobacco. He reports current alcohol use. He reports that he does not use drugs.   Family History:  The patient's family history includes Bipolar disorder in his mother; Cancer (age of onset: 31) in his mother; Hypertension in his mother; Other in his father.   ROS:  Please see the history of present illness.   All other systems are  personally reviewed and negative.   Vitals:   06/22/20 1150  BP: 110/84  Pulse: 64  SpO2: 97%  Weight: 123.5 kg (272 lb 3.2 oz)    Exam:   General:  Well appearing. No resp difficulty HEENT: normal Neck: supple. no JVD. Carotids 2+ bilat; no bruits. No lymphadenopathy or thryomegaly appreciated. Cor: PMI nondisplaced. Regular rate & rhythm. No rubs, gallops or murmurs. Lungs: clear Abdomen: obese soft, nontender, nondistended. No hepatosplenomegaly. No bruits or masses. Good bowel sounds. Extremities: no cyanosis, clubbing, rash, edema Neuro: alert & orientedx3, cranial nerves grossly intact. moves all 4 extremities w/o difficulty. Affect pleasan  Recent Labs: 07/29/2019: Hemoglobin 13.2; Platelets 221 08/31/2019: TSH 2.33 10/27/2019: ALT 18 05/17/2020: B Natriuretic Peptide 118.9; BUN 21; Creatinine, Ser 1.08; Potassium 4.9; Sodium 134  Personally reviewed   Wt Readings from Last 3 Encounters:  06/22/20 123.5 kg (272 lb 3.2 oz)  05/17/20 124.5 kg (274 lb 6.4 oz)  12/13/19 126.6 kg (279 lb)      ASSESSMENT AND PLAN:  1. Chronic systolic HF due to NICM   - Unclear etiology. EF 30-35% by cath with NICM. cMRI 1/18 with EF 28%. Etiology remains unclear. No evidence of scar of infiltrative  process on MRI. Echo 4/18 EF 35-40%. Echo 04/2017 EF 50-55% - Echo 07/29/19 EF 10-15% Mild RV - Echo 12/13/19 EF 20-25%. RV normal.  - Echo 12/21 EF 30% Personally reviewed - Volume status looks good - Stable NYHA II - Taking lasix daily. Volume status ok  - Continue carvedilol to 18.75 bid - Continue Entresto 97/103 mg bid.  - Continue spiro 12.5 will not increase with K 4.9 - No SGLT2i with Type I DM - EF improving. NYHA I-II. Will defer ICD evaluation for now until meds fully titrated  cMRI planned for 07/04/20 - Labs today  2. Metastatic Renal Cell CA with lung nodules - s/p nephrectomy.  - Followed at [facility]. Felt to be in readmission  - No change  3. HTN:  -  Blood pressure well controlled. Continue current regimen.  4. Snoring - 12/17 sleep study negative but has gained significant amount of weight since that time and wife reports snoring is worse - will arrange for home sleep study  5. CAD - 1v in small RCA.  - No s/s angina - Continue low-dose ASA and statin  6. Type I DM - Due to loss of pancrease due to immuntherapy. - Followed by Dr. Buddy Duty - Check HGBa1c  Signed, Glori Bickers, MD  06/22/2020 11:41 AM  Advanced Heart Failure [place] and [place] [address] [phone] (office) [phone] (fax)

## 2020-07-03 ENCOUNTER — Telehealth (HOSPITAL_COMMUNITY): Payer: Self-pay | Admitting: Emergency Medicine

## 2020-07-03 NOTE — Telephone Encounter (Signed)
Reaching out to patient to offer assistance regarding upcoming cardiac imaging study; pt verbalizes understanding of appt date/time, parking situation and where to check in, pre-test NPO status and medications ordered, and verified current allergies; name and call back number provided for further questions should they arise Gregory Bond RN Navigator Cardiac Imaging [facility] [phone] office [phone] cell  Pt denies claustro, has freestyle libre sensor which he will remove in the morning prior to scan Clarise Cruz

## 2020-07-04 ENCOUNTER — Other Ambulatory Visit: Payer: Self-pay

## 2020-07-04 ENCOUNTER — Ambulatory Visit (HOSPITAL_COMMUNITY)
Admission: RE | Admit: 2020-07-04 | Discharge: 2020-07-04 | Disposition: A | Discharge status: Home / Self Care | Payer: BC Managed Care – PPO | Source: Ambulatory Visit | Attending: Cardiology | Admitting: Cardiology

## 2020-07-04 DIAGNOSIS — I5022 Chronic systolic (congestive) heart failure: Secondary | ICD-10-CM | POA: Diagnosis not present

## 2020-07-04 MED ORDER — GADOBUTROL 1 MMOL/ML IV SOLN
10.0000 mL | Freq: Once | INTRAVENOUS | Status: AC | PRN
  Administered 2020-07-04: 10 mL via INTRAVENOUS

## 2020-07-10 ENCOUNTER — Other Ambulatory Visit: Payer: Self-pay | Admitting: Internal Medicine

## 2020-07-19 DIAGNOSIS — E1165 Type 2 diabetes mellitus with hyperglycemia: Secondary | ICD-10-CM | POA: Diagnosis not present

## 2020-07-19 DIAGNOSIS — E039 Hypothyroidism, unspecified: Secondary | ICD-10-CM | POA: Diagnosis not present

## 2020-07-19 DIAGNOSIS — E349 Endocrine disorder, unspecified: Secondary | ICD-10-CM | POA: Diagnosis not present

## 2020-07-19 DIAGNOSIS — Z794 Long term (current) use of insulin: Secondary | ICD-10-CM | POA: Diagnosis not present

## 2020-07-20 DIAGNOSIS — L905 Scar conditions and fibrosis of skin: Secondary | ICD-10-CM | POA: Diagnosis not present

## 2020-07-20 DIAGNOSIS — L814 Other melanin hyperpigmentation: Secondary | ICD-10-CM | POA: Diagnosis not present

## 2020-07-20 DIAGNOSIS — D225 Melanocytic nevi of trunk: Secondary | ICD-10-CM | POA: Diagnosis not present

## 2020-07-20 DIAGNOSIS — Z85828 Personal history of other malignant neoplasm of skin: Secondary | ICD-10-CM | POA: Diagnosis not present

## 2020-07-22 DIAGNOSIS — E6609 Other obesity due to excess calories: Secondary | ICD-10-CM | POA: Diagnosis not present

## 2020-08-19 ENCOUNTER — Other Ambulatory Visit (HOSPITAL_COMMUNITY): Payer: Self-pay | Admitting: Internal Medicine

## 2020-08-23 DIAGNOSIS — Z794 Long term (current) use of insulin: Secondary | ICD-10-CM | POA: Diagnosis not present

## 2020-08-23 DIAGNOSIS — E039 Hypothyroidism, unspecified: Secondary | ICD-10-CM | POA: Diagnosis not present

## 2020-08-23 DIAGNOSIS — E349 Endocrine disorder, unspecified: Secondary | ICD-10-CM | POA: Diagnosis not present

## 2020-08-23 DIAGNOSIS — E1165 Type 2 diabetes mellitus with hyperglycemia: Secondary | ICD-10-CM | POA: Diagnosis not present

## 2020-09-27 DIAGNOSIS — E039 Hypothyroidism, unspecified: Secondary | ICD-10-CM | POA: Diagnosis not present

## 2020-09-27 DIAGNOSIS — E349 Endocrine disorder, unspecified: Secondary | ICD-10-CM | POA: Diagnosis not present

## 2020-09-27 DIAGNOSIS — Z794 Long term (current) use of insulin: Secondary | ICD-10-CM | POA: Diagnosis not present

## 2020-09-27 DIAGNOSIS — E1165 Type 2 diabetes mellitus with hyperglycemia: Secondary | ICD-10-CM | POA: Diagnosis not present

## 2020-10-18 ENCOUNTER — Other Ambulatory Visit: Payer: Self-pay | Admitting: Internal Medicine

## 2020-10-18 ENCOUNTER — Other Ambulatory Visit (HOSPITAL_COMMUNITY): Payer: Self-pay | Admitting: Internal Medicine

## 2020-10-18 DIAGNOSIS — E785 Hyperlipidemia, unspecified: Secondary | ICD-10-CM

## 2020-10-18 DIAGNOSIS — F418 Other specified anxiety disorders: Secondary | ICD-10-CM

## 2020-10-23 ENCOUNTER — Other Ambulatory Visit (HOSPITAL_COMMUNITY): Payer: Self-pay | Admitting: Internal Medicine

## 2020-10-25 ENCOUNTER — Ambulatory Visit (HOSPITAL_COMMUNITY)
Admission: RE | Admit: 2020-10-25 | Discharge: 2020-10-25 | Disposition: A | Discharge status: Home / Self Care | Payer: BC Managed Care – PPO | Source: Ambulatory Visit | Attending: Internal Medicine | Admitting: Internal Medicine

## 2020-10-25 ENCOUNTER — Encounter (HOSPITAL_COMMUNITY): Payer: Self-pay | Admitting: Internal Medicine

## 2020-10-25 ENCOUNTER — Other Ambulatory Visit: Payer: Self-pay

## 2020-10-25 VITALS — BP 130/70 | HR 80 | Wt 262.0 lb

## 2020-10-25 DIAGNOSIS — I110 Hypertensive heart disease with heart failure: Secondary | ICD-10-CM | POA: Diagnosis not present

## 2020-10-25 DIAGNOSIS — E109 Type 1 diabetes mellitus without complications: Secondary | ICD-10-CM | POA: Insufficient documentation

## 2020-10-25 DIAGNOSIS — I2510 Atherosclerotic heart disease of native coronary artery without angina pectoris: Secondary | ICD-10-CM | POA: Diagnosis not present

## 2020-10-25 DIAGNOSIS — I428 Other cardiomyopathies: Secondary | ICD-10-CM | POA: Insufficient documentation

## 2020-10-25 DIAGNOSIS — Z8249 Family history of ischemic heart disease and other diseases of the circulatory system: Secondary | ICD-10-CM | POA: Insufficient documentation

## 2020-10-25 DIAGNOSIS — Z8616 Personal history of COVID-19: Secondary | ICD-10-CM | POA: Insufficient documentation

## 2020-10-25 DIAGNOSIS — Z79890 Hormone replacement therapy (postmenopausal): Secondary | ICD-10-CM | POA: Diagnosis not present

## 2020-10-25 DIAGNOSIS — Z9221 Personal history of antineoplastic chemotherapy: Secondary | ICD-10-CM | POA: Diagnosis not present

## 2020-10-25 DIAGNOSIS — Z79899 Other long term (current) drug therapy: Secondary | ICD-10-CM | POA: Insufficient documentation

## 2020-10-25 DIAGNOSIS — Z794 Long term (current) use of insulin: Secondary | ICD-10-CM | POA: Diagnosis not present

## 2020-10-25 DIAGNOSIS — I5022 Chronic systolic (congestive) heart failure: Secondary | ICD-10-CM | POA: Diagnosis not present

## 2020-10-25 DIAGNOSIS — Z905 Acquired absence of kidney: Secondary | ICD-10-CM | POA: Insufficient documentation

## 2020-10-25 DIAGNOSIS — I10 Essential (primary) hypertension: Secondary | ICD-10-CM

## 2020-10-25 DIAGNOSIS — Z85118 Personal history of other malignant neoplasm of bronchus and lung: Secondary | ICD-10-CM | POA: Diagnosis not present

## 2020-10-25 DIAGNOSIS — R0683 Snoring: Secondary | ICD-10-CM | POA: Insufficient documentation

## 2020-10-25 DIAGNOSIS — E669 Obesity, unspecified: Secondary | ICD-10-CM | POA: Insufficient documentation

## 2020-10-25 DIAGNOSIS — Z85528 Personal history of other malignant neoplasm of kidney: Secondary | ICD-10-CM | POA: Diagnosis not present

## 2020-10-25 NOTE — Patient Instructions (Signed)
No Labs done today.   No medication changes were made. Please continue all current medications as prescribed.  Your physician recommends that you schedule a follow-up appointment in: 6 months with an echo prior to your appointment. Please contact our office in November to schedule a December appointment.  Your physician has requested that you have an echocardiogram. Echocardiography is a painless test that uses sound waves to create images of your heart. It provides your doctor with information about the size and shape of your heart and how well your heart's chambers and valves are working. This procedure takes approximately one hour. There are no restrictions for this procedure.  If you have any questions or concerns before your next appointment please send [place] a message through [place] or call our office at [phone].    TO LEAVE A MESSAGE FOR THE NURSE SELECT OPTION 2, PLEASE LEAVE A MESSAGE INCLUDING: . YOUR NAME . DATE OF BIRTH . CALL BACK NUMBER . REASON FOR CALL**this is important as we prioritize the call backs  YOU WILL RECEIVE A CALL BACK THE SAME DAY AS LONG AS YOU CALL BEFORE 4:00 PM   Do the following things EVERYDAY: 1) Weigh yourself in the morning before breakfast. Write it down and keep it in a log. 2) Take your medicines as prescribed 3) Eat low salt foods--Limit salt (sodium) to 2000 mg per day.  4) Stay as active as you can everyday 5) Limit all fluids for the day to less than 2 liters   At the [facility], you and your health needs are our priority. As part of our continuing mission to provide you with exceptional heart care, we have created designated Provider Care Teams. These Care Teams include your primary Cardiologist (physician) and Advanced Practice Providers (APPs- Physician Assistants and Nurse Practitioners) who all work together to provide you with the care you need, when you need it.   You may see any of the following providers on your  designated Care Team at your next follow up: Marland Kitchen Dr Glori Bickers . Dr Loralie Champagne . Darrick Grinder, NP . Lyda Jester, PA . Audry Riles, PharmD   Please be sure to bring in all your medications bottles to every appointment.

## 2020-10-25 NOTE — Progress Notes (Signed)
Advanced Heart Failure Clinic Note   Date:  10/25/2020   ID:  Gregory [name], DOB [birth year], MRN [id number]  Location: Home  Provider location: [name] [facility] Type of Visit: Established patient  PCP:  Gregory Lima, MD  Cardiologist:  None Primary HF: Gregory [name]  Chief Complaint: Heart Failure follow-up   History of Present Illness:  Gregory [name] is a 60 y/o male with h/o obesity, metastatic R renal cell CA s/p R nephrectomy in 9/17 with the subsequent discovery of 2 pulmonary nodules whom we follow for non-ischemic cardiomyopathy.  Developed microscopic hematuria and found to have large right renal mass c/w renal cell CA. Eventually underwent right radical nephrectomy with adrenal sparing on 02/07/16 at [facility]. Post-op developed bradycardia with HRs in 30s while sleeping. BP stable. When awakened had dizziness so given atropine. ECG revealed sinus brady in 40s with mild QT prolongation which resolved. Echo done and EF 45% with global HK. RV dilated  With normal function .  We saw him for the first time in October 2017. At that time we ordered cMRI and stress test. However in the interim he went back to [facility]. Found to have 2 metastatic lung nodules Echo showed EF at 46%. However MUGA with EF 28% so referred back to [place] for further evaluation prior to chemo. Underwent cath 04/09/16 as below. Had chemo and lung nodule resection.   1. 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 Otherwise normal coronaries 3. Nonischemic, dilated cardiomyopathy with EF 30-35% and global hypokinesis  EF 12/21 EF 30% RV normal  Echo 12/13/19 EF 20-25%. RV normal  cMRI 05/29/16 EF 28% No LGE cMRI 2/22 EF 20% RV 20% No LGE   Had sleep study which was negative.   Had COVID 03/19/19. Post-COVID seen at [facility] and had CT with ground glass changes felt to be post-COVID changes. Subsequently developed HF  symptoms. Had echo 07/29/19 EF 10-15% Mild RV dysfunction. Underwent aggressive med titration with help of PharmDs  He returns for HF Clinic f/u today. He is here with his wife. She says he is not very active. Only been to the gym once. Denies CP or SON. No orthopnea or PND. Compliant with meds.    Echos:    Echo 5/20 EF 35-40% ECHO 09/12/16 35-40%, RV ok. - Reviewed personally  Echo 05/22/17: EF 50-55%, grade 1 DD.     Past Medical History:  Diagnosis Date  . Allergy   . Anemia    low iron  . Anxiety   . Cancer Gregory [name])    Renal cell cancer  . CHF (congestive heart failure) ([name])   . Complex tear of lateral meniscus of right knee as current injury 03/20/2017  . Complex tear of medial meniscus of right knee 03/20/2017  . Diabetes mellitus without complication ([name])   . History of gastrointestinal hemorrhage   . Hypertension   . Hypertriglyceridemia   . Macular degeneration    lasar surgery- Dr Zigmund Sylvester [place]  . Neuropathy   . Nonischemic cardiomyopathy ([name])    EF 25-30% by echo 09/12/16  . Plantar warts   . PONV (postoperative nausea and vomiting)    " a little bit of nausea"  . Primary localized osteoarthritis of right knee 03/20/2017  . Renal cell carcinoma [name])    s/p nephrectomy 02/07/16 at [facility])   Past Surgical History:  Procedure Laterality Date  . CARDIAC  CATHETERIZATION N/A 04/19/2016   Procedure: Left Heart Cath and Coronary Angiography;  Surgeon: Jolaine Artist, MD;  Location: [place] [facility] LAB;  Service: Cardiovascular;  Laterality: N/A;  . CHONDROPLASTY Right 03/20/2017   Procedure: CHONDROPLASTY;  Surgeon: Marchia Bond, MD;  Location: [place];  Service: Orthopedics;  Laterality: Right;  . COLONOSCOPY  03/22/2005  . EYE SURGERY Right    laser surgery, right eye macular degeneration  . KNEE ARTHROSCOPY Right    x 2  . KNEE ARTHROSCOPY WITH MEDIAL MENISECTOMY Right 03/20/2017   Procedure: KNEE ARTHROSCOPY WITH MEDIAL AND LATERAL MENISECTOMY;  Surgeon:  Marchia Bond, MD;  Location: [place];  Service: Orthopedics;  Laterality: Right;  . NEPHRECTOMY Right   . orif fx fibula    . sebaceous cyst excision  1985  . TONSILLECTOMY    . TOTAL SHOULDER ARTHROPLASTY Left 11/11/2014   Procedure: LEFT TOTAL SHOULDER ARTHROPLASTY;  Surgeon: Netta Cedars, MD;  Location: [place];  Service: Orthopedics;  Laterality: Left;  Marland Kitchen VASECTOMY       Current Outpatient Medications  Medication Sig Dispense Refill  . atorvastatin (LIPITOR) 20 MG tablet TAKE 1 TABLET(20 MG) BY MOUTH AT BEDTIME 90 tablet 0  . atorvastatin (LIPITOR) 40 MG tablet Take 40 mg by mouth daily.    . blood glucose meter kit and supplies KIT Dispense based on patient and insurance preference. Use up to four times daily as directed. (FOR ICD-9 250.00, 250.01). 1 each 0  . carvedilol (COREG) 12.5 MG tablet TAKE 1.5 TABLETS BY MOUTH TWICE DAILY 270 tablet 3  . cetirizine (ZYRTEC) 10 MG tablet Take 10 mg by mouth daily.    . Cholecalciferol 50 MCG (2000 UT) TABS Take 2 tablets (4,000 Units total) by mouth daily. 180 tablet 1  . Continuous Blood Gluc Sensor (FREESTYLE LIBRE 14 DAY SENSOR) MISC U UTD    . digoxin (LANOXIN) 0.125 MG tablet TAKE 1 TABLET(125 MCG) BY MOUTH DAILY 90 tablet 3  . ENTRESTO 97-103 MG TAKE 1 TABLET BY MOUTH TWICE DAILY 180 tablet 3  . fluticasone (CUTIVATE) 0.005 % ointment Apply topically.    . furosemide (LASIX) 40 MG tablet Take 1 tablet (40 mg total) by mouth 3 (three) times a week. Monday, Wednesday, Friday-may take additional as needed 90 tablet 2  . Insulin Aspart, w/Niacinamide, (FIASP [place]) Inject 24 Units into the skin. W sliding scale as needed    . levocetirizine (XYZAL) 5 MG tablet Take 5 mg by mouth every evening.    Marland Kitchen levothyroxine (SYNTHROID, LEVOTHROID) 25 MCG tablet TAKE 1 TABLET ONCE DAILY BEFORE BREAKFAST 30 tablet 0  . PARoxetine (PAXIL) 30 MG tablet TAKE 1 TABLET(30 MG) BY MOUTH DAILY 90 tablet 0  . Semaglutide (OZEMPIC, 0.25 OR 0.5 MG/DOSE, [place]) Inject 2 mg  into the skin once a week.    . spironolactone (ALDACTONE) 25 MG tablet Take 0.5 tablets (12.5 mg total) by mouth daily. 45 tablet 3  . thiamine 100 MG tablet Take 100 mg by mouth daily.    . VUITY 1.25 % SOLN Instill 1 drop into both eyes once a day    . XIIDRA 5 % SOLN 1 drop  twice a day     No current facility-administered medications for this encounter.    Allergies:   Patient has no known allergies.   Social History:  The patient  reports that he has never smoked. He has never used smokeless tobacco. He reports current alcohol use. He reports that he does not  use drugs.   Family History:  The patient's family history includes Bipolar disorder in his mother; Cancer (age of onset: 45) in his mother; Hypertension in his mother; Other in his father.   ROS:  Please see the history of present illness.   All other systems are personally reviewed and negative.   Vitals:   10/25/20 1148  BP: 130/70  Pulse: 80  SpO2: 97%  Weight: 118.8 kg (262 lb)    Exam:   General:  Well appearing. No resp difficulty HEENT: normal Neck: supple. no JVD. Carotids 2+ bilat; no bruits. No lymphadenopathy or thryomegaly appreciated. Cor: PMI nondisplaced. Regular rate & rhythm. No rubs, gallops or murmurs. Lungs: clear Abdomen: obese soft, nontender, nondistended. No hepatosplenomegaly. No bruits or masses. Good bowel sounds. Extremities: no cyanosis, clubbing, rash, edema Neuro: alert & orientedx3, cranial nerves grossly intact. moves all 4 extremities w/o difficulty. Affect pleasant   Recent Labs: 10/27/2019: ALT 18 06/22/2020: B Natriuretic Peptide 84.8; BUN 20; Creatinine, Ser 1.22; Potassium 4.9; Sodium 137  Personally reviewed   Wt Readings from Last 3 Encounters:  10/25/20 118.8 kg (262 lb)  06/22/20 123.5 kg (272 lb 3.2 oz)  05/17/20 124.5 kg (274 lb 6.4 oz)      ASSESSMENT AND PLAN:  1. Chronic systolic HF due to NICM   - Unclear etiology. EF 30-35% by cath with NICM. cMRI 1/18 with  EF 28%. Etiology remains unclear. No evidence of scar of infiltrative process on MRI. Echo 4/18 EF 35-40%. Echo 04/2017 EF 50-55% - Echo 07/29/19 EF 10-15% Mild RV - Echo 12/13/19 EF 20-25%. RV normal.  - Echo 12/21 EF 30% Personally reviewed - cMRI 2/22 EF 20% RV 20% No LGE  - Stable NYHA II despite persistent severe LV dysfunction - Volume status ok  - Continue carvedilol to 18.75 bid - Continue Entresto 97/103 mg bid.  - Continue spiro 12.5 will not increase with K 4.9 - No SGLT2i with Type I DM - We again discussed pros/cons of ICD. He wants to defer - Encouraged routine exercise  2. Metastatic Renal Cell CA with lung nodules - s/p nephrectomy.  - Followed at [facility]. Felt to be in readmission  - Has f/u at [facility] this month  3. HTN:  -  Blood pressure well controlled. Continue current regimen.  4. Snoring - need to get results of sleep study  5. CAD - 1v in small RCA.  - No s/s angina - Continue low-dose ASA and statin  6. Type I DM - Due to loss of pancrease due to immuntherapy. - Followed by Dr. Buddy Duty   Signed, Glori Bickers, MD  10/25/2020 12:29 PM  Advanced Heart Failure [place] [address] [facility] [place] [address] [phone] (office) [phone] (fax)

## 2020-10-26 LAB — HM DIABETES EYE EXAM

## 2020-11-06 DIAGNOSIS — C7802 Secondary malignant neoplasm of left lung: Secondary | ICD-10-CM | POA: Diagnosis not present

## 2020-11-06 DIAGNOSIS — Z7984 Long term (current) use of oral hypoglycemic drugs: Secondary | ICD-10-CM | POA: Diagnosis not present

## 2020-11-06 DIAGNOSIS — Z79899 Other long term (current) drug therapy: Secondary | ICD-10-CM | POA: Diagnosis not present

## 2020-11-06 DIAGNOSIS — Z794 Long term (current) use of insulin: Secondary | ICD-10-CM | POA: Diagnosis not present

## 2020-11-06 DIAGNOSIS — C641 Malignant neoplasm of right kidney, except renal pelvis: Secondary | ICD-10-CM | POA: Diagnosis not present

## 2020-11-06 DIAGNOSIS — Z79890 Hormone replacement therapy (postmenopausal): Secondary | ICD-10-CM | POA: Diagnosis not present

## 2020-11-07 DIAGNOSIS — Z85118 Personal history of other malignant neoplasm of bronchus and lung: Secondary | ICD-10-CM | POA: Diagnosis not present

## 2020-11-07 DIAGNOSIS — C7802 Secondary malignant neoplasm of left lung: Secondary | ICD-10-CM | POA: Diagnosis not present

## 2020-11-07 DIAGNOSIS — D509 Iron deficiency anemia, unspecified: Secondary | ICD-10-CM | POA: Diagnosis not present

## 2020-11-07 DIAGNOSIS — E668 Other obesity: Secondary | ICD-10-CM | POA: Diagnosis not present

## 2020-11-07 DIAGNOSIS — C641 Malignant neoplasm of right kidney, except renal pelvis: Secondary | ICD-10-CM | POA: Diagnosis not present

## 2020-11-14 DIAGNOSIS — E1165 Type 2 diabetes mellitus with hyperglycemia: Secondary | ICD-10-CM | POA: Diagnosis not present

## 2020-11-14 DIAGNOSIS — E349 Endocrine disorder, unspecified: Secondary | ICD-10-CM | POA: Diagnosis not present

## 2020-11-14 DIAGNOSIS — E039 Hypothyroidism, unspecified: Secondary | ICD-10-CM | POA: Diagnosis not present

## 2020-11-14 DIAGNOSIS — Z794 Long term (current) use of insulin: Secondary | ICD-10-CM | POA: Diagnosis not present

## 2020-11-22 ENCOUNTER — Other Ambulatory Visit: Payer: Self-pay

## 2020-11-22 ENCOUNTER — Encounter (INDEPENDENT_AMBULATORY_CARE_PROVIDER_SITE_OTHER): Payer: BC Managed Care – PPO | Admitting: Ophthalmology

## 2020-11-22 DIAGNOSIS — H353211 Exudative age-related macular degeneration, right eye, with active choroidal neovascularization: Secondary | ICD-10-CM | POA: Diagnosis not present

## 2020-11-22 DIAGNOSIS — H353121 Nonexudative age-related macular degeneration, left eye, early dry stage: Secondary | ICD-10-CM

## 2020-11-22 DIAGNOSIS — H43813 Vitreous degeneration, bilateral: Secondary | ICD-10-CM

## 2020-12-27 ENCOUNTER — Other Ambulatory Visit (HOSPITAL_COMMUNITY): Payer: Self-pay

## 2020-12-27 DIAGNOSIS — I5022 Chronic systolic (congestive) heart failure: Secondary | ICD-10-CM

## 2020-12-27 MED ORDER — FUROSEMIDE 40 MG PO TABS: 40.0000 mg | ORAL_TABLET | ORAL | 0 refills | Status: DC

## 2020-12-29 DIAGNOSIS — E039 Hypothyroidism, unspecified: Secondary | ICD-10-CM | POA: Diagnosis not present

## 2020-12-29 DIAGNOSIS — E349 Endocrine disorder, unspecified: Secondary | ICD-10-CM | POA: Diagnosis not present

## 2020-12-29 DIAGNOSIS — Z794 Long term (current) use of insulin: Secondary | ICD-10-CM | POA: Diagnosis not present

## 2020-12-29 DIAGNOSIS — E1165 Type 2 diabetes mellitus with hyperglycemia: Secondary | ICD-10-CM | POA: Diagnosis not present

## 2021-01-16 ENCOUNTER — Other Ambulatory Visit: Payer: Self-pay | Admitting: *Deleted

## 2021-01-16 ENCOUNTER — Other Ambulatory Visit (HOSPITAL_COMMUNITY): Payer: Self-pay | Admitting: Internal Medicine

## 2021-01-16 ENCOUNTER — Other Ambulatory Visit: Payer: Self-pay | Admitting: Internal Medicine

## 2021-01-16 DIAGNOSIS — E785 Hyperlipidemia, unspecified: Secondary | ICD-10-CM

## 2021-01-16 DIAGNOSIS — F418 Other specified anxiety disorders: Secondary | ICD-10-CM

## 2021-01-16 MED ORDER — ENTRESTO 97-103 MG PO TABS: 1.0000 | ORAL_TABLET | Freq: Two times a day (BID) | ORAL | 3 refills | Status: DC

## 2021-01-23 ENCOUNTER — Other Ambulatory Visit: Payer: Self-pay | Admitting: Internal Medicine

## 2021-01-23 DIAGNOSIS — F418 Other specified anxiety disorders: Secondary | ICD-10-CM

## 2021-02-09 ENCOUNTER — Other Ambulatory Visit (HOSPITAL_COMMUNITY): Payer: Self-pay | Admitting: *Deleted

## 2021-02-09 MED ORDER — CARVEDILOL 12.5 MG PO TABS: ORAL_TABLET | ORAL | 3 refills | Status: DC

## 2021-04-03 DIAGNOSIS — Z794 Long term (current) use of insulin: Secondary | ICD-10-CM | POA: Diagnosis not present

## 2021-04-03 DIAGNOSIS — E349 Endocrine disorder, unspecified: Secondary | ICD-10-CM | POA: Diagnosis not present

## 2021-04-03 DIAGNOSIS — E1165 Type 2 diabetes mellitus with hyperglycemia: Secondary | ICD-10-CM | POA: Diagnosis not present

## 2021-04-03 DIAGNOSIS — E039 Hypothyroidism, unspecified: Secondary | ICD-10-CM | POA: Diagnosis not present

## 2021-04-03 LAB — HEMOGLOBIN A1C: Hemoglobin A1C: 8.5

## 2021-04-18 ENCOUNTER — Other Ambulatory Visit: Payer: Self-pay | Admitting: Internal Medicine

## 2021-04-18 ENCOUNTER — Ambulatory Visit (HOSPITAL_BASED_OUTPATIENT_CLINIC_OR_DEPARTMENT_OTHER)
Admission: RE | Admit: 2021-04-18 | Discharge: 2021-04-18 | Disposition: A | Discharge status: Home / Self Care | Payer: BC Managed Care – PPO | Source: Ambulatory Visit | Attending: Internal Medicine | Admitting: Internal Medicine

## 2021-04-18 ENCOUNTER — Ambulatory Visit (HOSPITAL_COMMUNITY)
Admission: RE | Admit: 2021-04-18 | Discharge: 2021-04-18 | Disposition: A | Discharge status: Home / Self Care | Payer: BC Managed Care – PPO | Source: Ambulatory Visit | Attending: Internal Medicine | Admitting: Internal Medicine

## 2021-04-18 VITALS — BP 125/81 | HR 78

## 2021-04-18 DIAGNOSIS — I420 Dilated cardiomyopathy: Secondary | ICD-10-CM | POA: Insufficient documentation

## 2021-04-18 DIAGNOSIS — Z8616 Personal history of COVID-19: Secondary | ICD-10-CM | POA: Diagnosis not present

## 2021-04-18 DIAGNOSIS — Z905 Acquired absence of kidney: Secondary | ICD-10-CM | POA: Diagnosis not present

## 2021-04-18 DIAGNOSIS — I110 Hypertensive heart disease with heart failure: Secondary | ICD-10-CM | POA: Insufficient documentation

## 2021-04-18 DIAGNOSIS — I5022 Chronic systolic (congestive) heart failure: Secondary | ICD-10-CM

## 2021-04-18 DIAGNOSIS — Z79899 Other long term (current) drug therapy: Secondary | ICD-10-CM | POA: Diagnosis not present

## 2021-04-18 DIAGNOSIS — F418 Other specified anxiety disorders: Secondary | ICD-10-CM

## 2021-04-18 DIAGNOSIS — E785 Hyperlipidemia, unspecified: Secondary | ICD-10-CM | POA: Diagnosis not present

## 2021-04-18 DIAGNOSIS — I10 Essential (primary) hypertension: Secondary | ICD-10-CM

## 2021-04-18 DIAGNOSIS — I2510 Atherosclerotic heart disease of native coronary artery without angina pectoris: Secondary | ICD-10-CM | POA: Insufficient documentation

## 2021-04-18 DIAGNOSIS — Z85528 Personal history of other malignant neoplasm of kidney: Secondary | ICD-10-CM | POA: Diagnosis not present

## 2021-04-18 DIAGNOSIS — I428 Other cardiomyopathies: Secondary | ICD-10-CM | POA: Diagnosis not present

## 2021-04-18 DIAGNOSIS — Z7982 Long term (current) use of aspirin: Secondary | ICD-10-CM | POA: Insufficient documentation

## 2021-04-18 DIAGNOSIS — E669 Obesity, unspecified: Secondary | ICD-10-CM | POA: Diagnosis not present

## 2021-04-18 DIAGNOSIS — Z7901 Long term (current) use of anticoagulants: Secondary | ICD-10-CM | POA: Diagnosis not present

## 2021-04-18 DIAGNOSIS — R918 Other nonspecific abnormal finding of lung field: Secondary | ICD-10-CM | POA: Insufficient documentation

## 2021-04-18 LAB — ECHOCARDIOGRAM COMPLETE
AR max vel: 2.45 cm2
AV Area VTI: 2.67 cm2
AV Area mean vel: 2.33 cm2
AV Mean grad: 3 mmHg
AV Peak grad: 5.4 mmHg
Ao pk vel: 1.16 m/s
Calc EF: 27.2 %
S' Lateral: 5.1 cm
Single Plane A2C EF: 23.1 %
Single Plane A4C EF: 31.5 %

## 2021-04-18 NOTE — Patient Instructions (Signed)
Your physician recommends that you schedule a follow-up appointment in: 6 months (May 2023) **Call in March for your appointment**.  If you have any questions or concerns before your next appointment please send [place] a message through [place] or call our office at [phone].    TO LEAVE A MESSAGE FOR THE NURSE SELECT OPTION 2, PLEASE LEAVE A MESSAGE INCLUDING: YOUR NAME DATE OF BIRTH CALL BACK NUMBER REASON FOR CALL**this is important as we prioritize the call backs  YOU WILL RECEIVE A CALL BACK THE SAME DAY AS LONG AS YOU CALL BEFORE 4:00 PM  At the [facility], you and your health needs are our priority. As part of our continuing mission to provide you with exceptional heart care, we have created designated Provider Care Teams. These Care Teams include your primary Cardiologist (physician) and Advanced Practice Providers (APPs- Physician Assistants and Nurse Practitioners) who all work together to provide you with the care you need, when you need it.   You may see any of the following providers on your designated Care Team at your next follow up: Dr Glori Bickers Dr Haynes Kerns, NP Lyda Jester, [place] [facility] [place], [place] Audry Riles, PharmD   Please be sure to bring in all your medications bottles to every appointment.

## 2021-04-18 NOTE — Progress Notes (Signed)
  Echocardiogram 2D Echocardiogram has been performed.  Merrie Roof F 04/18/2021, 12:06 PM

## 2021-04-18 NOTE — Progress Notes (Signed)
Advanced Heart Failure Clinic Note   Date:  04/18/2021   ID:  Gregory [name], DOB 12-31-1960, MRN [id number]  Location: Home  Provider location: [facility] Type of Visit: Established patient  PCP:  [name], MD  Cardiologist:  None Primary HF: Chord Takahashi  Chief Complaint: Heart Failure follow-up   History of Present Illness:  Gregory [name] is a 60 y/o male with h/o obesity, metastatic R renal cell CA s/p R nephrectomy in 9/17 with the subsequent discovery of 2 pulmonary nodules whom we follow for non-ischemic cardiomyopathy.   Developed microscopic hematuria and found to have large right renal mass c/w renal cell CA. Eventually underwent right radical nephrectomy with adrenal sparing on 02/07/16 at [facility]. Post-op developed bradycardia with HRs in 30s while sleeping. BP stable. When awakened had dizziness so given atropine. ECG revealed sinus brady in 40s with mild QT prolongation which resolved. Echo done and EF 45% with global HK. RV dilated  With normal function .   We saw him for the first time in October 2017. At that time we ordered cMRI and stress test. However in the interim he went back to [facility]. Found to have 2 metastatic lung nodules Echo showed EF at 46%. However MUGA with EF 28% so referred back to [place] for further evaluation prior to chemo. Underwent cath 04/09/16 as below. Had chemo and lung nodule resection.    1. 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 Otherwise normal coronaries 3. Nonischemic, dilated cardiomyopathy with EF 30-35% and global hypokinesis   EF 12/21 EF 30% RV normal  Echo 12/13/19 EF 20-25%. RV normal  cMRI 05/29/16 EF 28% No LGE cMRI 2/22 EF 20% RV 20% No LGE    Had sleep study which was negative.    Had COVID 03/19/19. Post-COVID seen at [facility] and had CT with ground glass changes felt to be post-COVID changes. Subsequently developed HF  symptoms. Had echo 07/29/19 EF 10-15% Mild RV dysfunction. Underwent aggressive med titration with help of PharmDs  He returns for HF Clinic f/u today. He is doing well. Mildly active. Walking 30 mins 1-2X/week. No CP or SOB. Denies, edema, orthopnea or PND. Compliant with meds. SBP 120-130   Echo today EF 25-30% RV ok Personally reviewed   Echos:     Echo 5/20 EF 35-40% ECHO 09/12/16 35-40%, RV ok. - Reviewed personally  Echo 05/22/17: EF 50-55%, grade 1 DD.     Past Medical History:  Diagnosis Date   Allergy    Anemia    low iron   Anxiety    Cancer (HCC)    Renal cell cancer   CHF (congestive heart failure) (HCC)    Complex tear of lateral meniscus of right knee as current injury 03/20/2017   Complex tear of medial meniscus of right knee 03/20/2017   Diabetes mellitus without complication ([place])    History of gastrointestinal hemorrhage    Hypertension    Hypertriglyceridemia    Macular degeneration    lasar surgery- Dr Zigmund Shawndell Schillaci   Neuropathy    Nonischemic cardiomyopathy ([place])    EF 25-30% by echo 09/12/16   Plantar warts    PONV (postoperative nausea and vomiting)    " a little bit of nausea"   Primary localized osteoarthritis of right knee 03/20/2017   Renal cell carcinoma ([place])    s/p nephrectomy 02/07/16 at [facility])  Past Surgical History:  Procedure Laterality Date   CARDIAC CATHETERIZATION N/A 04/19/2016   Procedure: Left Heart Cath and Coronary Angiography;  Surgeon: Jolaine Artist, MD;  Location: [place] [facility];  Service: Cardiovascular;  Laterality: N/A;   CHONDROPLASTY Right 03/20/2017   Procedure: CHONDROPLASTY;  Surgeon: Marchia Bond, MD;  Location: [place];  Service: Orthopedics;  Laterality: Right;   COLONOSCOPY  03/22/2005   EYE SURGERY Right    laser surgery, right eye macular degeneration   KNEE ARTHROSCOPY Right    x 2   KNEE ARTHROSCOPY WITH MEDIAL MENISECTOMY Right 03/20/2017   Procedure: KNEE ARTHROSCOPY WITH MEDIAL AND LATERAL  MENISECTOMY;  Surgeon: Marchia Bond, MD;  Location: [place];  Service: Orthopedics;  Laterality: Right;   NEPHRECTOMY Right    orif fx fibula     sebaceous cyst excision  1985   TONSILLECTOMY     TOTAL SHOULDER ARTHROPLASTY Left 11/11/2014   Procedure: LEFT TOTAL SHOULDER ARTHROPLASTY;  Surgeon: Netta Cedars, MD;  Location: [place];  Service: Orthopedics;  Laterality: Left;   VASECTOMY       Current Outpatient Medications  Medication Sig Dispense Refill   atorvastatin (LIPITOR) 40 MG tablet Take 40 mg by mouth daily.     blood glucose meter kit and supplies KIT Dispense based on patient and insurance preference. Use up to four times daily as directed. (FOR ICD-9 250.00, 250.01). 1 each 0   carvedilol (COREG) 12.5 MG tablet TAKE 1.5 TABLETS BY MOUTH TWICE DAILY 270 tablet 3   cetirizine (ZYRTEC) 10 MG tablet Take 10 mg by mouth daily.     Cholecalciferol 50 MCG (2000 UT) TABS Take 2 tablets (4,000 Units total) by mouth daily. 180 tablet 1   Continuous Blood Gluc Sensor (FREESTYLE LIBRE 14 DAY SENSOR) MISC U UTD     digoxin (LANOXIN) 0.125 MG tablet TAKE 1 TABLET(125 MCG) BY MOUTH DAILY 90 tablet 3   fluticasone (CUTIVATE) 0.005 % ointment Apply topically.     furosemide (LASIX) 40 MG tablet Take 1 tablet (40 mg total) by mouth 3 (three) times a week. Monday, Wednesday, Friday-may take additional as needed 48 tablet 0   Insulin Aspart, w/Niacinamide, (FIASP [place]) Inject 24 Units into the skin. W sliding scale as needed     levocetirizine (XYZAL) 5 MG tablet Take 5 mg by mouth every evening.     levothyroxine (SYNTHROID, LEVOTHROID) 25 MCG tablet TAKE 1 TABLET ONCE DAILY BEFORE BREAKFAST 30 tablet 0   PARoxetine (PAXIL) 30 MG tablet TAKE 1 TABLET(30 MG) BY MOUTH DAILY 90 tablet 0   sacubitril-valsartan (ENTRESTO) 97-103 MG Take 1 tablet by mouth 2 (two) times daily. 180 tablet 3   Semaglutide (OZEMPIC, 0.25 OR 0.5 MG/DOSE, [place]) Inject 2 mg into the skin once a week.     spironolactone (ALDACTONE)  25 MG tablet TAKE 1/2 TABLET(12.5 MG) BY MOUTH DAILY 45 tablet 3   thiamine 100 MG tablet Take 100 mg by mouth daily.     VUITY 1.25 % SOLN Instill 1 drop into both eyes once a day     XIIDRA 5 % SOLN 1 drop  twice a day     No current facility-administered medications for this encounter.    Allergies:   Patient has no known allergies.   Social History:  The patient  reports that he has never smoked. He has never used smokeless tobacco. He reports current alcohol use. He reports that he does not use drugs.   Family History:  The patient's family history includes Bipolar disorder in his mother; Cancer (age of onset: 68) in his mother; Hypertension in his mother; Other in his father.   ROS:  Please see the history of present illness.   All other systems are personally reviewed and negative.   Vitals:   04/18/21 1241  BP: 125/81  Pulse: 78  SpO2: 98%    Exam:   General:  Well appearing. No resp difficulty HEENT: normal Neck: supple. no JVD. Carotids 2+ bilat; no bruits. No lymphadenopathy or thryomegaly appreciated. Cor: PMI nondisplaced. Regular rate & rhythm. No rubs, gallops or murmurs. Lungs: clear Abdomen: obese soft, nontender, nondistended. No hepatosplenomegaly. No bruits or masses. Good bowel sounds. Extremities: no cyanosis, clubbing, rash, edema Neuro: alert & orientedx3, cranial nerves grossly intact. moves all 4 extremities w/o difficulty. Affect pleasant    Recent Labs: 06/22/2020: B Natriuretic Peptide 84.8; BUN 20; Creatinine, Ser 1.22; Potassium 4.9; Sodium 137  Personally reviewed   Wt Readings from Last 3 Encounters:  10/25/20 118.8 kg (262 lb)  06/22/20 123.5 kg (272 lb 3.2 oz)  05/17/20 124.5 kg (274 lb 6.4 oz)      ASSESSMENT AND PLAN:  1. Chronic systolic HF due to NICM   - Unclear etiology. EF 30-35% by cath with NICM. cMRI 1/18 with EF 28%. Etiology remains unclear. No evidence of scar of infiltrative process on MRI. Echo 4/18 EF 35-40%. Echo  04/2017 EF 50-55% - Echo 07/29/19 EF 10-15% Mild RV - Echo 12/13/19 EF 20-25%. RV normal.  - Echo 12/21 EF 30% Personally reviewed - cMRI 2/22 EF 20% RV 20% No LGE - Echo today 04/18/21 EF 25-30% RV ok   - Stable NYHA II  - Volume status looks good - Continue carvedilol to 18.75 bid - Continue Entresto 97/103 mg bid.  - Continue spiro 12.5 will not increase with K 4.9 - No SGLT2i with Type I DM - Remains uninterested in ICD - Encouraged him to be more active - Labs next week at [facility]   2. Metastatic Renal Cell CA with lung nodules - s/p nephrectomy.  - Follows at [facility]. Felt to be in readmission    3. HTN:  -  Blood pressure well controlled. Continue current regimen.   4. CAD - 1v in small RCA.  - No s/s angina - Continue low-dose ASA and statin   5. 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 - Followed by Dr. Buddy Duty   Signed, Glori Bickers, MD  04/18/2021 12:59 PM  Advanced [facility] Failure [place] [address] Heart and [place] [address] [phone] (office) [phone] (fax)

## 2021-04-25 ENCOUNTER — Ambulatory Visit (INDEPENDENT_AMBULATORY_CARE_PROVIDER_SITE_OTHER): Payer: BC Managed Care – PPO | Admitting: Internal Medicine

## 2021-04-25 ENCOUNTER — Encounter: Payer: Self-pay | Admitting: Internal Medicine

## 2021-04-25 ENCOUNTER — Other Ambulatory Visit: Payer: Self-pay

## 2021-04-25 VITALS — BP 128/86 | HR 77 | Temp 98.2°F | Resp 16 | Ht 74.0 in | Wt 264.0 lb

## 2021-04-25 DIAGNOSIS — E118 Type 2 diabetes mellitus with unspecified complications: Secondary | ICD-10-CM

## 2021-04-25 DIAGNOSIS — E1165 Type 2 diabetes mellitus with hyperglycemia: Secondary | ICD-10-CM | POA: Diagnosis not present

## 2021-04-25 DIAGNOSIS — E785 Hyperlipidemia, unspecified: Secondary | ICD-10-CM

## 2021-04-25 DIAGNOSIS — D500 Iron deficiency anemia secondary to blood loss (chronic): Secondary | ICD-10-CM

## 2021-04-25 DIAGNOSIS — I10 Essential (primary) hypertension: Secondary | ICD-10-CM | POA: Diagnosis not present

## 2021-04-25 DIAGNOSIS — E559 Vitamin D deficiency, unspecified: Secondary | ICD-10-CM

## 2021-04-25 DIAGNOSIS — G63 Polyneuropathy in diseases classified elsewhere: Secondary | ICD-10-CM | POA: Diagnosis not present

## 2021-04-25 DIAGNOSIS — D513 Other dietary vitamin B12 deficiency anemia: Secondary | ICD-10-CM | POA: Diagnosis not present

## 2021-04-25 DIAGNOSIS — Z23 Encounter for immunization: Secondary | ICD-10-CM

## 2021-04-25 DIAGNOSIS — Z0001 Encounter for general adult medical examination with abnormal findings: Secondary | ICD-10-CM

## 2021-04-25 DIAGNOSIS — F418 Other specified anxiety disorders: Secondary | ICD-10-CM

## 2021-04-25 DIAGNOSIS — E538 Deficiency of other specified B group vitamins: Secondary | ICD-10-CM | POA: Diagnosis not present

## 2021-04-25 DIAGNOSIS — E519 Thiamine deficiency, unspecified: Secondary | ICD-10-CM

## 2021-04-25 LAB — MICROALBUMIN / CREATININE URINE RATIO
Creatinine,U: 146.4 mg/dL
Microalb Creat Ratio: 1.6 mg/g (ref 0.0–30.0)
Microalb, Ur: 2.4 mg/dL — ABNORMAL HIGH (ref 0.0–1.9)

## 2021-04-25 LAB — HEPATIC FUNCTION PANEL
ALT: 16 U/L (ref 0–53)
AST: 18 U/L (ref 0–37)
Albumin: 4.3 g/dL (ref 3.5–5.2)
Alkaline Phosphatase: 73 U/L (ref 39–117)
Bilirubin, Direct: 0.1 mg/dL (ref 0.0–0.3)
Total Bilirubin: 0.7 mg/dL (ref 0.2–1.2)
Total Protein: 6.9 g/dL (ref 6.0–8.3)

## 2021-04-25 LAB — LIPID PANEL
Cholesterol: 174 mg/dL (ref 0–200)
HDL: 43.8 mg/dL (ref >39.00)
NonHDL: 130.22
Total CHOL/HDL Ratio: 4
Triglycerides: 305 mg/dL — ABNORMAL HIGH (ref 0.0–149.0)
VLDL: 61 mg/dL — ABNORMAL HIGH (ref 0.0–40.0)

## 2021-04-25 LAB — CBC WITH DIFFERENTIAL/PLATELET
Basophils Absolute: 0 10*3/uL (ref 0.0–0.1)
Basophils Relative: 0.8 % (ref 0.0–3.0)
Eosinophils Absolute: 0.2 10*3/uL (ref 0.0–0.7)
Eosinophils Relative: 4.1 % (ref 0.0–5.0)
HCT: 41.9 % (ref 39.0–52.0)
Hemoglobin: 14 g/dL (ref 13.0–17.0)
Lymphocytes Relative: 21.1 % (ref 12.0–46.0)
Lymphs Abs: 1.2 10*3/uL (ref 0.7–4.0)
MCHC: 33.3 g/dL (ref 30.0–36.0)
MCV: 87.6 fl (ref 78.0–100.0)
Monocytes Absolute: 0.4 10*3/uL (ref 0.1–1.0)
Monocytes Relative: 7.2 % (ref 3.0–12.0)
Neutro Abs: 3.8 10*3/uL (ref 1.4–7.7)
Neutrophils Relative %: 66.8 % (ref 43.0–77.0)
Platelets: 208 10*3/uL (ref 150.0–400.0)
RBC: 4.78 Mil/uL (ref 4.22–5.81)
RDW: 13.2 % (ref 11.5–15.5)
WBC: 5.7 10*3/uL (ref 4.0–10.5)

## 2021-04-25 LAB — BASIC METABOLIC PANEL
BUN: 16 mg/dL (ref 6–23)
CO2: 31 mEq/L (ref 19–32)
Calcium: 9.3 mg/dL (ref 8.4–10.5)
Chloride: 99 mEq/L (ref 96–112)
Creatinine, Ser: 1.11 mg/dL (ref 0.40–1.50)
GFR: 71.63 mL/min (ref >60.00)
Glucose, Bld: 246 mg/dL — ABNORMAL HIGH (ref 70–99)
Potassium: 4 mEq/L (ref 3.5–5.1)
Sodium: 137 mEq/L (ref 135–145)

## 2021-04-25 LAB — IBC + FERRITIN
Ferritin: 125.2 ng/mL (ref 22.0–322.0)
Iron: 93 ug/dL (ref 42–165)
Saturation Ratios: 28.5 % (ref 20.0–50.0)
TIBC: 326.2 ug/dL (ref 250.0–450.0)
Transferrin: 233 mg/dL (ref 212.0–360.0)

## 2021-04-25 LAB — LDL CHOLESTEROL, DIRECT: Direct LDL: 95 mg/dL

## 2021-04-25 LAB — TSH: TSH: 1.93 u[IU]/mL (ref 0.35–5.50)

## 2021-04-25 LAB — FOLATE: Folate: 15.1 ng/mL (ref >5.9)

## 2021-04-25 LAB — VITAMIN B12: Vitamin B-12: 1470 pg/mL — ABNORMAL HIGH (ref 211–911)

## 2021-04-25 LAB — VITAMIN D 25 HYDROXY (VIT D DEFICIENCY, FRACTURES): VITD: 38.73 ng/mL (ref 30.00–100.00)

## 2021-04-25 LAB — PSA: PSA: 1.32 ng/mL (ref 0.10–4.00)

## 2021-04-25 MED ORDER — PAROXETINE HCL 30 MG PO TABS: ORAL_TABLET | ORAL | 1 refills | Status: DC

## 2021-04-25 NOTE — Patient Instructions (Signed)

## 2021-04-25 NOTE — Progress Notes (Signed)
Subjective:  Patient ID: Gregory [name], male    DOB: [birth year]  Age: 60 y.o. MRN: [id number]  CC: Annual Exam, Anemia, Hyperlipidemia, Diabetes, Hypertension, and Congestive Heart Failure  This visit occurred during the SARS-CoV-2 public health emergency.  Safety protocols were in place, including screening questions prior to the visit, additional usage of staff PPE, and extensive cleaning of exam room while observing appropriate contact time as indicated for disinfecting solutions.    HPI DAMARKO STITELY presents for a CPX and f/up -  In the last month he has seen endo and cardiology.  He tells me his blood sugars have not been very well controlled.  He denies polys.  He is active and denies chest pain, shortness of breath, diaphoresis, dizziness, or lightheadedness.  His renal cell carcinoma is followed by [facility] in [place].  Outpatient Medications Prior to Visit  Medication Sig Dispense Refill   atorvastatin (LIPITOR) 40 MG tablet Take 40 mg by mouth daily.     carvedilol (COREG) 12.5 MG tablet TAKE 1.5 TABLETS BY MOUTH TWICE DAILY 270 tablet 3   cetirizine (ZYRTEC) 10 MG tablet Take 10 mg by mouth daily.     Cholecalciferol 50 MCG (2000 UT) TABS Take 2 tablets (4,000 Units total) by mouth daily. 180 tablet 1   Continuous Blood Gluc Sensor (FREESTYLE LIBRE 14 DAY SENSOR) MISC U UTD     digoxin (LANOXIN) 0.125 MG tablet TAKE 1 TABLET(125 MCG) BY MOUTH DAILY 90 tablet 3   fluticasone (CUTIVATE) 0.005 % ointment Apply topically.     furosemide (LASIX) 40 MG tablet Take 1 tablet (40 mg total) by mouth 3 (three) times a week. Monday, Wednesday, Friday-may take additional as needed 48 tablet 0   Insulin Aspart, w/Niacinamide, (FIASP [place]) Inject 24 Units into the skin. W sliding scale as needed     levocetirizine (XYZAL) 5 MG tablet Take 5 mg by mouth every evening.     levothyroxine (SYNTHROID, LEVOTHROID) 25 MCG tablet TAKE 1 TABLET ONCE DAILY BEFORE BREAKFAST 30 tablet 0    sacubitril-valsartan (ENTRESTO) 97-103 MG Take 1 tablet by mouth 2 (two) times daily. 180 tablet 3   Semaglutide (OZEMPIC, 0.25 OR 0.5 MG/DOSE, [place]) Inject 2 mg into the skin once a week.     spironolactone (ALDACTONE) 25 MG tablet TAKE 1/2 TABLET(12.5 MG) BY MOUTH DAILY 45 tablet 3   thiamine 100 MG tablet Take 100 mg by mouth daily.     VUITY 1.25 % SOLN Instill 1 drop into both eyes once a day     XIIDRA 5 % SOLN 1 drop  twice a day     blood glucose meter kit and supplies KIT Dispense based on patient and insurance preference. Use up to four times daily as directed. (FOR ICD-9 250.00, 250.01). 1 each 0   PARoxetine (PAXIL) 30 MG tablet TAKE 1 TABLET(30 MG) BY MOUTH DAILY 90 tablet 0   No facility-administered medications prior to visit.    ROS Review of Systems  Constitutional:  Negative for chills, diaphoresis, fatigue and fever.  HENT: Negative.    Eyes: Negative.   Respiratory:  Negative for cough, chest tightness, shortness of breath and wheezing.   Cardiovascular:  Negative for chest pain, palpitations and leg swelling.  Gastrointestinal:  Negative for abdominal pain, constipation, diarrhea, nausea and vomiting.  Endocrine: Negative.   Genitourinary:  Negative for difficulty urinating, dysuria, testicular pain and urgency.  Musculoskeletal:  Negative for arthralgias, back pain and myalgias.  Skin: Negative.  Neurological:  Negative for dizziness, weakness and light-headedness.  Hematological:  Negative for adenopathy. Does not bruise/bleed easily.  Psychiatric/Behavioral: Negative.     Objective:  BP 128/86 (BP Location: Right Arm, Patient Position: Sitting, Cuff Size: Large)   Pulse 77   Temp 98.2 F (36.8 C) (Oral)   Resp 16   Ht _0  (1.88 m)   Wt 264 lb (119.7 kg)   SpO2 98%   BMI 33.90 kg/m   BP Readings from Last 3 Encounters:  04/25/21 128/86  04/18/21 125/81  10/25/20 130/70    Wt Readings from Last 3 Encounters:  04/25/21 264 lb (119.7 kg)  10/25/20  262 lb (118.8 kg)  06/22/20 272 lb 3.2 oz (123.5 kg)    Physical Exam Vitals reviewed.  Constitutional:      Appearance: Normal appearance.  HENT:     Nose: Nose normal.     Mouth/Throat:     Mouth: Mucous membranes are moist.  Eyes:     General: No scleral icterus.    Conjunctiva/sclera: Conjunctivae normal.  Cardiovascular:     Rate and Rhythm: Normal rate and regular rhythm.     Heart sounds: No murmur heard. Pulmonary:     Effort: Pulmonary effort is normal.     Breath sounds: No stridor. No wheezing or rhonchi.  Abdominal:     General: Abdomen is flat.     Palpations: There is no mass.     Tenderness: There is no abdominal tenderness. There is no guarding.     Hernia: No hernia is present. There is no hernia in the left inguinal area or right inguinal area.  Genitourinary:    Pubic Area: No rash.      Penis: Normal and circumcised.      Testes: Normal.        Right: Mass not present.        Left: Mass not present.     Epididymis:     Right: Normal.     Left: Normal.     Prostate: Normal. Not enlarged, not tender and no nodules present.     Rectum: Normal. Guaiac result negative. No mass, tenderness, anal fissure, external hemorrhoid or internal hemorrhoid. Normal anal tone.  Musculoskeletal:        General: Normal range of motion.     Cervical back: Neck supple.     Right lower leg: No edema.     Left lower leg: No edema.  Lymphadenopathy:     Cervical: No cervical adenopathy.     Lower Body: No right inguinal adenopathy. No left inguinal adenopathy.  Skin:    General: Skin is warm and dry.     Findings: No lesion or rash.  Neurological:     General: No focal deficit present.     Mental Status: He is alert.  Psychiatric:        Mood and Affect: Mood normal.        Behavior: Behavior normal.    Lab Results  Component Value Date   WBC 5.7 04/25/2021   HGB 14.0 04/25/2021   HCT 41.9 04/25/2021   PLT 208.0 04/25/2021   GLUCOSE 246 (H) 04/25/2021   CHOL  174 04/25/2021   TRIG 305.0 (H) 04/25/2021   HDL 43.80 04/25/2021   LDLDIRECT 95.0 04/25/2021   LDLCALC 105 08/31/2019   ALT 16 04/25/2021   AST 18 04/25/2021   NA 137 04/25/2021   K 4.0 04/25/2021   CL 99 04/25/2021   CREATININE 1.11  04/25/2021   BUN 16 04/25/2021   CO2 31 04/25/2021   TSH 1.93 04/25/2021   PSA 1.32 04/25/2021   INR 0.9 10/27/2019   HGBA1C 8.5 04/03/2021   MICROALBUR 2.4 (H) 04/25/2021    ECHOCARDIOGRAM COMPLETE  Result Date: 04/18/2021    ECHOCARDIOGRAM REPORT   Patient Name:   DYAMI UMBACH [facility] Date of Exam: 04/18/2021 Medical Rec #:  [id number]     Height:       74.0 in Accession #:    [id number]    Weight:       262.0 lb Date of Birth:  [birth year]      BSA:          2.438 m Patient Age:    5 years      BP:           127/81 mmHg Patient Gender: M             HR:           79 bpm. Exam Location:  Outpatient Procedure: 2D Echo, 3D Echo, Cardiac Doppler and Color Doppler Indications:    CHF  History:        Patient has prior history of Echocardiogram examinations, most                 recent 05/15/2020. CHF, CAD; Risk Factors:Diabetes and                 Dyslipidemia. Cancer with chemo.  Sonographer:    Merrie Roof RDCS Referring Phys: [place]  1. Left ventricular ejection fraction, by estimation, is 25 to 30%. The left ventricle has severely decreased function. The left ventricle has no regional wall motion abnormalities. The left ventricular internal cavity size was moderately dilated. Left ventricular diastolic parameters are consistent with Grade I diastolic dysfunction (impaired relaxation).  2. Right ventricular systolic function is normal. The right ventricular size is normal. There is normal pulmonary artery systolic pressure.  3. Left atrial size was mildly dilated.  4. Right atrial size was mildly dilated.  5. The mitral valve is normal in structure. Trivial mitral valve regurgitation. No evidence of mitral stenosis.  6. The aortic valve is  normal in structure. There is mild calcification of the aortic valve. Aortic valve regurgitation is trivial. No aortic stenosis is present.  7. The inferior vena cava is normal in size with greater than 50% respiratory variability, suggesting right atrial pressure of 3 mmHg. FINDINGS  Left Ventricle: Left ventricular ejection fraction, by estimation, is 25 to 30%. The left ventricle has severely decreased function. The left ventricle has no regional wall motion abnormalities. The left ventricular internal cavity size was moderately dilated. There is no left ventricular hypertrophy. Left ventricular diastolic parameters are consistent with Grade I diastolic dysfunction (impaired relaxation). Right Ventricle: The right ventricular size is normal. No increase in right ventricular wall thickness. Right ventricular systolic function is normal. There is normal pulmonary artery systolic pressure. The tricuspid regurgitant velocity is 2.43 m/s, and  with an assumed right atrial pressure of 3 mmHg, the estimated right ventricular systolic pressure is 69.4 mmHg. Left Atrium: Left atrial size was mildly dilated. Right Atrium: Right atrial size was mildly dilated. Pericardium: There is no evidence of pericardial effusion. Mitral Valve: The mitral valve is normal in structure. Trivial mitral valve regurgitation. No evidence of mitral valve stenosis. Tricuspid Valve: The tricuspid valve is normal in structure. Tricuspid valve regurgitation is not demonstrated. No evidence of tricuspid  stenosis. Aortic Valve: The aortic valve is normal in structure. There is mild calcification of the aortic valve. Aortic valve regurgitation is trivial. No aortic stenosis is present. Aortic valve mean gradient measures 3.0 mmHg. Aortic valve peak gradient measures 5.4 mmHg. Aortic valve area, by VTI measures 2.67 cm. Pulmonic Valve: The pulmonic valve was normal in structure. Pulmonic valve regurgitation is trivial. No evidence of pulmonic  stenosis. Aorta: The aortic root is normal in size and structure. Venous: The inferior vena cava is normal in size with greater than 50% respiratory variability, suggesting right atrial pressure of 3 mmHg. IAS/Shunts: No atrial level shunt detected by color flow Doppler.  LEFT VENTRICLE PLAX 2D LVIDd:         6.40 cm LVIDs:         5.10 cm LV PW:         1.00 cm LV IVS:        1.00 cm LVOT diam:     2.30 cm      3D Volume EF: LV SV:         52           3D EF:        40 % LV SV Index:   21           LV EDV:       285 ml LVOT Area:     4.15 cm     LV ESV:       171 ml                             LV SV:        114 ml  LV Volumes (MOD) LV vol d, MOD A2C: 221.0 ml LV vol d, MOD A4C: 149.0 ml LV vol s, MOD A2C: 170.0 ml LV vol s, MOD A4C: 102.0 ml LV SV MOD A2C:     51.0 ml LV SV MOD A4C:     149.0 ml LV SV MOD BP:      49.3 ml RIGHT VENTRICLE RV Basal diam:  4.00 cm LEFT ATRIUM              Index        RIGHT ATRIUM           Index LA diam:        4.80 cm  1.97 cm/m   RA Area:     19.20 cm LA Vol (A2C):   114.0 ml 46.76 ml/m  RA Volume:   55.60 ml  22.81 ml/m LA Vol (A4C):   64.6 ml  26.50 ml/m LA Biplane Vol: 86.3 ml  35.40 ml/m  AORTIC VALVE AV Area (Vmax):    2.45 cm AV Area (Vmean):   2.33 cm AV Area (VTI):     2.67 cm AV Vmax:           116.00 cm/s AV Vmean:          85.900 cm/s AV VTI:            0.193 m AV Peak Grad:      5.4 mmHg AV Mean Grad:      3.0 mmHg LVOT Vmax:         68.50 cm/s LVOT Vmean:        48.100 cm/s LVOT VTI:          0.124 m LVOT/AV VTI ratio: 0.64  AORTA Ao Root diam: 3.20  cm Ao Asc diam:  3.40 cm TRICUSPID VALVE TR Peak grad:   23.6 mmHg TR Vmax:        243.00 cm/s  SHUNTS Systemic VTI:  0.12 m Systemic Diam: 2.30 cm Glori Bickers MD Electronically signed by Glori Bickers MD Signature Date/Time: 04/18/2021/1:06:57 PM    Final     Assessment & Plan:   Travus was seen today for annual exam, anemia, hyperlipidemia, diabetes, hypertension and congestive heart  failure.  Diagnoses and all orders for this visit:  Essential hypertension- His BP is well controlled. -     Basic metabolic panel; Future -     TSH; Future -     Hepatic function panel; Future -     Hepatic function panel -     TSH -     Basic metabolic panel  Poorly controlled type 2 diabetes mellitus with complication ([place])- Followed by ENDO. -     Basic metabolic panel; Future -     Microalbumin / creatinine urine ratio; Future -     HM Diabetes Foot Exam -     Microalbumin / creatinine urine ratio -     Basic metabolic panel  Vitamin D40 deficiency neuropathy ([place])- improved -     CBC with Differential/Platelet; Future -     Vitamin B12; Future -     Folate; Future -     Folate -     Vitamin B12 -     CBC with Differential/Platelet  Other dietary vitamin B12 deficiency anemia- His H/H, B12/ folate are normal -     CBC with Differential/Platelet; Future -     Vitamin B12; Future -     Folate; Future -     Folate -     Vitamin B12 -     CBC with Differential/Platelet  Iron deficiency anemia due to chronic blood loss- His H/H and iron are normal now. -     CBC with Differential/Platelet; Future -     IBC + Ferritin; Future -     IBC + Ferritin -     CBC with Differential/Platelet  Hyperlipidemia LDL goal <70- LDL goal achieved. Doing well on the statin  -     TSH; Future -     Hepatic function panel; Future -     Hepatic function panel -     TSH  Encounter for general adult medical examination with abnormal findings- Exam completed, labs reviewed, vaccines reviewed and updated, cancer screenings are up-to-date, patient education was given. -     Lipid panel; Future -     PSA; Future -     PSA -     Lipid panel  Depression with anxiety -     PARoxetine (PAXIL) 30 MG tablet; TAKE 1 TABLET(30 MG) BY MOUTH DAILY  Manifestations of thiamine deficiency -     CBC with Differential/Platelet; Future -     CBC with Differential/Platelet  Vitamin D deficiency  disease -     VITAMIN D 25 Hydroxy (Vit-D Deficiency, Fractures); Future -     VITAMIN D 25 Hydroxy (Vit-D Deficiency, Fractures)  Other orders -     Varicella-zoster vaccine IM (Shingrix) -     LDL cholesterol, direct  I have discontinued Cristal Deer. Wedel's blood glucose meter kit and supplies. I am also having him maintain his cetirizine, levothyroxine, FreeStyle Libre 14 Day Sensor, Cholecalciferol, thiamine, levocetirizine, Xiidra, Vuity, fluticasone, digoxin, spironolactone, (Semaglutide (OZEMPIC, 0.25 OR 0.5 MG/DOSE, [place])), (Insulin Aspart, w/Niacinamide, (  FIASP [place])), furosemide, Entresto, carvedilol, atorvastatin, and PARoxetine.  Meds ordered this encounter  Medications   PARoxetine (PAXIL) 30 MG tablet    Sig: TAKE 1 TABLET(30 MG) BY MOUTH DAILY    Dispense:  90 tablet    Refill:  1     Follow-up: Return in about 3 months (around 07/24/2021).  Scarlette Calico, MD

## 2021-04-29 ENCOUNTER — Other Ambulatory Visit (HOSPITAL_COMMUNITY): Payer: Self-pay | Admitting: Internal Medicine

## 2021-04-29 DIAGNOSIS — I5022 Chronic systolic (congestive) heart failure: Secondary | ICD-10-CM

## 2021-05-07 ENCOUNTER — Other Ambulatory Visit: Payer: Self-pay | Admitting: Internal Medicine

## 2021-05-08 DIAGNOSIS — M5451 Vertebrogenic low back pain: Secondary | ICD-10-CM | POA: Diagnosis not present

## 2021-05-08 DIAGNOSIS — M4726 Other spondylosis with radiculopathy, lumbar region: Secondary | ICD-10-CM | POA: Diagnosis not present

## 2021-05-09 DIAGNOSIS — Z79899 Other long term (current) drug therapy: Secondary | ICD-10-CM | POA: Diagnosis not present

## 2021-05-09 DIAGNOSIS — Z794 Long term (current) use of insulin: Secondary | ICD-10-CM | POA: Diagnosis not present

## 2021-05-09 DIAGNOSIS — C641 Malignant neoplasm of right kidney, except renal pelvis: Secondary | ICD-10-CM | POA: Diagnosis not present

## 2021-05-09 DIAGNOSIS — C649 Malignant neoplasm of unspecified kidney, except renal pelvis: Secondary | ICD-10-CM | POA: Diagnosis not present

## 2021-05-09 DIAGNOSIS — Z8589 Personal history of malignant neoplasm of other organs and systems: Secondary | ICD-10-CM | POA: Diagnosis not present

## 2021-05-09 DIAGNOSIS — Z79890 Hormone replacement therapy (postmenopausal): Secondary | ICD-10-CM | POA: Diagnosis not present

## 2021-05-09 DIAGNOSIS — C7802 Secondary malignant neoplasm of left lung: Secondary | ICD-10-CM | POA: Diagnosis not present

## 2021-05-09 DIAGNOSIS — Z85528 Personal history of other malignant neoplasm of kidney: Secondary | ICD-10-CM | POA: Diagnosis not present

## 2021-05-09 DIAGNOSIS — Z08 Encounter for follow-up examination after completed treatment for malignant neoplasm: Secondary | ICD-10-CM | POA: Diagnosis not present

## 2021-05-10 DIAGNOSIS — C7802 Secondary malignant neoplasm of left lung: Secondary | ICD-10-CM | POA: Diagnosis not present

## 2021-05-10 DIAGNOSIS — E039 Hypothyroidism, unspecified: Secondary | ICD-10-CM | POA: Diagnosis not present

## 2021-05-10 DIAGNOSIS — I10 Essential (primary) hypertension: Secondary | ICD-10-CM | POA: Diagnosis not present

## 2021-05-10 DIAGNOSIS — C641 Malignant neoplasm of right kidney, except renal pelvis: Secondary | ICD-10-CM | POA: Diagnosis not present

## 2021-05-14 DIAGNOSIS — H16223 Keratoconjunctivitis sicca, not specified as Sjogren's, bilateral: Secondary | ICD-10-CM | POA: Diagnosis not present

## 2021-05-14 DIAGNOSIS — H25043 Posterior subcapsular polar age-related cataract, bilateral: Secondary | ICD-10-CM | POA: Diagnosis not present

## 2021-05-14 DIAGNOSIS — H2513 Age-related nuclear cataract, bilateral: Secondary | ICD-10-CM | POA: Diagnosis not present

## 2021-05-14 DIAGNOSIS — H35711 Central serous chorioretinopathy, right eye: Secondary | ICD-10-CM | POA: Diagnosis not present

## 2021-05-14 DIAGNOSIS — H3530 Unspecified macular degeneration: Secondary | ICD-10-CM | POA: Diagnosis not present

## 2021-05-14 DIAGNOSIS — H18413 Arcus senilis, bilateral: Secondary | ICD-10-CM | POA: Diagnosis not present

## 2021-05-15 DIAGNOSIS — M4726 Other spondylosis with radiculopathy, lumbar region: Secondary | ICD-10-CM | POA: Diagnosis not present

## 2021-05-15 DIAGNOSIS — M5451 Vertebrogenic low back pain: Secondary | ICD-10-CM | POA: Diagnosis not present

## 2021-05-22 DIAGNOSIS — M5451 Vertebrogenic low back pain: Secondary | ICD-10-CM | POA: Diagnosis not present

## 2021-05-22 DIAGNOSIS — M4726 Other spondylosis with radiculopathy, lumbar region: Secondary | ICD-10-CM | POA: Diagnosis not present

## 2021-06-14 LAB — HM DIABETES EYE EXAM

## 2021-07-06 DIAGNOSIS — E349 Endocrine disorder, unspecified: Secondary | ICD-10-CM | POA: Diagnosis not present

## 2021-07-06 DIAGNOSIS — E1165 Type 2 diabetes mellitus with hyperglycemia: Secondary | ICD-10-CM | POA: Diagnosis not present

## 2021-07-06 DIAGNOSIS — E039 Hypothyroidism, unspecified: Secondary | ICD-10-CM | POA: Diagnosis not present

## 2021-07-06 DIAGNOSIS — Z794 Long term (current) use of insulin: Secondary | ICD-10-CM | POA: Diagnosis not present

## 2021-07-18 IMAGING — DX DG CHEST 2V
2 series · 2 of 2 positions shown · non-contrast
Comparison: None.

CLINICAL DATA: Orthopnea

EXAM:
CHEST - 2 VIEW

[chest pa]
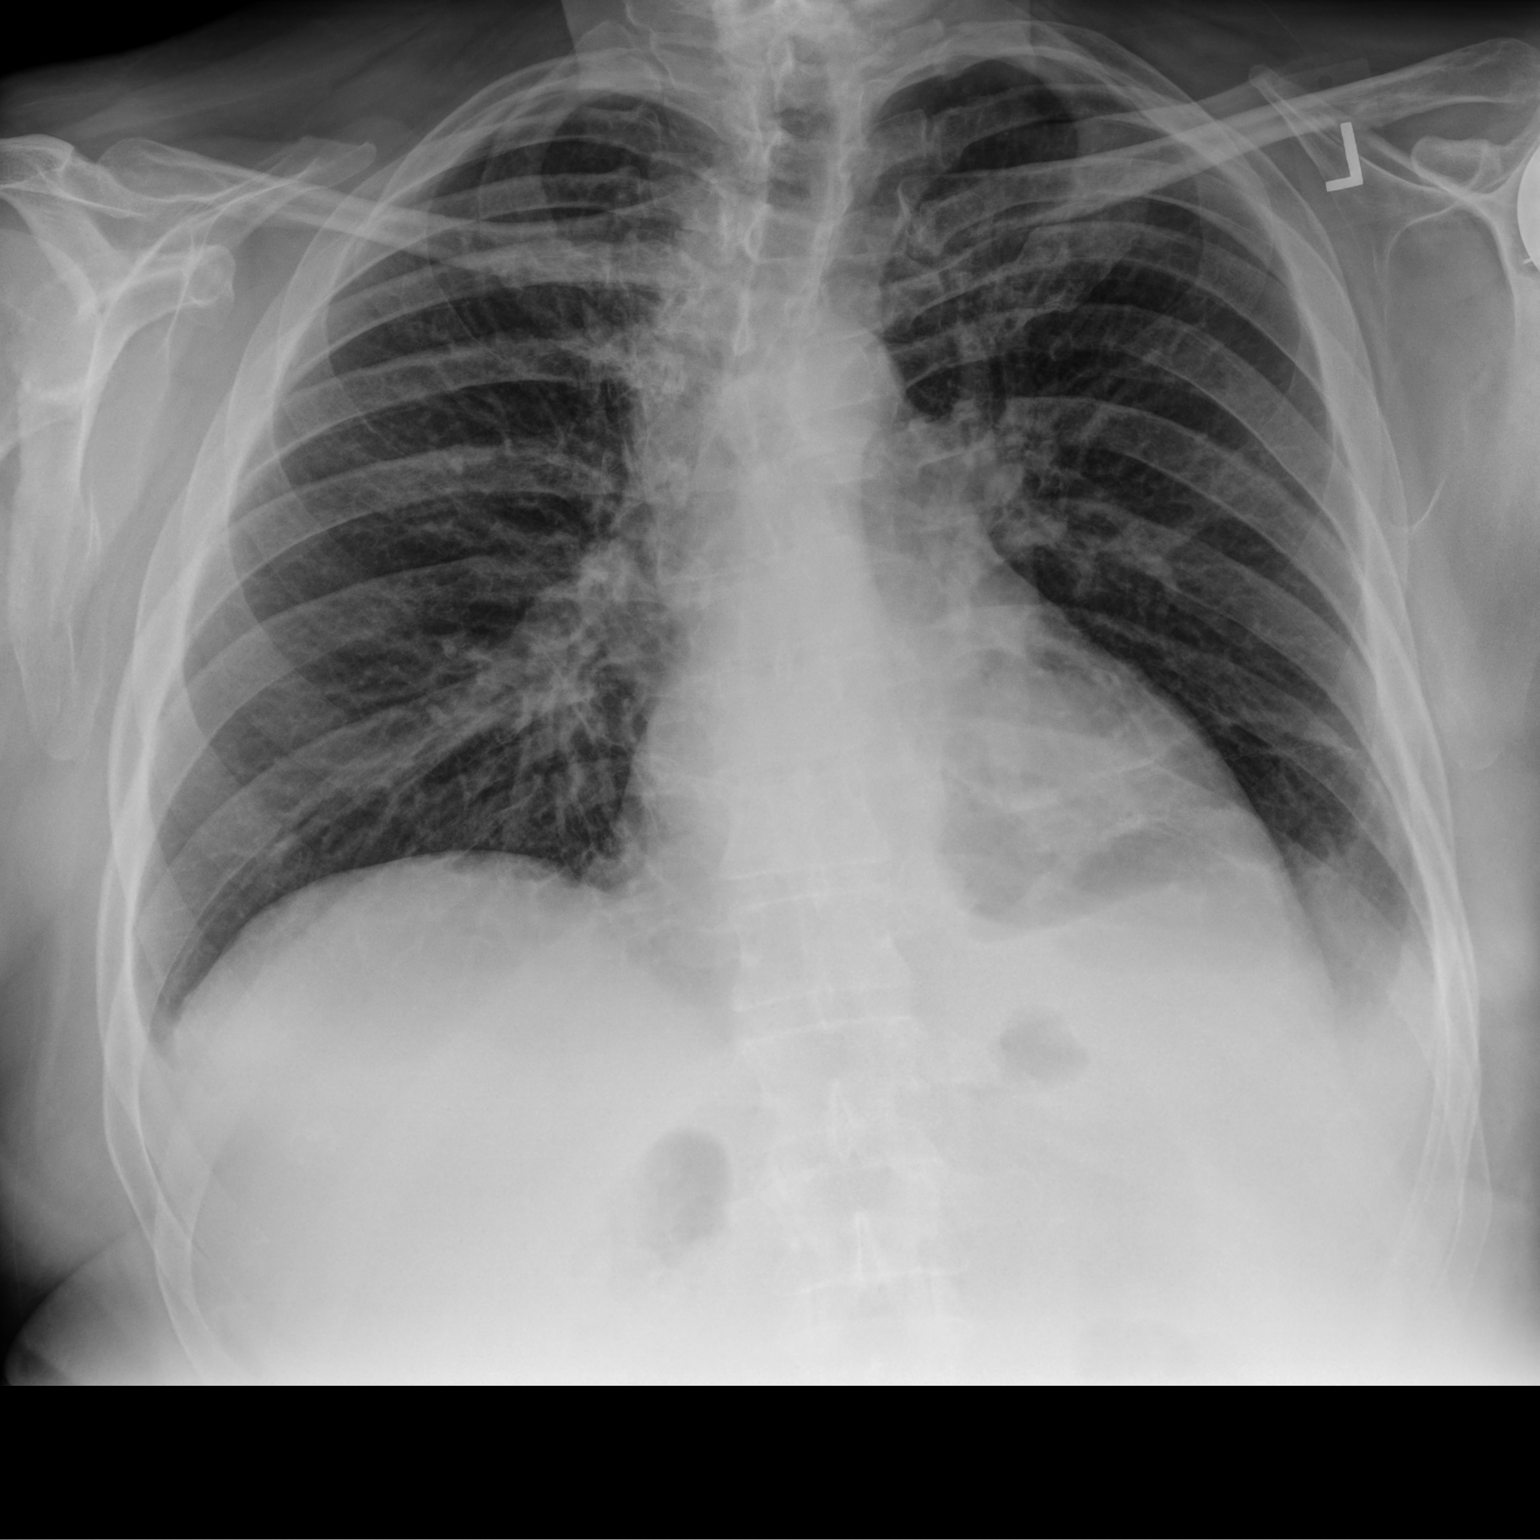

[chest lat]
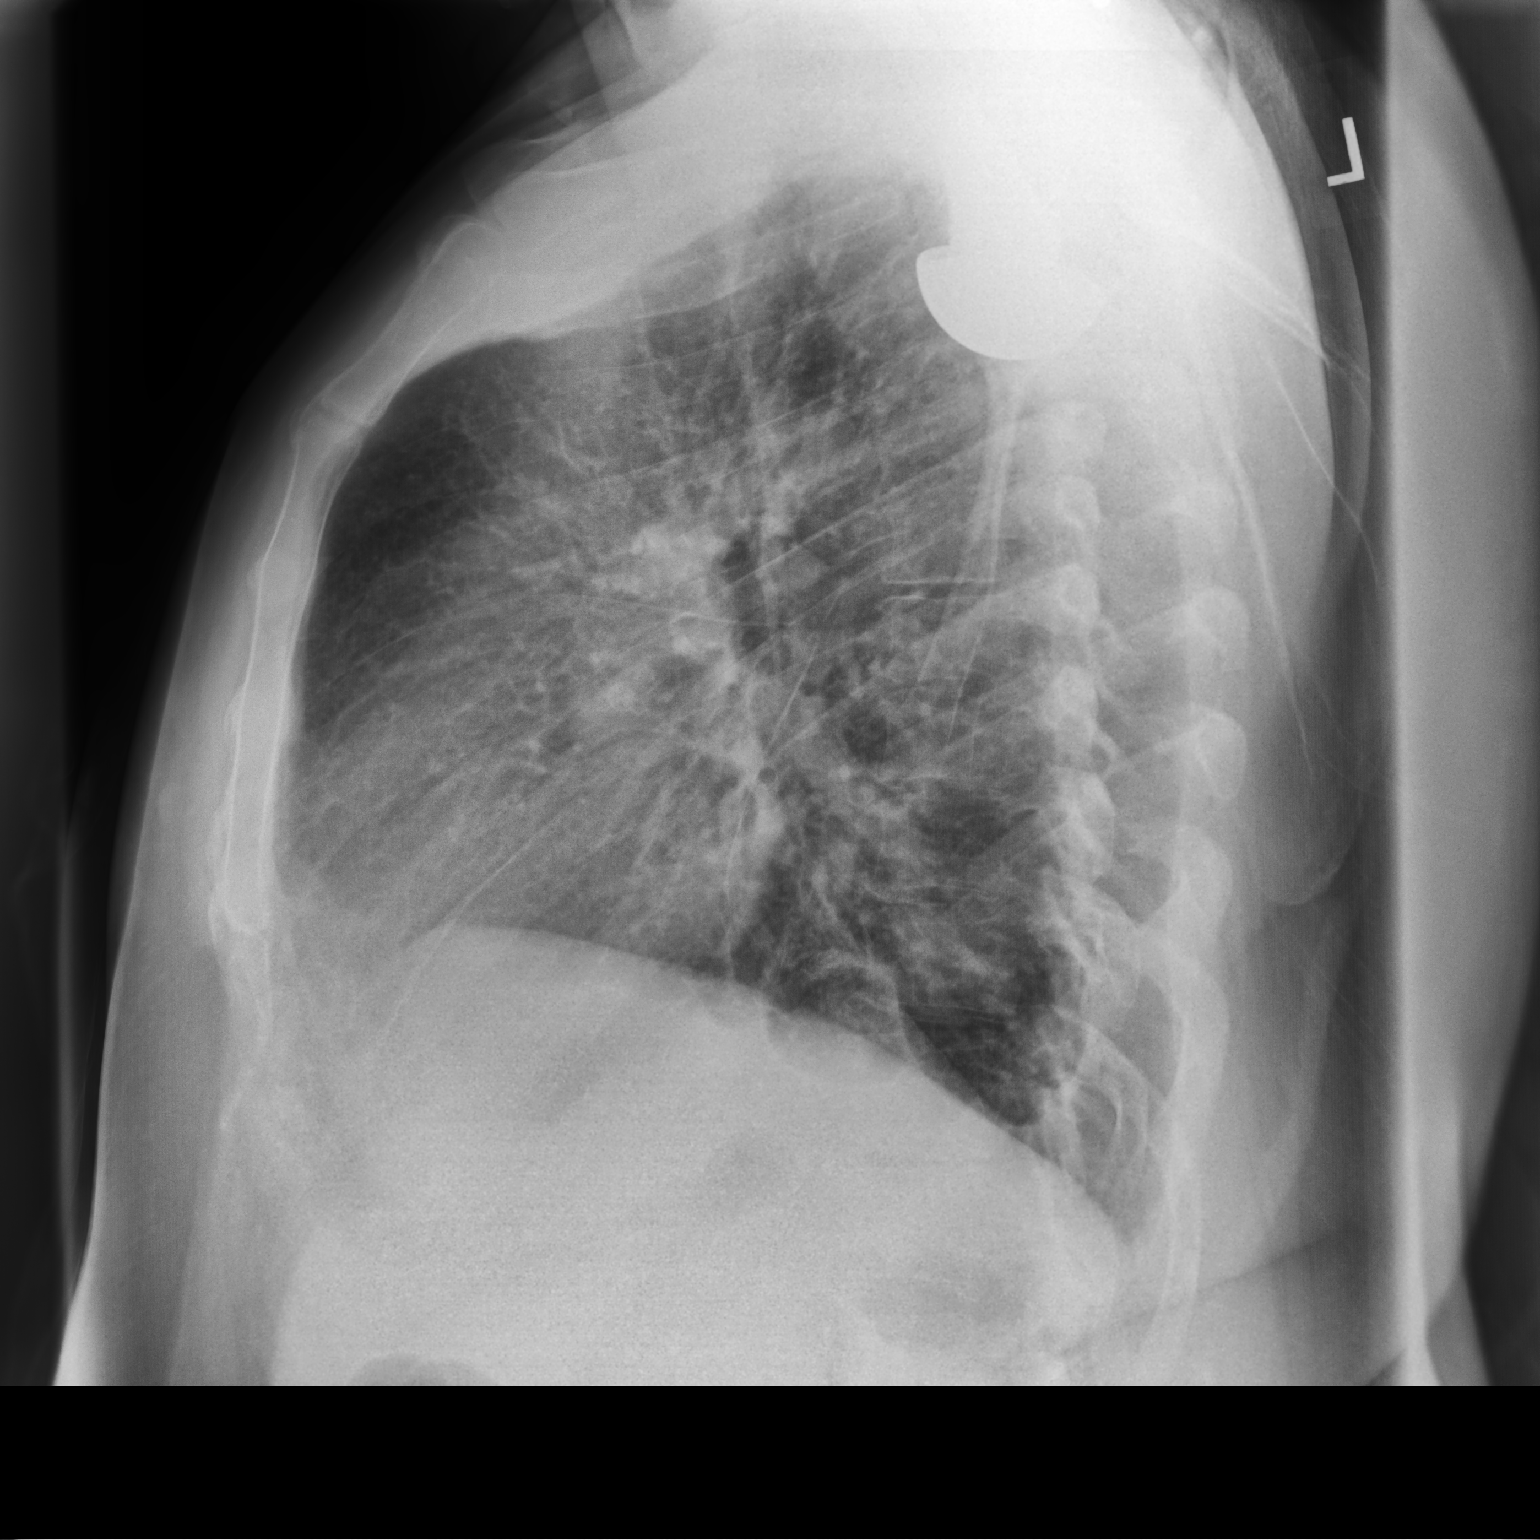

[2 of 2 positions shown; findings below may reference images not displayed]

FINDINGS: There is slight left base atelectasis. The lungs elsewhere are
clear. Heart size and pulmonary vascularity are normal. No
adenopathy. There is midthoracic dextroscoliosis. There is a total
shoulder replacement on the left.
IMPRESSION: Slight left base atelectasis. Lungs elsewhere clear. Cardiac
silhouette normal. No adenopathy.

## 2021-07-25 ENCOUNTER — Other Ambulatory Visit (HOSPITAL_COMMUNITY): Payer: Self-pay | Admitting: Internal Medicine

## 2021-09-12 ENCOUNTER — Other Ambulatory Visit (HOSPITAL_COMMUNITY): Payer: Self-pay

## 2021-09-12 DIAGNOSIS — I5022 Chronic systolic (congestive) heart failure: Secondary | ICD-10-CM

## 2021-09-12 MED ORDER — FUROSEMIDE 40 MG PO TABS: ORAL_TABLET | ORAL | 0 refills | Status: DC

## 2021-10-05 DIAGNOSIS — Z794 Long term (current) use of insulin: Secondary | ICD-10-CM | POA: Diagnosis not present

## 2021-10-05 DIAGNOSIS — E1165 Type 2 diabetes mellitus with hyperglycemia: Secondary | ICD-10-CM | POA: Diagnosis not present

## 2021-10-05 DIAGNOSIS — E039 Hypothyroidism, unspecified: Secondary | ICD-10-CM | POA: Diagnosis not present

## 2021-10-05 DIAGNOSIS — E349 Endocrine disorder, unspecified: Secondary | ICD-10-CM | POA: Diagnosis not present

## 2021-10-05 LAB — HEMOGLOBIN A1C: Hemoglobin A1C: 9.3

## 2021-10-06 ENCOUNTER — Other Ambulatory Visit: Payer: Self-pay | Admitting: Internal Medicine

## 2021-10-06 DIAGNOSIS — F418 Other specified anxiety disorders: Secondary | ICD-10-CM

## 2021-10-08 DIAGNOSIS — D485 Neoplasm of uncertain behavior of skin: Secondary | ICD-10-CM | POA: Diagnosis not present

## 2021-10-08 DIAGNOSIS — L814 Other melanin hyperpigmentation: Secondary | ICD-10-CM | POA: Diagnosis not present

## 2021-10-08 DIAGNOSIS — D225 Melanocytic nevi of trunk: Secondary | ICD-10-CM | POA: Diagnosis not present

## 2021-10-08 DIAGNOSIS — L570 Actinic keratosis: Secondary | ICD-10-CM | POA: Diagnosis not present

## 2021-10-08 DIAGNOSIS — L02221 Furuncle of abdominal wall: Secondary | ICD-10-CM | POA: Diagnosis not present

## 2021-10-08 DIAGNOSIS — L821 Other seborrheic keratosis: Secondary | ICD-10-CM | POA: Diagnosis not present

## 2021-10-29 ENCOUNTER — Ambulatory Visit: Payer: BC Managed Care – PPO | Admitting: Internal Medicine

## 2021-10-29 ENCOUNTER — Encounter: Payer: Self-pay | Admitting: Internal Medicine

## 2021-10-29 VITALS — BP 118/68 | HR 81 | Temp 98.4°F | Ht 74.0 in | Wt 267.0 lb

## 2021-10-29 DIAGNOSIS — D500 Iron deficiency anemia secondary to blood loss (chronic): Secondary | ICD-10-CM

## 2021-10-29 DIAGNOSIS — I10 Essential (primary) hypertension: Secondary | ICD-10-CM

## 2021-10-29 DIAGNOSIS — D513 Other dietary vitamin B12 deficiency anemia: Secondary | ICD-10-CM

## 2021-10-29 DIAGNOSIS — E538 Deficiency of other specified B group vitamins: Secondary | ICD-10-CM

## 2021-10-29 DIAGNOSIS — E519 Thiamine deficiency, unspecified: Secondary | ICD-10-CM

## 2021-10-29 DIAGNOSIS — G63 Polyneuropathy in diseases classified elsewhere: Secondary | ICD-10-CM

## 2021-10-29 LAB — CBC WITH DIFFERENTIAL/PLATELET
Basophils Absolute: 0 10*3/uL (ref 0.0–0.1)
Basophils Relative: 0.5 % (ref 0.0–3.0)
Eosinophils Absolute: 0.2 10*3/uL (ref 0.0–0.7)
Eosinophils Relative: 4.1 % (ref 0.0–5.0)
HCT: 37.5 % — ABNORMAL LOW (ref 39.0–52.0)
Hemoglobin: 12.7 g/dL — ABNORMAL LOW (ref 13.0–17.0)
Lymphocytes Relative: 18.4 % (ref 12.0–46.0)
Lymphs Abs: 1.1 10*3/uL (ref 0.7–4.0)
MCHC: 33.9 g/dL (ref 30.0–36.0)
MCV: 88.4 fl (ref 78.0–100.0)
Monocytes Absolute: 0.4 10*3/uL (ref 0.1–1.0)
Monocytes Relative: 6.4 % (ref 3.0–12.0)
Neutro Abs: 4.2 10*3/uL (ref 1.4–7.7)
Neutrophils Relative %: 70.6 % (ref 43.0–77.0)
Platelets: 178 10*3/uL (ref 150.0–400.0)
RBC: 4.25 Mil/uL (ref 4.22–5.81)
RDW: 13.1 % (ref 11.5–15.5)
WBC: 6 10*3/uL (ref 4.0–10.5)

## 2021-10-29 LAB — IBC + FERRITIN
Ferritin: 155.4 ng/mL (ref 22.0–322.0)
Iron: 87 ug/dL (ref 42–165)
Saturation Ratios: 28.9 % (ref 20.0–50.0)
TIBC: 301 ug/dL (ref 250.0–450.0)
Transferrin: 215 mg/dL (ref 212.0–360.0)

## 2021-10-29 LAB — BASIC METABOLIC PANEL
BUN: 20 mg/dL (ref 6–23)
CO2: 27 mEq/L (ref 19–32)
Calcium: 9.1 mg/dL (ref 8.4–10.5)
Chloride: 103 mEq/L (ref 96–112)
Creatinine, Ser: 1.11 mg/dL (ref 0.40–1.50)
GFR: 71.38 mL/min (ref >60.00)
Glucose, Bld: 254 mg/dL — ABNORMAL HIGH (ref 70–99)
Potassium: 4.2 mEq/L (ref 3.5–5.1)
Sodium: 138 mEq/L (ref 135–145)

## 2021-10-29 MED ORDER — CYANOCOBALAMIN 1000 MCG/ML IJ SOLN
1000.0000 ug | Freq: Once | INTRAMUSCULAR | Status: AC
  Administered 2021-10-29: 1000 ug via INTRAMUSCULAR

## 2021-10-29 NOTE — Patient Instructions (Signed)
Vitamin B12 Deficiency Vitamin B12 deficiency means that your body does not have enough vitamin B12. The body needs this important vitamin: To make red blood cells. To make genes (DNA). To help the nerves work. If you do not have enough vitamin B12 in your body, you can have health problems, such as not having enough red blood cells in the blood (anemia). What are the causes? Not eating enough foods that contain vitamin B12. Not being able to take in (absorb) vitamin B12 from the food that you eat. Certain diseases. A condition in which the body does not make enough of a certain protein. This results in your body not taking in enough vitamin B12. Having a surgery in which part of the stomach or small intestine is taken out. Taking medicines that make it hard for the body to take in vitamin B12. These include: Heartburn medicines. Some medicines that are used to treat diabetes. What increases the risk? Being an older adult. Eating a vegetarian or vegan diet that does not include any foods that come from animals. Not eating enough foods that contain vitamin B12 while you are pregnant. Taking certain medicines. Having alcoholism. What are the signs or symptoms? In some cases, there are no symptoms. If the condition leads to too few blood cells or nerve damage, symptoms can occur, such as: Feeling weak or tired. Not being hungry. Losing feeling (numbness) or tingling in your hands and feet. Redness and burning of the tongue. Feeling sad (depressed). Confusion or memory problems. Trouble walking. If anemia is very bad, symptoms can include: Being short of breath. Being dizzy. Having a very fast heartbeat. How is this treated? Changing the way you eat and drink, such as: Eating more foods that contain vitamin B12. Drinking little or no alcohol. Getting vitamin B12 shots. Taking vitamin B12 supplements by mouth (orally). Your doctor will tell you the dose that is best for you. Follow  these instructions at home: Eating and drinking  Eat foods that come from animals and have a lot of vitamin B12 in them. These include: Meats and poultry. This includes beef, pork, chicken, turkey, and organ meats, such as liver. Seafood, such as clams, rainbow trout, salmon, tuna, and haddock. Eggs. Dairy foods such as milk, yogurt, and cheese. Eat breakfast cereals that have vitamin B12 added to them (are fortified). Check the label. The items listed above may not be a complete list of foods and beverages you can eat and drink. Contact a dietitian for more information. Alcohol use Do not drink alcohol if: Your doctor tells you not to drink. You are pregnant, may be pregnant, or are planning to become pregnant. If you drink alcohol: Limit how much you have to: 0-1 drink a day for women. 0-2 drinks a day for men. Know how much alcohol is in your drink. In the [place] one drink equals one 12 oz bottle of beer (355 mL), one 5 oz glass of wine (148 mL), or one 1 oz glass of hard liquor (44 mL). General instructions Get any vitamin B12 shots if told by your doctor. Take supplements only as told by your doctor. Follow the directions. Keep all follow-up visits. Contact a doctor if: Your symptoms come back. Your symptoms get worse or do not get better with treatment. Get help right away if: You have trouble breathing. You have a very fast heartbeat. You have chest pain. You get dizzy. You faint. These symptoms may be an emergency. Get help right away. Call 911.   Do not wait to see if the symptoms will go away. Do not drive yourself to the hospital. Summary Vitamin B12 deficiency means that your body is not getting enough of the vitamin. In some cases, there are no symptoms of this condition. Treatment may include making a change in the way you eat and drink, getting shots, or taking supplements. Eat foods that have vitamin B12 in them. This information is not intended to replace advice  given to you by your health care provider. Make sure you discuss any questions you have with your health care provider. Document Revised: 01/05/2021 Document Reviewed: 01/05/2021 Elsevier Patient Education  2023 [employer]vier Inc.  

## 2021-10-29 NOTE — Progress Notes (Unsigned)
Subjective:  Patient ID: Gregory [name], male    DOB: 1960-08-28  Age: 61 y.o. MRN: [id number]  CC: Anemia, Hypertension, and Diabetes   HPI Gregory [name] presents for f/up -  He has not received a B12 injection in several months. He is active and denies DOE, CP, edema, diaphoresis, or fatigue.  Outpatient Medications Prior to Visit  Medication Sig Dispense Refill   atorvastatin (LIPITOR) 40 MG tablet Take 40 mg by mouth daily.     carvedilol (COREG) 12.5 MG tablet TAKE 1.5 TABLETS BY MOUTH TWICE DAILY 270 tablet 3   cetirizine (ZYRTEC) 10 MG tablet Take 10 mg by mouth daily.     Cholecalciferol 50 MCG (2000 UT) TABS Take 2 tablets (4,000 Units total) by mouth daily. 180 tablet 1   Continuous Blood Gluc Sensor (FREESTYLE LIBRE 14 DAY SENSOR) MISC U UTD     digoxin (LANOXIN) 0.125 MG tablet TAKE 1 TABLET(125 MCG) BY MOUTH DAILY 90 tablet 3   fluticasone (CUTIVATE) 0.005 % ointment Apply topically.     furosemide (LASIX) 40 MG tablet TAKE 1 TABLET( 40 MG TOTAL) BY MOUTH 3 TIMES A WEEK. MONDAY, WEDNESDAY, FRIDAY- MAY TAKE ADDITIONAL AS NEEDED 60 tablet 0   Insulin Aspart, w/Niacinamide, (FIASP [place]) Inject 24 Units into the skin. W sliding scale as needed     levocetirizine (XYZAL) 5 MG tablet Take 5 mg by mouth every evening.     levothyroxine (SYNTHROID, LEVOTHROID) 25 MCG tablet TAKE 1 TABLET ONCE DAILY BEFORE BREAKFAST 30 tablet 0   PARoxetine (PAXIL) 30 MG tablet TAKE 1 TABLET(30 MG) BY MOUTH DAILY 90 tablet 1   sacubitril-valsartan (ENTRESTO) 97-103 MG Take 1 tablet by mouth 2 (two) times daily. 180 tablet 3   Semaglutide (OZEMPIC, 0.25 OR 0.5 MG/DOSE, [place]) Inject 2 mg into the skin once a week.     spironolactone (ALDACTONE) 25 MG tablet TAKE 1/2 TABLET(12.5 MG) BY MOUTH DAILY 45 tablet 3   thiamine 100 MG tablet Take 100 mg by mouth daily.     VUITY 1.25 % SOLN Instill 1 drop into both eyes once a day     XIIDRA 5 % SOLN 1 drop  twice a day     No facility-administered medications  prior to visit.    ROS Review of Systems  Constitutional:  Positive for unexpected weight change (wt gain). Negative for chills, diaphoresis and fatigue.  HENT: Negative.    Respiratory: Negative.  Negative for cough, choking, shortness of breath and wheezing.   Cardiovascular:  Negative for chest pain, palpitations and leg swelling.  Gastrointestinal: Negative.  Negative for abdominal pain, constipation, diarrhea, nausea and vomiting.  Endocrine: Negative.   Genitourinary: Negative.  Negative for difficulty urinating and dysuria.  Musculoskeletal:  Positive for arthralgias. Negative for back pain and myalgias.  Skin: Negative.   Neurological:  Positive for numbness. Negative for dizziness, weakness and light-headedness.       ++ numbness in feet  Hematological:  Negative for adenopathy. Does not bruise/bleed easily.  Psychiatric/Behavioral: Negative.     Objective:  BP 118/68 (BP Location: Left Arm, Patient Position: Sitting, Cuff Size: Large)   Pulse 81   Temp 98.4 F (36.9 C) (Oral)   Ht '6\' 2"'$  (1.88 m)   Wt 267 lb (121.1 kg)   SpO2 94%   BMI 34.28 kg/m   BP Readings from Last 3 Encounters:  10/29/21 118/68  04/25/21 128/86  04/18/21 125/81    Wt Readings from Last 3 Encounters:  10/29/21 267 lb (121.1 kg)  04/25/21 264 lb (119.7 kg)  10/25/20 262 lb (118.8 kg)    Physical Exam Vitals reviewed.  Constitutional:      Appearance: He is ill-appearing.  HENT:     Nose: Nose normal.     Mouth/Throat:     Mouth: Mucous membranes are moist. Mucous membranes are pale.  Eyes:     General: No scleral icterus.    Conjunctiva/sclera: Conjunctivae normal.  Cardiovascular:     Rate and Rhythm: Normal rate and regular rhythm.     Heart sounds: No murmur heard. Pulmonary:     Effort: Pulmonary effort is normal.     Breath sounds: No stridor. No wheezing, rhonchi or rales.  Abdominal:     General: Abdomen is protuberant. There is no distension.     Palpations: There is  no mass.     Tenderness: There is no abdominal tenderness. There is no guarding.     Hernia: No hernia is present.  Musculoskeletal:        General: Normal range of motion.     Cervical back: Neck supple.     Right lower leg: No edema.     Left lower leg: No edema.  Lymphadenopathy:     Cervical: No cervical adenopathy.  Skin:    General: Skin is warm and dry.     Coloration: Skin is pale.  Neurological:     General: No focal deficit present.     Mental Status: He is alert. Mental status is at baseline.  Psychiatric:        Mood and Affect: Mood normal.        Behavior: Behavior normal.    Lab Results  Component Value Date   WBC 6.0 10/29/2021   HGB 12.7 (L) 10/29/2021   HCT 37.5 (L) 10/29/2021   PLT 178.0 10/29/2021   GLUCOSE 254 (H) 10/29/2021   CHOL 174 04/25/2021   TRIG 305.0 (H) 04/25/2021   HDL 43.80 04/25/2021   LDLDIRECT 95.0 04/25/2021   LDLCALC 105 08/31/2019   ALT 16 04/25/2021   AST 18 04/25/2021   NA 138 10/29/2021   K 4.2 10/29/2021   CL 103 10/29/2021   CREATININE 1.11 10/29/2021   BUN 20 10/29/2021   CO2 27 10/29/2021   TSH 1.93 04/25/2021   PSA 1.32 04/25/2021   INR 0.9 10/27/2019   HGBA1C 9.3 10/05/2021   MICROALBUR 2.4 (H) 04/25/2021    ECHOCARDIOGRAM COMPLETE  Result Date: 04/18/2021    ECHOCARDIOGRAM REPORT   Patient Name:   Gregory [name] [facility] Date of Exam: 04/18/2021 Medical Rec #:  [id number]     Height:       74.0 in Accession #:    [id number]    Weight:       262.0 lb Date of Birth:  19-Jul-1960      BSA:          2.438 m Patient Age:    10 years      BP:           127/81 mmHg Patient Gender: M             HR:           79 bpm. Exam Location:  Outpatient Procedure: 2D Echo, 3D Echo, Cardiac Doppler and Color Doppler Indications:    CHF  History:        Patient has prior history of Echocardiogram examinations, most  recent 05/15/2020. CHF, CAD; Risk Factors:Diabetes and                 Dyslipidemia. Cancer with chemo.  Sonographer:     Merrie Roof RDCS Referring Phys: [place]  1. Left ventricular ejection fraction, by estimation, is 25 to 30%. The left ventricle has severely decreased function. The left ventricle has no regional wall motion abnormalities. The left ventricular internal cavity size was moderately dilated. Left ventricular diastolic parameters are consistent with Grade I diastolic dysfunction (impaired relaxation).  2. Right ventricular systolic function is normal. The right ventricular size is normal. There is normal pulmonary artery systolic pressure.  3. Left atrial size was mildly dilated.  4. Right atrial size was mildly dilated.  5. The mitral valve is normal in structure. Trivial mitral valve regurgitation. No evidence of mitral stenosis.  6. The aortic valve is normal in structure. There is mild calcification of the aortic valve. Aortic valve regurgitation is trivial. No aortic stenosis is present.  7. The inferior vena cava is normal in size with greater than 50% respiratory variability, suggesting right atrial pressure of 3 mmHg. FINDINGS  Left Ventricle: Left ventricular ejection fraction, by estimation, is 25 to 30%. The left ventricle has severely decreased function. The left ventricle has no regional wall motion abnormalities. The left ventricular internal cavity size was moderately dilated. There is no left ventricular hypertrophy. Left ventricular diastolic parameters are consistent with Grade I diastolic dysfunction (impaired relaxation). Right Ventricle: The right ventricular size is normal. No increase in right ventricular wall thickness. Right ventricular systolic function is normal. There is normal pulmonary artery systolic pressure. The tricuspid regurgitant velocity is 2.43 m/s, and  with an assumed right atrial pressure of 3 mmHg, the estimated right ventricular systolic pressure is 69.6 mmHg. Left Atrium: Left atrial size was mildly dilated. Right Atrium: Right atrial size was  mildly dilated. Pericardium: There is no evidence of pericardial effusion. Mitral Valve: The mitral valve is normal in structure. Trivial mitral valve regurgitation. No evidence of mitral valve stenosis. Tricuspid Valve: The tricuspid valve is normal in structure. Tricuspid valve regurgitation is not demonstrated. No evidence of tricuspid stenosis. Aortic Valve: The aortic valve is normal in structure. There is mild calcification of the aortic valve. Aortic valve regurgitation is trivial. No aortic stenosis is present. Aortic valve mean gradient measures 3.0 mmHg. Aortic valve peak gradient measures 5.4 mmHg. Aortic valve area, by VTI measures 2.67 cm. Pulmonic Valve: The pulmonic valve was normal in structure. Pulmonic valve regurgitation is trivial. No evidence of pulmonic stenosis. Aorta: The aortic root is normal in size and structure. Venous: The inferior vena cava is normal in size with greater than 50% respiratory variability, suggesting right atrial pressure of 3 mmHg. IAS/Shunts: No atrial level shunt detected by color flow Doppler.  LEFT VENTRICLE PLAX 2D LVIDd:         6.40 cm LVIDs:         5.10 cm LV PW:         1.00 cm LV IVS:        1.00 cm LVOT diam:     2.30 cm      3D Volume EF: LV SV:         52           3D EF:        40 % LV SV Index:   21           LV  EDV:       285 ml LVOT Area:     4.15 cm     LV ESV:       171 ml                             LV SV:        114 ml  LV Volumes (MOD) LV vol d, MOD A2C: 221.0 ml LV vol d, MOD A4C: 149.0 ml LV vol s, MOD A2C: 170.0 ml LV vol s, MOD A4C: 102.0 ml LV SV MOD A2C:     51.0 ml LV SV MOD A4C:     149.0 ml LV SV MOD BP:      49.3 ml RIGHT VENTRICLE RV Basal diam:  4.00 cm LEFT ATRIUM              Index        RIGHT ATRIUM           Index LA diam:        4.80 cm  1.97 cm/m   RA Area:     19.20 cm LA Vol (A2C):   114.0 ml 46.76 ml/m  RA Volume:   55.60 ml  22.81 ml/m LA Vol (A4C):   64.6 ml  26.50 ml/m LA Biplane Vol: 86.3 ml  35.40 ml/m  AORTIC  VALVE AV Area (Vmax):    2.45 cm AV Area (Vmean):   2.33 cm AV Area (VTI):     2.67 cm AV Vmax:           116.00 cm/s AV Vmean:          85.900 cm/s AV VTI:            0.193 m AV Peak Grad:      5.4 mmHg AV Mean Grad:      3.0 mmHg LVOT Vmax:         68.50 cm/s LVOT Vmean:        48.100 cm/s LVOT VTI:          0.124 m LVOT/AV VTI ratio: 0.64  AORTA Ao Root diam: 3.20 cm Ao Asc diam:  3.40 cm TRICUSPID VALVE TR Peak grad:   23.6 mmHg TR Vmax:        243.00 cm/s  SHUNTS Systemic VTI:  0.12 m Systemic Diam: 2.30 cm Glori Bickers MD Electronically signed by Glori Bickers MD Signature Date/Time: 04/18/2021/1:06:57 PM    Final     Assessment & Plan:   Boby was seen today for anemia, hypertension and diabetes.  Diagnoses and all orders for this visit:  Essential hypertension -     Basic metabolic panel; Future -     Basic metabolic panel  Vitamin Z30 deficiency neuropathy (HCC) -     CBC with Differential/Platelet; Future -     cyanocobalamin ((VITAMIN B-12)) injection 1,000 mcg -     CBC with Differential/Platelet  Other dietary vitamin B12 deficiency anemia -     cyanocobalamin ((VITAMIN B-12)) injection 1,000 mcg  Manifestations of thiamine deficiency -     CBC with Differential/Platelet; Future -     CBC with Differential/Platelet  Iron deficiency anemia due to chronic blood loss -     CBC with Differential/Platelet; Future -     IBC + Ferritin; Future -     IBC + Ferritin -     CBC with Differential/Platelet   I am having Cristal [place]. Jolly  maintain his cetirizine, levothyroxine, FreeStyle Libre 14 Day Sensor, Cholecalciferol, thiamine, levocetirizine, Xiidra, Vuity, fluticasone, (Semaglutide (OZEMPIC, 0.25 OR 0.5 MG/DOSE, [place])), (Insulin Aspart, w/Niacinamide, (FIASP [place])), Entresto, carvedilol, atorvastatin, digoxin, spironolactone, furosemide, and PARoxetine. We administered cyanocobalamin.  Meds ordered this encounter  Medications   cyanocobalamin ((VITAMIN B-12)) injection  1,000 mcg     Follow-up: Return in about 6 months (around 04/30/2022).  Scarlette Calico, MD

## 2021-10-30 MED ORDER — THIAMINE HCL 100 MG PO TABS: 100.0000 mg | ORAL_TABLET | ORAL | 1 refills | Status: DC

## 2021-11-22 ENCOUNTER — Encounter (INDEPENDENT_AMBULATORY_CARE_PROVIDER_SITE_OTHER): Payer: BC Managed Care – PPO | Admitting: Ophthalmology

## 2021-12-04 ENCOUNTER — Encounter (INDEPENDENT_AMBULATORY_CARE_PROVIDER_SITE_OTHER): Payer: BC Managed Care – PPO | Admitting: Ophthalmology

## 2021-12-04 DIAGNOSIS — H353211 Exudative age-related macular degeneration, right eye, with active choroidal neovascularization: Secondary | ICD-10-CM | POA: Diagnosis not present

## 2021-12-04 DIAGNOSIS — H43813 Vitreous degeneration, bilateral: Secondary | ICD-10-CM | POA: Diagnosis not present

## 2021-12-04 DIAGNOSIS — H353121 Nonexudative age-related macular degeneration, left eye, early dry stage: Secondary | ICD-10-CM | POA: Diagnosis not present

## 2021-12-06 ENCOUNTER — Ambulatory Visit (HOSPITAL_COMMUNITY)
Admission: RE | Admit: 2021-12-06 | Discharge: 2021-12-06 | Disposition: A | Discharge status: Home / Self Care | Payer: BC Managed Care – PPO | Source: Ambulatory Visit | Attending: Internal Medicine | Admitting: Internal Medicine

## 2021-12-06 ENCOUNTER — Encounter (HOSPITAL_COMMUNITY): Payer: Self-pay | Admitting: Internal Medicine

## 2021-12-06 VITALS — BP 110/78 | HR 90 | Wt 272.0 lb

## 2021-12-06 DIAGNOSIS — E669 Obesity, unspecified: Secondary | ICD-10-CM | POA: Diagnosis not present

## 2021-12-06 DIAGNOSIS — Z85528 Personal history of other malignant neoplasm of kidney: Secondary | ICD-10-CM | POA: Insufficient documentation

## 2021-12-06 DIAGNOSIS — C7800 Secondary malignant neoplasm of unspecified lung: Secondary | ICD-10-CM | POA: Insufficient documentation

## 2021-12-06 DIAGNOSIS — I5022 Chronic systolic (congestive) heart failure: Secondary | ICD-10-CM | POA: Insufficient documentation

## 2021-12-06 DIAGNOSIS — Z794 Long term (current) use of insulin: Secondary | ICD-10-CM | POA: Insufficient documentation

## 2021-12-06 DIAGNOSIS — E785 Hyperlipidemia, unspecified: Secondary | ICD-10-CM

## 2021-12-06 DIAGNOSIS — I110 Hypertensive heart disease with heart failure: Secondary | ICD-10-CM | POA: Insufficient documentation

## 2021-12-06 DIAGNOSIS — I2510 Atherosclerotic heart disease of native coronary artery without angina pectoris: Secondary | ICD-10-CM | POA: Insufficient documentation

## 2021-12-06 DIAGNOSIS — E1040 Type 1 diabetes mellitus with diabetic neuropathy, unspecified: Secondary | ICD-10-CM | POA: Insufficient documentation

## 2021-12-06 DIAGNOSIS — Z79899 Other long term (current) drug therapy: Secondary | ICD-10-CM | POA: Insufficient documentation

## 2021-12-06 DIAGNOSIS — I420 Dilated cardiomyopathy: Secondary | ICD-10-CM | POA: Insufficient documentation

## 2021-12-06 DIAGNOSIS — Z905 Acquired absence of kidney: Secondary | ICD-10-CM | POA: Insufficient documentation

## 2021-12-06 DIAGNOSIS — C649 Malignant neoplasm of unspecified kidney, except renal pelvis: Secondary | ICD-10-CM | POA: Diagnosis not present

## 2021-12-06 MED ORDER — CARVEDILOL 25 MG PO TABS: 25.0000 mg | ORAL_TABLET | Freq: Two times a day (BID) | ORAL | 3 refills | Status: DC

## 2021-12-06 MED ORDER — ATORVASTATIN CALCIUM 80 MG PO TABS: 80.0000 mg | ORAL_TABLET | Freq: Every day | ORAL | 3 refills | Status: DC

## 2021-12-06 NOTE — Progress Notes (Signed)
Advanced Heart Failure Clinic Note   Date:  12/06/2021   ID:  Gregory [name], DOB [birth year], MRN [id number]  Location: Home  Provider location: [place] [facility] Type of Visit: Established patient  PCP:  Gregory Lima, MD  Cardiologist:  None Primary HF: Gregory [name]  Chief Complaint: Heart Failure follow-up   History of Present Illness:  Gregory [name] is a 61 y/o male with h/o obesity, metastatic R renal cell CA s/p R nephrectomy in 9/17 with the subsequent discovery of 2 pulmonary nodules whom we follow for non-ischemic cardiomyopathy.   Developed microscopic hematuria and found to have large right renal mass c/w renal cell CA. Eventually underwent right radical nephrectomy with adrenal sparing on 02/07/16 at [facility]. Post-op developed bradycardia with HRs in 30s while sleeping. BP stable. When awakened had dizziness so given atropine. ECG revealed sinus brady in 40s with mild QT prolongation which resolved. Echo done and EF 45% with global HK. RV dilated  With normal function .   We saw him for the first time in October 2017. At that time we ordered cMRI and stress test. However in the interim he went back to [facility] . Found to have 2 metastatic lung nodules Echo showed EF at 46%. However MUGA with EF 28% so referred back to [place] for further evaluation prior to chemo. Underwent cath 04/09/16 as below. Had chemo and lung nodule resection.    1. 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 Otherwise normal coronaries 3. Nonischemic, dilated cardiomyopathy with EF 30-35% and global hypokinesis   EF 12/21 EF 30% RV normal  Echo 12/13/19 EF 20-25%. RV normal  cMRI 05/29/16 EF 28% No LGE cMRI 2/22 EF 20% RV 20% No LGE    Had sleep study which was negative.    Echo 07/29/19 EF 10-15% Mild RV dysfunction. Underwent aggressive med titration with help of PharmDs  Echo 11/22 EF 25-30% RV ok   He returns  for HF Clinic f/u today. Says he is feeling good. Has put on some weight. Recently started [facility]. Denies CP, SOB, orthopnea, edema or PND. Compliant with meds. Working [employer]. Not exercising routinely but active. SBP 110-120  Echos:     Echo 5/20 EF 35-40% Echo 09/12/16 35-40%, RV ok. - Reviewed personally  Echo 05/22/17: EF 50-55%, grade 1 DD.     Past Medical History:  Diagnosis Date   Allergy    Anemia    low iron   Anxiety    Cancer (HCC)    Renal cell cancer   CHF (congestive heart failure) (HCC)    Complex tear of lateral meniscus of right knee as current injury 03/20/2017   Complex tear of medial meniscus of right knee 03/20/2017   Diabetes mellitus without complication ([place])    History of gastrointestinal hemorrhage    Hypertension    Hypertriglyceridemia    Macular degeneration    lasar surgery- Dr Zigmund Gregory [name]   Neuropathy    Nonischemic cardiomyopathy ([place])    EF 25-30% by echo 09/12/16   Plantar warts    PONV (postoperative nausea and vomiting)    " a little bit of nausea"   Primary localized osteoarthritis of right knee 03/20/2017   Renal cell carcinoma ([place])    s/p nephrectomy 02/07/16 at [facility])   Past Surgical History:  Procedure Laterality Date   CARDIAC CATHETERIZATION N/A 04/19/2016   Procedure: Left Heart Cath and  Coronary Angiography;  Surgeon: Jolaine Artist, MD;  Location: [place] [facility];  Service: Cardiovascular;  Laterality: N/A;   CHONDROPLASTY Right 03/20/2017   Procedure: CHONDROPLASTY;  Surgeon: Marchia Bond, MD;  Location: [place];  Service: Orthopedics;  Laterality: Right;   COLONOSCOPY  03/22/2005   EYE SURGERY Right    laser surgery, right eye macular degeneration   KNEE ARTHROSCOPY Right    x 2   KNEE ARTHROSCOPY WITH MEDIAL MENISECTOMY Right 03/20/2017   Procedure: KNEE ARTHROSCOPY WITH MEDIAL AND LATERAL MENISECTOMY;  Surgeon: Marchia Bond, MD;  Location: [place];  Service: Orthopedics;  Laterality: Right;   NEPHRECTOMY Right     orif fx fibula     sebaceous cyst excision  1985   TONSILLECTOMY     TOTAL SHOULDER ARTHROPLASTY Left 11/11/2014   Procedure: LEFT TOTAL SHOULDER ARTHROPLASTY;  Surgeon: Netta Cedars, MD;  Location: [place];  Service: Orthopedics;  Laterality: Left;   VASECTOMY       Current Outpatient Medications  Medication Sig Dispense Refill   atorvastatin (LIPITOR) 40 MG tablet Take 40 mg by mouth daily.     carvedilol (COREG) 12.5 MG tablet TAKE 1.5 TABLETS BY MOUTH TWICE DAILY 270 tablet 3   cetirizine (ZYRTEC) 10 MG tablet Take 10 mg by mouth daily.     Cholecalciferol 50 MCG (2000 UT) TABS Take 2 tablets (4,000 Units total) by mouth daily. 180 tablet 1   Continuous Blood Gluc Sensor (FREESTYLE LIBRE 14 DAY SENSOR) MISC U UTD     digoxin (LANOXIN) 0.125 MG tablet TAKE 1 TABLET(125 MCG) BY MOUTH DAILY 90 tablet 3   fluticasone (CUTIVATE) 0.005 % ointment Apply topically.     furosemide (LASIX) 40 MG tablet TAKE 1 TABLET( 40 MG TOTAL) BY MOUTH 3 TIMES A WEEK. MONDAY, WEDNESDAY, FRIDAY- MAY TAKE ADDITIONAL AS NEEDED 60 tablet 0   Insulin Aspart, w/Niacinamide, (FIASP [place]) Inject 24 Units into the skin. W sliding scale as needed     levocetirizine (XYZAL) 5 MG tablet Take 5 mg by mouth every evening.     levothyroxine (SYNTHROID, LEVOTHROID) 25 MCG tablet TAKE 1 TABLET ONCE DAILY BEFORE BREAKFAST 30 tablet 0   PARoxetine (PAXIL) 30 MG tablet TAKE 1 TABLET(30 MG) BY MOUTH DAILY 90 tablet 1   sacubitril-valsartan (ENTRESTO) 97-103 MG Take 1 tablet by mouth 2 (two) times daily. 180 tablet 3   spironolactone (ALDACTONE) 25 MG tablet TAKE 1/2 TABLET(12.5 MG) BY MOUTH DAILY 45 tablet 3   thiamine 100 MG tablet Take 1 tablet (100 mg total) by mouth every other day. 45 tablet 1   tirzepatide (MOUNJARO) 7.5 MG/0.5ML Pen Inject 7.5 mg into the skin once a week.     VUITY 1.25 % SOLN Instill 1 drop into both eyes once a day     No current facility-administered medications for this encounter.    Allergies:    Patient has no known allergies.   Social History:  The patient  reports that he has never smoked. He has never used smokeless tobacco. He reports current alcohol use. He reports that he does not use drugs.   Family History:  The patient's family history includes Bipolar disorder in his mother; Cancer (age of onset: 42) in his mother; Hypertension in his mother; Other in his father.   ROS:  Please see the history of present illness.   All other systems are personally reviewed and negative.   Vitals:   12/06/21 1353  BP: 110/78  Pulse: 90  SpO2: 95%  Weight: 123.4 kg (272 lb)   Wt Readings from Last 3 Encounters:  12/06/21 123.4 kg (272 lb)  10/29/21 121.1 kg (267 lb)  04/25/21 119.7 kg (264 lb)     Exam:   General:  Well appearing. No resp difficulty HEENT: normal Neck: supple. no JVD. Carotids 2+ bilat; no bruits. No lymphadenopathy or thryomegaly appreciated. Cor: PMI nondisplaced. Regular rate & rhythm. No rubs, gallops or murmurs. Lungs: clear Abdomen: obese soft, nontender, nondistended. No hepatosplenomegaly. No bruits or masses. Good bowel sounds. Extremities: no cyanosis, clubbing, rash, edema Neuro: alert & orientedx3, cranial nerves grossly intact. moves all 4 extremities w/o difficulty. Affect pleasant    Recent Labs: 04/25/2021: ALT 16; TSH 1.93 10/29/2021: BUN 20; Creatinine, Ser 1.11; Hemoglobin 12.7; Platelets 178.0; Potassium 4.2; Sodium 138  Personally reviewed   Wt Readings from Last 3 Encounters:  12/06/21 123.4 kg (272 lb)  10/29/21 121.1 kg (267 lb)  04/25/21 119.7 kg (264 lb)      ASSESSMENT AND PLAN:  1. Chronic systolic HF due to NICM   - Unclear etiology. EF 30-35% by cath with NICM. cMRI 1/18 with EF 28%. Etiology remains unclear. No evidence of scar of infiltrative process on MRI. Echo 4/18 EF 35-40%. Echo 04/2017 EF 50-55% - Echo 07/29/19 EF 10-15% Mild RV - Echo 12/13/19 EF 20-25%. RV normal.  - Echo 12/21 EF 30% Personally reviewed - cMRI  2/22 EF 20% RV 20% No LGE - Echo  04/18/21 EF 25-30% RV ok   - Stable NYHA II - Volume status looks good - Increase carvedilol to 25 bid - Continue Entresto 97/103 mg bid.  - Continue spiro 12.5 will not increase with previous hyperkalemia - No SGLT2i with Type I DM - Remains uninterested in ICD - Recent bloodwork 10/29/21 looks good  - Due for repeat echo    2. Metastatic Renal Cell CA with lung nodules - s/p nephrectomy.  - Follows at [facility]. Felt to be in readmission    3. HTN:  -  Blood pressure well controlled.   4. CAD - 1v in small RCA.  - No s/s angina - Continue low-dose ASA and statin - Recent LDL 105.  - Increase atorva to 80   5. 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 - Followed by Dr. Buddy Duty - Last A1c 9.3   Signed, Glori Bickers, MD  12/06/2021 2:16 PM  Advanced Heart Failure [address] [place] [address] [phone] (office) [phone] (fax)

## 2021-12-06 NOTE — Addendum Note (Signed)
Encounter addended by: Scarlette Calico, RN on: 12/06/2021 2:39 PM  Actions taken: Order list changed, Diagnosis association updated

## 2021-12-06 NOTE — Patient Instructions (Signed)
Increase Atorvastatin to 80 mg Daily  Increase Carvedilol to 25 mg Twice daily IN 2 WEEKS  Your physician recommends that you schedule a follow-up appointment in: 6 months with echocardiogram, **PLEASE CALL OUR OFFICE IN NOVEMBER TO SCHEDULE THIS APPOINTMENT  If you have any questions or concerns before your next appointment please send [place] a message through [place] or call our office at [phone].    TO LEAVE A MESSAGE FOR THE NURSE SELECT OPTION 2, PLEASE LEAVE A MESSAGE INCLUDING: YOUR NAME DATE OF BIRTH CALL BACK NUMBER REASON FOR CALL**this is important as we prioritize the call backs  YOU WILL RECEIVE A CALL BACK THE SAME DAY AS LONG AS YOU CALL BEFORE 4:00 PM

## 2021-12-06 NOTE — Addendum Note (Signed)
Encounter addended by: Scarlette Calico, RN on: 12/06/2021 2:32 PM  Actions taken: Order list changed, Clinical Note Signed

## 2022-01-04 DIAGNOSIS — E039 Hypothyroidism, unspecified: Secondary | ICD-10-CM | POA: Diagnosis not present

## 2022-01-04 DIAGNOSIS — E1165 Type 2 diabetes mellitus with hyperglycemia: Secondary | ICD-10-CM | POA: Diagnosis not present

## 2022-01-04 DIAGNOSIS — E349 Endocrine disorder, unspecified: Secondary | ICD-10-CM | POA: Diagnosis not present

## 2022-01-04 DIAGNOSIS — Z794 Long term (current) use of insulin: Secondary | ICD-10-CM | POA: Diagnosis not present

## 2022-02-05 DIAGNOSIS — E1165 Type 2 diabetes mellitus with hyperglycemia: Secondary | ICD-10-CM | POA: Diagnosis not present

## 2022-02-05 DIAGNOSIS — E039 Hypothyroidism, unspecified: Secondary | ICD-10-CM | POA: Diagnosis not present

## 2022-02-05 DIAGNOSIS — Z794 Long term (current) use of insulin: Secondary | ICD-10-CM | POA: Diagnosis not present

## 2022-02-05 DIAGNOSIS — E349 Endocrine disorder, unspecified: Secondary | ICD-10-CM | POA: Diagnosis not present

## 2022-03-15 ENCOUNTER — Other Ambulatory Visit: Payer: Self-pay | Admitting: Internal Medicine

## 2022-03-15 DIAGNOSIS — F418 Other specified anxiety disorders: Secondary | ICD-10-CM

## 2022-03-25 ENCOUNTER — Other Ambulatory Visit: Payer: Self-pay | Admitting: *Deleted

## 2022-03-25 DIAGNOSIS — I5022 Chronic systolic (congestive) heart failure: Secondary | ICD-10-CM

## 2022-03-25 MED ORDER — FUROSEMIDE 40 MG PO TABS: ORAL_TABLET | ORAL | 0 refills | Status: DC

## 2022-04-22 DIAGNOSIS — E1165 Type 2 diabetes mellitus with hyperglycemia: Secondary | ICD-10-CM | POA: Diagnosis not present

## 2022-04-22 DIAGNOSIS — E039 Hypothyroidism, unspecified: Secondary | ICD-10-CM | POA: Diagnosis not present

## 2022-04-22 DIAGNOSIS — Z794 Long term (current) use of insulin: Secondary | ICD-10-CM | POA: Diagnosis not present

## 2022-04-22 DIAGNOSIS — Z125 Encounter for screening for malignant neoplasm of prostate: Secondary | ICD-10-CM | POA: Diagnosis not present

## 2022-04-22 DIAGNOSIS — E349 Endocrine disorder, unspecified: Secondary | ICD-10-CM | POA: Diagnosis not present

## 2022-04-22 DIAGNOSIS — Z23 Encounter for immunization: Secondary | ICD-10-CM | POA: Diagnosis not present

## 2022-04-22 LAB — CBC AND DIFFERENTIAL
HCT: 41 (ref 41–53)
Hemoglobin: 13.9 (ref 13.5–17.5)
Platelets: 189 10*3/uL (ref 150–400)
WBC: 5.7

## 2022-04-22 LAB — MICROALBUMIN / CREATININE URINE RATIO: Microalb Creat Ratio: 3.28

## 2022-04-22 LAB — MICROALBUMIN, URINE: Microalb, Ur: 3.28

## 2022-04-22 LAB — TSH: TSH: 1.83 (ref 0.41–5.90)

## 2022-04-22 LAB — PROTEIN / CREATININE RATIO, URINE: Creatinine, Urine: 202

## 2022-04-24 ENCOUNTER — Telehealth (HOSPITAL_COMMUNITY): Payer: Self-pay | Admitting: *Deleted

## 2022-04-24 ENCOUNTER — Encounter: Payer: BC Managed Care – PPO | Attending: Internal Medicine | Admitting: Dietician

## 2022-04-24 ENCOUNTER — Other Ambulatory Visit: Payer: Self-pay | Admitting: Internal Medicine

## 2022-04-24 ENCOUNTER — Encounter: Payer: Self-pay | Admitting: Dietician

## 2022-04-24 VITALS — Ht 73.0 in | Wt 276.0 lb

## 2022-04-24 DIAGNOSIS — Z794 Long term (current) use of insulin: Secondary | ICD-10-CM | POA: Diagnosis not present

## 2022-04-24 DIAGNOSIS — E119 Type 2 diabetes mellitus without complications: Secondary | ICD-10-CM | POA: Diagnosis not present

## 2022-04-24 DIAGNOSIS — E519 Thiamine deficiency, unspecified: Secondary | ICD-10-CM

## 2022-04-24 NOTE — Progress Notes (Signed)
Diabetes Self-Management Education  Visit Type: First/Initial  Appt. Start Time: 7846 Appt. End Time: 1050  04/24/2022  [name], identified by name and date of birth, is a 61 y.o. male with a diagnosis of Diabetes: Type 2.   ASSESSMENT  Primary concern: Pt states he's been trying to dial in on his diet to lose weight. Pt states he started mounjaro for weight loss but wants to focus on nutrition too.   History includes: allergies, arthritis, anemia, anxiety, cancer, CHF, type 2 diabetes, HLD, HTN, kidney disease, sleep apnea.  Labs noted: 04/22/22 A1c 8.4% Medications include: insulin aspart and mounjaro Supplements: thiamine, vitamin D.  Patient is present today with his wife. Pt lives with wife who does most of the shopping and cooking.   Pt travels for 1-2 weeks out of the month for work. When he is on the road he eats starbucks and chickfila.   Pt carries an emergency box in the car with juice boxes and protein bars.  Pt has 1 kidney. Pt states he had cancer and had a kidney removed and dropped to 200 lbs, and is now up to 276 lbs and wants to be 220 lbs.   Pt states he sometimes has really busy days and will skip meals and go low. Sometimes he skips dinner when traveling, or he will skip lunch.   Pt states he does not drink much water. If he is traveling he will order a large water at [employer] and drink that throughout the day.   Pt has Freestyle Libre 3 CGM.   Pt aware of how he feels when he has hyperglycemia and hypoglycemia.   Height '6\' 1"'$  (1.854 m), weight 276 lb (125.2 kg). Body mass index is 36.41 kg/m.   Diabetes Self-Management Education - 04/24/22 0942       Visit Information   Visit Type First/Initial      Initial Visit   Diabetes Type Type 1    Date Diagnosed 2018    Are you currently following a meal plan? No    Are you taking your medications as prescribed? Yes      Health Coping   How would you rate your overall health? Good       Psychosocial Assessment   Patient Belief/Attitude about Diabetes Motivated to manage diabetes    What is the hardest part about your diabetes right now, causing you the most concern, or is the most worrisome to you about your diabetes?   Making healty food and beverage choices    Self-care barriers None    Self-management support Doctor's office;Family    Other persons present Patient;Spouse/SO    Patient Concerns Nutrition/Meal planning;Healthy Lifestyle    Special Needs None    Preferred Learning Style No preference indicated    Learning Readiness Ready    How often do you need to have someone help you when you read instructions, pamphlets, or other written materials from your doctor or pharmacy? 1 - Never    What is the last grade level you completed in school? bachelors      Pre-Education Assessment   Patient understands the diabetes disease and treatment process. Needs Instruction    Patient understands incorporating nutritional management into lifestyle. Needs Instruction    Patient undertands incorporating physical activity into lifestyle. Needs Instruction    Patient understands using medications safely. Needs Instruction    Patient understands monitoring blood glucose, interpreting and using results Needs Instruction    Patient understands prevention, detection,  and treatment of acute complications. Needs Instruction    Patient understands prevention, detection, and treatment of chronic complications. Needs Instruction    Patient understands how to develop strategies to address psychosocial issues. Needs Instruction    Patient understands how to develop strategies to promote health/change behavior. Needs Instruction      Complications   Last HgB A1C per patient/outside source 8.4 %    How often do you check your blood sugar? > 4 times/day    Fasting Blood glucose range (mg/dL) 70-129;130-179    Number of hypoglycemic episodes per month 1    Have you had a dilated eye exam in the  past 12 months? Yes    Have you had a dental exam in the past 12 months? Yes    Are you checking your feet? Yes    How many days per week are you checking your feet? 7      Dietary Intake   Breakfast starbucks egg white [place] bacon sandwich and donut or cake    Snack (morning) none    Lunch chickfila grilled sandwich and fries and milkshake    Snack (afternoon) 6:30: cheese and crackers    Dinner sub 6 in OR salad and soup OR    Snack (evening) none OR ice cream    Beverage(s) coffee with cream, 1 alcoholic drink rum and diet coke, milkshake,      Activity / Exercise   Activity / Exercise Type ADL's    How many days per week do you exercise? 0    How many minutes per day do you exercise? 0    Total minutes per week of exercise 0      Patient Education   Previous Diabetes Education No    Disease Pathophysiology Factors that contribute to the development of diabetes;Definition of diabetes, type 1 and 2, and the diagnosis of diabetes;Explored patient's options for treatment of their diabetes    Healthy Eating Role of diet in the treatment of diabetes and the relationship between the three main macronutrients and blood glucose level;Plate Method;Reviewed blood glucose goals for pre and post meals and how to evaluate the patients' food intake on their blood glucose level.;Meal timing in regards to the patients' current diabetes medication.;Effects of alcohol on blood glucose and safety factors with consumption of alcohol.;Meal options for control of blood glucose level and chronic complications.;Information on hints to eating out and maintain blood glucose control.    Being Active Role of exercise on diabetes management, blood pressure control and cardiac health.;Identified with patient nutritional and/or medication changes necessary with exercise.;Helped patient identify appropriate exercises in relation to his/her diabetes, diabetes complications and other health issue.    Medications  Taught/reviewed insulin/injectables, injection, site rotation, insulin/injectables storage and needle disposal.;Reviewed patients medication for diabetes, action, purpose, timing of dose and side effects.;Reviewed medication adjustment guidelines for hyperglycemia and sick days.    Monitoring Identified appropriate SMBG and/or A1C goals.;Daily foot exams;Yearly dilated eye exam;Taught/evaluated CGM (comment)    Acute complications Trained/discussed glucagon administration to patient and designated other.;Taught prevention, symptoms, and  treatment of hypoglycemia - the 15 rule.;Discussed and identified patients' prevention, symptoms, and treatment of hyperglycemia.;Educated on sick day management    Chronic complications Relationship between chronic complications and blood glucose control;Assessed and discussed foot care and prevention of foot problems;Lipid levels, blood glucose control and heart disease;Identified and discussed with patient  current chronic complications;Dental care;Nephropathy, what it is, prevention of, the use of ACE, ARB's and early detection of  through urine microalbumia.;Retinopathy and reason for yearly dilated eye exams;Reviewed with patient heart disease, higher risk of, and prevention    Diabetes Stress and Support Identified and addressed patients feelings and concerns about diabetes;Worked with patient to identify barriers to care and solutions;Role of stress on diabetes    Lifestyle and Health Coping Lifestyle issues that need to be addressed for better diabetes care      Individualized Goals (developed by patient)   Nutrition General guidelines for healthy choices and portions discussed    Physical Activity Exercise 3-5 times per week;30 minutes per day    Medications take my medication as prescribed    Monitoring  Consistenly use CGM    Problem Solving Eating Pattern    Reducing Risk examine blood glucose patterns;do foot checks daily;treat hypoglycemia with 15 grams of  carbs if blood glucose less than '70mg'$ /dL    Health Coping Ask for help with psychological, social, or emotional issues      Post-Education Assessment   Patient understands the diabetes disease and treatment process. Comprehends key points    Patient understands incorporating nutritional management into lifestyle. Comprehends key points    Patient undertands incorporating physical activity into lifestyle. Comprehends key points    Patient understands using medications safely. Demonstrates understanding / competency    Patient understands monitoring blood glucose, interpreting and using results Demonstrates understanding / competency    Patient understands prevention, detection, and treatment of acute complications. Comprehends key points    Patient understands prevention, detection, and treatment of chronic complications. Comprehends key points    Patient understands how to develop strategies to address psychosocial issues. Comprehends key points    Patient understands how to develop strategies to promote health/change behavior. Comprehends key points      Outcomes   Expected Outcomes Demonstrated interest in learning. Expect positive outcomes    Future DMSE 3-4 months    Program Status Not Completed             Individualized Plan for Diabetes Self-Management Training:   Learning Objective:  Patient will have a greater understanding of diabetes self-management. Patient education plan is to attend individual and/or group sessions per assessed needs and concerns.   Plan:   Patient Instructions  Aim for 150 minutes of physical activity weekly. Goal: Walk for 30 minutes 3 days a week.   Eat more Non-Starchy Vegetables and Fruits. Goal: aim to make 1/2 of your plate vegetables at least 2x/day  Minimize added sugars and refined grains.  Choose whole foods over processed.  Make simple meals at home more often than eating out.  Aim to eat within 1-2 hours of waking up and every  3-5 hours following.   Goal: aim to drink 1 24oz bottle by lunch and 1 more by the end of the day.   Tips for increasing water intake: -Keep a glass of water by your bedside so that you're encouraged to drink it upon waking -Carry a re-usable, re-fillable water bottle   Expected Outcomes:  Demonstrated interest in learning. Expect positive outcomes  Education material provided: ADA - How to Thrive: A Guide for Your Journey with Diabetes, My Plate, and Snack sheet  If problems or questions, patient to contact team via:  Phone  Future DSME appointment: 3-4 months

## 2022-04-24 NOTE — Patient Instructions (Addendum)
Aim for 150 minutes of physical activity weekly. Goal: Walk for 30 minutes 3 days a week.   Eat more Non-Starchy Vegetables and Fruits. Goal: aim to make 1/2 of your plate vegetables at least 2x/day  Minimize added sugars and refined grains.  Choose whole foods over processed.  Make simple meals at home more often than eating out.  Aim to eat within 1-2 hours of waking up and every 3-5 hours following.   Goal: aim to drink 1 24oz bottle by lunch and 1 more by the end of the day.   Tips for increasing water intake: -Keep a glass of water by your bedside so that you're encouraged to drink it upon waking -Carry a re-usable, re-fillable water bottle

## 2022-05-17 ENCOUNTER — Other Ambulatory Visit: Payer: Self-pay | Admitting: Internal Medicine

## 2022-05-22 ENCOUNTER — Ambulatory Visit (HOSPITAL_COMMUNITY): Payer: BC Managed Care – PPO

## 2022-05-22 ENCOUNTER — Encounter (HOSPITAL_COMMUNITY): Payer: BC Managed Care – PPO | Admitting: Internal Medicine

## 2022-06-03 DIAGNOSIS — C7802 Secondary malignant neoplasm of left lung: Secondary | ICD-10-CM | POA: Diagnosis not present

## 2022-06-03 DIAGNOSIS — C641 Malignant neoplasm of right kidney, except renal pelvis: Secondary | ICD-10-CM | POA: Diagnosis not present

## 2022-06-03 DIAGNOSIS — Z794 Long term (current) use of insulin: Secondary | ICD-10-CM | POA: Diagnosis not present

## 2022-06-03 DIAGNOSIS — Z79890 Hormone replacement therapy (postmenopausal): Secondary | ICD-10-CM | POA: Diagnosis not present

## 2022-06-03 DIAGNOSIS — Z79899 Other long term (current) drug therapy: Secondary | ICD-10-CM | POA: Diagnosis not present

## 2022-06-03 DIAGNOSIS — Z08 Encounter for follow-up examination after completed treatment for malignant neoplasm: Secondary | ICD-10-CM | POA: Diagnosis not present

## 2022-06-03 DIAGNOSIS — Z85528 Personal history of other malignant neoplasm of kidney: Secondary | ICD-10-CM | POA: Diagnosis not present

## 2022-06-04 DIAGNOSIS — C641 Malignant neoplasm of right kidney, except renal pelvis: Secondary | ICD-10-CM | POA: Diagnosis not present

## 2022-06-04 DIAGNOSIS — C7802 Secondary malignant neoplasm of left lung: Secondary | ICD-10-CM | POA: Diagnosis not present

## 2022-06-05 DIAGNOSIS — H3530 Unspecified macular degeneration: Secondary | ICD-10-CM | POA: Diagnosis not present

## 2022-06-05 DIAGNOSIS — H2513 Age-related nuclear cataract, bilateral: Secondary | ICD-10-CM | POA: Diagnosis not present

## 2022-06-05 DIAGNOSIS — H18413 Arcus senilis, bilateral: Secondary | ICD-10-CM | POA: Diagnosis not present

## 2022-06-05 DIAGNOSIS — H25043 Posterior subcapsular polar age-related cataract, bilateral: Secondary | ICD-10-CM | POA: Diagnosis not present

## 2022-06-05 DIAGNOSIS — H35711 Central serous chorioretinopathy, right eye: Secondary | ICD-10-CM | POA: Diagnosis not present

## 2022-06-11 LAB — HM DIABETES EYE EXAM

## 2022-07-08 ENCOUNTER — Telehealth (HOSPITAL_COMMUNITY): Payer: Self-pay | Admitting: *Deleted

## 2022-07-08 NOTE — Telephone Encounter (Signed)
Order ID: [id number]       Authorized  Approval Valid Through: 07/08/2022 - 08/06/2022  Echo authorization

## 2022-07-10 ENCOUNTER — Ambulatory Visit (HOSPITAL_BASED_OUTPATIENT_CLINIC_OR_DEPARTMENT_OTHER)
Admission: RE | Admit: 2022-07-10 | Discharge: 2022-07-10 | Disposition: A | Discharge status: Home / Self Care | Payer: BC Managed Care – PPO | Source: Ambulatory Visit | Attending: Internal Medicine | Admitting: Internal Medicine

## 2022-07-10 ENCOUNTER — Ambulatory Visit (HOSPITAL_COMMUNITY)
Admission: RE | Admit: 2022-07-10 | Discharge: 2022-07-10 | Disposition: A | Discharge status: Home / Self Care | Payer: BC Managed Care – PPO | Source: Ambulatory Visit | Attending: Internal Medicine | Admitting: Internal Medicine

## 2022-07-10 ENCOUNTER — Encounter (HOSPITAL_COMMUNITY): Payer: Self-pay | Admitting: Internal Medicine

## 2022-07-10 VITALS — BP 112/70 | HR 80 | Wt 273.6 lb

## 2022-07-10 DIAGNOSIS — Z7985 Long-term (current) use of injectable non-insulin antidiabetic drugs: Secondary | ICD-10-CM | POA: Insufficient documentation

## 2022-07-10 DIAGNOSIS — I2510 Atherosclerotic heart disease of native coronary artery without angina pectoris: Secondary | ICD-10-CM | POA: Diagnosis not present

## 2022-07-10 DIAGNOSIS — Z85528 Personal history of other malignant neoplasm of kidney: Secondary | ICD-10-CM | POA: Insufficient documentation

## 2022-07-10 DIAGNOSIS — E669 Obesity, unspecified: Secondary | ICD-10-CM | POA: Insufficient documentation

## 2022-07-10 DIAGNOSIS — Z7982 Long term (current) use of aspirin: Secondary | ICD-10-CM | POA: Diagnosis not present

## 2022-07-10 DIAGNOSIS — Z794 Long term (current) use of insulin: Secondary | ICD-10-CM | POA: Diagnosis not present

## 2022-07-10 DIAGNOSIS — E109 Type 1 diabetes mellitus without complications: Secondary | ICD-10-CM | POA: Insufficient documentation

## 2022-07-10 DIAGNOSIS — C7800 Secondary malignant neoplasm of unspecified lung: Secondary | ICD-10-CM | POA: Insufficient documentation

## 2022-07-10 DIAGNOSIS — I420 Dilated cardiomyopathy: Secondary | ICD-10-CM | POA: Diagnosis not present

## 2022-07-10 DIAGNOSIS — Z6836 Body mass index (BMI) 36.0-36.9, adult: Secondary | ICD-10-CM | POA: Diagnosis not present

## 2022-07-10 DIAGNOSIS — I5022 Chronic systolic (congestive) heart failure: Secondary | ICD-10-CM | POA: Diagnosis not present

## 2022-07-10 DIAGNOSIS — I110 Hypertensive heart disease with heart failure: Secondary | ICD-10-CM | POA: Insufficient documentation

## 2022-07-10 DIAGNOSIS — Z905 Acquired absence of kidney: Secondary | ICD-10-CM | POA: Insufficient documentation

## 2022-07-10 DIAGNOSIS — Z9221 Personal history of antineoplastic chemotherapy: Secondary | ICD-10-CM | POA: Diagnosis not present

## 2022-07-10 DIAGNOSIS — Z79899 Other long term (current) drug therapy: Secondary | ICD-10-CM | POA: Diagnosis not present

## 2022-07-10 DIAGNOSIS — Z8249 Family history of ischemic heart disease and other diseases of the circulatory system: Secondary | ICD-10-CM | POA: Diagnosis not present

## 2022-07-10 LAB — BASIC METABOLIC PANEL
Anion gap: 6 (ref 5–15)
BUN: 19 mg/dL (ref 8–23)
CO2: 29 mmol/L (ref 22–32)
Calcium: 9 mg/dL (ref 8.9–10.3)
Chloride: 101 mmol/L (ref 98–111)
Creatinine, Ser: 1.08 mg/dL (ref 0.61–1.24)
GFR, Estimated: >60 mL/min (ref >60)
Glucose, Bld: 185 mg/dL — ABNORMAL HIGH (ref 70–99)
Potassium: 4.2 mmol/L (ref 3.5–5.1)
Sodium: 136 mmol/L (ref 135–145)

## 2022-07-10 LAB — ECHOCARDIOGRAM COMPLETE
Calc EF: 22.7 %
S' Lateral: 5.7 cm
Single Plane A2C EF: 17.1 %
Single Plane A4C EF: 24.9 %

## 2022-07-10 LAB — BRAIN NATRIURETIC PEPTIDE: B Natriuretic Peptide: 261.6 pg/mL — ABNORMAL HIGH (ref 0.0–100.0)

## 2022-07-10 NOTE — Progress Notes (Signed)
  Echocardiogram 2D Echocardiogram has been performed.  Darlina Sicilian M 07/10/2022, 9:44 AM

## 2022-07-10 NOTE — Addendum Note (Signed)
Encounter addended by: Jerl Mina, RN on: 07/10/2022 10:57 AM  Actions taken: Order list changed, Diagnosis association updated, Clinical Note Signed, Charge Capture section accepted

## 2022-07-10 NOTE — Progress Notes (Signed)
Advanced Heart Failure Clinic Note   Date:  07/10/2022   ID:  Gregory [name], DOB 05-09-1961, MRN [id number]  Location: Home  Provider location: [place] [facility] Type of Visit: Established patient  PCP:  [name], MD  Cardiologist:  None Primary HF: Gregory [name]  Chief Complaint: Heart Failure follow-up   History of Present Illness:  Gregory [name] is a 62 y/o male with h/o obesity, metastatic R renal cell CA s/p R nephrectomy in 9/17 with the subsequent discovery of 2 pulmonary nodules whom we follow for non-ischemic cardiomyopathy.   Developed microscopic hematuria and found to have large right renal mass c/w renal cell CA. Eventually underwent right radical nephrectomy with adrenal sparing on 02/07/16 at [facility]. Post-op developed bradycardia with HRs in 30s while sleeping. BP stable. When awakened had dizziness so given atropine. ECG revealed sinus brady in 40s with mild QT prolongation which resolved. Echo done and EF 45% with global HK. RV dilated  With normal function .   We saw him for the first time in October 2017. At that time we ordered cMRI and stress test. However in the interim he went back to [facility] Found to have 2 metastatic lung nodules Echo showed EF at 46%. However MUGA with EF 28% so referred back to [place] for further evaluation prior to chemo. Underwent cath 04/09/16 as below. Had chemo and lung nodule resection.    1. 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 Otherwise normal coronaries 3. Nonischemic, dilated cardiomyopathy with EF 30-35% and global hypokinesis   EF 12/21 EF 30% RV normal  Echo 12/13/19 EF 20-25%. RV normal  cMRI 05/29/16 EF 28% No LGE cMRI 2/22 EF 20% RV 20% No LGE    Had sleep study which was negative.    Echo 07/29/19 EF 10-15% Mild RV dysfunction. Underwent aggressive med titration with help of PharmDs  Echo 11/22 EF 25-30% RV ok   He returns  for HF Clinic f/u today. Was at [facility] in 1/24. All scans normal without recurrence. (7 years out). Remains on Mounjaro. HgBa1c down 9 -> 8. Denies CP or SOB. Not exercising. Still working part time. On the road a lot.   Echo today 07/10/21: EF 20-25%. RV ok   Echos:     Echo 5/20 EF 35-40% Echo 09/12/16 35-40%, RV ok. - Reviewed personally  Echo 05/22/17: EF 50-55%, grade 1 DD.     Past Medical History:  Diagnosis Date   Allergy    Anemia    low iron   Anxiety    Cancer (HCC)    Renal cell cancer   CHF (congestive heart failure) (HCC)    Complex tear of lateral meniscus of right knee as current injury 03/20/2017   Complex tear of medial meniscus of right knee 03/20/2017   Diabetes mellitus without complication ([place])    History of gastrointestinal hemorrhage    Hypertension    Hypertriglyceridemia    Macular degeneration    lasar surgery- Dr Zigmund Rossetta [place]   Neuropathy    Nonischemic cardiomyopathy ([name])    EF 25-30% by echo 09/12/16   Plantar warts    PONV (postoperative nausea and vomiting)    " a little bit of nausea"   Primary localized osteoarthritis of right knee 03/20/2017   Renal cell carcinoma ([place])    s/p nephrectomy 02/07/16 at [facility])   Past Surgical History:  Procedure  Laterality Date   CARDIAC CATHETERIZATION N/A 04/19/2016   Procedure: Left Heart Cath and Coronary Angiography;  Surgeon: Jolaine Artist, MD;  Location: [place] [facility];  Service: Cardiovascular;  Laterality: N/A;   CHONDROPLASTY Right 03/20/2017   Procedure: CHONDROPLASTY;  Surgeon: Marchia Bond, MD;  Location: [place];  Service: Orthopedics;  Laterality: Right;   COLONOSCOPY  03/22/2005   EYE SURGERY Right    laser surgery, right eye macular degeneration   KNEE ARTHROSCOPY Right    x 2   KNEE ARTHROSCOPY WITH MEDIAL MENISECTOMY Right 03/20/2017   Procedure: KNEE ARTHROSCOPY WITH MEDIAL AND LATERAL MENISECTOMY;  Surgeon: Marchia Bond, MD;  Location: [place];  Service: Orthopedics;   Laterality: Right;   NEPHRECTOMY Right    orif fx fibula     sebaceous cyst excision  1985   TONSILLECTOMY     TOTAL SHOULDER ARTHROPLASTY Left 11/11/2014   Procedure: LEFT TOTAL SHOULDER ARTHROPLASTY;  Surgeon: Netta Cedars, MD;  Location: [place];  Service: Orthopedics;  Laterality: Left;   VASECTOMY       Current Outpatient Medications  Medication Sig Dispense Refill   atorvastatin (LIPITOR) 80 MG tablet Take 1 tablet (80 mg total) by mouth daily. 90 tablet 3   carvedilol (COREG) 25 MG tablet Take 1 tablet (25 mg total) by mouth 2 (two) times daily with a meal. 180 tablet 3   cetirizine (ZYRTEC) 10 MG tablet Take 10 mg by mouth daily.     Cholecalciferol 50 MCG (2000 UT) TABS Take 2 tablets (4,000 Units total) by mouth daily. 180 tablet 1   Continuous Blood Gluc Sensor (FREESTYLE LIBRE 14 DAY SENSOR) MISC U UTD     digoxin (LANOXIN) 0.125 MG tablet TAKE 1 TABLET(125 MCG) BY MOUTH DAILY 90 tablet 3   ENTRESTO 97-103 MG TAKE 1 TABLET BY MOUTH TWICE DAILY 180 tablet 3   fluticasone (CUTIVATE) 0.005 % ointment Apply topically.     furosemide (LASIX) 40 MG tablet TAKE 1 TABLET( 40 MG TOTAL) BY MOUTH 3 TIMES A WEEK. MONDAY, WEDNESDAY, FRIDAY- MAY TAKE ADDITIONAL AS NEEDED 180 tablet 0   Insulin Aspart, w/Niacinamide, (FIASP [place]) Inject 24 Units into the skin. W sliding scale as needed     levocetirizine (XYZAL) 5 MG tablet Take 5 mg by mouth every evening.     levothyroxine (SYNTHROID, LEVOTHROID) 25 MCG tablet TAKE 1 TABLET ONCE DAILY BEFORE BREAKFAST 30 tablet 0   PARoxetine (PAXIL) 30 MG tablet TAKE 1 TABLET(30 MG) BY MOUTH DAILY 90 tablet 1   spironolactone (ALDACTONE) 25 MG tablet TAKE 1/2 TABLET(12.5 MG) BY MOUTH DAILY 45 tablet 3   thiamine (VITAMIN B1) 100 MG tablet TAKE 1 TABLET(100 MG) BY MOUTH EVERY OTHER DAY 45 tablet 1   Tirzepatide (MOUNJARO [place]) Inject 12.5 mg into the skin once a week.     No current facility-administered medications for this encounter.    Allergies:    Patient has no known allergies.   Social History:  The patient  reports that he has never smoked. He has never used smokeless tobacco. He reports current alcohol use. He reports that he does not use drugs.   Family History:  The patient's family history includes Bipolar disorder in his mother; Cancer (age of onset: 72) in his mother; Hypertension in his mother; Other in his father.   ROS:  Please see the history of present illness.   All other systems are personally reviewed and negative.   Vitals:   07/10/22 0945  BP:  112/70  Pulse: 80  SpO2: 96%  Weight: 124.1 kg (273 lb 9.6 oz)   Wt Readings from Last 3 Encounters:  07/10/22 124.1 kg (273 lb 9.6 oz)  04/24/22 125.2 kg (276 lb)  12/06/21 123.4 kg (272 lb)   Body mass index is 36.1 kg/m.  Exam:   General:  Well appearing. No resp difficulty HEENT: normal Neck: supple. no JVD. Carotids 2+ bilat; no bruits. No lymphadenopathy or thryomegaly appreciated. Cor: PMI nondisplaced. Regular rate & rhythm. No rubs, gallops or murmurs. Lungs: clear Abdomen: obese soft, nontender, nondistended. No hepatosplenomegaly. No bruits or masses. Good bowel sounds. Extremities: no cyanosis, clubbing, rash, edema Neuro: alert & orientedx3, cranial nerves grossly intact. moves all 4 extremities w/o difficulty. Affect pleasant   NSR 93 IVCD 100 ms Personally reviewed   Recent Labs: 10/29/2021: BUN 20; Creatinine, Ser 1.11; Hemoglobin 12.7; Platelets 178.0; Potassium 4.2; Sodium 138  Personally reviewed   Wt Readings from Last 3 Encounters:  07/10/22 124.1 kg (273 lb 9.6 oz)  04/24/22 125.2 kg (276 lb)  12/06/21 123.4 kg (272 lb)      ASSESSMENT AND PLAN:  1. Chronic systolic HF due to NICM   - Unclear etiology. EF 30-35% by cath with NICM. cMRI 1/18 with EF 28%. Etiology remains unclear. No evidence of scar of infiltrative process on MRI. Echo 4/18 EF 35-40%. Echo 04/2017 EF 50-55% - Echo 07/29/19 EF 10-15% Mild RV - Echo 12/13/19 EF 20-25%.  RV normal.  - Echo 12/21 EF 30% Personally reviewed - cMRI 2/22 EF 20% RV 20% No LGE - Echo  04/18/21 EF 25-30% RV ok   - Echo today 07/10/21: EF 20-25%. RV ok  - Stable NYHA II - Volume status looks good - Continue carvedilol 25 bid - Continue Entresto 97/103 mg bid.  - Continue spiro 12.5 will not increase with previous hyperkalemia - No SGLT2i with Type I DM - Now willing to consider ICD will refer to Dr. Caryl Comes (EP) - Will refer for Genetic eval - Consider CPX testing down the road - Labs today   2. Metastatic Renal Cell CA with lung nodules - s/p nephrectomy.  - Follows at [facility]. Felt to be in readmission x 7 years    3. HTN:  -  Blood pressure well controlled. Continue current regimen.  4. CAD - 1v in small RCA.  - No s/s angina - Continue low-dose ASA and statin - Recent LDL 105.  - Continue atorva 80   5. 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 - Followed by Dr. Buddy Duty - Last A1c ~8  6. Obesity - continue Nickola Major, Glori Bickers, MD  07/10/2022 10:32 AM  Advanced Heart Failure [place] [address] [address] [phone] (office) [phone] (fax)

## 2022-07-10 NOTE — Patient Instructions (Signed)
There has been no changes to your medications.  Labs done today, your results will be available in MyChart, we will contact you for abnormal readings.  You have been referred to Dr. Caryl Comes for an ICD Evaluation. His office will call you to arrange your appointment.  You have been referred to Dr. Nira Conn for Genetic Counseling. Her office will call you to arrange your appointment.  Your physician recommends that you schedule a follow-up appointment in: 6 months ( August 2024)  ** please call the office in May to arrange your follow up appointment. **  If you have any questions or concerns before your next appointment please send [place] a message through [place] or call our office at [phone].    TO LEAVE A MESSAGE FOR THE NURSE SELECT OPTION 2, PLEASE LEAVE A MESSAGE INCLUDING: YOUR NAME DATE OF BIRTH CALL BACK NUMBER REASON FOR CALL**this is important as we prioritize the call backs  YOU WILL RECEIVE A CALL BACK THE SAME DAY AS LONG AS YOU CALL BEFORE 4:00 PM  At the [facility], you and your health needs are our priority. As part of our continuing mission to provide you with exceptional heart care, we have created designated Provider Care Teams. These Care Teams include your primary Cardiologist (physician) and Advanced Practice Providers (APPs- Physician Assistants and Nurse Practitioners) who all work together to provide you with the care you need, when you need it.   You may see any of the following providers on your designated Care Team at your next follow up: Dr Glori Bickers Dr Loralie Champagne Dr. Roxana Hires, NP Lyda Jester, [place] [facility] [place], [place] Forestine Na, NP Audry Riles, PharmD   Please be sure to bring in all your medications bottles to every appointment.    Thank you for choosing [facility]

## 2022-07-23 DIAGNOSIS — Z794 Long term (current) use of insulin: Secondary | ICD-10-CM | POA: Diagnosis not present

## 2022-07-23 DIAGNOSIS — E1165 Type 2 diabetes mellitus with hyperglycemia: Secondary | ICD-10-CM | POA: Diagnosis not present

## 2022-07-23 DIAGNOSIS — E349 Endocrine disorder, unspecified: Secondary | ICD-10-CM | POA: Diagnosis not present

## 2022-07-23 DIAGNOSIS — E039 Hypothyroidism, unspecified: Secondary | ICD-10-CM | POA: Diagnosis not present

## 2022-07-24 ENCOUNTER — Encounter: Payer: BC Managed Care – PPO | Attending: Internal Medicine | Admitting: Dietician

## 2022-07-24 ENCOUNTER — Encounter: Payer: Self-pay | Admitting: Dietician

## 2022-07-24 VITALS — Ht 73.0 in | Wt 275.0 lb

## 2022-07-24 DIAGNOSIS — Z794 Long term (current) use of insulin: Secondary | ICD-10-CM | POA: Insufficient documentation

## 2022-07-24 DIAGNOSIS — E1165 Type 2 diabetes mellitus with hyperglycemia: Secondary | ICD-10-CM | POA: Insufficient documentation

## 2022-07-24 NOTE — Patient Instructions (Signed)
Aim for 150 minutes of physical activity weekly. Goal: Walk for 30 minutes 3 days a week. Add to [employer] calendar.    Eat more Non-Starchy Vegetables. Goal: aim to make 1/2 of your plate vegetables at least 2x/day   Aim to eat within 1-2 hours of waking up and every 3-5 hours following.    Goal: aim to drink 1 24oz bottle by lunch and 1 more by the end of the day.

## 2022-07-24 NOTE — Progress Notes (Signed)
Diabetes Self-Management Education  Visit Type: Follow-up  Appt. Start Time: 0815 Appt. End Time: 0845  07/24/2022  [name], identified by name and date of birth, is a 62 y.o. male with a diagnosis of Diabetes:  .   ASSESSMENT    History includes: allergies, arthritis, anemia, anxiety, cancer, CHF, type 2 diabetes, HLD, HTN, kidney disease, sleep apnea.  Labs noted: 04/22/22 A1c 8.4% Medications include: insulin aspart and mounjaro Supplements: thiamine, vitamin D. Wt History: 04/24/22: 276 lbs 07/24/22: 275 lbs  Pt is present today with his wife.   Pt reports he has not been exercising but wants to start doing a 30 minute walk in the mornings.   Pt states since the last time he has been to this RD he found out he has a low ejection fraction and is now worried about keeping his heart healthy.   Pt reports he visited his endocrinologist yesterday who encouraged him to exercise and took his A1c but pt does not have results yet.   Assessed pt's Freestyle Libre CGM: time in range x30 days 44%, x90 days 44%.  Pt states he has been trying to emphasize protein in meals and snacks and including more vegetables and fruit in his diet.   Pt's wife reports she packs pt cheese and fruit for his long drives because he will sometimes go a while without eating and his blood sugar gets low.   Pt reports he has stopped feeling the need to clean his plate at mealtimes and does not feel the desire to always have dessert anymore.    Height '6\' 1"'$  (1.854 m), weight 275 lb (124.7 kg). Body mass index is 36.28 kg/m.   Diabetes Self-Management Education - 07/24/22 0815       Visit Information   Visit Type Follow-up      Health Coping   How would you rate your overall health? Good      Psychosocial Assessment   Patient Belief/Attitude about Diabetes Motivated to manage diabetes    What is the hardest part about your diabetes right now, causing you the most concern, or is the most  worrisome to you about your diabetes?   Making healty food and beverage choices    Self-care barriers None    Self-management support Doctor's office    Other persons present Patient;Spouse/SO    Patient Concerns Nutrition/Meal planning    Special Needs None    Preferred Learning Style No preference indicated    Learning Readiness Ready    How often do you need to have someone help you when you read instructions, pamphlets, or other written materials from your doctor or pharmacy? 1 - Never      Pre-Education Assessment   Patient understands the diabetes disease and treatment process. Needs Review    Patient understands incorporating nutritional management into lifestyle. Needs Review    Patient undertands incorporating physical activity into lifestyle. Needs Review    Patient understands using medications safely. Needs Review    Patient understands monitoring blood glucose, interpreting and using results Needs Review    Patient understands prevention, detection, and treatment of acute complications. Needs Review    Patient understands prevention, detection, and treatment of chronic complications. Needs Review    Patient understands how to develop strategies to address psychosocial issues. Needs Review    Patient understands how to develop strategies to promote health/change behavior. Needs Review      Complications   How often do you check your blood sugar? >  4 times/day    Fasting Blood glucose range (mg/dL) 130-179    Postprandial Blood glucose range (mg/dL) 180-200      Dietary Intake   Breakfast eggs and blueberries and toast OR greek yogurt with fruit and nuts    Snack (morning) none    Lunch soup and sandwich from [employer] (afternoon) none    Dinner fish or poultry or meat and vegetables    Snack (evening) none    Beverage(s) coffee, 24 oz water      Activity / Exercise   Activity / Exercise Type ADL's    How many days per week do you exercise? 0    How many minutes  per day do you exercise? 0    Total minutes per week of exercise 0      Patient Education   Previous Diabetes Education Yes (please comment)    Disease Pathophysiology Explored patient's options for treatment of their diabetes;Factors that contribute to the development of diabetes    Healthy Eating Role of diet in the treatment of diabetes and the relationship between the three main macronutrients and blood glucose level;Plate Method;Reviewed blood glucose goals for pre and post meals and how to evaluate the patients' food intake on their blood glucose level.;Meal timing in regards to the patients' current diabetes medication.;Information on hints to eating out and maintain blood glucose control.;Meal options for control of blood glucose level and chronic complications.    Being Active Role of exercise on diabetes management, blood pressure control and cardiac health.;Helped patient identify appropriate exercises in relation to his/her diabetes, diabetes complications and other health issue.    Medications Reviewed patients medication for diabetes, action, purpose, timing of dose and side effects.    Monitoring Taught/evaluated CGM (comment)    Chronic complications Relationship between chronic complications and blood glucose control;Identified and discussed with patient  current chronic complications    Diabetes Stress and Support Identified and addressed patients feelings and concerns about diabetes    Lifestyle and Health Coping Lifestyle issues that need to be addressed for better diabetes care      Individualized Goals (developed by patient)   Nutrition General guidelines for healthy choices and portions discussed    Physical Activity Exercise 3-5 times per week;30 minutes per day    Medications take my medication as prescribed    Monitoring  Test my blood glucose as discussed;Consistenly use CGM    Problem Solving Eating Pattern    Reducing Risk examine blood glucose patterns;do foot checks  daily;treat hypoglycemia with 15 grams of carbs if blood glucose less than '70mg'$ /dL    Health Coping Ask for help with psychological, social, or emotional issues      Patient Self-Evaluation of Goals - Patient rates self as meeting previously set goals (% of time)   Nutrition 50 - 75 % (half of the time)    Physical Activity < 25% (hardly ever/never)    Medications >75% (most of the time)    Monitoring >75% (most of the time)    Problem Solving and behavior change strategies  25 - 50% (sometimes)    Reducing Risk (treating acute and chronic complications) 50 - 75 % (half of the time)    Health Coping 50 - 75 % (half of the time)      Post-Education Assessment   Patient understands the diabetes disease and treatment process. Comprehends key points    Patient understands incorporating nutritional management into lifestyle. Comprehends key points  Patient undertands incorporating physical activity into lifestyle. Comprehends key points    Patient understands using medications safely. Demonstrates understanding / competency    Patient understands monitoring blood glucose, interpreting and using results Demonstrates understanding / competency    Patient understands prevention, detection, and treatment of acute complications. Comprehends key points    Patient understands prevention, detection, and treatment of chronic complications. Comprehends key points    Patient understands how to develop strategies to address psychosocial issues. Comprehends key points    Patient understands how to develop strategies to promote health/change behavior. Comprehends key points      Outcomes   Expected Outcomes Demonstrated interest in learning. Expect positive outcomes    Future DMSE 3-4 months    Program Status Not Completed      Subsequent Visit   Since your last visit have you continued or begun to take your medications as prescribed? Yes             Individualized Plan for Diabetes  Self-Management Training:   Learning Objective:  Patient will have a greater understanding of diabetes self-management. Patient education plan is to attend individual and/or group sessions per assessed needs and concerns.   Plan:   Patient Instructions  Aim for 150 minutes of physical activity weekly. Goal: Walk for 30 minutes 3 days a week. Add to [employer] calendar.    Eat more Non-Starchy Vegetables. Goal: aim to make 1/2 of your plate vegetables at least 2x/day   Aim to eat within 1-2 hours of waking up and every 3-5 hours following.    Goal: aim to drink 1 24oz bottle by lunch and 1 more by the end of the day.   Expected Outcomes:  Demonstrated interest in learning. Expect positive outcomes  Education material provided: No handouts provided on this follow-up.   If problems or questions, patient to contact team via:  Phone  Future DSME appointment: 3-4 months

## 2022-08-05 ENCOUNTER — Encounter: Payer: Self-pay | Admitting: Internal Medicine

## 2022-08-05 ENCOUNTER — Ambulatory Visit: Payer: BC Managed Care – PPO | Attending: Internal Medicine | Admitting: Internal Medicine

## 2022-08-05 VITALS — BP 112/70 | HR 82 | Ht 73.0 in | Wt 274.0 lb

## 2022-08-05 DIAGNOSIS — I5022 Chronic systolic (congestive) heart failure: Secondary | ICD-10-CM | POA: Diagnosis not present

## 2022-08-05 DIAGNOSIS — Z79899 Other long term (current) drug therapy: Secondary | ICD-10-CM | POA: Diagnosis not present

## 2022-08-05 NOTE — Progress Notes (Signed)
DB      ELECTROPHYSIOLOGY CONSULT NOTE  Patient ID: SEATH EBANKS, MRN: [id number], DOB/AGE: Sep 20, 1960 62 y.o. Admit date: (Not on file) Date of Consult: 08/05/2022  Primary Physician: [name], MD Primary Cardiologist: DB     OSCAR CARPENITO is a 62 y.o. male who is being seen today for the evaluation of ICD at the request of DB.    HPI KAII CAM is a 62 y.o. male referred for consideration of an ICD  Noncardiac history is notable for metastatic right renal cell cancer status post nephrectomy 9/17 with discovery of subsequent pulmonary nodules these were treated with chemo and lung nodule resection.  Scans 1/24 are negative (7 years out) remains on Mounjaro ()GLP-1)   Long history of cardiomyopathy as noted below some degree of intermittent reversibility.  Symptoms of heart failure are modest with dyspnea on exertion some peripheral edema no nocturnal dyspnea but sleep disordered breathing  1 episode of syncope, while driving a car became hypoglycemic.  Developed diabetes in the wake of immunotherapy for his renal cell carcinoma  No tachy palpitations.   DATE TEST EF   2017 MUGA  28 %   11//17 LHC   % RCA nondominant 90 O/w nonobstructive  12/18 Echo  50-55%   12/21 Echo  30%   2/22 cMRI 20% RV 20%  LGE neg  2/23 Echo  20-25%   2/24 Echo  20-25% RV function normal    Date Cr K Hgb Dig  11.23   13.9   2/24 1.08 4.2               Past Medical History:  Diagnosis Date   Allergy    Anemia    low iron   Anxiety    Cancer (HCC)    Renal cell cancer   CHF (congestive heart failure) (HCC)    Complex tear of lateral meniscus of right knee as current injury 03/20/2017   Complex tear of medial meniscus of right knee 03/20/2017   Diabetes mellitus without complication ([place])    History of gastrointestinal hemorrhage    Hypertension    Hypertriglyceridemia    Macular degeneration    lasar surgery- Dr Zigmund Daniel   Neuropathy    Nonischemic cardiomyopathy ([place])     EF 25-30% by echo 09/12/16   Plantar warts    PONV (postoperative nausea and vomiting)    " a little bit of nausea"   Primary localized osteoarthritis of right knee 03/20/2017   Renal cell carcinoma ([place])    s/p nephrectomy 02/07/16 at [facility])      Surgical History:  Past Surgical History:  Procedure Laterality Date   CARDIAC CATHETERIZATION N/A 04/19/2016   Procedure: Left Heart Cath and Coronary Angiography;  Surgeon: Jolaine Artist, MD;  Location: [facility];  Service: Cardiovascular;  Laterality: N/A;   CHONDROPLASTY Right 03/20/2017   Procedure: CHONDROPLASTY;  Surgeon: Marchia Bond, MD;  Location: [facility];  Service: Orthopedics;  Laterality: Right;   COLONOSCOPY  03/22/2005   EYE SURGERY Right    laser surgery, right eye macular degeneration   KNEE ARTHROSCOPY Right    x 2   KNEE ARTHROSCOPY WITH MEDIAL MENISECTOMY Right 03/20/2017   Procedure: KNEE ARTHROSCOPY WITH MEDIAL AND LATERAL MENISECTOMY;  Surgeon: Marchia Bond, MD;  Location: [facility];  Service: Orthopedics;  Laterality: Right;   NEPHRECTOMY Right    orif fx fibula     sebaceous cyst excision  1985   TONSILLECTOMY  TOTAL SHOULDER ARTHROPLASTY Left 11/11/2014   Procedure: LEFT TOTAL SHOULDER ARTHROPLASTY;  Surgeon: Netta Cedars, MD;  Location: [facility];  Service: Orthopedics;  Laterality: Left;   VASECTOMY       Home Meds: Current Meds  Medication Sig   atorvastatin (LIPITOR) 80 MG tablet Take 1 tablet (80 mg total) by mouth daily.   carvedilol (COREG) 25 MG tablet Take 1 tablet (25 mg total) by mouth 2 (two) times daily with a meal.   cetirizine (ZYRTEC) 10 MG tablet Take 10 mg by mouth daily.   Cholecalciferol 50 MCG (2000 UT) TABS Take 2 tablets (4,000 Units total) by mouth daily.   Continuous Blood Gluc Sensor (FREESTYLE LIBRE 14 DAY SENSOR) MISC U UTD   digoxin (LANOXIN) 0.125 MG tablet TAKE 1 TABLET(125 MCG) BY MOUTH DAILY   ENTRESTO 97-103 MG TAKE 1 TABLET BY MOUTH TWICE DAILY    fluticasone (CUTIVATE) 0.005 % ointment Apply 1 Application topically as needed.   furosemide (LASIX) 40 MG tablet TAKE 1 TABLET( 40 MG TOTAL) BY MOUTH 3 TIMES A WEEK. MONDAY, WEDNESDAY, FRIDAY- MAY TAKE ADDITIONAL AS NEEDED (Patient taking differently: 40 mg as needed. TAKE 1 TABLET( 40 MG TOTAL) BY MOUTH 3 TIMES A WEEK. MONDAY, WEDNESDAY, FRIDAY- MAY TAKE ADDITIONAL AS NEEDED)   Insulin Aspart, w/Niacinamide, (FIASP [place]) Inject 24 Units into the skin. W sliding scale as needed   levocetirizine (XYZAL) 5 MG tablet Take 5 mg by mouth every evening.   levothyroxine (SYNTHROID, LEVOTHROID) 25 MCG tablet TAKE 1 TABLET ONCE DAILY BEFORE BREAKFAST   PARoxetine (PAXIL) 30 MG tablet TAKE 1 TABLET(30 MG) BY MOUTH DAILY   spironolactone (ALDACTONE) 25 MG tablet TAKE 1/2 TABLET(12.5 MG) BY MOUTH DAILY   thiamine (VITAMIN B1) 100 MG tablet TAKE 1 TABLET(100 MG) BY MOUTH EVERY OTHER DAY   Tirzepatide (MOUNJARO [place]) Inject 12.5 mg into the skin once a week.    Allergies: No Known Allergies  Social History   Socioeconomic History   Marital status: Married    Spouse name: Not on file   Number of children: Not on file   Years of education: Not on file   Highest education level: Not on file  Occupational History   Not on file  Tobacco Use   Smoking status: Never   Smokeless tobacco: Never  Vaping Use   Vaping Use: Never used  Substance and Sexual Activity   Alcohol use: Yes    Alcohol/week: 0.0 standard drinks of alcohol    Comment: social   Drug use: No   Sexual activity: Not on file  Other Topics Concern   Not on file  Social History Narrative   Married ' 90   2 sons - '96 '02; 1 daughter  '98   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    Social Determinants of Health   Financial Resource Strain: Not on file  Food Insecurity: Not on file  Transportation Needs: Not on file  Physical Activity: Not on file   Stress: Not on file  Social Connections: Not on file  Intimate Partner Violence: Not on file     Family History  Problem Relation Age of Onset   Hypertension Mother    Bipolar disorder Mother    Cancer Mother 65       Brain   Other Father        brain tumor   Colon cancer Neg Hx  Colon polyps Neg Hx    Rectal cancer Neg Hx    Stomach cancer Neg Hx    Early death Neg Hx    Hyperlipidemia Neg Hx    Kidney disease Neg Hx      ROS:  Please see the history of present illness.     All other systems reviewed and negative.    Physical Exam: Blood pressure 112/70, pulse 82, height '6\' 1"'$  (1.854 m), weight 274 lb (124.3 kg), SpO2 97 %. General: Well developed, Morbidly obese  male in no acute distress. Head: Normocephalic, atraumatic, sclera non-icteric, no xanthomas, nares are without discharge. EENT: normal  Lymph Nodes:  none Neck: Negative for carotid bruits. JVD not elevated. Back:without scoliosis kyphosis asymmetric, he has a scar underneath his left scapula from her suspected lipoma as a young ma Lungs: Clear bilaterally to auscultation without wheezes, rales, or rhonchi. Breathing is unlabored. Heart: RRR with S1 S2. No   murmur . No rubs, or gallops appreciated. Abdomen: Soft, non-tender, non-distended with normoactive bowel sounds. No hepatomegaly. No rebound/guarding. No obvious abdominal masses. Msk:  Strength and tone appear normal for age. Extremities: No clubbing or cyanosis. No edema.  Distal pedal pulses are 2+ and equal bilaterally. Skin: Warm and Dry Neuro: Alert and oriented X 3. CN III-XII intact Grossly normal sensory and motor function . Psych:  Responds to questions appropriately with a normal affect.        EKG: NSR 81 19/11/39 LAD PVC isolated    Assessment and Plan:  Nonischemic cardiomyopathy question mechanism  Congestive heart failure class II  Morbid obesity  Diabetes  PVC-isolated  S/p renal  cell carcinoma resection and  immunotherapy  The patient has persistent left ventricular dysfunction despite guideline directed medical therapy.  At the age of 32 is appropriately considered for ICD implantation for primary prevention based on the [place] trial results.  We have reviewed these results and the potential risks as well as the benefits of ICD implantation.  He would like to discuss this further with his wife and then get back to [place].  We will check a dig level.  He had multiple questions also about exercise and would stress the importance of avoiding anaerobic exercise but the cardio/aerobic exercise was important.  His PVC was isolated not seen on prior ECGs.  Genetic's evaluation is pending   Virl Axe

## 2022-08-05 NOTE — Patient Instructions (Signed)
Medication Instructions:  Your physician recommends that you continue on your current medications as directed. Please refer to the Current Medication list given to you today.  *If you need a refill on your cardiac medications before your next appointment, please call your pharmacy*   Lab Work: Digoxin level today If you have labs (blood work) drawn today and your tests are completely normal, you will receive your results only by: [place] (if you have MyChart) OR A paper copy in the mail If you have any lab test that is abnormal or we need to change your treatment, we will call you to review the results.   Testing/Procedures: None ordered.    Follow-Up: At [facility], you and your health needs are our priority.  As part of our continuing mission to provide you with exceptional heart care, we have created designated Provider Care Teams.  These Care Teams include your primary Cardiologist (physician) and Advanced Practice Providers (APPs -  Physician Assistants and Nurse Practitioners) who all work together to provide you with the care you need, when you need it.  We recommend signing up for the patient portal called "MyChart".  Sign up information is provided on this After Visit Summary.  MyChart is used to connect with patients for Virtual Visits (Telemedicine).  Patients are able to view lab/test results, encounter notes, upcoming appointments, etc.  Non-urgent messages can be sent to your provider as well.   To learn more about what you can do with MyChart, go to [email or web address].    Your next appointment:   Please call me next week at [phone] or send me a MyChart message on your decision regarding moving forward with ICD implant.

## 2022-08-06 ENCOUNTER — Ambulatory Visit: Payer: BC Managed Care – PPO | Attending: Genetic Counselor | Admitting: Genetic Counselor

## 2022-08-06 DIAGNOSIS — I5022 Chronic systolic (congestive) heart failure: Secondary | ICD-10-CM

## 2022-08-07 LAB — DIGOXIN LEVEL: Digoxin, Serum: 0.5 ng/mL (ref 0.5–0.9)

## 2022-08-12 ENCOUNTER — Other Ambulatory Visit: Payer: Self-pay

## 2022-08-12 ENCOUNTER — Other Ambulatory Visit (HOSPITAL_COMMUNITY): Payer: Self-pay

## 2022-08-12 ENCOUNTER — Other Ambulatory Visit: Payer: Self-pay | Admitting: Internal Medicine

## 2022-08-12 MED ORDER — MOUNJARO 15 MG/0.5ML ~~LOC~~ SOAJ
15.0000 mg | SUBCUTANEOUS | 0 refills | Status: AC
  Filled 2022-08-12 – 2022-09-09 (×2): qty 2, 28d supply, fill #0

## 2022-08-12 MED ORDER — SPIRONOLACTONE 25 MG PO TABS: ORAL_TABLET | ORAL | 3 refills | Status: DC

## 2022-08-12 NOTE — Progress Notes (Cosign Needed Addendum)
Pre Test Genetic Consult  Referring Provider: Glori Bickers, MD    Referral Reason  Gregory [name] was referred for genetic consult and testing for Non-ischemic cardiomyopathy (NICM).  [name] (III.2 on pedigree) is a very pleasant 62 year-old Caucasian gentleman who is here today with his wife, Gregory [name]. 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 He has a past history of kidney cancer at age 68 and was treated with immunotherapy. He reports feeling winded and having difficult walking while visiting [place] at that time. He continues to have shortness of breath and mentions a couple syncopal events that were attributed to hypoglycemia and a kidney stone, respectively.   While undergoing cancer treatment, a MUGA scan detected depressed EF of 28% with his most recent cardiac MRI detected an EF of 20% with mild left ventricular dilation and hypertrophy and prominent trabeculae.  Traditional Risk Factors Gregory [name] denies having other cardiac or systemic conditions that can cause non-ischemic cardiomyopathy, namely, myocarditis, ischemic heart disease, infiltrative myocardial disease (amyloidosis, sarcoidosis, hemochromatosis), heart valvular disease, infection with HIV virus, connective tissue disease (such as systemic lupus erythematosus etc.). He also denies substance abuse (chronic alcohol abuse, cocaine abuse), doxorubicin therapy.   He reports having HTN in his later 44s that is now controlled with medication.    Family history  Relation to [place]band Pedigree # Current age Heart condition/age of onset Notes  Sons, 2 IV.3, IV.5 95, 21 None   Daughter IV.4 26 None   Sister III.1 65 None Does not seek medical care  [place], 2 IV.1-IV.2 52, 25 None         Father II.2 Deceased CHF @ 69 Died @ 68 from CHF  Paternal aunt II.1 66 None   Paternal grandfather 1.1 Deceased None Sudden death @ 35  Paternal grandmother I.2 Deceased None Died @  31s- poor health        Mother II.3 Deceased None Died @ 74 from brain cancer  Maternal uncle II.4 Deceased None Died @ 61s in a flight training mission accident  Maternal aunt II.5 Deceased None Died @ 108 from [place]  Maternal grandfather I.3 Deceased None Died @ 69s from M.I.  Maternal grandmother I.4 Deceased None Died @ 45 from CHF   Pre-test Genetic Consult notes  I reviewed the different genetic cardiomyopathies that are considered non-ischemic cardiomyopathy, namely ARVC, DCM, HCM and LVNC. I discussed the genetics of HCM, DCM, LVNC and ACM, namely inheritance, incomplete penetrance, variable expression, and digenic/compound mutations that can be seen in some patients.   We walked through the process of genetic testing. I explained to him that there are three possible outcomes of genetic testing; namely positive, negative, and finding a variant of unknown significance. A positive outcome can be expected in cases that do not have risk factors for a cardiomyopathy, present early in life with increased severity and have a family history of sudden cardiac death and/or a relative that has been diagnosed with cardiomyopathy. Limitations in current genetic testing methodology can produce a negative result. Variants of unknown significance (VUS) are also observed. I explained to him that typically a VUS is so classified if the variant is not well understood as very few individuals have been reported to harbor this variant or its role in gene function has not been elucidated. The potential outcomes of genetic testing and subsequent management of at-risk family members were discussed to manage expectations.   Impression  In summary, Gregory [name] early age and severity  of clinical presentation in the absence of risk factors along with family history of CHF in father and sudden death in paternal grandfather at a young age is indicative of a genetic condition.   Genetic testing for genes implicated in  nonischemic cardiomyopathy is highly recommended to confirm his diagnosis. The test will determine the underlying genetic basis of his condition and stratify risk of NICM in his family.   In addition, we discussed the protections afforded by the Genetic Information Non-Discrimination Act (GINA). I explained to him that GINA protects him from losing his employment or health insurance based on his genotype. However, these protections do not cover life insurance and disability. He tells me that his children have life insurance.  Please note that the patient has not been counseled in this visit on personal, cultural, or ethical issues that he may face due to his heart condition.   Plan After a thorough discussion of the risk and benefits of genetic testing for NICM, [place] states his intent to pursue genetic testing for NICM and signed the informed consent form. Blood was drawn today for testing.   Lattie Corns, Ph.D, [facility] Clinical Molecular Geneticist

## 2022-08-12 NOTE — Telephone Encounter (Signed)
This is a CHF pt 

## 2022-08-21 DIAGNOSIS — I5022 Chronic systolic (congestive) heart failure: Secondary | ICD-10-CM | POA: Diagnosis not present

## 2022-08-22 DIAGNOSIS — I5022 Chronic systolic (congestive) heart failure: Secondary | ICD-10-CM | POA: Diagnosis not present

## 2022-08-26 DIAGNOSIS — I5022 Chronic systolic (congestive) heart failure: Secondary | ICD-10-CM | POA: Diagnosis not present

## 2022-08-27 DIAGNOSIS — I5022 Chronic systolic (congestive) heart failure: Secondary | ICD-10-CM | POA: Diagnosis not present

## 2022-08-28 DIAGNOSIS — I5022 Chronic systolic (congestive) heart failure: Secondary | ICD-10-CM | POA: Diagnosis not present

## 2022-08-29 DIAGNOSIS — I5022 Chronic systolic (congestive) heart failure: Secondary | ICD-10-CM | POA: Diagnosis not present

## 2022-09-09 ENCOUNTER — Other Ambulatory Visit (HOSPITAL_COMMUNITY): Payer: Self-pay

## 2022-09-09 ENCOUNTER — Other Ambulatory Visit: Payer: Self-pay

## 2022-09-10 ENCOUNTER — Other Ambulatory Visit (HOSPITAL_COMMUNITY): Payer: Self-pay

## 2022-09-10 ENCOUNTER — Encounter (HOSPITAL_COMMUNITY): Payer: Self-pay

## 2022-09-13 ENCOUNTER — Other Ambulatory Visit: Payer: Self-pay

## 2022-09-18 ENCOUNTER — Other Ambulatory Visit (HOSPITAL_COMMUNITY): Payer: Self-pay | Admitting: *Deleted

## 2022-09-18 DIAGNOSIS — I5022 Chronic systolic (congestive) heart failure: Secondary | ICD-10-CM

## 2022-10-10 DIAGNOSIS — E039 Hypothyroidism, unspecified: Secondary | ICD-10-CM | POA: Diagnosis not present

## 2022-10-10 DIAGNOSIS — E1165 Type 2 diabetes mellitus with hyperglycemia: Secondary | ICD-10-CM | POA: Diagnosis not present

## 2022-10-10 DIAGNOSIS — E349 Endocrine disorder, unspecified: Secondary | ICD-10-CM | POA: Diagnosis not present

## 2022-10-10 LAB — LIPID PANEL
Cholesterol: 164 (ref 0–200)
HDL: 39 (ref 35–70)
LDL Cholesterol: 81
Triglycerides: 254 — AB (ref 40–160)

## 2022-10-10 LAB — HEMOGLOBIN A1C: Hemoglobin A1C: 7.9

## 2022-10-14 ENCOUNTER — Encounter: Payer: Self-pay | Admitting: Internal Medicine

## 2022-10-17 ENCOUNTER — Telehealth: Payer: Self-pay

## 2022-10-17 NOTE — Telephone Encounter (Signed)
Spoke with pt to follow up re:ICD implantation.  Pt states he has had genetic testing completed and will follow up with Dr Jomarie Longs in June.  He is uncertain at this time if he will purse ICD implantation.  He also has f/u with CHF clinic 12/2022.  He would like the CPT code to provide to his insurance company to see if they can provide what his out of pocket expense may be should he decide to move forward with implant.  Pt advised will forward CPT through MyChart.  Requested pt contact office with any additional questions or concerns or if decides he would like to schedule ICD implant.  Pt verbalizes understanding and thanked Charity fundraiser for the phone call.

## 2022-10-22 ENCOUNTER — Ambulatory Visit: Payer: BC Managed Care – PPO | Admitting: Dietician

## 2022-10-23 ENCOUNTER — Other Ambulatory Visit: Payer: Self-pay | Admitting: Internal Medicine

## 2022-10-23 DIAGNOSIS — F418 Other specified anxiety disorders: Secondary | ICD-10-CM

## 2022-11-01 DIAGNOSIS — N480 Leukoplakia of penis: Secondary | ICD-10-CM | POA: Diagnosis not present

## 2022-11-01 DIAGNOSIS — Z85528 Personal history of other malignant neoplasm of kidney: Secondary | ICD-10-CM | POA: Diagnosis not present

## 2022-11-11 ENCOUNTER — Encounter: Payer: Self-pay | Admitting: Internal Medicine

## 2022-11-11 DIAGNOSIS — I5022 Chronic systolic (congestive) heart failure: Secondary | ICD-10-CM

## 2022-11-12 ENCOUNTER — Ambulatory Visit: Payer: BC Managed Care – PPO | Admitting: Genetic Counselor

## 2022-11-19 ENCOUNTER — Ambulatory Visit: Payer: BC Managed Care – PPO | Attending: Genetic Counselor | Admitting: Genetic Counselor

## 2022-12-04 NOTE — Telephone Encounter (Signed)
Spoke with patient, he would like to get a cardiac MRI before he has an ICD implanted but I don't see an order for this - would you like to order this for him? This is a patient request

## 2022-12-05 ENCOUNTER — Encounter (INDEPENDENT_AMBULATORY_CARE_PROVIDER_SITE_OTHER): Payer: BC Managed Care – PPO | Admitting: Ophthalmology

## 2022-12-05 DIAGNOSIS — H353122 Nonexudative age-related macular degeneration, left eye, intermediate dry stage: Secondary | ICD-10-CM | POA: Diagnosis not present

## 2022-12-05 DIAGNOSIS — H43813 Vitreous degeneration, bilateral: Secondary | ICD-10-CM

## 2022-12-05 DIAGNOSIS — H353211 Exudative age-related macular degeneration, right eye, with active choroidal neovascularization: Secondary | ICD-10-CM | POA: Diagnosis not present

## 2022-12-05 NOTE — Telephone Encounter (Signed)
Attempted to contact patient, left message. Dr Gala Romney approved patient request for a cardiac MRI - order placed for MRI and CBC (unable to speak to patient to schedule lab work)  Bensimhon, Bevelyn Buckles, MD  Festus Holts, RN Cc: Duke Salvia, MD [facility] to order cMRI. Thanks

## 2022-12-10 ENCOUNTER — Ambulatory Visit: Payer: BC Managed Care – PPO | Attending: Genetic Counselor | Admitting: Genetic Counselor

## 2022-12-10 NOTE — Progress Notes (Signed)
Post-test Genetic Consultation  Gregory [name] is here today for his post-test genetic consult of NICM test. This is a telemedicine visit. Patient identity was confirmed with 2 unique identifiers. He was on his way to a site visit while we were on the phone today.  Gregory [name] reports feeling much better since we met. He does not have fatigue or pedal edema. We then reviewed his family tree and he notes no major changes in the medical status of his sister and children.   I informed Gregory [name] that his genetic test did not identify a pathogenic variant in the genes implicated in nonischemic cardiomyopathy. He was very relieved to hear this. I explained to him that he likely has an idiopathic condition and it is not caused by the major genes implicated in cardiomyopathy.   Even though he has a negative genetic test, I emphasized that it is important for his sister and children to stay vigilant and seek medical care if they have symptoms related to a heart condition. In light of the history of sudden death and CHF in his paternal relatives, it is recommended that they seek regular surveillance by echocardiogram and EKG. He verbalized understanding of this.  Gregory [name], Ph.D, [facility] Clinical Molecular Geneticist

## 2022-12-25 ENCOUNTER — Encounter (INDEPENDENT_AMBULATORY_CARE_PROVIDER_SITE_OTHER): Payer: Self-pay

## 2022-12-28 ENCOUNTER — Other Ambulatory Visit: Payer: Self-pay | Admitting: Internal Medicine

## 2022-12-28 DIAGNOSIS — F418 Other specified anxiety disorders: Secondary | ICD-10-CM

## 2023-01-10 ENCOUNTER — Ambulatory Visit: Payer: BC Managed Care – PPO | Admitting: Medical

## 2023-01-10 VITALS — BP 126/72 | HR 74 | Ht 74.0 in | Wt 269.2 lb

## 2023-01-10 DIAGNOSIS — R350 Frequency of micturition: Secondary | ICD-10-CM | POA: Diagnosis not present

## 2023-01-10 DIAGNOSIS — E1165 Type 2 diabetes mellitus with hyperglycemia: Secondary | ICD-10-CM

## 2023-01-10 DIAGNOSIS — E118 Type 2 diabetes mellitus with unspecified complications: Secondary | ICD-10-CM | POA: Diagnosis not present

## 2023-01-10 DIAGNOSIS — R300 Dysuria: Secondary | ICD-10-CM

## 2023-01-10 DIAGNOSIS — Z7985 Long-term (current) use of injectable non-insulin antidiabetic drugs: Secondary | ICD-10-CM

## 2023-01-10 DIAGNOSIS — Z794 Long term (current) use of insulin: Secondary | ICD-10-CM

## 2023-01-10 LAB — POC URINALSYSI DIPSTICK (AUTOMATED)
Bilirubin, UA: NEGATIVE
Glucose, UA: NEGATIVE
Ketones, UA: NEGATIVE
Nitrite, UA: NEGATIVE
Protein, UA: POSITIVE — AB
Spec Grav, UA: 1.015 (ref 1.010–1.025)
Urobilinogen, UA: 0.2 E.U./dL
pH, UA: 6 (ref 5.0–8.0)

## 2023-01-10 MED ORDER — SULFAMETHOXAZOLE-TRIMETHOPRIM 800-160 MG PO TABS: 1.0000 | ORAL_TABLET | Freq: Two times a day (BID) | ORAL | 0 refills | Status: DC

## 2023-01-10 MED ORDER — CEFTRIAXONE SODIUM 1 G IJ SOLR
1.0000 g | Freq: Once | INTRAMUSCULAR | Status: AC
  Administered 2023-01-10: 1 g via INTRAMUSCULAR

## 2023-01-10 NOTE — Progress Notes (Signed)
Subjective:    Patient ID: Gregory [name], male    DOB: 1960/06/19, 62 y.o.   MRN: [id number]  HPI  Discussed the use of AI scribe software for clinical note transcription with the patient, who gave verbal consent to proceed.  History of Present Illness   The patient, with a history of diabetes and prior nephrectomy, presented with a new onset of dysuria starting earlier in the day. He reported frequent urination, having gone to the bathroom two to three times in a short interval today, but felt he was emptying their bladder fully each time. He denied any associated fevers, chills, sweats, nausea, or vomiting. He has no known history of urinary tract infections or prostatitis.  The patient's blood glucose levels have been fluctuating, with a recent reading of 262. He has been managing his diabetes for approximately four to five years. Despite high glucose levels in the past, he has not previously experienced any impact on his urinary symptoms.  In February 2024, his creatinine was 1.08 and GFR was greater than 60, indicating good kidney function despite his prior nephrectomy.        Review of Systems  Constitutional:  Negative for chills, fatigue and fever.  Respiratory:  Negative for cough, chest tightness, wheezing and stridor.   Cardiovascular:  Negative for chest pain and palpitations.  Gastrointestinal:  Negative for abdominal pain, diarrhea and nausea.  Genitourinary:  Positive for dysuria and frequency. Negative for testicular pain and urgency.  Musculoskeletal:  Negative for back pain and myalgias.  Neurological:  Negative for facial asymmetry and numbness.  Hematological:  Negative for adenopathy. Does not bruise/bleed easily.    Past Medical History:  Diagnosis Date   Allergy    Anemia    low iron   Anxiety    Cancer (HCC)    Renal cell cancer   CHF (congestive heart failure) (HCC)    Complex tear of lateral meniscus of right knee as current injury 03/20/2017   Complex  tear of medial meniscus of right knee 03/20/2017   Diabetes mellitus without complication (HCC)    History of gastrointestinal hemorrhage    Hypertension    Hypertriglyceridemia    Macular degeneration    lasar surgery- Dr [name]   Neuropathy    Nonischemic cardiomyopathy (HCC)    EF 25-30% by echo 09/12/16   Plantar warts    PONV (postoperative nausea and vomiting)    " a little bit of nausea"   Primary localized osteoarthritis of right knee 03/20/2017   Renal cell carcinoma (HCC)    s/p nephrectomy 02/07/16 at [facility])     Social History   Socioeconomic History   Marital status: Married    Spouse name: Not on file   Number of children: Not on file   Years of education: Not on file   Highest education level: Not on file  Occupational History   Not on file  Tobacco Use   Smoking status: Never   Smokeless tobacco: Never  Vaping Use   Vaping status: Never Used  Substance and Sexual Activity   Alcohol use: Yes    Alcohol/week: 0.0 standard drinks of alcohol    Comment: social   Drug use: No   Sexual activity: Not on file  Other Topics Concern   Not on file  Social History Narrative   Married ' 90   2 sons - '96 '02; 1 daughter  '98   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    Social Determinants of Health   Financial Resource Strain: Not on file  Food Insecurity: Not on file  Transportation Needs: Not on file  Physical Activity: Not on file  Stress: Not on file  Social Connections: Not on file  Intimate Partner Violence: Not on file    Past Surgical History:  Procedure Laterality Date   CARDIAC CATHETERIZATION N/A 04/19/2016   Procedure: Left Heart Cath and Coronary Angiography;  Surgeon: Dolores Patty, MD;  Location: [facility] INVASIVE CV LAB;  Service: Cardiovascular;  Laterality: N/A;   CHONDROPLASTY Right 03/20/2017   Procedure: CHONDROPLASTY;  Surgeon: Teryl Lucy, MD;   Location: [facility] OR;  Service: Orthopedics;  Laterality: Right;   COLONOSCOPY  03/22/2005   EYE SURGERY Right    laser surgery, right eye macular degeneration   KNEE ARTHROSCOPY Right    x 2   KNEE ARTHROSCOPY WITH MEDIAL MENISECTOMY Right 03/20/2017   Procedure: KNEE ARTHROSCOPY WITH MEDIAL AND LATERAL MENISECTOMY;  Surgeon: Teryl Lucy, MD;  Location: [facility] OR;  Service: Orthopedics;  Laterality: Right;   NEPHRECTOMY Right    orif fx fibula     sebaceous cyst excision  1985   TONSILLECTOMY     TOTAL SHOULDER ARTHROPLASTY Left 11/11/2014   Procedure: LEFT TOTAL SHOULDER ARTHROPLASTY;  Surgeon: [name], MD;  Location[facility] OR;  Service: Orthopedics;  Laterality: Left;   VASECTOMY      Family History  Problem Relation Age of Onset   Hypertension Mother    Bipolar disorder Mother    Cancer Mother 60       Brain   Other Father        brain tumor   Colon cancer Neg Hx    Colon polyps Neg Hx    Rectal cancer Neg Hx    Stomach cancer Neg Hx    Early death Neg Hx    Hyperlipidemia Neg Hx    Kidney disease Neg Hx     No Known Allergies  Current Outpatient Medications on File Prior to Visit  Medication Sig Dispense Refill   atorvastatin (LIPITOR) 80 MG tablet Take 1 tablet (80 mg total) by mouth daily. 90 tablet 3   carvedilol (COREG) 25 MG tablet Take 1 tablet (25 mg total) by mouth 2 (two) times daily with a meal. 180 tablet 3   cetirizine (ZYRTEC) 10 MG tablet Take 10 mg by mouth daily.     Cholecalciferol 50 MCG (2000 UT) TABS Take 2 tablets (4,000 Units total) by mouth daily. 180 tablet 1   Continuous Blood Gluc Sensor (FREESTYLE LIBRE 14 DAY SENSOR) MISC U UTD     digoxin (LANOXIN) 0.125 MG tablet TAKE 1 TABLET(125 MCG) BY MOUTH DAILY 90 tablet 3   ENTRESTO 97-103 MG TAKE 1 TABLET BY MOUTH TWICE DAILY 180 tablet 3   fluticasone (CUTIVATE) 0.005 % ointment Apply 1 Application topically as needed.     furosemide (LASIX) 40 MG tablet TAKE 1 TABLET( 40 MG TOTAL) BY MOUTH 3 TIMES  A WEEK. MONDAY, WEDNESDAY, FRIDAY- MAY TAKE ADDITIONAL AS NEEDED (Patient taking differently: 40 mg as needed. TAKE 1 TABLET( 40 MG TOTAL) BY MOUTH 3 TIMES A WEEK. MONDAY, WEDNESDAY, FRIDAY- MAY TAKE ADDITIONAL AS NEEDED) 180 tablet 0   Insulin Aspart, w/Niacinamide, (FIASP [place]) Inject 24 Units into the skin. W sliding scale as needed     levocetirizine (XYZAL) 5 MG tablet Take 5 mg by mouth every evening.  levothyroxine (SYNTHROID, LEVOTHROID) 25 MCG tablet TAKE 1 TABLET ONCE DAILY BEFORE BREAKFAST 30 tablet 0   PARoxetine (PAXIL) 30 MG tablet TAKE 1 TABLET(30 MG) BY MOUTH DAILY 30 tablet 0   spironolactone (ALDACTONE) 25 MG tablet TAKE 1/2 TABLET(12.5 MG) BY MOUTH DAILY 45 tablet 3   thiamine (VITAMIN B1) 100 MG tablet TAKE 1 TABLET(100 MG) BY MOUTH EVERY OTHER DAY 45 tablet 1   tirzepatide (MOUNJARO) 15 MG/0.5ML Pen Inject 15 mg into the skin once a week. 2 mL 0   No current facility-administered medications on file prior to visit.    BP 126/72   Pulse 74   Ht 6\' 2"  (1.88 m)   Wt 269 lb 3.2 oz (122.1 kg)   SpO2 96%   BMI 34.56 kg/m        Objective:   Physical Exam  General Mental Status- Alert. General Appearance- Not in acute distress.    Chest and Lung Exam Auscultation: Breath Sounds:-Normal.  Cardiovascular Auscultation:Rythm- Regular. Murmurs & Other Heart Sounds:Auscultation of the heart reveals- No Murmurs.  Abdomen Inspection:-Inspeection Normal. Palpation/Percussion:Note:No mass. Palpation and Percussion of the abdomen reveal- Non Tender over suprapubic area, Non Distended + BS, no rebound or guarding.    Neurologic Cranial Nerve exam:- CN III-XII intact(No nystagmus), symmetric smile. Strength:- 5/5 equal and symmetric strength both upper and lower extremities.   Back - no cva tenderness.    Assessment & Plan:   Assessment and Plan    Urinary Tract Infection New onset dysuria and urinary frequency. Urinalysis shows leukocytes and blood, suggestive  of infection. No history of UTIs or prostatitis. -Administer Rocephin 1g today. -Start Bactrim pending urine culture results. -Encourage hydration.  Diabetes Mellitus Recent blood glucose of 262. Patient reports variable control due to work demands. -Advise close monitoring of blood glucose levels, especially in the setting of infection. -continue insulin and mounjaro  Prostate Health PSA was elevated to 6.44 in November 2022, but has since been stable around 1.1-1.3. No history of prostatitis. -Order PSA and metabolic panel to assess prostate and kidney function.  Renal Function History of nephrectomy. Last creatinine was 1.08 and GFR >60 in February 2024. -Check metabolic panel to assess current kidney function.   Estimate follow up  10-14 days with pcp maybe sooner depending on lab results and how you do with treatment.       Esperanza Richters, PA-C

## 2023-01-10 NOTE — Patient Instructions (Signed)
Urinary Tract Infection New onset dysuria and urinary frequency. Urinalysis shows leukocytes and blood, suggestive of infection. No history of UTIs or prostatitis. -Administer Rocephin 1g today. -Start Bactrim pending urine culture results. -Encourage hydration.  Diabetes Mellitus Recent blood glucose of 262. Patient reports variable control due to work demands. -Advise close monitoring of blood glucose levels, especially in the setting of infection. -continue insulin and mounjaro  Prostate Health PSA was elevated to 6.44 in November 2022, but has since been stable around 1.1-1.3. No history of prostatitis. -Order PSA and metabolic panel to assess prostate and kidney function.  Renal Function History of nephrectomy. Last creatinine was 1.08 and GFR >60 in February 2024. -Check metabolic panel to assess current kidney function.   Estimate follow up  10-14 days with pcp maybe sooner depending on lab results and how you do with treatment.

## 2023-01-11 LAB — COMPREHENSIVE METABOLIC PANEL
AG Ratio: 1.6 (calc) (ref 1.0–2.5)
ALT: 9 U/L (ref 9–46)
AST: 14 U/L (ref 10–35)
Albumin: 4.1 g/dL (ref 3.6–5.1)
Alkaline phosphatase (APISO): 72 U/L (ref 35–144)
BUN: 22 mg/dL (ref 7–25)
CO2: 26 mmol/L (ref 20–32)
Calcium: 9.5 mg/dL (ref 8.6–10.3)
Chloride: 101 mmol/L (ref 98–110)
Creat: 1.12 mg/dL (ref 0.70–1.35)
Globulin: 2.6 g/dL (ref 1.9–3.7)
Glucose, Bld: 323 mg/dL — ABNORMAL HIGH (ref 65–99)
Potassium: 4.3 mmol/L (ref 3.5–5.3)
Sodium: 138 mmol/L (ref 135–146)
Total Bilirubin: 0.7 mg/dL (ref 0.2–1.2)
Total Protein: 6.7 g/dL (ref 6.1–8.1)

## 2023-01-11 LAB — PSA: PSA: 1.44 ng/mL (ref <=4.00)

## 2023-01-12 LAB — URINE CULTURE
MICRO NUMBER:: 15341998
SPECIMEN QUALITY:: ADEQUATE

## 2023-01-13 ENCOUNTER — Other Ambulatory Visit: Payer: Self-pay | Admitting: Internal Medicine

## 2023-01-14 ENCOUNTER — Other Ambulatory Visit (HOSPITAL_COMMUNITY): Payer: Self-pay | Admitting: Internal Medicine

## 2023-01-15 ENCOUNTER — Ambulatory Visit: Payer: BC Managed Care – PPO | Admitting: Internal Medicine

## 2023-01-15 VITALS — BP 128/72 | HR 77 | Temp 97.9°F | Resp 16 | Ht 74.0 in | Wt 267.0 lb

## 2023-01-15 DIAGNOSIS — I110 Hypertensive heart disease with heart failure: Secondary | ICD-10-CM | POA: Diagnosis not present

## 2023-01-15 DIAGNOSIS — Z794 Long term (current) use of insulin: Secondary | ICD-10-CM | POA: Diagnosis not present

## 2023-01-15 DIAGNOSIS — E1165 Type 2 diabetes mellitus with hyperglycemia: Secondary | ICD-10-CM | POA: Diagnosis not present

## 2023-01-15 DIAGNOSIS — E118 Type 2 diabetes mellitus with unspecified complications: Secondary | ICD-10-CM

## 2023-01-15 DIAGNOSIS — Z23 Encounter for immunization: Secondary | ICD-10-CM

## 2023-01-15 DIAGNOSIS — B952 Enterococcus as the cause of diseases classified elsewhere: Secondary | ICD-10-CM

## 2023-01-15 DIAGNOSIS — E039 Hypothyroidism, unspecified: Secondary | ICD-10-CM | POA: Diagnosis not present

## 2023-01-15 DIAGNOSIS — Z0001 Encounter for general adult medical examination with abnormal findings: Secondary | ICD-10-CM

## 2023-01-15 DIAGNOSIS — N390 Urinary tract infection, site not specified: Secondary | ICD-10-CM

## 2023-01-15 DIAGNOSIS — E349 Endocrine disorder, unspecified: Secondary | ICD-10-CM | POA: Diagnosis not present

## 2023-01-15 DIAGNOSIS — G63 Polyneuropathy in diseases classified elsewhere: Secondary | ICD-10-CM

## 2023-01-15 NOTE — Progress Notes (Unsigned)
Subjective:  Patient ID: Gregory [name], male    DOB: 03/20/1961  Age: 62 y.o. MRN: [id number]  CC: Annual Exam   HPI Gregory [name] presents for a CPX and f/up ---  Discussed the use of AI scribe software for clinical note transcription with the patient, who gave verbal consent to proceed.  History of Present Illness   The patient, with a history of diabetes, genitourinary cancer, and cardiac issues, recently experienced a urinary tract infection (UTI). He sought treatment at a local branch where he received Bactrim and an intramuscular injection. The UTI symptoms, including painful and frequent urination, resolved promptly. However, blood work taken at the time revealed an elevated marker, possibly glucose, which the patient was unable to recall precisely.  The patient's diabetes is managed with Gregory [name], and Gregory [name], which is taken at night to control blood sugar levels. He reports no excessive thirst or urination. He is under the care of an endocrinologist, Dr. Sharl [name], who monitors his condition closely.  The patient's last colonoscopy was seven years ago, prior to his cancer diagnosis. The procedure was reportedly normal with no polyps found. He also has regular eye exams, with the most recent one likely in January of the current year.  In terms of cardiac health, the patient is due for a cardiac MRI at the end of September. He reports no worsening of symptoms, no windedness, and no other symptoms related to his cardiac condition. He is on carvedilol and Entresto for his heart condition.  The patient also has a history of genitourinary cancer, for which he continues to have regular check-ups and scans. No new issues have been reported in this regard. His most recent PSA was 1.4, which was taken during the UTI episode.  The patient's overall health appears stable, with no new symptoms or issues reported. He is under the care of multiple specialists who keep a close eye on his various  conditions.       Outpatient Medications Prior to Visit  Medication Sig Dispense Refill   atorvastatin (LIPITOR) 80 MG tablet Take 1 tablet (80 mg total) by mouth daily. 90 tablet 3   carvedilol (COREG) 25 MG tablet TAKE 1 TABLET(25 MG) BY MOUTH TWICE DAILY WITH A MEAL 180 tablet 0   cetirizine (ZYRTEC) 10 MG tablet Take 10 mg by mouth daily.     Cholecalciferol 50 MCG (2000 UT) TABS Take 2 tablets (4,000 Units total) by mouth daily. 180 tablet 1   Continuous Blood Gluc Sensor (FREESTYLE LIBRE 14 DAY SENSOR) MISC U UTD     digoxin (LANOXIN) 0.125 MG tablet TAKE 1 TABLET(125 MCG) BY MOUTH DAILY 90 tablet 3   ENTRESTO 97-103 MG TAKE 1 TABLET BY MOUTH TWICE DAILY 180 tablet 3   fluticasone (CUTIVATE) 0.005 % ointment Apply 1 Application topically as needed.     furosemide (LASIX) 40 MG tablet TAKE 1 TABLET( 40 MG TOTAL) BY MOUTH 3 TIMES A WEEK. MONDAY, WEDNESDAY, FRIDAY- MAY TAKE ADDITIONAL AS NEEDED (Patient taking differently: 40 mg as needed. TAKE 1 TABLET( 40 MG TOTAL) BY MOUTH 3 TIMES A WEEK. MONDAY, WEDNESDAY, FRIDAY- MAY TAKE ADDITIONAL AS NEEDED) 180 tablet 0   Insulin Aspart, w/Niacinamide, (FIASP [place]) Inject 24 Units into the skin. W sliding scale as needed     levocetirizine (XYZAL) 5 MG tablet Take 5 mg by mouth every evening.     levothyroxine (SYNTHROID, LEVOTHROID) 25 MCG tablet TAKE 1 TABLET ONCE DAILY BEFORE BREAKFAST 30 tablet 0  PARoxetine (PAXIL) 30 MG tablet TAKE 1 TABLET(30 MG) BY MOUTH DAILY 30 tablet 0   spironolactone (ALDACTONE) 25 MG tablet TAKE 1/2 TABLET(12.5 MG) BY MOUTH DAILY 45 tablet 3   sulfamethoxazole-trimethoprim (BACTRIM DS) 800-160 MG tablet Take 1 tablet by mouth 2 (two) times daily. 14 tablet 0   thiamine (VITAMIN B1) 100 MG tablet TAKE 1 TABLET(100 MG) BY MOUTH EVERY OTHER DAY 45 tablet 1   tirzepatide (MOUNJARO) 15 MG/0.5ML Pen Inject 15 mg into the skin once a week. 2 mL 0   No facility-administered medications prior to visit.    ROS Review of  Systems  Objective:  BP 128/72 (BP Location: Left Arm, Patient Position: Sitting, Cuff Size: Large)   Pulse 77   Temp 97.9 F (36.6 C) (Oral)   Resp 16   Ht 6\' 2"  (1.88 m)   Wt 267 lb (121.1 kg)   SpO2 93%   BMI 34.28 kg/m   BP Readings from Last 3 Encounters:  01/15/23 128/72  01/10/23 126/72  08/05/22 112/70    Wt Readings from Last 3 Encounters:  01/15/23 267 lb (121.1 kg)  01/10/23 269 lb 3.2 oz (122.1 kg)  08/05/22 274 lb (124.3 kg)    Physical Exam  Lab Results  Component Value Date   WBC 5.7 04/22/2022   HGB 13.9 04/22/2022   HCT 41 04/22/2022   PLT 189 04/22/2022   GLUCOSE 323 (H) 01/10/2023   CHOL 164 10/10/2022   TRIG 254 (A) 10/10/2022   HDL 39 10/10/2022   LDLDIRECT 95.0 04/25/2021   LDLCALC 81 10/10/2022   ALT 9 01/10/2023   AST 14 01/10/2023   NA 138 01/10/2023   K 4.3 01/10/2023   CL 101 01/10/2023   CREATININE 1.12 01/10/2023   BUN 22 01/10/2023   CO2 26 01/10/2023   TSH 1.83 04/22/2022   PSA 1.44 01/10/2023   INR 0.9 10/27/2019   HGBA1C 7.9 10/10/2022   MICROALBUR 3.28 04/22/2022    ECHOCARDIOGRAM COMPLETE  Result Date: 07/10/2022    ECHOCARDIOGRAM REPORT   Patient Name:   Gregory [name] Gregory [name] Date of Exam: 07/10/2022 Medical Rec #:  [id number]     Height:       73.0 in Accession #:    [id number]    Weight:       276.0 lb Date of Birth:  03-17-1961      BSA:          2.467 m Patient Age:    61 years      BP:           124/79 mmHg Patient Gender: M             HR:           72 bpm. Exam Location:  Outpatient Procedure: 2D Echo, Cardiac Doppler and Color Doppler Indications:    Congestive Heart Failure I50.9  History:        Patient has prior history of Echocardiogram examinations, most                 recent 04/18/2021. CHF and Cardiomyopathy; Risk                 Factors:Hypertension, Diabetes and Dyslipidemia. Past history of                 cancer.  Sonographer:    Gregory [name] RDCS Referring Phys: [id number] DANIEL R BENSIMHON IMPRESSIONS  1. Left  ventricular ejection fraction, by estimation, is 20 to  25%. The left ventricle has severely decreased function. The left ventricle demonstrates global hypokinesis. The left ventricular internal cavity size was mildly to moderately dilated. Left ventricular diastolic parameters are consistent with Grade I diastolic dysfunction (impaired relaxation).  2. Right ventricular systolic function is normal. The right ventricular size is normal.  3. Left atrial size was moderately dilated.  4. The mitral valve is grossly normal. Trivial mitral valve regurgitation. No evidence of mitral stenosis.  5. The aortic valve is tricuspid. There is mild calcification of the aortic valve. Aortic valve regurgitation is trivial. No aortic stenosis is present.  6. Aortic dilatation noted. There is borderline dilatation of the aortic root, measuring 38 mm.  7. The inferior vena cava is normal in size with greater than 50% respiratory variability, suggesting right atrial pressure of 3 mmHg. FINDINGS  Left Ventricle: Left ventricular ejection fraction, by estimation, is 20 to 25%. The left ventricle has severely decreased function. The left ventricle demonstrates global hypokinesis. The left ventricular internal cavity size was mildly to moderately dilated. There is no left ventricular hypertrophy. Left ventricular diastolic parameters are consistent with Grade I diastolic dysfunction (impaired relaxation). Right Ventricle: The right ventricular size is normal. Right vetricular wall thickness was not well visualized. Right ventricular systolic function is normal. Left Atrium: Left atrial size was moderately dilated. Right Atrium: Right atrial size was normal in size. Pericardium: Trivial pericardial effusion is present. The pericardial effusion is anterior to the right ventricle. Mitral Valve: The mitral valve is grossly normal. Trivial mitral valve regurgitation. No evidence of mitral valve stenosis. Tricuspid Valve: The tricuspid valve is  grossly normal. Tricuspid valve regurgitation is trivial. No evidence of tricuspid stenosis. Aortic Valve: The aortic valve is tricuspid. There is mild calcification of the aortic valve. Aortic valve regurgitation is trivial. No aortic stenosis is present. Pulmonic Valve: The pulmonic valve was grossly normal. Pulmonic valve regurgitation is trivial. No evidence of pulmonic stenosis. Aorta: Aortic dilatation noted. There is borderline dilatation of the aortic root, measuring 38 mm. Venous: The inferior vena cava is normal in size with greater than 50% respiratory variability, suggesting right atrial pressure of 3 mmHg. IAS/Shunts: No atrial level shunt detected by color flow Doppler.  LEFT VENTRICLE PLAX 2D LVIDd:         6.00 cm LVIDs:         5.70 cm LV PW:         1.00 cm LV IVS:        0.90 cm LVOT diam:     2.60 cm LV SV:         55 LV SV Index:   22 LVOT Area:     5.31 cm  LV Volumes (MOD) LV vol d, MOD A2C: 258.0 ml LV vol d, MOD A4C: 346.0 ml LV vol s, MOD A2C: 214.0 ml LV vol s, MOD A4C: 260.0 ml LV SV MOD A2C:     44.0 ml LV SV MOD A4C:     346.0 ml LV SV MOD BP:      69.1 ml RIGHT VENTRICLE RV S prime:     12.00 cm/s TAPSE (M-mode): 2.0 cm LEFT ATRIUM             Index        RIGHT ATRIUM           Index LA diam:        4.80 cm 1.95 cm/m   RA Area:     12.70 cm LA Vol (  A2C):   56.7 ml 22.98 ml/m  RA Volume:   28.80 ml  11.67 ml/m LA Vol (A4C):   77.3 ml 31.33 ml/m LA Biplane Vol: 69.8 ml 28.29 ml/m  AORTIC VALVE LVOT Vmax:   62.50 cm/s LVOT Vmean:  47.100 cm/s LVOT VTI:    0.104 m  AORTA Ao Root diam: 3.80 cm Ao Asc diam:  3.20 cm  SHUNTS Systemic VTI:  0.10 m Systemic Diam: 2.60 cm Arvilla Meres MD Electronically signed by Arvilla Meres MD Signature Date/Time: 07/10/2022/10:04:11 AM    Final     Assessment & Plan:  Poorly controlled type 2 diabetes mellitus with complication (HCC) -     HM Diabetes Foot Exam  Vitamin B12 deficiency neuropathy (HCC)  Encounter for general adult  medical examination with abnormal findings  Malignant essential hypertension with congestive heart failure (HCC)     Follow-up: No follow-ups on file.  Sanda Linger, MD

## 2023-01-16 ENCOUNTER — Encounter: Payer: Self-pay | Admitting: Internal Medicine

## 2023-01-16 DIAGNOSIS — N390 Urinary tract infection, site not specified: Secondary | ICD-10-CM | POA: Insufficient documentation

## 2023-01-16 DIAGNOSIS — B952 Enterococcus as the cause of diseases classified elsewhere: Secondary | ICD-10-CM | POA: Insufficient documentation

## 2023-01-16 MED ORDER — SHINGRIX 50 MCG/0.5ML IM SUSR: 0.5000 mL | Freq: Once | INTRAMUSCULAR | 1 refills | Status: AC

## 2023-01-21 ENCOUNTER — Other Ambulatory Visit (HOSPITAL_COMMUNITY): Payer: Self-pay | Admitting: Internal Medicine

## 2023-01-23 ENCOUNTER — Other Ambulatory Visit: Payer: Self-pay | Admitting: Internal Medicine

## 2023-01-23 DIAGNOSIS — F418 Other specified anxiety disorders: Secondary | ICD-10-CM

## 2023-02-03 DIAGNOSIS — N480 Leukoplakia of penis: Secondary | ICD-10-CM | POA: Diagnosis not present

## 2023-02-03 DIAGNOSIS — Z85528 Personal history of other malignant neoplasm of kidney: Secondary | ICD-10-CM | POA: Diagnosis not present

## 2023-02-17 ENCOUNTER — Encounter (HOSPITAL_COMMUNITY): Payer: Self-pay

## 2023-02-19 ENCOUNTER — Ambulatory Visit (HOSPITAL_COMMUNITY)
Admission: RE | Admit: 2023-02-19 | Discharge: 2023-02-19 | Disposition: A | Discharge status: Home / Self Care | Payer: BC Managed Care – PPO | Source: Ambulatory Visit | Attending: Internal Medicine | Admitting: Internal Medicine

## 2023-02-19 ENCOUNTER — Other Ambulatory Visit: Payer: Self-pay | Admitting: Internal Medicine

## 2023-02-19 DIAGNOSIS — I5022 Chronic systolic (congestive) heart failure: Secondary | ICD-10-CM

## 2023-03-17 ENCOUNTER — Encounter (HOSPITAL_COMMUNITY): Payer: BC Managed Care – PPO | Admitting: Internal Medicine

## 2023-03-19 ENCOUNTER — Encounter (HOSPITAL_COMMUNITY): Payer: Self-pay

## 2023-03-19 ENCOUNTER — Encounter (HOSPITAL_COMMUNITY): Payer: BC Managed Care – PPO | Admitting: Internal Medicine

## 2023-03-21 ENCOUNTER — Ambulatory Visit: Payer: BC Managed Care – PPO | Attending: Internal Medicine | Admitting: Internal Medicine

## 2023-03-21 VITALS — BP 120/70 | HR 72 | Ht 74.0 in | Wt 271.0 lb

## 2023-03-21 DIAGNOSIS — I2510 Atherosclerotic heart disease of native coronary artery without angina pectoris: Secondary | ICD-10-CM

## 2023-03-21 DIAGNOSIS — I5022 Chronic systolic (congestive) heart failure: Secondary | ICD-10-CM

## 2023-03-21 DIAGNOSIS — I10 Essential (primary) hypertension: Secondary | ICD-10-CM | POA: Diagnosis not present

## 2023-03-21 NOTE — Progress Notes (Unsigned)
Advanced Heart Failure Clinic Note   Date:  03/21/2023   ID:  Gregory [name], DOB 1960-10-06, MRN [id number]  Location: Home  Provider location: [place] [facility] Type of Visit: Established patient  PCP:  Gregory Grandchild, MD  Cardiologist:  None Primary HF: Gregory [name]  Chief Complaint: Heart Failure follow-up   History of Present Illness:  Gregory [name] is a 62 y/o male with h/o obesity, metastatic R renal cell CA s/p R nephrectomy in 9/17 with the subsequent discovery of 2 pulmonary nodules whom we follow for non-ischemic cardiomyopathy.   Developed microscopic hematuria and found to have large right renal mass c/w renal cell CA. Eventually underwent right radical nephrectomy with adrenal sparing on 02/07/16 at [facility]. Post-op developed bradycardia with HRs in 30s while sleeping. BP stable. When awakened had dizziness so given atropine. ECG revealed sinus brady in 40s with mild QT prolongation which resolved. Echo done and EF 45% with global HK. RV dilated  With normal function .   We saw him for the first time in October 2017. At that time we ordered cMRI and stress test. However in the interim he went back to [facility]. Found to have 2 metastatic lung nodules Echo showed EF at 46%. However MUGA with EF 28% so referred back to [place] for further evaluation prior to chemo. Underwent cath 04/09/16 as below. Had chemo and lung nodule resection.    1. 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 Otherwise normal coronaries 3. Nonischemic, dilated cardiomyopathy with EF 30-35% and global hypokinesis   EF 12/21 EF 30% RV normal  Echo 12/13/19 EF 20-25%. RV normal  cMRI 05/29/16 EF 28% No LGE cMRI 2/22 EF 20% RV 20% No LGE    Had sleep study which was negative.    Echo 07/29/19 EF 10-15% Mild RV dysfunction. Underwent aggressive med titration with help of PharmDs  Echo 11/22 EF 25-30% RV ok   He returns  for HF Clinic f/u today. Was at [facility] in 1/24. All scans normal without recurrence. (7 years out). Remains on Mounjaro. HgBa1c down 9 -> 8. Denies CP or SOB. Not exercising. Still working part time. On the road a lot.   Echo 07/10/22: EF 20-25%. RV ok   cMRI 9/24 1.  Moderately dilated LV with diffuse hypokinesis, EF 21%. 2.  Normal RV szie with EF 32%. 3. No myocardial LGE, so no definitive evidence for prior MI, infiltrative disease, or myocarditis. 4. Mildly increased extracellular volume percentage, not in cardiac amyloidosis range.   Echos:     Echo 5/20 EF 35-40% Echo 09/12/16 35-40%, RV ok. - Reviewed personally  Echo 05/22/17: EF 50-55%, grade 1 DD.     Past Medical History:  Diagnosis Date   Allergy    Anemia    [name] iron   Anxiety    Cancer (HCC)    Renal cell cancer   CHF (congestive heart failure) (HCC)    Complex tear of lateral meniscus of right knee as current injury 03/20/2017   Complex tear of medial meniscus of right knee 03/20/2017   Diabetes mellitus without complication (HCC)    History of gastrointestinal hemorrhage    Hypertension    Hypertriglyceridemia    Macular degeneration    lasar surgery- Dr Gregory [name]   Neuropathy    Nonischemic cardiomyopathy (HCC)    EF 25-30% by echo 09/12/16   Plantar warts  PONV (postoperative nausea and vomiting)    " a little bit of nausea"   Primary localized osteoarthritis of right knee 03/20/2017   Renal cell carcinoma [facility])    s/p nephrectomy 02/07/16 at [facility])   Past Surgical History:  Procedure Laterality Date   CARDIAC CATHETERIZATION N/A 04/19/2016   Procedure: Left Heart Cath and Coronary Angiography;  Surgeon: Gregory Patty, MD;  Location: [facility] INVASIVE CV LAB;  Service: Cardiovascular;  Laterality: N/A;   CHONDROPLASTY Right 03/20/2017   Procedure: CHONDROPLASTY;  Surgeon: Gregory Lucy, MD;  Location: [facility] OR;  Service: Orthopedics;  Laterality: Right;   COLONOSCOPY  03/22/2005   EYE  SURGERY Right    laser surgery, right eye macular degeneration   KNEE ARTHROSCOPY Right    x 2   KNEE ARTHROSCOPY WITH MEDIAL MENISECTOMY Right 03/20/2017   Procedure: KNEE ARTHROSCOPY WITH MEDIAL AND LATERAL MENISECTOMY;  Surgeon: Gregory Lucy, MD;  Location: [facility] OR;  Service: Orthopedics;  Laterality: Right;   NEPHRECTOMY Right    orif fx fibula     sebaceous cyst excision  1985   TONSILLECTOMY     TOTAL SHOULDER ARTHROPLASTY Left 11/11/2014   Procedure: LEFT TOTAL SHOULDER ARTHROPLASTY;  Surgeon: Gregory Low, MD;  Location: [facility] OR;  Service: Orthopedics;  Laterality: Left;   VASECTOMY       Current Outpatient Medications  Medication Sig Dispense Refill   atorvastatin (LIPITOR) 80 MG tablet TAKE 1 TABLET(80 MG) BY MOUTH DAILY 90 tablet 1   carvedilol (COREG) 25 MG tablet TAKE 1 TABLET(25 MG) BY MOUTH TWICE DAILY WITH A MEAL 180 tablet 0   cetirizine (ZYRTEC) 10 MG tablet Take 10 mg by mouth daily.     Cholecalciferol 50 MCG (2000 UT) TABS Take 2 tablets (4,000 Units total) by mouth daily. 180 tablet 1   Continuous Blood Gluc Sensor (FREESTYLE LIBRE 14 DAY SENSOR) MISC U UTD     digoxin (LANOXIN) 0.125 MG tablet TAKE 1 TABLET(125 MCG) BY MOUTH DAILY 90 tablet 3   ENTRESTO 97-103 MG TAKE 1 TABLET BY MOUTH TWICE DAILY 180 tablet 3   fluticasone (CUTIVATE) 0.005 % ointment Apply 1 Application topically as needed.     furosemide (LASIX) 40 MG tablet TAKE 1 TABLET( 40 MG TOTAL) BY MOUTH 3 TIMES A WEEK. MONDAY, WEDNESDAY, FRIDAY- MAY TAKE ADDITIONAL AS NEEDED (Patient taking differently: 40 mg as needed. TAKE 1 TABLET( 40 MG TOTAL) BY MOUTH 3 TIMES A WEEK. MONDAY, WEDNESDAY, FRIDAY- MAY TAKE ADDITIONAL AS NEEDED) 180 tablet 0   Insulin Aspart, w/Niacinamide, (FIASP [place]) Inject 24 Units into the skin. W sliding scale as needed     levocetirizine (XYZAL) 5 MG tablet Take 5 mg by mouth every evening.     levothyroxine (SYNTHROID, LEVOTHROID) 25 MCG tablet TAKE 1 TABLET ONCE DAILY BEFORE BREAKFAST  30 tablet 0   PARoxetine (PAXIL) 30 MG tablet TAKE 1 TABLET(30 MG) BY MOUTH DAILY 90 tablet 1   spironolactone (ALDACTONE) 25 MG tablet TAKE 1/2 TABLET(12.5 MG) BY MOUTH DAILY 45 tablet 3   sulfamethoxazole-trimethoprim (BACTRIM DS) 800-160 MG tablet Take 1 tablet by mouth 2 (two) times daily. 14 tablet 0   thiamine (VITAMIN B1) 100 MG tablet TAKE 1 TABLET(100 MG) BY MOUTH EVERY OTHER DAY 45 tablet 1   tirzepatide (MOUNJARO) 15 MG/0.5ML Pen Inject 15 mg into the skin once a week. 2 mL 0   No current facility-administered medications for this visit.    Allergies:   Patient has no known allergies.  Social History:  The patient  reports that he has never smoked. He has never used smokeless tobacco. He reports current alcohol use. He reports that he does not use drugs.   Family History:  The patient's family history includes Bipolar disorder in his mother; Cancer (age of onset: 50) in his mother; Hypertension in his mother; Other in his father.   ROS:  Please see the history of present illness.   All other systems are personally reviewed and negative.   Vitals:   03/21/23 1447  BP: 120/70  Pulse: 72  SpO2: 99%  Weight: 271 lb (122.9 kg)  Height: 6\' 2"  (1.88 m)   Wt Readings from Last 3 Encounters:  03/21/23 271 lb (122.9 kg)  01/15/23 267 lb (121.1 kg)  01/10/23 269 lb 3.2 oz (122.1 kg)   Body mass index is 34.79 kg/m.  Exam:   General:  Well appearing. No resp difficulty HEENT: normal Neck: supple. no JVD. Carotids 2+ bilat; no bruits. No lymphadenopathy or thryomegaly appreciated. Cor: PMI nondisplaced. Regular rate & rhythm. No rubs, gallops or murmurs. Lungs: clear Abdomen: obese soft, nontender, nondistended. No hepatosplenomegaly. No bruits or masses. Good bowel sounds. Extremities: no cyanosis, clubbing, rash, edema Neuro: alert & orientedx3, cranial nerves grossly intact. moves all 4 extremities w/o difficulty. Affect pleasant   NSR 93 IVCD 100 ms Personally  reviewed   Recent Labs: 04/22/2022: Hemoglobin 13.9; Platelets 189; TSH 1.83 07/10/2022: B Natriuretic Peptide 261.6 01/10/2023: ALT 9; BUN 22; Creat 1.12; Potassium 4.3; Sodium 138  Personally reviewed   Wt Readings from Last 3 Encounters:  03/21/23 271 lb (122.9 kg)  01/15/23 267 lb (121.1 kg)  01/10/23 269 lb 3.2 oz (122.1 kg)      ASSESSMENT AND PLAN:  1. Chronic systolic HF due to NICM   - Unclear etiology. EF 30-35% by cath with NICM. cMRI 1/18 with EF 28%.  -  Etiology remains unclear - possible due to immuno-therapy. No evidence of scar of infiltrative process on MRI. Echo 4/18 EF 35-40%. Echo 04/2017 EF 50-55% - Echo 07/29/19 EF 10-15% Mild RV - Echo 12/13/19 EF 20-25%. RV normal.  - Echo 12/21 EF 30% Personally reviewed - cMRI 2/22 EF 20% RV 20% No LGE - Echo  04/18/21 EF 25-30% RV ok   - Echo  07/10/22: EF 20-25%. RV ok  - cMRI 9/24 EF 21% RV EF 32% No LGE - Stable NYHA II - Volume ok  - Continue carvedilol 25 bid - Continue Entresto 97/103 mg bid.  - Continue spiro 12.5 will not increase with previous hyperkalemia - No SGLT2i with Type I DM - Following with Dr. Graciela Husbands (EP) - pending ICD - Will refer for Genetic eval   2. Metastatic Renal Cell CA with lung nodules - s/p nephrectomy.  - [facility]. Felt to be in readmission x 7 years    3. HTN:  -  Blood pressure well controlled. Continue current regimen.  4. CAD - 1v in small RCA.  - No s/s angina  - Continue [name]-dose ASA and statin - Continue atorva 80   5. 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 - Followed by Dr. Sharl Ma - Last A1c ~8  6. Obesity - continue Cecilie Kicks, Arvilla Meres, MD  03/21/2023 3:13 PM  Advanced [facility] Failure Clinic [facility] [address] Heart and Vascular [place] [address] [phone] (office) [phone] (fax)

## 2023-03-25 ENCOUNTER — Encounter: Payer: Self-pay | Admitting: Internal Medicine

## 2023-03-25 ENCOUNTER — Ambulatory Visit: Payer: BC Managed Care – PPO | Attending: Internal Medicine | Admitting: Internal Medicine

## 2023-03-25 VITALS — BP 110/72 | HR 80 | Ht 74.0 in | Wt 273.2 lb

## 2023-03-25 DIAGNOSIS — I5022 Chronic systolic (congestive) heart failure: Secondary | ICD-10-CM | POA: Diagnosis not present

## 2023-03-25 DIAGNOSIS — I428 Other cardiomyopathies: Secondary | ICD-10-CM | POA: Insufficient documentation

## 2023-03-25 MED ORDER — SODIUM CHLORIDE 0.9% FLUSH: 3.0000 mL | Freq: Two times a day (BID) | INTRAVENOUS | Status: DC

## 2023-03-25 NOTE — Patient Instructions (Signed)
Medication Instructions:  Your physician recommends that you continue on your current medications as directed. Please refer to the Current Medication list given to you today.  *If you need a refill on your cardiac medications before your next appointment, please call your pharmacy*   Lab Work: None ordered.  If you have labs (blood work) drawn today and your tests are completely normal, you will receive your results only by: MyChart Message (if you have MyChart) OR A paper copy in the mail If you have any lab test that is abnormal or we need to change your treatment, we will call you to review the results.   Testing/Procedures: Your physician has recommended that you have a defibrillator inserted. An implantable cardioverter defibrillator (ICD) is a small device that is placed in your chest or, in rare cases, your abdomen. This device uses electrical pulses or shocks to help control life-threatening, irregular heartbeats that could lead the heart to suddenly stop beating (sudden cardiac arrest). Leads are attached to the ICD that goes into your heart. This is done in the hospital and usually requires an overnight stay. Please see the instruction sheet given to you today for more information.    Follow-Up: At [facility], you and your health needs are our priority.  As part of our continuing mission to provide you with exceptional heart care, we have created designated Provider Care Teams.  These Care Teams include your primary Cardiologist (physician) and Advanced Practice Providers (APPs -  Physician Assistants and Nurse Practitioners) who all work together to provide you with the care you need, when you need it.  We recommend signing up for the patient portal called "MyChart".  Sign up information is provided on this After Visit Summary.  MyChart is used to connect with patients for Virtual Visits (Telemedicine).  Patients are able to view lab/test results, encounter notes,  upcoming appointments, etc.  Non-urgent messages can be sent to your provider as well.   To learn more about what you can do with MyChart, go to [email or web address].    Your next appointment:   To be scheduled 

## 2023-03-25 NOTE — Progress Notes (Signed)
Patient Care Team: [name], MD as PCP - General (Internal Medicine) Bensimhon, Bevelyn Buckles, MD as Consulting Physician (Cardiology)   HPI  Gregory [name] is a 62 y.o. male seen in follow-up for consideration of an ICD.  Initially seen 3/24 with a recommendation given his young age for ICD implantation for primary prevention.  He desires to discuss it with his wife.  He returns today  Mild symptoms of dyspnea.  No edema. Interval cMRI demonstrated no interval improvement in LV function.  Refer back for ICD implantation discussion   DATE TEST EF    2017 MUGA  28 %    11//17 LHC   % RCA nondominant 90 O/w nonobstructive  12/18 Echo  50-55%    12/21 Echo  30%    2/22 cMRI 20% RV 20%  LGE neg  2/23 Echo  20-25%    2/24 Echo  20-25% RV function normal   9/24 cMRI 21%     Date Cr K Hgb Dig  11.23     13.9    2/24 1.08 4.2       8/24 1.12 4.3  0.5        Records and Results Reviewed   Past Medical History:  Diagnosis Date   Allergy    Anemia    low iron   Anxiety    Cancer (HCC)    Renal cell cancer   CHF (congestive heart failure) (HCC)    Complex tear of lateral meniscus of right knee as current injury 03/20/2017   Complex tear of medial meniscus of right knee 03/20/2017   Diabetes mellitus without complication (HCC)    History of gastrointestinal hemorrhage    Hypertension    Hypertriglyceridemia    Macular degeneration    lasar surgery- Dr [name]   Neuropathy    Nonischemic cardiomyopathy (HCC)    EF 25-30% by echo 09/12/16   Plantar warts    PONV (postoperative nausea and vomiting)    " a little bit of nausea"   Primary localized osteoarthritis of right knee 03/20/2017   Renal cell carcinoma (HCC)    s/p nephrectomy 02/07/16 at [facility])    Past Surgical History:  Procedure Laterality Date   CARDIAC CATHETERIZATION N/A 04/19/2016   Procedure: Left Heart Cath and Coronary Angiography;  Surgeon: Dolores Patty, MD;  Location: [facility]  INVASIVE CV LAB;  Service: Cardiovascular;  Laterality: N/A;   CHONDROPLASTY Right 03/20/2017   Procedure: CHONDROPLASTY;  Surgeon: Teryl Lucy, MD;  Location: [facility] OR;  Service: Orthopedics;  Laterality: Right;   COLONOSCOPY  03/22/2005   EYE SURGERY Right    laser surgery, right eye macular degeneration   KNEE ARTHROSCOPY Right    x 2   KNEE ARTHROSCOPY WITH MEDIAL MENISECTOMY Right 03/20/2017   Procedure: KNEE ARTHROSCOPY WITH MEDIAL AND LATERAL MENISECTOMY;  Surgeon: Teryl Lucy, MD;  Location: [facility] OR;  Service: Orthopedics;  Laterality: Right;   NEPHRECTOMY Right    orif fx fibula     sebaceous cyst excision  1985   TONSILLECTOMY     TOTAL SHOULDER ARTHROPLASTY Left 11/11/2014   Procedure: LEFT TOTAL SHOULDER ARTHROPLASTY;  Surgeon: [name], MD;  Location: [facility] OR;  Service: Orthopedics;  Laterality: Left;   VASECTOMY      Current Meds  Medication Sig   atorvastatin (LIPITOR) 80 MG tablet TAKE 1 TABLET(80 MG) BY MOUTH DAILY   carvedilol (COREG) 25 MG tablet TAKE 1 TABLET(25 MG) BY  MOUTH TWICE DAILY WITH A MEAL   cetirizine (ZYRTEC) 10 MG tablet Take 10 mg by mouth daily.   Cholecalciferol 50 MCG (2000 UT) TABS Take 2 tablets (4,000 Units total) by mouth daily.   Continuous Blood Gluc Sensor (FREESTYLE LIBRE 14 DAY SENSOR) MISC U UTD   digoxin (LANOXIN) 0.125 MG tablet TAKE 1 TABLET(125 MCG) BY MOUTH DAILY   ENTRESTO 97-103 MG TAKE 1 TABLET BY MOUTH TWICE DAILY   fluticasone (CUTIVATE) 0.005 % ointment Apply 1 Application topically as needed.   furosemide (LASIX) 40 MG tablet TAKE 1 TABLET( 40 MG TOTAL) BY MOUTH 3 TIMES A WEEK. MONDAY, WEDNESDAY, FRIDAY- MAY TAKE ADDITIONAL AS NEEDED (Patient taking differently: 40 mg as needed. TAKE 1 TABLET( 40 MG TOTAL) BY MOUTH 3 TIMES A WEEK. MONDAY, WEDNESDAY, FRIDAY- MAY TAKE ADDITIONAL AS NEEDED)   Insulin Aspart, w/Niacinamide, (FIASP [place]) Inject 24 Units into the skin. W sliding scale as needed   levocetirizine (XYZAL) 5 MG tablet  Take 5 mg by mouth every evening.   levothyroxine (SYNTHROID, LEVOTHROID) 25 MCG tablet TAKE 1 TABLET ONCE DAILY BEFORE BREAKFAST   metFORMIN (GLUCOPHAGE-XR) 500 MG 24 hr tablet Take 500 mg by mouth daily.   PARoxetine (PAXIL) 30 MG tablet TAKE 1 TABLET(30 MG) BY MOUTH DAILY   spironolactone (ALDACTONE) 25 MG tablet TAKE 1/2 TABLET(12.5 MG) BY MOUTH DAILY   sulfamethoxazole-trimethoprim (BACTRIM DS) 800-160 MG tablet Take 1 tablet by mouth 2 (two) times daily.   thiamine (VITAMIN B1) 100 MG tablet TAKE 1 TABLET(100 MG) BY MOUTH EVERY OTHER DAY   tirzepatide (MOUNJARO) 15 MG/0.5ML Pen Inject 15 mg into the skin once a week.   TRESIBA FLEXTOUCH 100 UNIT/ML FlexTouch Pen Inject 10 Units into the skin daily.    No Known Allergies    Review of Systems negative except from HPI and PMH  Physical Exam BP 110/72   Pulse 80   Ht 6\' 2"  (1.88 m)   Wt 273 lb 3.2 oz (123.9 kg)   SpO2 95%   BMI 35.08 kg/m  Well developed and well nourished in no acute distress HENT normal E scleral and icterus clear Neck Supple JVP flat; carotids brisk and full Clear to ausculation Regular rate and rhythm, no murmurs gallops or rub Soft with active bowel sounds No clubbing cyanosis  Edema Alert and oriented, grossly normal motor and sensory function Skin Warm and Dry  ECG sinus at 80 Intervals 19/11/38 Poor R wave progression  CrCl cannot be calculated (Patient's most recent lab result is older than the maximum 21 days allowed.).   Assessment and  Plan Nonischemic cardiomyopathy question mechanism   Congestive heart failure class II   Morbid obesity  S/p renal  cell carcinoma resection and immunotherapy   The patient has persistent left ventricular dysfunction despite optimal guideline directed medical therapy.  Given his young age, it is reasonable to proceed with ICD implantation for reducing the potential risk of sudden cardiac death.  Have reviewed the potential benefits and risks of ICD  implantation including but not limited to death, perforation of heart or lung, lead dislodgement, infection,  device malfunction and inappropriate shocks.  The patient expresses understanding  and is willing to proceed.      Current medicines are reviewed at length with the patient today .  The patient does not have concerns regarding medicines.

## 2023-04-17 DIAGNOSIS — E1165 Type 2 diabetes mellitus with hyperglycemia: Secondary | ICD-10-CM | POA: Diagnosis not present

## 2023-04-17 DIAGNOSIS — E349 Endocrine disorder, unspecified: Secondary | ICD-10-CM | POA: Diagnosis not present

## 2023-04-17 DIAGNOSIS — Z794 Long term (current) use of insulin: Secondary | ICD-10-CM | POA: Diagnosis not present

## 2023-04-17 DIAGNOSIS — E039 Hypothyroidism, unspecified: Secondary | ICD-10-CM | POA: Diagnosis not present

## 2023-04-19 ENCOUNTER — Other Ambulatory Visit (HOSPITAL_COMMUNITY): Payer: Self-pay | Admitting: Internal Medicine

## 2023-04-21 ENCOUNTER — Other Ambulatory Visit: Payer: Self-pay | Admitting: Internal Medicine

## 2023-04-28 ENCOUNTER — Other Ambulatory Visit: Payer: Self-pay | Admitting: Internal Medicine

## 2023-04-28 DIAGNOSIS — I5022 Chronic systolic (congestive) heart failure: Secondary | ICD-10-CM

## 2023-04-30 ENCOUNTER — Other Ambulatory Visit: Payer: Self-pay

## 2023-04-30 DIAGNOSIS — Z01812 Encounter for preprocedural laboratory examination: Secondary | ICD-10-CM

## 2023-04-30 LAB — CBC
Hematocrit: 42.8 % (ref 37.5–51.0)
Hemoglobin: 14.5 g/dL (ref 13.0–17.7)
MCH: 29.8 pg (ref 26.6–33.0)
MCHC: 33.9 g/dL (ref 31.5–35.7)
MCV: 88 fL (ref 79–97)
Platelets: 189 10*3/uL (ref 150–450)
RBC: 4.86 x10E6/uL (ref 4.14–5.80)
RDW: 13.8 % (ref 11.6–15.4)
WBC: 5.3 10*3/uL (ref 3.4–10.8)

## 2023-04-30 LAB — BASIC METABOLIC PANEL
BUN/Creatinine Ratio: 18 (ref 10–24)
BUN: 22 mg/dL (ref 8–27)
CO2: 31 mmol/L — ABNORMAL HIGH (ref 20–29)
Calcium: 10.5 mg/dL — ABNORMAL HIGH (ref 8.6–10.2)
Chloride: 100 mmol/L (ref 96–106)
Creatinine, Ser: 1.21 mg/dL (ref 0.76–1.27)
Glucose: 118 mg/dL — ABNORMAL HIGH (ref 70–99)
Potassium: 4.5 mmol/L (ref 3.5–5.2)
Sodium: 138 mmol/L (ref 134–144)
eGFR: 67 mL/min/{1.73_m2} (ref >59)

## 2023-05-02 NOTE — Pre-Procedure Instructions (Signed)
Instructed patient on the following items: Arrival time 0530 Nothing to eat or drink after midnight No meds AM of procedure Responsible person to drive you home and stay with you for 24 hrs Wash with special soap night before and morning of procedure  

## 2023-05-05 ENCOUNTER — Ambulatory Visit (HOSPITAL_COMMUNITY)
Admission: RE | Admit: 2023-05-05 | Discharge: 2023-05-05 | Disposition: A | Discharge status: Home / Self Care | Payer: BC Managed Care – PPO | Attending: Internal Medicine | Admitting: Internal Medicine

## 2023-05-05 ENCOUNTER — Other Ambulatory Visit: Payer: Self-pay

## 2023-05-05 ENCOUNTER — Ambulatory Visit (HOSPITAL_COMMUNITY): Payer: BC Managed Care – PPO

## 2023-05-05 ENCOUNTER — Encounter (HOSPITAL_COMMUNITY)
Admission: RE | Disposition: A | Discharge status: Home / Self Care | Payer: Self-pay | Source: Home / Self Care | Attending: Internal Medicine

## 2023-05-05 DIAGNOSIS — I509 Heart failure, unspecified: Secondary | ICD-10-CM | POA: Diagnosis not present

## 2023-05-05 DIAGNOSIS — I428 Other cardiomyopathies: Secondary | ICD-10-CM | POA: Diagnosis not present

## 2023-05-05 DIAGNOSIS — Z96612 Presence of left artificial shoulder joint: Secondary | ICD-10-CM | POA: Diagnosis not present

## 2023-05-05 DIAGNOSIS — Z8249 Family history of ischemic heart disease and other diseases of the circulatory system: Secondary | ICD-10-CM | POA: Diagnosis not present

## 2023-05-05 DIAGNOSIS — Z85528 Personal history of other malignant neoplasm of kidney: Secondary | ICD-10-CM | POA: Insufficient documentation

## 2023-05-05 DIAGNOSIS — Z6834 Body mass index (BMI) 34.0-34.9, adult: Secondary | ICD-10-CM | POA: Insufficient documentation

## 2023-05-05 DIAGNOSIS — I110 Hypertensive heart disease with heart failure: Secondary | ICD-10-CM | POA: Diagnosis not present

## 2023-05-05 DIAGNOSIS — Z471 Aftercare following joint replacement surgery: Secondary | ICD-10-CM | POA: Diagnosis not present

## 2023-05-05 HISTORY — PX: ICD IMPLANT: EP1208

## 2023-05-05 LAB — GLUCOSE, CAPILLARY: Glucose-Capillary: 191 mg/dL — ABNORMAL HIGH (ref 70–99)

## 2023-05-05 SURGERY — ICD IMPLANT

## 2023-05-05 MED ORDER — CHLORHEXIDINE GLUCONATE 4 % EX SOLN: 4.0000 | Freq: Once | CUTANEOUS | Status: DC

## 2023-05-05 MED ORDER — MIDAZOLAM HCL 2 MG/2ML IJ SOLN
INTRAMUSCULAR | Status: AC
  Filled 2023-05-05: qty 2

## 2023-05-05 MED ORDER — MIDAZOLAM HCL 5 MG/5ML IJ SOLN
INTRAMUSCULAR | Status: DC | PRN
  Administered 2023-05-05: 1 mg via INTRAVENOUS
  Administered 2023-05-05: 2 mg via INTRAVENOUS
  Administered 2023-05-05 (×3): 1 mg via INTRAVENOUS

## 2023-05-05 MED ORDER — FENTANYL CITRATE (PF) 100 MCG/2ML IJ SOLN
INTRAMUSCULAR | Status: AC
  Filled 2023-05-05: qty 2

## 2023-05-05 MED ORDER — HEPARIN (PORCINE) IN NACL 1000-0.9 UT/500ML-% IV SOLN
INTRAVENOUS | Status: DC | PRN
  Administered 2023-05-05: 500 mL

## 2023-05-05 MED ORDER — LIDOCAINE HCL (PF) 1 % IJ SOLN
INTRAMUSCULAR | Status: AC
  Filled 2023-05-05: qty 60

## 2023-05-05 MED ORDER — SODIUM CHLORIDE 0.9 % IV SOLN: 250.0000 mL | INTRAVENOUS | Status: DC

## 2023-05-05 MED ORDER — CEFAZOLIN IN SODIUM CHLORIDE 3-0.9 GM/100ML-% IV SOLN
3.0000 g | INTRAVENOUS | Status: AC
  Administered 2023-05-05: 3 g via INTRAVENOUS
  Filled 2023-05-05: qty 100

## 2023-05-05 MED ORDER — LIDOCAINE HCL (PF) 1 % IJ SOLN
INTRAMUSCULAR | Status: DC | PRN
  Administered 2023-05-05: 50 mL

## 2023-05-05 MED ORDER — ONDANSETRON HCL 4 MG/2ML IJ SOLN
4.0000 mg | Freq: Four times a day (QID) | INTRAMUSCULAR | Status: DC | PRN
Start: 2023-05-05 — End: 2023-05-05

## 2023-05-05 MED ORDER — FENTANYL CITRATE (PF) 100 MCG/2ML IJ SOLN
INTRAMUSCULAR | Status: DC | PRN
  Administered 2023-05-05 (×2): 25 ug via INTRAVENOUS
  Administered 2023-05-05: 50 ug via INTRAVENOUS
  Administered 2023-05-05 (×2): 25 ug via INTRAVENOUS

## 2023-05-05 MED ORDER — SODIUM CHLORIDE 0.9 % IV SOLN: INTRAVENOUS | Status: DC

## 2023-05-05 MED ORDER — SODIUM CHLORIDE 0.9 % IV SOLN: 80.0000 mg | INTRAVENOUS | Status: AC

## 2023-05-05 MED ORDER — SODIUM CHLORIDE 0.9 % IV SOLN
INTRAVENOUS | Status: AC
  Administered 2023-05-05: 80 mg
  Filled 2023-05-05: qty 2

## 2023-05-05 MED ORDER — SODIUM CHLORIDE 0.9% FLUSH: 3.0000 mL | INTRAVENOUS | Status: DC | PRN

## 2023-05-05 MED ORDER — ACETAMINOPHEN 325 MG PO TABS: 325.0000 mg | ORAL_TABLET | ORAL | Status: DC | PRN

## 2023-05-05 SURGICAL SUPPLY — 6 items
CABLE SURGICAL S-101-97-12 (CABLE) ×2 IMPLANT
ICD COBALT XT VR DVPA2D1 (ICD Generator) IMPLANT
LEAD SPRINT QUAT SEC 6935-65CM (Lead) IMPLANT
PAD DEFIB RADIO PHYSIO CONN (PAD) ×2 IMPLANT
SHEATH 9FR PRELUDE SNAP 13 (SHEATH) IMPLANT
TRAY PACEMAKER INSERTION (PACKS) ×2 IMPLANT

## 2023-05-05 NOTE — H&P (Signed)
Patient Care Team: [name], MD as PCP - General (Internal Medicine) Bensimhon, Bevelyn Buckles, MD as Consulting Physician (Cardiology)   HPI  Gregory [name] is a 62 y.o. male admitted for ICD implantation for primary prevention.      Mild symptoms of dyspnea.  No edema. Interval cMRI demonstrated no interval improvement in LV function.    Minimal dyspnea no edema Palps    DATE TEST EF    2017 MUGA  28 %    11//17 LHC   % RCA nondominant 90 O/w nonobstructive  12/18 Echo  50-55%    12/21 Echo  30%    2/22 cMRI 20% RV 20%  LGE neg  2/23 Echo  20-25%    2/24 Echo  20-25% RV function normal   9/24 cMRI 21%      Date Cr K Hgb Dig  11.23     13.9    2/24 1.08 4.2       8/24 1.12 4.3   0.5    ate Cr K Hgb Dig  11.23     13.9    2/24 1.08 4.2       8/24 1.12 4.3   0.5       Records and Results Reviewed   Past Medical History:  Diagnosis Date   Allergy    Anemia    low iron   Anxiety    Cancer (HCC)    Renal cell cancer   CHF (congestive heart failure) (HCC)    Complex tear of lateral meniscus of right knee as current injury 03/20/2017   Complex tear of medial meniscus of right knee 03/20/2017   Diabetes mellitus without complication (HCC)    History of gastrointestinal hemorrhage    Hypertension    Hypertriglyceridemia    Macular degeneration    lasar surgery- Dr [name]   Neuropathy    Nonischemic cardiomyopathy (HCC)    EF 25-30% by echo 09/12/16   Plantar warts    PONV (postoperative nausea and vomiting)    " a little bit of nausea"   Primary localized osteoarthritis of right knee 03/20/2017   Renal cell carcinoma (HCC)    s/p nephrectomy 02/07/16 at [facility])    Past Surgical History:  Procedure Laterality Date   CARDIAC CATHETERIZATION N/A 04/19/2016   Procedure: Left Heart Cath and Coronary Angiography;  Surgeon: Dolores Patty, MD;  Location: [facility] INVASIVE CV LAB;  Service: Cardiovascular;  Laterality: N/A;   CHONDROPLASTY Right  03/20/2017   Procedure: CHONDROPLASTY;  Surgeon: Teryl Lucy, MD;  Location: [facility] OR;  Service: Orthopedics;  Laterality: Right;   COLONOSCOPY  03/22/2005   EYE SURGERY Right    laser surgery, right eye macular degeneration   KNEE ARTHROSCOPY Right    x 2   KNEE ARTHROSCOPY WITH MEDIAL MENISECTOMY Right 03/20/2017   Procedure: KNEE ARTHROSCOPY WITH MEDIAL AND LATERAL MENISECTOMY;  Surgeon: Teryl Lucy, MD;  Location: [facility] OR;  Service: Orthopedics;  Laterality: Right;   NEPHRECTOMY Right    orif fx fibula     sebaceous cyst excision  1985   TONSILLECTOMY     TOTAL SHOULDER ARTHROPLASTY Left 11/11/2014   Procedure: LEFT TOTAL SHOULDER ARTHROPLASTY;  Surgeon: [name], MD;  Location: [facility] OR;  Service: Orthopedics;  Laterality: Left;   VASECTOMY      Current Facility-Administered Medications  Medication Dose Route Frequency Provider Last Rate Last Admin   0.9 %  sodium chloride infusion   Intravenous  Continuous Duke Salvia, MD 100 mL/hr at 05/05/23 0709 New Bag at 05/05/23 0709   0.9 %  sodium chloride infusion  250 mL Intravenous Continuous Duke Salvia, MD       ceFAZolin (ANCEF) IVPB 3g/100 mL premix  3 g Intravenous On Call Duke Salvia, MD       chlorhexidine (HIBICLENS) 4 % liquid 4 Application  4 Application Topical Once Duke Salvia, MD       gentamicin (GARAMYCIN) 80 mg in sodium chloride 0.9 % 500 mL irrigation  80 mg Irrigation On Call Duke Salvia, MD       sodium chloride flush (NS) 0.9 % injection 3 mL  3 mL Intravenous PRN Duke Salvia, MD        No Known Allergies    Social History   Tobacco Use   Smoking status: Never   Smokeless tobacco: Never  Vaping Use   Vaping status: Never Used  Substance Use Topics   Alcohol use: Yes    Alcohol/week: 0.0 standard drinks of alcohol    Comment: social   Drug use: No     Family History  Problem Relation Age of Onset   Hypertension Mother    Bipolar disorder Mother    Cancer Mother 60        Brain   Other Father        brain tumor   Colon cancer Neg Hx    Colon polyps Neg Hx    Rectal cancer Neg Hx    Stomach cancer Neg Hx    Early death Neg Hx    Hyperlipidemia Neg Hx    Kidney disease Neg Hx      Current Facility-Administered Medications for the 05/05/23 encounter [facility] Encounter)  Medication   sodium chloride flush (NS) 0.9 % injection 3 mL   Current Meds  Medication Sig   atorvastatin (LIPITOR) 80 MG tablet TAKE 1 TABLET(80 MG) BY MOUTH DAILY   carvedilol (COREG) 25 MG tablet TAKE 1 TABLET(25 MG) BY MOUTH TWICE DAILY WITH A MEAL   cetirizine (ZYRTEC) 10 MG tablet Take 10 mg by mouth daily.   Cholecalciferol 50 MCG (2000 UT) TABS Take 2 tablets (4,000 Units total) by mouth daily. (Patient taking differently: Take 4,000 Units by mouth 2 (two) times daily.)   Continuous Blood Gluc Sensor (FREESTYLE LIBRE 14 DAY SENSOR) MISC U UTD   digoxin (LANOXIN) 0.125 MG tablet TAKE 1 TABLET(125 MCG) BY MOUTH DAILY   ENTRESTO 97-103 MG TAKE 1 TABLET BY MOUTH TWICE DAILY   fluticasone (CUTIVATE) 0.005 % ointment Apply 1 Application topically as needed.   furosemide (LASIX) 40 MG tablet TAKE 1 TABLET BY MOUTH THREE TIMES A WEEK, ON MONDAY, WEDNESDAY, AND FRIDAY. MAY TAKE ADDITIONAL TABLETS AS NEEDED.   Insulin Aspart, w/Niacinamide, (FIASP [place]) Inject 24-40 Units into the skin 3 (three) times daily before meals. W sliding scale as needed   levothyroxine (SYNTHROID, LEVOTHROID) 25 MCG tablet TAKE 1 TABLET ONCE DAILY BEFORE BREAKFAST   metFORMIN (GLUCOPHAGE-XR) 500 MG 24 hr tablet Take 500 mg by mouth daily.   PARoxetine (PAXIL) 30 MG tablet TAKE 1 TABLET(30 MG) BY MOUTH DAILY   spironolactone (ALDACTONE) 25 MG tablet TAKE 1/2 TABLET(12.5 MG) BY MOUTH DAILY   thiamine (VITAMIN B1) 100 MG tablet TAKE 1 TABLET(100 MG) BY MOUTH EVERY OTHER DAY (Patient taking differently: Take 100 mg by mouth daily.)   tirzepatide (MOUNJARO) 15 MG/0.5ML Pen Inject 15 mg into the skin once  a week.    TRESIBA FLEXTOUCH 100 UNIT/ML FlexTouch Pen Inject 10 Units into the skin at bedtime.     Review of Systems negative except from HPI and PMH  Physical Exam BP 132/82   Pulse 72   Temp 98 F (36.7 C) (Oral)   Resp 16   Ht 6\' 2"  (1.88 m)   Wt 122.5 kg   SpO2 94%   BMI 34.67 kg/m  Well developed and well nourished in no acute distress HENT normal E scleral and icterus clear Neck Supple JVP flat; carotids brisk and full Clear to ausculation Regular rate and rhythm, no murmurs gallops or rub Soft with active bowel sounds No clubbing cyanosis  Edema Alert and oriented, grossly normal motor and sensory function Skin Warm and Dry    Assessment and  Plan Nonischemic cardiomyopathy question mechanism   Congestive heart failure class II   Morbid obesity   S/p renal  cell carcinoma resection and immunotherapy  For ICD implantation  Have reviewed the potential benefits and risks of ICD implantation including but not limited to death, perforation of heart or lung, lead dislodgement, infection,  device malfunction and inappropriate shocks.  The patient and family express understanding  and are willing to proceed.

## 2023-05-05 NOTE — Discharge Instructions (Addendum)
After Your ICD (Implantable Cardiac Defibrillator)   You have a [employer] ICD  ACTIVITY Do not lift your arm above shoulder height for 1 week after your procedure. After 7 days, you may progress as below.  You should remove your sling 24 hours after your procedure, unless otherwise instructed by your provider.     Monday May 12, 2023  Tuesday May 13, 2023 Wednesday May 14, 2023 Thursday May 15, 2023   Do not lift, push, pull, or carry anything over 10 pounds with the affected arm until 6 weeks (Monday June 16, 2023 ) after your procedure.   You may drive AFTER your wound check, unless you have been told otherwise by your provider.   Ask your healthcare provider when you can go back to work   INCISION/Dressing If you are on a blood thinner such as Coumadin, Xarelto, Eliquis, Plavix, or Pradaxa please confirm with your provider when this should be resumed.   If large square, outer bandage is left in place, this can be removed after 24 hours from your procedure. Do not remove steri-strips or glue as below.   Monitor your defibrillator site for redness, swelling, and drainage. Call the device clinic at [phone] if you experience these symptoms or fever/chills.  If your incision is closed with Dermabond/Surgical glue. You may shower 1 day after your pacemaker implant and wash around the site with soap and water.    If you were discharged in a sling, please do not wear this during the day more than 48 hours after your surgery unless otherwise instructed. This may increase the risk of stiffness and soreness in your shoulder.   Avoid lotions, ointments, or perfumes over your incision until it is well-healed.  You may use a hot tub or a pool AFTER your wound check appointment if the incision is completely closed.  Your ICD is designed to protect you from life threatening heart rhythms. Because of this, you may receive a shock.   1 shock with no symptoms:  Call  the office during business hours. 1 shock with symptoms (chest pain, chest pressure, dizziness, lightheadedness, shortness of breath, overall feeling unwell):  Call 911. If you experience 2 or more shocks in 24 hours:  Call 911. If you receive a shock, you should not drive for 6 months per the [place] DMV IF you receive appropriate therapy from your ICD.   ICD Alerts:  Some alerts are vibratory and others beep. These are NOT emergencies. Please call our office to let us know. If this occurs at night or on weekends, it can wait until the next business day. Send a remote transmission.  If your device is capable of reading fluid status (for heart failure), you will be offered monthly monitoring to review this with you.   DEVICE MANAGEMENT Remote monitoring is used to monitor your ICD from home. This monitoring is scheduled every 91 days by our office. It allows [place] to keep an eye on the functioning of your device to ensure it is working properly. You will routinely see your Electrophysiologist annually (more often if necessary).   You should receive your ID card for your new device in 4-8 weeks. Keep this card with you at all times once received. Consider wearing a medical alert bracelet or necklace.  Your ICD  may be MRI compatible. This will be discussed at your next office visit/wound check.  You should avoid contact with strong electric or magnetic fields.   Do not use amateur (ham)  radio equipment or electric (arc) Optician, dispensing. MP3 player headphones with magnets should not be used. Some devices are safe to use if held at least 12 inches (30 cm) from your defibrillator. These include power tools, lawn mowers, and speakers. If you are unsure if something is safe to use, ask your health care provider.  When using your cell phone, hold it to the ear that is on the opposite side from the defibrillator. Do not leave your cell phone in a pocket over the defibrillator.  You may safely use electric  blankets, heating pads, computers, and microwave ovens.  Call the office right away if: You have chest pain. You feel more than one shock. You feel more short of breath than you have felt before. You feel more light-headed than you have felt before. Your incision starts to open up.  This information is not intended to replace advice given to you by your health care provider. Make sure you discuss any questions you have with your health care provider.

## 2023-05-05 NOTE — Progress Notes (Signed)
Report to Kayla RN

## 2023-05-06 ENCOUNTER — Encounter (HOSPITAL_COMMUNITY): Payer: Self-pay | Admitting: Internal Medicine

## 2023-05-06 MED FILL — Midazolam HCl Inj 2 MG/2ML (Base Equivalent): INTRAMUSCULAR | Qty: 6 | Status: AC

## 2023-05-07 ENCOUNTER — Telehealth: Payer: Self-pay

## 2023-05-07 NOTE — Telephone Encounter (Signed)
Follow-up after same day discharge: Implant date: 05/05/2023 MD: Sherryl Manges, MD Device: MDT [place]  Location: L CHEST    Wound check visit: YES 90 day MD follow-up: YES  Remote Transmission received:YES  Dressing/sling removed: YES  Confirm OAC restart on: N/A  Please continue to monitor your cardiac device site for redness, swelling, and drainage. Call the device clinic at [phone] if you experience these symptoms, fever/chills, or have questions about your device.   Remote monitoring is used to monitor your cardiac device from home. This monitoring is scheduled every 91 days by our office. It allows [place] to keep an eye on the functioning of your device to ensure it is working properly.

## 2023-05-11 ENCOUNTER — Telehealth: Payer: Self-pay | Admitting: Physician Assistant

## 2023-05-11 DIAGNOSIS — M7502 Adhesive capsulitis of left shoulder: Secondary | ICD-10-CM

## 2023-05-11 NOTE — Telephone Encounter (Signed)
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 He has followed his instructions post ICD placement (05/05/23). He has also had a shoulder replacement with Dr. Ranell Patrick on that shoulder. Pain started on Thurs. I suspect this is related to a frozen shoulder complication after ICD placement rather than a function of the device. No shortness of breath.   He is taking tylenol. He only has one kidney and does not take NSAIDs. I think that voltaren gel can be used.   I will send a referral to Dr. Ranell Patrick, his prior shoulder surgeon. I will also send a message to Dr. Graciela Husbands. He will keep his wound check appt.

## 2023-05-12 DIAGNOSIS — M25512 Pain in left shoulder: Secondary | ICD-10-CM | POA: Diagnosis not present

## 2023-05-13 ENCOUNTER — Ambulatory Visit: Payer: BC Managed Care – PPO | Attending: Internal Medicine

## 2023-05-13 ENCOUNTER — Telehealth: Payer: Self-pay | Admitting: Internal Medicine

## 2023-05-13 DIAGNOSIS — M25512 Pain in left shoulder: Secondary | ICD-10-CM | POA: Diagnosis not present

## 2023-05-13 DIAGNOSIS — I428 Other cardiomyopathies: Secondary | ICD-10-CM

## 2023-05-13 NOTE — Telephone Encounter (Signed)
Spoke with pt. Reviewed w/ SK. Brought in for eval.

## 2023-05-13 NOTE — Telephone Encounter (Signed)
Pt just had a defib put in and his shoulder has lock up. He is working with the ortho doctor, but wanted to let us know.

## 2023-05-13 NOTE — Progress Notes (Signed)
Pt having shoulder pain. Previous shoulder replacement. Going to get CT of shoulder today. Going to [place] game tomorrow. Reinforced D/C education. SK assessed pt. Confirmed WC appointment.

## 2023-05-19 ENCOUNTER — Other Ambulatory Visit: Payer: Self-pay | Admitting: Physician Assistant

## 2023-05-19 ENCOUNTER — Encounter: Payer: Self-pay | Admitting: Physician Assistant

## 2023-05-19 ENCOUNTER — Telehealth: Payer: Self-pay

## 2023-05-19 ENCOUNTER — Ambulatory Visit
Admission: RE | Admit: 2023-05-19 | Discharge: 2023-05-19 | Disposition: A | Discharge status: Home / Self Care | Payer: BC Managed Care – PPO | Source: Ambulatory Visit | Attending: Physician Assistant | Admitting: Physician Assistant

## 2023-05-19 DIAGNOSIS — Z96612 Presence of left artificial shoulder joint: Secondary | ICD-10-CM | POA: Diagnosis not present

## 2023-05-19 DIAGNOSIS — M25512 Pain in left shoulder: Secondary | ICD-10-CM | POA: Diagnosis not present

## 2023-05-19 NOTE — Telephone Encounter (Signed)
Alert received from [employer] for for AF 12/21 @ 03:09 w/ duration 1 hr 12 min., poor rate control. No hx of PAF noted per EPIC, no OAC.   Patient reports he was sleeping during the event and denies any recent sickness. 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   Compliant with coreg 25 mg BID  Routing to Dr. Graciela Husbands to advise further.

## 2023-05-22 ENCOUNTER — Ambulatory Visit: Payer: BC Managed Care – PPO | Attending: Internal Medicine

## 2023-05-22 DIAGNOSIS — I428 Other cardiomyopathies: Secondary | ICD-10-CM | POA: Diagnosis not present

## 2023-05-22 DIAGNOSIS — I480 Paroxysmal atrial fibrillation: Secondary | ICD-10-CM

## 2023-05-22 LAB — CUP PACEART INCLINIC DEVICE CHECK
Date Time Interrogation Session: 20241226151932
Implantable Lead Connection Status: 753985
Implantable Lead Implant Date: 20241209
Implantable Lead Location: 753860
Implantable Pulse Generator Implant Date: 20241209

## 2023-05-22 MED ORDER — APIXABAN 5 MG PO TABS: 5.0000 mg | ORAL_TABLET | Freq: Two times a day (BID) | ORAL | 2 refills | Status: DC

## 2023-05-22 NOTE — Progress Notes (Signed)
Pt started on eliquis 5mg  BID per GT.   Wound check appointment. Steri-strips removed. Wound without redness or edema. Incision edges approximated, wound well healed. Normal device function. Thresholds, sensing, and impedances consistent with implant measurements. Device programmed at 3.5V for extra safety margin until 3 month visit. Histogram distribution appropriate for patient and level of activity. Two AF episodes w/ RVR noted on previous remote. Eliquis started. No ventricular arrhythmias noted. Patient educated about wound care, arm mobility, lifting restrictions, shock plan. ROV in 3 months with implanting physician.

## 2023-05-22 NOTE — Patient Instructions (Addendum)
    After Your ICD (Implantable Cardiac Defibrillator)    Monitor your defibrillator site for redness, swelling, and drainage. Call the device clinic at [phone] if you experience these symptoms or fever/chills.  Your incision was closed with Dermabond:  You may shower 1 day after your defibrillator implant and wash your incision with soap and water. Avoid lotions, ointments, or perfumes over your incision until it is well-healed.  You may use a hot tub or a pool after your wound check appointment if the incision is completely closed.  Do not lift, push or pull greater than 10 pounds with the affected arm until 6 weeks after your procedure. JANUARY 20, 2025There are no other restrictions in arm movement after your wound check appointment.  Your ICD is designed to protect you from life threatening heart rhythms. Because of this, you may receive a shock.   1 shock with no symptoms:  Call the office during business hours. 1 shock with symptoms (chest pain, chest pressure, dizziness, lightheadedness, shortness of breath, overall feeling unwell):  Call 911. If you experience 2 or more shocks in 24 hours:  Call 911. If you receive a shock, you should not drive.  [place] DMV - no driving for 6 months if you receive appropriate therapy from your ICD.   ICD Alerts:  Some alerts are vibratory and others beep. These are NOT emergencies. Please call our office to let us know. If this occurs at night or on weekends, it can wait until the next business day. Send a remote transmission.  If your device is capable of reading fluid status (for heart failure), you will be offered monthly monitoring to review this with you.   Remote monitoring is used to monitor your ICD from home. This monitoring is scheduled every 91 days by our office. It allows [place] to keep an eye on the functioning of your device to ensure it is working properly. You will routinely see your Electrophysiologist annually (more often if  necessary).

## 2023-05-23 ENCOUNTER — Telehealth: Payer: Self-pay

## 2023-05-23 NOTE — Telephone Encounter (Signed)
Saw patient in clinic

## 2023-05-23 NOTE — Telephone Encounter (Signed)
Please Inform Patient We will follow for now given the brief duration fo the episode Thanks

## 2023-05-23 NOTE — Telephone Encounter (Signed)
Patient evaluated at wound check 05/22/2023 and advised to start Eliquis per GT.

## 2023-05-23 NOTE — Telephone Encounter (Signed)
   Pre-operative Risk Assessment    Patient Name: Gregory [name]  DOB: 1960/07/11 MRN: [id number]   Date of last office visit: 03/25/23 Date of next office visit: 08/13/2023   Request for Surgical Clearance    Procedure:   Left Shoulder Arthroplasty Revision  Date of Surgery:  Clearance TBD                                Surgeon:  Dr. Malon Kindle Surgeon's Group or Practice Name:  [facility] Phone number:  [phone] Fax number:  [phone]   Type of Clearance Requested:   - Medical    Type of Anesthesia:  General    Additional requests/questions:    Garrel Ridgel   05/23/2023, 1:20 PM

## 2023-05-23 NOTE — Telephone Encounter (Signed)
Should be acceptable cardiovascular risk for surgical revision of his shoulder  Thanks SK

## 2023-05-26 NOTE — Telephone Encounter (Signed)
I will forward back to preop as the pt is on Eliquis and will need to hold Eliquis per surgeon's office.

## 2023-05-27 ENCOUNTER — Encounter: Payer: Self-pay | Admitting: Internal Medicine

## 2023-05-27 NOTE — Telephone Encounter (Signed)
Gregory [name]   Fax #: [phone] Phone #: [phone]  Patient's clearance notes sent to surgeons office

## 2023-05-27 NOTE — Telephone Encounter (Signed)
 Patient with diagnosis of atrial fibrillaton on Eliquis  for anticoagulation.    Procedure:   Left Shoulder Arthroplasty Revision   Date of Surgery:  Clearance TBD      CHA2DS2-VASc Score = 3   This indicates a 3.2% annual risk of stroke. The patient's score is based upon: CHF History: 1 HTN History: 1 Diabetes History: 1 Stroke History: 0 Vascular Disease History: 0 Age Score: 0 Gender Score: 0   CrCl 87 (with adjusted body weight) Platelet count 189  Per office protocol, patient can hold Eliquis  for 3 days prior to procedure.   Patient will not need bridging with Lovenox  (enoxaparin ) around procedure.  **This guidance is not considered finalized until pre-operative APP has relayed final recommendations.**

## 2023-05-27 NOTE — Progress Notes (Signed)
 Called and spoke to dr Ranell Patrick He pointed out that he was on Apixaban   started for scaf of 1 and 3 hrs I would stop and have reviewed this with patient and will stop it He notes that he and Sharlot Gowda T are personal friends and will let him know.

## 2023-05-29 NOTE — Progress Notes (Addendum)
 COVID Vaccine Completed:  Yes  Date of COVID positive in last 90 days:  No  PCP - Debby Molt, MD Cardiologist - Marcey Sage, MD/Daniel Bensimhon, MD  Chest x-ray - 05-05-23 Epic EKG - 05-05-23 Epic Stress Test - N/A ECHO - 07-10-22 Epic Cardiac Cath - 04-19-16 Epic Pacemaker/ICD device last checked: 05-22-23 Epic. Device orders in Epic Spinal Cord Stimulator: N/A MR Cardiac - 02-19-23 Epic  Bowel Prep - N/A  Sleep Study - Yes, borderline sleep apnea CPAP - No  Freestlyle Libre currently L arm, will move to R arm prior to surgery Fasting Blood Sugar - 160 to 180 Checks Blood Sugar - 4 to 5 times daily  Mounjaro  Last dose of GLP1 agonist-  05-25-23 GLP1 instructions:  Patient instructed to hold until after surgery   Last dose of SGLT-2 inhibitors-  N/A SGLT-2 instructions:  Hold 3 days before surgery   Blood Thinner Instructions:  Patient states that Eliquis  has been discontinued.  Took last dose on 05-28-23 Aspirin  Instructions: Last Dose:  Activity level:  Can go up a flight of stairs and perform activities of daily living without stopping and without symptoms of chest pain or shortness of breath.  Anesthesia review:   CHF, cardiomyopathy, HTN, DM.  ICD (L chest same side as surgical site).   Rep notified regarding ICD and same side surgery as well as device needing to be programmed.  Patient denies shortness of breath, fever, cough and chest pain at PAT appointment  Patient verbalized understanding of instructions that were given to them at the PAT appointment. Patient was also instructed that they will need to review over the PAT instructions again at home before surgery.

## 2023-05-29 NOTE — Patient Instructions (Addendum)
 SURGICAL WAITING ROOM VISITATION Patients having surgery or a procedure may have no more than 2 support people in the waiting area - these visitors may rotate.    Children under the age of 52 must have an adult with them who is not the patient.  If the patient needs to stay at the hospital during part of their recovery, the visitor guidelines for inpatient rooms apply. Pre-op  nurse will coordinate an appropriate time for 1 support person to accompany patient in pre-op .  This support person may not rotate.    Please refer to the [facility] website for the visitor guidelines for Inpatients (after your surgery is over and you are in a regular room).       Your procedure is scheduled on: 06-06-23   Report to [facility] Main Entrance    Report to admitting at 11:15 AM   Call this number if you have problems the morning of surgery [phone]   Do not eat food :After Midnight.   After Midnight you may have the following liquids until 10:40 AM DAY OF SURGERY  Water  Non-Citrus Juices (without pulp, NO RED-Apple, White grape, White cranberry) Black Coffee (NO MILK/CREAM OR CREAMERS, sugar ok)  Clear Tea (NO MILK/CREAM OR CREAMERS, sugar ok) regular and decaf                             Plain Jell-O (NO RED)                                           Fruit ices (not with fruit pulp, NO RED)                                     Popsicles (NO RED)                                                               Sports drinks like Gatorade (NO RED)                   The day of surgery:  Drink ONE (1) Pre-Surgery G2 by 10:40 AM the morning of surgery. Drink in one sitting. Do not sip.  This drink was given to you during your hospital  pre-op  appointment visit. Nothing else to drink after completing the Pre-Surgery G2.          If you have questions, please contact your surgeon's office.   FOLLOW  ANY ADDITIONAL PRE OP INSTRUCTIONS YOU RECEIVED FROM YOUR SURGEON'S OFFICE!!!      Oral Hygiene is also important to reduce your risk of infection.                                    Remember - BRUSH YOUR TEETH THE MORNING OF SURGERY WITH YOUR REGULAR TOOTHPASTE   Do NOT smoke after Midnight   Take these medicines the morning of surgery with A SIP OF WATER :   Atorvastatin   Carvedilol   Zyrtec  Digoxin   Levothyroxine   Paroxetine   Stop all vitamins and herbal supplements 7 days before surgery  How to Manage Your Diabetes Before and After Surgery  Why is it important to control my blood sugar before and after surgery? Improving blood sugar levels before and after surgery helps healing and can limit problems. A way of improving blood sugar control is eating a healthy diet by:  Eating less sugar and carbohydrates  Increasing activity/exercise  Talking with your doctor about reaching your blood sugar goals High blood sugars (greater than 180 mg/dL) can raise your risk of infections and slow your recovery, so you will need to focus on controlling your diabetes during the weeks before surgery. Make sure that the doctor who takes care of your diabetes knows about your planned surgery including the date and location.  How do I manage my blood sugar before surgery? Check your blood sugar at least 4 times a day, starting 2 days before surgery, to make sure that the level is not too high or low. Check your blood sugar the morning of your surgery when you wake up and every 2 hours until you get to the Short Stay unit. If your blood sugar is less than 70 mg/dL, you will need to treat for low blood sugar: Do not take insulin . Treat a low blood sugar (less than 70 mg/dL) with  cup of clear juice (cranberry or apple), 4 glucose tablets, OR glucose gel. Recheck blood sugar in 15 minutes after treatment (to make sure it is greater than 70 mg/dL). If your blood sugar is not greater than 70 mg/dL on recheck, call [phone] for further instructions. Report your blood sugar to the  short stay nurse when you get to Short Stay.  If you are admitted to the hospital after surgery: Your blood sugar will be checked by the staff and you will probably be given insulin  after surgery (instead of oral diabetes medicines) to make sure you have good blood sugar levels. The goal for blood sugar control after surgery is 80-180 mg/dL.   WHAT DO I DO ABOUT MY DIABETES MEDICATION?  Do not take oral diabetes medicines (pills) the morning of surgery.         Hold Mounjaro  7 days before surgery (do not take after 05-29-23)  THE NIGHT BEFORE SURGERY, take 5 units of Tresiba.      THE MORNING OF SURGERY, if CBG 220 or higher take 1/2 dose of Insulin  Aspart (sliding scale)  DO NOT TAKE THE FOLLOWING 7 DAYS PRIOR TO SURGERY: Ozempic , Wegovy, Rybelsus (Semaglutide ), Byetta (exenatide), Bydureon (exenatide ER), Victoza, Saxenda (liraglutide), or Trulicity (dulaglutide) Mounjaro  (Tirzepatide ) Adlyxin (Lixisenatide), Polyethylene Glycol Loxenatide.  Reviewed and Endorsed by [facility] Patient Education Committee, August 2015                              You may not have any metal on your body including  jewelry, and body piercing             Do not wear lotions, powders,  cologne, or deodorant              Men may shave face and neck.   Do not bring valuables to the hospital. [facility] IS NOT RESPONSIBLE   FOR VALUABLES.   Contacts, dentures or bridgework may not be worn into surgery.   Bring small overnight bag day of surgery.   DO NOT BRING YOUR HOME MEDICATIONS TO THE  HOSPITAL. PHARMACY WILL DISPENSE MEDICATIONS LISTED ON YOUR MEDICATION LIST TO YOU DURING YOUR ADMISSION IN THE HOSPITAL!    Special Instructions: Bring a copy of your healthcare power of attorney and living will documents the day of surgery if you haven't scanned them before.              Please read over the following fact sheets you were given: IF YOU HAVE QUESTIONS ABOUT YOUR PRE-OP  INSTRUCTIONS PLEASE CALL  [phone] [facility]   If you received a COVID test during your pre-op  visit  it is requested that you wear a mask when out in public, stay away from anyone that may not be feeling well and notify your surgeon if you develop symptoms. If you test positive for Covid or have been in contact with anyone that has tested positive in the last 10 days please notify you surgeon.   [place]- Preparing for Total Shoulder Arthroplasty    Before surgery, you can play an important role. Because skin is not sterile, your skin needs to be as free of germs as possible. You can reduce the number of germs on your skin by using the following products. Benzoyl Peroxide Gel Reduces the number of germs present on the skin Applied twice a day to shoulder area starting two days before surgery    ==================================================================  Please follow these instructions carefully:  BENZOYL PEROXIDE 5% GEL  Please do not use if you have an allergy to benzoyl peroxide.   If your skin becomes reddened/irritated stop using the benzoyl peroxide.  Starting two days before surgery, apply as follows: Apply benzoyl peroxide in the morning and at night. Apply after taking a shower. If you are not taking a shower clean entire shoulder front, back, and side along with the armpit with a clean wet washcloth.  Place a quarter-sized dollop on your shoulder and rub in thoroughly, making sure to cover the front, back, and side of your shoulder, along with the armpit.   2 days before ____ AM   ____ PM              1 day before ____ AM   ____ PM                         Do this twice a day for two days.  (Last application is the night before surgery, AFTER using the CHG soap as described below).  Do NOT apply benzoyl peroxide gel on the day of surgery.    Pre-operative 5 CHG Bath Instructions   You can play a key role in reducing the risk of infection after surgery. Your skin needs to be as free of  germs as possible. You can reduce the number of germs on your skin by washing with CHG (chlorhexidine  gluconate) soap before surgery. CHG is an antiseptic soap that kills germs and continues to kill germs even after washing.   DO NOT use if you have an allergy to chlorhexidine /CHG or antibacterial soaps. If your skin becomes reddened or irritated, stop using the CHG and notify one of our RNs at [phone].   Please shower with the CHG soap starting 4 days before surgery using the following schedule:     Please keep in mind the following:  DO NOT shave, including legs and underarms, starting the day of your first shower.   You may shave your face at any point before/day of surgery.  Place clean sheets on your bed  the day you start using CHG soap. Use a clean washcloth (not used since being washed) for each shower. DO NOT sleep with pets once you start using the CHG.   CHG Shower Instructions:  If you choose to wash your hair and private area, wash first with your normal shampoo/soap.  After you use shampoo/soap, rinse your hair and body thoroughly to remove shampoo/soap residue.  Turn the water  OFF and apply about 3 tablespoons (45 ml) of CHG soap to a CLEAN washcloth.  Apply CHG soap ONLY FROM YOUR NECK DOWN TO YOUR TOES (washing for 3-5 minutes)  DO NOT use CHG soap on face, private areas, open wounds, or sores.  Pay special attention to the area where your surgery is being performed.  If you are having back surgery, having someone wash your back for you may be helpful. Wait 2 minutes after CHG soap is applied, then you may rinse off the CHG soap.  Pat dry with a clean towel  Put on clean clothes/pajamas   If you choose to wear lotion, please use ONLY the CHG-compatible lotions on the back of this paper.     Additional instructions for the day of surgery: DO NOT APPLY any lotions, deodorants, cologne, or perfumes.   Put on clean/comfortable clothes.  Brush your teeth.  Ask your  nurse before applying any prescription medications to the skin.      CHG Compatible Lotions   Aveeno Moisturizing lotion  Cetaphil Moisturizing Cream  Cetaphil Moisturizing Lotion  Clairol Herbal Essence Moisturizing Lotion, Dry Skin  Clairol Herbal Essence Moisturizing Lotion, Extra Dry Skin  Clairol Herbal Essence Moisturizing Lotion, Normal Skin  Curel Age Defying Therapeutic Moisturizing Lotion with Alpha Hydroxy  Curel Extreme Care Body Lotion  Curel Soothing Hands Moisturizing Hand Lotion  Curel Therapeutic Moisturizing Cream, Fragrance-Free  Curel Therapeutic Moisturizing Lotion, Fragrance-Free  Curel Therapeutic Moisturizing Lotion, Original Formula  Eucerin Daily Replenishing Lotion  Eucerin Dry Skin Therapy Plus Alpha Hydroxy Crme  Eucerin Dry Skin Therapy Plus Alpha Hydroxy Lotion  Eucerin Original Crme  Eucerin Original Lotion  Eucerin Plus Crme Eucerin Plus Lotion  Eucerin TriLipid Replenishing Lotion  Keri Anti-Bacterial Hand Lotion  Keri Deep Conditioning Original Lotion Dry Skin Formula Softly Scented  Keri Deep Conditioning Original Lotion, Fragrance Free Sensitive Skin Formula  Keri Lotion Fast Absorbing Fragrance Free Sensitive Skin Formula  Keri Lotion Fast Absorbing Softly Scented Dry Skin Formula  Keri Original Lotion  Keri Skin Renewal Lotion Keri Silky Smooth Lotion  Keri Silky Smooth Sensitive Skin Lotion  Nivea Body Creamy Conditioning Oil  Nivea Body Extra Enriched Lotion  Nivea Body Original Lotion  Nivea Body Sheer Moisturizing Lotion Nivea Crme  Nivea Skin Firming Lotion  NutraDerm 30 Skin Lotion  NutraDerm Skin Lotion  NutraDerm Therapeutic Skin Cream  NutraDerm Therapeutic Skin Lotion  ProShield Protective Hand Cream  Provon moisturizing lotion   PATIENT SIGNATURE_________________________________  NURSE SIGNATURE__________________________________  ________________________________________________________________________     Nasario Exon  An incentive spirometer is a tool that can help keep your lungs clear and active. This tool measures how well you are filling your lungs with each breath. Taking long deep breaths may help reverse or decrease the chance of developing breathing (pulmonary) problems (especially infection) following: A long period of time when you are unable to move or be active. BEFORE THE PROCEDURE  If the spirometer includes an indicator to show your best effort, your nurse or respiratory therapist will set it to a desired goal. If possible, sit  up straight or lean slightly forward. Try not to slouch. Hold the incentive spirometer in an upright position. INSTRUCTIONS FOR USE  Sit on the edge of your bed if possible, or sit up as far as you can in bed or on a chair. Hold the incentive spirometer in an upright position. Breathe out normally. Place the mouthpiece in your mouth and seal your lips tightly around it. Breathe in slowly and as deeply as possible, raising the piston or the ball toward the top of the column. Hold your breath for 3-5 seconds or for as long as possible. Allow the piston or ball to fall to the bottom of the column. Remove the mouthpiece from your mouth and breathe out normally. Rest for a few seconds and repeat Steps 1 through 7 at least 10 times every 1-2 hours when you are awake. Take your time and take a few normal breaths between deep breaths. The spirometer may include an indicator to show your best effort. Use the indicator as a goal to work toward during each repetition. After each set of 10 deep breaths, practice coughing to be sure your lungs are clear. If you have an incision (the cut made at the time of surgery), support your incision when coughing by placing a pillow or rolled up towels firmly against it. Once you are able to get out of bed, walk around indoors and cough well. You may stop using the incentive spirometer when instructed by your caregiver.   RISKS AND COMPLICATIONS Take your time so you do not get dizzy or light-headed. If you are in pain, you may need to take or ask for pain medication before doing incentive spirometry. It is harder to take a deep breath if you are having pain. AFTER USE Rest and breathe slowly and easily. It can be helpful to keep track of a log of your progress. Your caregiver can provide you with a simple table to help with this. If you are using the spirometer at home, follow these instructions: SEEK MEDICAL CARE IF:  You are having difficultly using the spirometer. You have trouble using the spirometer as often as instructed. Your pain medication is not giving enough relief while using the spirometer. You develop fever of 100.5 F (38.1 C) or higher. SEEK IMMEDIATE MEDICAL CARE IF:  You cough up bloody sputum that had not been present before. You develop fever of 102 F (38.9 C) or greater. You develop worsening pain at or near the incision site. MAKE SURE YOU:  Understand these instructions. Will watch your condition. Will get help right away if you are not doing well or get worse. Document Released: 09/23/2006 Document Revised: 08/05/2011 Document Reviewed: 11/24/2006 Exit[employer] Patient Information 2014 [employer], [place].   ________________________________________________________________________ WHAT IS A BLOOD TRANSFUSION? Blood Transfusion Information  A transfusion is the replacement of blood or some of its parts. Blood is made up of multiple cells which provide different functions. Red blood cells carry oxygen and are used for blood loss replacement. White blood cells fight against infection. Platelets control bleeding. Plasma helps clot blood. Other blood products are available for specialized needs, such as hemophilia or other clotting disorders. BEFORE THE TRANSFUSION  Who gives blood for transfusions?  Healthy volunteers who are fully evaluated to make sure their blood is safe. This is  blood bank blood. Transfusion therapy is the safest it has ever been in the practice of medicine. Before blood is taken from a donor, a complete history is taken to make sure  that person has no history of diseases nor engages in risky social behavior (examples are intravenous drug use or sexual activity with multiple partners). The donor's travel history is screened to minimize risk of transmitting infections, such as malaria. The donated blood is tested for signs of infectious diseases, such as HIV and hepatitis. The blood is then tested to be sure it is compatible with you in order to minimize the chance of a transfusion reaction. If you or a relative donates blood, this is often done in anticipation of surgery and is not appropriate for emergency situations. It takes many days to process the donated blood. RISKS AND COMPLICATIONS Although transfusion therapy is very safe and saves many lives, the main dangers of transfusion include:  Getting an infectious disease. Developing a transfusion reaction. This is an allergic reaction to something in the blood you were given. Every precaution is taken to prevent this. The decision to have a blood transfusion has been considered carefully by your caregiver before blood is given. Blood is not given unless the benefits outweigh the risks. AFTER THE TRANSFUSION Right after receiving a blood transfusion, you will usually feel much better and more energetic. This is especially true if your red blood cells have gotten low (anemic). The transfusion raises the level of the red blood cells which carry oxygen, and this usually causes an energy increase. The nurse administering the transfusion will monitor you carefully for complications. HOME CARE INSTRUCTIONS  No special instructions are needed after a transfusion. You may find your energy is better. Speak with your caregiver about any limitations on activity for underlying diseases you may have. SEEK MEDICAL CARE IF:   Your condition is not improving after your transfusion. You develop redness or irritation at the intravenous (IV) site. SEEK IMMEDIATE MEDICAL CARE IF:  Any of the following symptoms occur over the next 12 hours: Shaking chills. You have a temperature by mouth above 102 F (38.9 C), not controlled by medicine. Chest, back, or muscle pain. People around you feel you are not acting correctly or are confused. Shortness of breath or difficulty breathing. Dizziness and fainting. You get a rash or develop hives. You have a decrease in urine output. Your urine turns a dark color or changes to pink, red, or brown. Any of the following symptoms occur over the next 10 days: You have a temperature by mouth above 102 F (38.9 C), not controlled by medicine. Shortness of breath. Weakness after normal activity. The white part of the eye turns yellow (jaundice). You have a decrease in the amount of urine or are urinating less often. Your urine turns a dark color or changes to pink, red, or brown. Document Released: 05/10/2000 Document Revised: 08/05/2011 Document Reviewed: 12/28/2007 [facility] Patient Information 2014 [place], [place].  _______________________________________________________________________

## 2023-05-29 NOTE — H&P (Signed)
 Patient's anticipated LOS is less than 2 midnights, meeting these requirements: - Younger than 54 - Lives within 1 hour of care - Has a competent adult at home to recover with post-op recover - NO history of  - Chronic pain requiring opiods  - Diabetes  - Coronary Artery Disease  - Heart failure  - Heart attack  - Stroke  - DVT/VTE  - Cardiac arrhythmia  - Respiratory Failure/COPD  - Renal failure  - Anemia  - Advanced Liver disease     ALEXANDRA POSADAS is an 63 y.o. male.    Chief Complaint: left shoulder pain  HPI: Pt is a 64 y.o. male complaining of left pain for multiple years. Pain had continually increased since the beginning. X-rays in the clinic show loose glenoid component s/p reverse total shoulder. Various options are discussed with the patient. Risks, benefits and expectations were discussed with the patient. Patient understand the risks, benefits and expectations and wishes to proceed with surgery.   PCP:  [name], MD  D/C Plans: Home  PMH: Past Medical History:  Diagnosis Date   Allergy    Anemia    low iron   Anxiety    Cancer (HCC)    Renal cell cancer   CHF (congestive heart failure) (HCC)    Complex tear of lateral meniscus of right knee as current injury 03/20/2017   Complex tear of medial meniscus of right knee 03/20/2017   Diabetes mellitus without complication (HCC)    History of gastrointestinal hemorrhage    Hypertension    Hypertriglyceridemia    Macular degeneration    lasar surgery- Dr Alvia   Neuropathy    Nonischemic cardiomyopathy (HCC)    EF 25-30% by echo 09/12/16   Plantar warts    PONV (postoperative nausea and vomiting)     a little bit of nausea   Primary localized osteoarthritis of right knee 03/20/2017   Renal cell carcinoma (HCC)    s/p nephrectomy 02/07/16 at [facility])    PSH: Past Surgical History:  Procedure Laterality Date   CARDIAC CATHETERIZATION N/A 04/19/2016   Procedure: Left Heart Cath and  Coronary Angiography;  Surgeon: Toribio JONELLE Fuel, MD;  Location[facility] INVASIVE CV LAB;  Service: Cardiovascular;  Laterality: N/A;   CHONDROPLASTY Right 03/20/2017   Procedure: CHONDROPLASTY;  Surgeon: Josefina Chew, MD;  Location: [facility] OR;  Service: Orthopedics;  Laterality: Right;   COLONOSCOPY  03/22/2005   EYE SURGERY Right    laser surgery, right eye macular degeneration   ICD IMPLANT N/A 05/05/2023   Procedure: ICD IMPLANT;  Surgeon: Fernande Elspeth BROCKS, MD;  Location: [facility] INVASIVE CV LAB;  Service: Cardiovascular;  Laterality: N/A;   KNEE ARTHROSCOPY Right    x 2   KNEE ARTHROSCOPY WITH MEDIAL MENISECTOMY Right 03/20/2017   Procedure: KNEE ARTHROSCOPY WITH MEDIAL AND LATERAL MENISECTOMY;  Surgeon: Josefina Chew, MD;  Location: [facility] OR;  Service: Orthopedics;  Laterality: Right;   NEPHRECTOMY Right    orif fx fibula     sebaceous cyst excision  1985   TONSILLECTOMY     TOTAL SHOULDER ARTHROPLASTY Left 11/11/2014   Procedure: LEFT TOTAL SHOULDER ARTHROPLASTY;  Surgeon: Marcey Her, MD;  Location: [facility] OR;  Service: Orthopedics;  Laterality: Left;   VASECTOMY      Social History:  reports that he has never smoked. He has never used smokeless tobacco. He reports current alcohol use. He reports that he does not use drugs. BMI: Estimated body mass index is  34.67 kg/m as calculated from the following:   Height as of 05/05/23: 6' 2 (1.88 m).   Weight as of 05/05/23: 122.5 kg.  Lab Results  Component Value Date   ALBUMIN 4.3 04/25/2021   Diabetes:   Patient has a diagnosis of diabetes,  Lab Results  Component Value Date   HGBA1C 7.9 10/10/2022   Smoking Status:   reports that he has never smoked. He has never used smokeless tobacco.    Allergies:  No Known Allergies  Medications: Current Facility-Administered Medications  Medication Dose Route Frequency Provider Last Rate Last Admin   sodium chloride  flush (NS) 0.9 % injection 3 mL  3 mL Intravenous Q12H Fernande Elspeth BROCKS, MD        Current Outpatient Medications  Medication Sig Dispense Refill   apixaban  (ELIQUIS ) 5 MG TABS tablet Take 1 tablet (5 mg total) by mouth 2 (two) times daily. 60 tablet 2   atorvastatin  (LIPITOR ) 80 MG tablet TAKE 1 TABLET(80 MG) BY MOUTH DAILY 90 tablet 1   carvedilol  (COREG ) 25 MG tablet TAKE 1 TABLET(25 MG) BY MOUTH TWICE DAILY WITH A MEAL 180 tablet 3   cetirizine (ZYRTEC) 10 MG tablet Take 10 mg by mouth daily.     Cholecalciferol  50 MCG (2000 UT) TABS Take 2 tablets (4,000 Units total) by mouth daily. (Patient taking differently: Take 4,000 Units by mouth 2 (two) times daily.) 180 tablet 1   Continuous Blood Gluc Sensor (FREESTYLE LIBRE 14 DAY SENSOR) MISC U UTD     digoxin  (LANOXIN ) 0.125 MG tablet TAKE 1 TABLET(125 MCG) BY MOUTH DAILY 90 tablet 3   ENTRESTO  97-103 MG TAKE 1 TABLET BY MOUTH TWICE DAILY 180 tablet 3   fluticasone  (CUTIVATE ) 0.005 % ointment Apply 1 Application topically as needed.     furosemide  (LASIX ) 40 MG tablet TAKE 1 TABLET BY MOUTH THREE TIMES A WEEK, ON MONDAY, WEDNESDAY, AND FRIDAY. MAY TAKE ADDITIONAL TABLETS AS NEEDED. 180 tablet 0   Insulin  Aspart, w/Niacinamide , (FIASP  [place]) Inject 24-40 Units into the skin 3 (three) times daily before meals. W sliding scale as needed     levothyroxine  (SYNTHROID , LEVOTHROID) 25 MCG tablet TAKE 1 TABLET ONCE DAILY BEFORE BREAKFAST 30 tablet 0   metFORMIN  (GLUCOPHAGE -XR) 500 MG 24 hr tablet Take 500 mg by mouth daily.     PARoxetine  (PAXIL ) 30 MG tablet TAKE 1 TABLET(30 MG) BY MOUTH DAILY 90 tablet 1   spironolactone  (ALDACTONE ) 25 MG tablet TAKE 1/2 TABLET(12.5 MG) BY MOUTH DAILY 45 tablet 3   thiamine  (VITAMIN B1) 100 MG tablet TAKE 1 TABLET(100 MG) BY MOUTH EVERY OTHER DAY (Patient taking differently: Take 100 mg by mouth daily.) 45 tablet 1   tirzepatide  (MOUNJARO ) 15 MG/0.5ML Pen Inject 15 mg into the skin once a week. 2 mL 0   TRESIBA FLEXTOUCH 100 UNIT/ML FlexTouch Pen Inject 10 Units into the skin at bedtime.      No  results found for this or any previous visit (from the past 48 hours). No results found.  ROS: Pain with rom of the left upper extremity  Physical Exam: Alert and oriented 63 y.o. male in no acute distress Cranial nerves 2-12 intact Cervical spine: full rom with no tenderness, nv intact distally Chest: active breath sounds bilaterally, no wheeze rhonchi or rales Heart: regular rate and rhythm, no murmur Abd: non tender non distended with active bowel sounds Hip is stable with rom  Left shoulder painful/decreased rom due to pain Nv intact distally No rashes  or edema distally   Assessment/Plan Assessment: left shoulder loose glenoid component  Plan:  Patient will undergo a left shoulder revision arthroplasty by Dr. [name] at [facility] Risks benefits and expectations were discussed with the patient. Patient understand risks, benefits and expectations and wishes to proceed. Preoperative templating of the joint replacement has been completed, documented, and submitted to the Operating Room personnel in order to optimize intra-operative equipment management.   Arvella Fireman PA-C, MPAS [facility] is now [employer] [address], [place], [place] [address] Phone: [phone] [email or web address] Facebook  [employer]

## 2023-05-30 ENCOUNTER — Encounter (HOSPITAL_COMMUNITY): Payer: Self-pay

## 2023-05-30 ENCOUNTER — Telehealth: Payer: Self-pay | Admitting: Internal Medicine

## 2023-05-30 ENCOUNTER — Other Ambulatory Visit: Payer: Self-pay

## 2023-05-30 ENCOUNTER — Encounter (HOSPITAL_COMMUNITY)
Admission: RE | Admit: 2023-05-30 | Discharge: 2023-05-30 | Disposition: A | Discharge status: Home / Self Care | Payer: BC Managed Care – PPO | Source: Ambulatory Visit | Attending: Orthopedic Surgery | Admitting: Orthopedic Surgery

## 2023-05-30 VITALS — BP 122/79 | HR 76 | Temp 98.1°F | Resp 16 | Ht 74.0 in | Wt 265.4 lb

## 2023-05-30 DIAGNOSIS — Z01812 Encounter for preprocedural laboratory examination: Secondary | ICD-10-CM | POA: Diagnosis not present

## 2023-05-30 DIAGNOSIS — E119 Type 2 diabetes mellitus without complications: Secondary | ICD-10-CM | POA: Diagnosis not present

## 2023-05-30 DIAGNOSIS — Z794 Long term (current) use of insulin: Secondary | ICD-10-CM | POA: Diagnosis not present

## 2023-05-30 DIAGNOSIS — Z01818 Encounter for other preprocedural examination: Secondary | ICD-10-CM

## 2023-05-30 HISTORY — DX: Hypothyroidism, unspecified: E03.9

## 2023-05-30 LAB — BASIC METABOLIC PANEL WITH GFR
Anion gap: 10 (ref 5–15)
BUN: 23 mg/dL (ref 8–23)
CO2: 25 mmol/L (ref 22–32)
Calcium: 9.3 mg/dL (ref 8.9–10.3)
Chloride: 103 mmol/L (ref 98–111)
Creatinine, Ser: 1.02 mg/dL (ref 0.61–1.24)
GFR, Estimated: >60 mL/min (ref >60)
Glucose, Bld: 132 mg/dL — ABNORMAL HIGH (ref 70–99)
Potassium: 3.9 mmol/L (ref 3.5–5.1)
Sodium: 138 mmol/L (ref 135–145)

## 2023-05-30 LAB — GLUCOSE, CAPILLARY: Glucose-Capillary: 142 mg/dL — ABNORMAL HIGH (ref 70–99)

## 2023-05-30 LAB — CBC
HCT: 44.5 % (ref 39.0–52.0)
Hemoglobin: 14.5 g/dL (ref 13.0–17.0)
MCH: 29.1 pg (ref 26.0–34.0)
MCHC: 32.6 g/dL (ref 30.0–36.0)
MCV: 89.2 fL (ref 80.0–100.0)
Platelets: 204 K/uL (ref 150–400)
RBC: 4.99 MIL/uL (ref 4.22–5.81)
RDW: 12.2 % (ref 11.5–15.5)
WBC: 6.3 K/uL (ref 4.0–10.5)
nRBC: 0 % (ref 0.0–0.2)

## 2023-05-30 LAB — SURGICAL PCR SCREEN
MRSA, PCR: NEGATIVE
Staphylococcus aureus: NEGATIVE

## 2023-05-30 NOTE — Telephone Encounter (Signed)
 We have received surgical clearance forms from [facility] and they have been placed in the providers box.  Please fax to: [phone]

## 2023-05-30 NOTE — Telephone Encounter (Signed)
 Patient has been scheduled for an appointment 1/7 at 2:20 PM for surgical clearance.

## 2023-05-30 NOTE — Telephone Encounter (Signed)
 Per Dr. Graciela Husbands he is aware that patient surgery is 06/06/2023 and has no further objections.  Levi Aland, NP-C  05/30/2023, 4:18 PM [address], [address] Office [phone] Fax [phone]

## 2023-05-30 NOTE — Telephone Encounter (Signed)
 Surgery scheduler Kirke called the office in regard to clearance as pt has just had a device implant. Per Kirke she has been asked to confirm with Dr. Fernande that he is aware the pt's surgery 06/06/23. Per surgery scheduler she was informed notes stating waiting until after 06/16/23 in post implant procedure for the pt no heavy lifting. Question being asked today by the surgeon office is Dr. Fernande ok with pt to have his surgery 06/06/23 as planned or does he prefer the pt to wait until after 06/16/23.   I assured Kirke I will reach out to the pre op APP today for further review, we may need to reach out to Dr. Fernande as well.

## 2023-05-31 LAB — HEMOGLOBIN A1C
Hgb A1c MFr Bld: 8.2 % — ABNORMAL HIGH (ref 4.8–5.6)
Mean Plasma Glucose: 189 mg/dL

## 2023-06-03 ENCOUNTER — Ambulatory Visit: Payer: BC Managed Care – PPO | Admitting: Internal Medicine

## 2023-06-03 ENCOUNTER — Encounter: Payer: Self-pay | Admitting: Internal Medicine

## 2023-06-03 VITALS — BP 120/80 | HR 73 | Temp 97.7°F | Resp 16 | Ht 74.0 in | Wt 273.0 lb

## 2023-06-03 DIAGNOSIS — I498 Other specified cardiac arrhythmias: Secondary | ICD-10-CM | POA: Diagnosis not present

## 2023-06-03 DIAGNOSIS — I110 Hypertensive heart disease with heart failure: Secondary | ICD-10-CM | POA: Diagnosis not present

## 2023-06-03 DIAGNOSIS — E118 Type 2 diabetes mellitus with unspecified complications: Secondary | ICD-10-CM

## 2023-06-03 DIAGNOSIS — E1165 Type 2 diabetes mellitus with hyperglycemia: Secondary | ICD-10-CM

## 2023-06-03 DIAGNOSIS — I10 Essential (primary) hypertension: Secondary | ICD-10-CM

## 2023-06-03 DIAGNOSIS — I428 Other cardiomyopathies: Secondary | ICD-10-CM | POA: Diagnosis not present

## 2023-06-03 LAB — TSH: TSH: 1.94 u[IU]/mL (ref 0.35–5.50)

## 2023-06-03 LAB — TROPONIN I (HIGH SENSITIVITY): High Sens Troponin I: 8 ng/L (ref 2–17)

## 2023-06-03 LAB — BRAIN NATRIURETIC PEPTIDE: Pro B Natriuretic peptide (BNP): 414 pg/mL — ABNORMAL HIGH (ref 0.0–100.0)

## 2023-06-03 NOTE — Progress Notes (Signed)
 PERIOPERATIVE PRESCRIPTION FOR IMPLANTED CARDIAC DEVICE PROGRAMMING  Patient Information: Name:  Gregory [name]  DOB:  09/06/1960  MRN:  [id number]    Planned Procedure:  L total shoulder revision arthroplasty  Surgeon:  Dr. Marcey Her  Date of Procedure:  06-06-23  Cautery will be used. Yes  Position during surgery:  Supine   Please send documentation back to:  Darryle Law (Fax # [phone])  Device Information:  Clinic EP Physician:  Elspeth Sage, MD   Device Type:  Defibrillator Manufacturer and Phone #:  [employer]: [id number] Pacemaker Dependent?:  No. Date of Last Device Check:  05/22/2023 Normal Device Function?:  Yes.    Electrophysiologist's Recommendations:  Have magnet available. Provide continuous ECG monitoring when magnet is used or reprogramming is to be performed.  Procedure will likely interfere with device function.  Device should be programmed:  Tachy therapies disabled  Per Device Clinic Standing Orders, Almarie ONEIDA Shutter, RN  3:43 PM 06/03/2023

## 2023-06-03 NOTE — Patient Instructions (Signed)
 Premature Ventricular Contraction  A premature ventricular contraction (PVC) is a type of irregular heartbeat (arrhythmia). The heart has four chambers, including the upper chambers (atria) and lower chambers (ventricles). Normally, an electrical signal starts in a group of cells called the sinoatrial node (SA node) and travels through the atria, causing them to pump blood into the ventricles. During a PVC, the heartbeat starts in one of the lower ventricles. This may cause the heartbeat to be shorter and less effective. In most cases, PVCs come and go and do not require treatment. What are the causes? Common causes of the condition include: Heart attack or coronary artery disease (CAD). Heart valve problems. Heart surgery. Infection of the heart (myocarditis). Inflammation of the heart. In many cases, the cause of this condition is not known. What increases the risk? The following factors may make you more likely to develop this condition: Age, especially being over age 19. Being male. An imbalance of salts and minerals in the body (electrolytes). Low blood oxygen levels or high carbon dioxide levels. Certain medicines, including over-the-counter and prescribed medicines. High blood pressure. Obesity. Episodes may be triggered by: Vigorous exercise. Tobacco, alcohol, or caffeine use. Illegal drug use. Emotional stress. Poor or irregular sleep. What are the signs or symptoms? The main symptoms of this condition are fast or irregular heartbeats (palpitations) or the feeling of a pause in the heartbeat. Other symptoms include: Shortness of breath. Difficulty exercising. Chest pain. Feeling tired. Dizziness. In some cases, there are no symptoms. How is this diagnosed? This condition may be diagnosed based on: Your medical history or symptoms. A physical exam. Your health care provider may listen to your heart. Tests, such as: Blood tests. An ECG (electrocardiogram) to monitor the  electrical activity of your heart. An ambulatory cardiac monitor that records your heartbeats for 24 hours or more. Stress tests to see how exercise affects your heart rhythm and blood supply. An echocardiogram, which creates an image of your heart. An electrophysiology study (EPS) to check for electrical problems in your heart. How is this treated? Treatment for this condition depends on any underlying conditions, the type of PVC, how many PVCs you have had, and if the symptoms are affecting your daily life. Possible treatments include: Avoiding things that cause PVCs (triggers). These include caffeine, tobacco, and alcohol. Taking medicines if symptoms are severe or if the arrhythmias happen a lot. Getting treatment for underlying conditions that cause PVCs. Having an implantable cardioverter defibrillator (ICD) placed in the chest to monitor the heartbeat. The monitor sends a shock to the heart if it senses an arrhythmia and brings the heartbeat back to normal. Having a catheter ablation procedure to destroy the part of the heart tissue that sends abnormal signals. In many cases, no treatment is required. Follow these instructions at home: Lifestyle  Do not use any products that contain nicotine or tobacco. These products include cigarettes, chewing tobacco, and vaping devices, such as e-cigarettes. If you need help quitting, ask your health care provider. Do not use illegal drugs. Exercise regularly. Ask your health care provider what type of exercise is safe for you. Try to get at least 7-9 hours of sleep each night. Find healthy ways to manage stress. Avoid stressful situations when possible. Alcohol use Do not drink alcohol if: Your health care provider tells you not to drink. You are pregnant, may be pregnant, or are planning to become pregnant. Alcohol triggers your episodes. If you drink alcohol: Limit how much you have to: 0-1  drink a day for women. 0-2 drinks a day for  men. Know how much alcohol is in your drink. In the [place] one drink equals one 12 oz bottle of beer (355 mL), one 5 oz glass of wine (148 mL), or one 1 oz glass of hard liquor (44 mL). General instructions Take over-the-counter and prescription medicines only as told by your health care provider. If caffeine triggers episodes of PVC, do not eat, drink, or use anything with caffeine in it. Contact a health care provider if: You feel palpitations often. You have nausea and vomiting. Get help right away if: You have chest pain. You have trouble breathing. You start sweating for no reason. You become light-headed or you faint. These symptoms may be an emergency. Get help right away. Call 911. Do not wait to see if the symptoms will go away. Do not drive yourself to the hospital. This information is not intended to replace advice given to you by your health care provider. Make sure you discuss any questions you have with your health care provider. Document Revised: 10/12/2021 Document Reviewed: 10/12/2021 Elsevier Patient Education  2024 [employer].

## 2023-06-03 NOTE — Progress Notes (Signed)
 Gregory [name] ---  The blood test for heart failure is high. I have placed a copy of your EKGs in my note and will let your cardiology team know.  Sanda Linger, MD

## 2023-06-03 NOTE — Progress Notes (Signed)
 Subjective:  Patient ID: Gregory [name], male    DOB: January 03, 1961  Age: 63 y.o. MRN: [id number]  CC: Diabetes and Congestive Heart Failure   HPI [name] presents for f/up ----  Discussed the use of AI scribe software for clinical note transcription with the patient, who gave verbal consent to proceed.  History of Present Illness   The patient, with a history of cardiac disease and a recent defibrillator placement, presents with ongoing left shoulder pain. The patient reports a dull, constant pain that is described as annoying. There were concerns of potential internal infection; however, blood work did not indicate any infection. The patient is scheduled for surgery to address the issue, with the possibility of a total replacement if necessary.  Following the defibrillator placement, the patient experienced a brief episode of AFib, for which he was temporarily placed on Eliquis . However, this was discontinued after a week due to the brief duration of the AFib episode. The patient denies any complications from the defibrillator placement, including palpitations, chest pain, or shortness of breath.  The patient's current medication regimen includes carvedilol , digoxin , Entresto , and atorvastatin . He reports taking a diuretic only when feeling fluid accumulation, which has not been necessary for several months. The patient also manages diabetes with a CGM, aiming to maintain blood sugar levels between 60 and 130. He reports no recent episodes of hypoglycemia requiring treatment.  The patient denies any muscle aches from statin use and reports no dedicated exercise regimen but maintains a fair amount of activity in his work. He denies any recent swelling in his legs or feet.        ROS Review of Systems  Constitutional:  Negative for appetite change, diaphoresis and fatigue.  HENT: Negative.    Respiratory:  Negative for chest tightness, shortness of breath and wheezing.    Cardiovascular:  Negative for chest pain, palpitations and leg swelling.  Gastrointestinal: Negative.  Negative for abdominal pain, diarrhea, nausea and vomiting.  Genitourinary: Negative.  Negative for difficulty urinating, dysuria and hematuria.  Musculoskeletal:  Positive for arthralgias. Negative for back pain.  Skin: Negative.   Neurological: Negative.  Negative for dizziness, weakness and light-headedness.  Hematological:  Negative for adenopathy. Does not bruise/bleed easily.  Psychiatric/Behavioral: Negative.      Objective:  BP 120/80 (BP Location: Right Arm, Patient Position: Sitting, Cuff Size: Normal)   Pulse 73   Temp 97.7 F (36.5 C) (Oral)   Resp 16   Ht 6' 2 (1.88 m)   Wt 273 lb (123.8 kg)   SpO2 99%   BMI 35.05 kg/m   BP Readings from Last 3 Encounters:  06/03/23 120/80  05/30/23 122/79  05/05/23 (!) 145/88    Wt Readings from Last 3 Encounters:  06/03/23 273 lb (123.8 kg)  05/30/23 265 lb 6.4 oz (120.4 kg)  05/05/23 270 lb (122.5 kg)    Physical Exam Vitals reviewed.  Constitutional:      Appearance: Normal appearance.  HENT:     Mouth/Throat:     Mouth: Mucous membranes are moist.  Eyes:     General: No scleral icterus.    Conjunctiva/sclera: Conjunctivae normal.  Cardiovascular:     Rate and Rhythm: Normal rate and regular rhythm. Frequent Extrasystoles are present.    Heart sounds: S1 normal and S2 normal.     Gallop present.     Comments: EKG- SR with PVC's/bigeminy, 71 bpm - new Anterior infarct pattern is old Lateral infarct pattern  is now No tracing in III  Pulmonary:     Effort: Pulmonary effort is normal.     Breath sounds: No stridor. No wheezing, rhonchi or rales.  Abdominal:     General: Abdomen is flat.     Palpations: There is no mass.     Tenderness: There is no abdominal tenderness. There is no guarding.     Hernia: No hernia is present.  Musculoskeletal:     Cervical back: Neck supple.     Right lower leg: No edema.      Left lower leg: No edema.  Skin:    General: Skin is warm and dry.  Neurological:     General: No focal deficit present.     Mental Status: He is alert. Mental status is at baseline.  Psychiatric:        Mood and Affect: Mood normal.        Behavior: Behavior normal.     Lab Results  Component Value Date   WBC 6.3 05/30/2023   HGB 14.5 05/30/2023   HCT 44.5 05/30/2023   PLT 204 05/30/2023   GLUCOSE 132 (H) 05/30/2023   CHOL 164 10/10/2022   TRIG 254 (A) 10/10/2022   HDL 39 10/10/2022   LDLDIRECT 95.0 04/25/2021   LDLCALC 81 10/10/2022   ALT 9 01/10/2023   AST 14 01/10/2023   NA 138 05/30/2023   K 3.9 05/30/2023   CL 103 05/30/2023   CREATININE 1.02 05/30/2023   BUN 23 05/30/2023   CO2 25 05/30/2023   TSH 1.94 06/03/2023   PSA 1.44 01/10/2023   INR 0.9 10/27/2019   HGBA1C 8.2 (H) 05/30/2023   MICROALBUR 1.4 06/03/2023    No results found.  Assessment & Plan:   Essential hypertension - BP is well controlled. 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 -     TSH; Future  NICM (nonischemic cardiomyopathy) (HCC) -     Brain natriuretic peptide; Future -     Troponin I (High Sensitivity); Future  Bigeminal rhythm -     Brain natriuretic peptide; Future -     Troponin I (High Sensitivity); Future  Poorly controlled type 2 diabetes mellitus with complication (HCC) -     Microalbumin / creatinine urine ratio; Future     Follow-up: Return in about 3 months (around 09/01/2023).  Debby Molt, MD

## 2023-06-04 ENCOUNTER — Encounter: Payer: Self-pay | Admitting: Internal Medicine

## 2023-06-04 DIAGNOSIS — R002 Palpitations: Secondary | ICD-10-CM

## 2023-06-04 LAB — URINALYSIS, ROUTINE W REFLEX MICROSCOPIC
Bilirubin Urine: NEGATIVE
Hgb urine dipstick: NEGATIVE
Ketones, ur: NEGATIVE
Leukocytes,Ua: NEGATIVE
Nitrite: NEGATIVE
RBC / HPF: NONE SEEN (ref 0–?)
Specific Gravity, Urine: 1.025 (ref 1.000–1.030)
Total Protein, Urine: NEGATIVE
Urine Glucose: NEGATIVE
Urobilinogen, UA: 0.2 (ref 0.0–1.0)
pH: 6 (ref 5.0–8.0)

## 2023-06-04 LAB — MICROALBUMIN / CREATININE URINE RATIO
Creatinine,U: 117.6 mg/dL
Microalb Creat Ratio: 1.2 mg/g (ref 0.0–30.0)
Microalb, Ur: 1.4 mg/dL (ref 0.0–1.9)

## 2023-06-04 NOTE — Progress Notes (Addendum)
 Anesthesia Chart Review   Case: [id number] Date/Time: 06/06/23 1329   Procedure: SHOULDER ARTHROPLASTY REVISION (Left: Shoulder) - with interscalene block   Anesthesia type: General   Pre-op  diagnosis: Failed left total shoulder arthroplasty   Location: [facility] 06 / [facility]   Surgeons: [name] Kemps, MD       DISCUSSION:62 y.o. never smoker with h/o PONV, HTN, nonischemic cardiomyopathy, pacemaker in place, implanted 05/05/2023 (device orders in 06/03/2023 progress note, device rep has been contacted), poorly controlled DM II (A1C 8.2), renal cell carcinoma s/p right radical nephrectomy, failed left total shoulder arthroplasty scheduled for above procedure 06/06/2023 with Dr. Kemps [name] .   Nonischemic cardiomyopathy, unclear etiology.  Echo 07/10/2022 EF 20-25%.  ICD placed 05/05/2023. He has been cleared by cardio to proceed with shoulder procedure.   Per pharmacy pt can hold Eliquis  for 3 days prior to shoulder surgery.   Per Dr. Fernande, Should be acceptable cardiovascular risk for surgical revision of his shoulder  Per updated preoperative evaluation from cardiology 06/04/2023, Chart reviewed as part of pre-operative protocol coverage. JONAVON TRIEU was seen by PCP, Dr. Debby Molt, on 06/02/2022. BNP was drawn as part of his workup and was elevated at 414. Spoke with patient, who stated he is not retaining fluid. He denies lower extremity edema, shortness of breath, orthopnea or PND. He has not needed to take Lasix  in several months. His weight is stable in our system. He agrees to take 1 dose of Lasix  today as a prophylactic measure.    He is having asymptomatic PVCs as evidenced by EKG performed by PCP. Patient will be sent a Zio monitor to wear after surgery to determine PVC burden. This plan will not delay surgery.    Therefore, per Dr. Fernande, patient is an acceptable risk for planned surgery.    Per Pharm D, patient can hold Eliquis  for 3 days prior to procedure.   Patient will not  need bridging with Lovenox  (enoxaparin ) around procedure  VS: BP 122/79   Pulse 76   Temp 36.7 C (Oral)   Resp 16   Ht 6' 2 (1.88 m)   Wt 120.4 kg   SpO2 98%   BMI 34.08 kg/m   PROVIDERS: Molt Debby CROME, MD is PCP   Cherrie Sieving, MD is Cardiologist   Fernande Standing, MD is EP LABS:  labs forwarded to surgeon and PCP (all labs ordered are listed, but only abnormal results are displayed)  Labs Reviewed  HEMOGLOBIN A1C - Abnormal; Notable for the following components:      Result Value   Hgb A1c MFr Bld 8.2 (*)    All other components within normal limits  BASIC METABOLIC PANEL - Abnormal; Notable for the following components:   Glucose, Bld 132 (*)    All other components within normal limits  GLUCOSE, CAPILLARY - Abnormal; Notable for the following components:   Glucose-Capillary 142 (*)    All other components within normal limits  SURGICAL PCR SCREEN  CBC  TYPE AND SCREEN     IMAGES:   EKG:   CV: Echo 07/10/2022 1. Left ventricular ejection fraction, by estimation, is 20 to 25%. The  left ventricle has severely decreased function. The left ventricle  demonstrates global hypokinesis. The left ventricular internal cavity size  was mildly to moderately dilated. Left  ventricular diastolic parameters are consistent with Grade I diastolic  dysfunction (impaired relaxation).   2. Right ventricular systolic function is normal. The right ventricular  size is normal.  3. Left atrial size was moderately dilated.   4. The mitral valve is grossly normal. Trivial mitral valve  regurgitation. No evidence of mitral stenosis.   5. The aortic valve is tricuspid. There is mild calcification of the  aortic valve. Aortic valve regurgitation is trivial. No aortic stenosis is  present.   6. Aortic dilatation noted. There is borderline dilatation of the aortic  root, measuring 38 mm.   7. The inferior vena cava is normal in size with greater than 50%  respiratory  variability, suggesting right atrial pressure of 3 mmHg.  Past Medical History:  Diagnosis Date   Allergy    Anemia    low iron   Anxiety    Cancer (HCC)    Renal cell cancer   CHF (congestive heart failure) (HCC)    Complex tear of lateral meniscus of right knee as current injury 03/20/2017   Complex tear of medial meniscus of right knee 03/20/2017   Diabetes mellitus without complication (HCC)    History of gastrointestinal hemorrhage    Hypertension    Hypertriglyceridemia    Hypothyroidism    Macular degeneration    lasar surgery- Dr Alvia   Neuropathy    Nonischemic cardiomyopathy (HCC)    EF 25-30% by echo 09/12/16   Plantar warts    PONV (postoperative nausea and vomiting)     a little bit of nausea   Primary localized osteoarthritis of right knee 03/20/2017   Renal cell carcinoma (HCC)    s/p nephrectomy 02/07/16 at [facility])    Past Surgical History:  Procedure Laterality Date   CARDIAC CATHETERIZATION N/A 04/19/2016   Procedure: Left Heart Cath and Coronary Angiography;  Surgeon: Toribio JONELLE Fuel, MD;  Location: [facility] INVASIVE CV LAB;  Service: Cardiovascular;  Laterality: N/A;   CHONDROPLASTY Right 03/20/2017   Procedure: CHONDROPLASTY;  Surgeon: Josefina Chew, MD;  Location: [facility] OR;  Service: Orthopedics;  Laterality: Right;   COLONOSCOPY  03/22/2005   EYE SURGERY Right    laser surgery, right eye macular degeneration   ICD IMPLANT N/A 05/05/2023   Procedure: ICD IMPLANT;  Surgeon: Fernande Elspeth BROCKS, MD;  Location: [facility] INVASIVE CV LAB;  Service: Cardiovascular;  Laterality: N/A;   KNEE ARTHROSCOPY Right    x 2   KNEE ARTHROSCOPY WITH MEDIAL MENISECTOMY Right 03/20/2017   Procedure: KNEE ARTHROSCOPY WITH MEDIAL AND LATERAL MENISECTOMY;  Surgeon: Josefina Chew, MD;  Location: [facility] OR;  Service: Orthopedics;  Laterality: Right;   NEPHRECTOMY Right    orif fx fibula     sebaceous cyst excision  1985   TONSILLECTOMY     TOTAL SHOULDER ARTHROPLASTY Left  11/11/2014   Procedure: LEFT TOTAL SHOULDER ARTHROPLASTY;  Surgeon: Marcey Her, MD;  Location: [facility] OR;  Service: Orthopedics;  Laterality: Left;   VASECTOMY      MEDICATIONS:  Acetaminophen  (TYLENOL  PO)   atorvastatin  (LIPITOR ) 80 MG tablet   carvedilol  (COREG ) 25 MG tablet   cetirizine (ZYRTEC) 10 MG tablet   Cholecalciferol  50 MCG (2000 UT) TABS   Continuous Blood Gluc Sensor (FREESTYLE LIBRE 14 DAY SENSOR) MISC   digoxin  (LANOXIN ) 0.125 MG tablet   ENTRESTO  97-103 MG   FIASP  FLEXTOUCH 100 UNIT/ML FlexTouch Pen   fluticasone  (CUTIVATE ) 0.05 % cream   furosemide  (LASIX ) 40 MG tablet   levothyroxine  (SYNTHROID , LEVOTHROID) 25 MCG tablet   metFORMIN  (GLUCOPHAGE -XR) 500 MG 24 hr tablet   PARoxetine  (PAXIL ) 30 MG tablet   spironolactone  (ALDACTONE ) 25 MG tablet   thiamine  (VITAMIN  B1) 100 MG tablet   tirzepatide  (MOUNJARO ) 15 MG/0.5ML Pen   TRESIBA FLEXTOUCH 100 UNIT/ML FlexTouch Pen    sodium chloride  flush (NS) 0.9 % injection 3 mL    [facility] Ward, PA-C [facility] Pre-Surgical Testing [phone]

## 2023-06-04 NOTE — Telephone Encounter (Signed)
   Name: Gregory [name]  DOB: 1961/04/20  MRN: [id number]   Primary Cardiologist: None  Chart reviewed as part of pre-operative protocol coverage. [name] was seen by PCP, Dr. Debby Molt, on 06/02/2022. BNP was drawn as part of his workup and was elevated at 414. Spoke with patient, who stated he is not retaining fluid. He denies lower extremity edema, shortness of breath, orthopnea or PND. He has not needed to take Lasix  in several months. His weight is stable in our system. He agrees to take 1 dose of Lasix  today as a prophylactic measure.   He is having asymptomatic PVCs as evidenced by EKG performed by PCP. Patient will be sent a Zio monitor to wear after surgery to determine PVC burden. This plan will not delay surgery.   Therefore, per Dr. Fernande, patient is an acceptable risk for planned surgery.   Per Pharm D, patient can hold Eliquis  for 3 days prior to procedure.   Patient will not need bridging with Lovenox  (enoxaparin ) around procedure.  I will route this recommendation to the requesting party via Epic fax function and remove from pre-op  pool. Please call with questions.  [name], NP 06/04/2023, 1:37 PM

## 2023-06-04 NOTE — Anesthesia Preprocedure Evaluation (Addendum)
 Anesthesia Evaluation  Patient identified by MRN, date of birth, ID band Patient awake    Reviewed: Allergy & Precautions, NPO status , Patient's Chart, lab work & pertinent test results  History of Anesthesia Complications (+) PONV and history of anesthetic complications  Airway Mallampati: II  TM Distance: >3 FB Neck ROM: Full    Dental  (+) Dental Advisory Given   Pulmonary neg pulmonary ROS   breath sounds clear to auscultation       Cardiovascular hypertension, Pt. on medications +CHF   Rhythm:Regular Rate:Normal     Neuro/Psych negative neurological ROS     GI/Hepatic negative GI ROS, Neg liver ROS,,,  Endo/Other  diabetes, Type 2Hypothyroidism    Renal/GU Renal disease (s/p nephrectomy)     Musculoskeletal  (+) Arthritis ,    Abdominal   Peds  Hematology negative hematology ROS (+)   Anesthesia Other Findings   Reproductive/Obstetrics                             Anesthesia Physical Anesthesia Plan  ASA: 4  Anesthesia Plan: General   Post-op Pain Management: Tylenol  PO (pre-op )* and Regional block*   Induction: Intravenous  PONV Risk Score and Plan: 3 and Dexamethasone , Ondansetron  and Midazolam   Airway Management Planned: Oral ETT  Additional Equipment: Arterial line  Intra-op Plan:   Post-operative Plan: Extubation in OR  Informed Consent: I have reviewed the patients History and Physical, chart, labs and discussed the procedure including the risks, benefits and alternatives for the proposed anesthesia with the patient or authorized representative who has indicated his/her understanding and acceptance.     Dental advisory given  Plan Discussed with: CRNA  Anesthesia Plan Comments:        Anesthesia Quick Evaluation

## 2023-06-05 ENCOUNTER — Ambulatory Visit: Payer: BC Managed Care – PPO | Attending: Internal Medicine

## 2023-06-05 DIAGNOSIS — R002 Palpitations: Secondary | ICD-10-CM

## 2023-06-05 NOTE — Progress Notes (Unsigned)
 Enrolled for [employer] to mail a ZIO XT long term holter monitor to the patients address on file.  Requested delay shipping to deliver 06/19/23.

## 2023-06-06 ENCOUNTER — Ambulatory Visit (HOSPITAL_COMMUNITY): Payer: BC Managed Care – PPO | Admitting: Medical

## 2023-06-06 ENCOUNTER — Encounter (HOSPITAL_COMMUNITY): Payer: Self-pay | Admitting: Orthopedic Surgery

## 2023-06-06 ENCOUNTER — Observation Stay (HOSPITAL_COMMUNITY): Payer: BC Managed Care – PPO

## 2023-06-06 ENCOUNTER — Encounter (HOSPITAL_COMMUNITY)
Admission: RE | Disposition: A | Discharge status: Home / Self Care | Payer: Self-pay | Source: Home / Self Care | Attending: Orthopedic Surgery

## 2023-06-06 ENCOUNTER — Other Ambulatory Visit: Payer: Self-pay

## 2023-06-06 ENCOUNTER — Ambulatory Visit (HOSPITAL_COMMUNITY): Payer: BC Managed Care – PPO | Admitting: Certified Registered"

## 2023-06-06 ENCOUNTER — Observation Stay (HOSPITAL_COMMUNITY)
Admission: RE | Admit: 2023-06-06 | Discharge: 2023-06-07 | Disposition: A | Discharge status: Home / Self Care | Payer: BC Managed Care – PPO | Attending: Orthopedic Surgery | Admitting: Orthopedic Surgery

## 2023-06-06 DIAGNOSIS — Y828 Other medical devices associated with adverse incidents: Secondary | ICD-10-CM | POA: Diagnosis not present

## 2023-06-06 DIAGNOSIS — E039 Hypothyroidism, unspecified: Secondary | ICD-10-CM | POA: Diagnosis not present

## 2023-06-06 DIAGNOSIS — I110 Hypertensive heart disease with heart failure: Secondary | ICD-10-CM | POA: Insufficient documentation

## 2023-06-06 DIAGNOSIS — Z85528 Personal history of other malignant neoplasm of kidney: Secondary | ICD-10-CM | POA: Diagnosis not present

## 2023-06-06 DIAGNOSIS — Z794 Long term (current) use of insulin: Secondary | ICD-10-CM | POA: Insufficient documentation

## 2023-06-06 DIAGNOSIS — G8918 Other acute postprocedural pain: Secondary | ICD-10-CM | POA: Diagnosis not present

## 2023-06-06 DIAGNOSIS — Z96612 Presence of left artificial shoulder joint: Secondary | ICD-10-CM | POA: Diagnosis not present

## 2023-06-06 DIAGNOSIS — I509 Heart failure, unspecified: Secondary | ICD-10-CM | POA: Insufficient documentation

## 2023-06-06 DIAGNOSIS — E119 Type 2 diabetes mellitus without complications: Secondary | ICD-10-CM | POA: Insufficient documentation

## 2023-06-06 DIAGNOSIS — Z7984 Long term (current) use of oral hypoglycemic drugs: Secondary | ICD-10-CM | POA: Diagnosis not present

## 2023-06-06 DIAGNOSIS — Z01818 Encounter for other preprocedural examination: Principal | ICD-10-CM

## 2023-06-06 DIAGNOSIS — T84038A Mechanical loosening of other internal prosthetic joint, initial encounter: Secondary | ICD-10-CM | POA: Diagnosis not present

## 2023-06-06 DIAGNOSIS — T84098A Other mechanical complication of other internal joint prosthesis, initial encounter: Secondary | ICD-10-CM | POA: Diagnosis not present

## 2023-06-06 DIAGNOSIS — Z79899 Other long term (current) drug therapy: Secondary | ICD-10-CM | POA: Diagnosis not present

## 2023-06-06 DIAGNOSIS — Z471 Aftercare following joint replacement surgery: Secondary | ICD-10-CM | POA: Diagnosis not present

## 2023-06-06 DIAGNOSIS — Z7901 Long term (current) use of anticoagulants: Secondary | ICD-10-CM | POA: Insufficient documentation

## 2023-06-06 DIAGNOSIS — I130 Hypertensive heart and chronic kidney disease with heart failure and stage 1 through stage 4 chronic kidney disease, or unspecified chronic kidney disease: Secondary | ICD-10-CM | POA: Diagnosis not present

## 2023-06-06 HISTORY — PX: TOTAL SHOULDER REVISION: SHX6130

## 2023-06-06 LAB — GLUCOSE, CAPILLARY
Glucose-Capillary: 282 mg/dL — ABNORMAL HIGH (ref 70–99)
Glucose-Capillary: 122 mg/dL — ABNORMAL HIGH (ref 70–99)
Glucose-Capillary: 143 mg/dL — ABNORMAL HIGH (ref 70–99)
Glucose-Capillary: 249 mg/dL — ABNORMAL HIGH (ref 70–99)
Glucose-Capillary: 321 mg/dL — ABNORMAL HIGH (ref 70–99)
Glucose-Capillary: 285 mg/dL — ABNORMAL HIGH (ref 70–99)

## 2023-06-06 LAB — TYPE AND SCREEN
ABO/RH(D): O NEG
Antibody Screen: NEGATIVE

## 2023-06-06 SURGERY — REVISION, TOTAL ARTHROPLASTY, SHOULDER
Anesthesia: General | Site: Shoulder | Laterality: Left

## 2023-06-06 MED ORDER — ROCURONIUM BROMIDE 10 MG/ML (PF) SYRINGE
PREFILLED_SYRINGE | INTRAVENOUS | Status: DC | PRN
  Administered 2023-06-06: 50 mg via INTRAVENOUS

## 2023-06-06 MED ORDER — METHOCARBAMOL 500 MG PO TABS: 500.0000 mg | ORAL_TABLET | Freq: Three times a day (TID) | ORAL | 1 refills | Status: DC | PRN

## 2023-06-06 MED ORDER — FENTANYL CITRATE (PF) 100 MCG/2ML IJ SOLN
INTRAMUSCULAR | Status: AC
  Filled 2023-06-06: qty 2

## 2023-06-06 MED ORDER — CARVEDILOL 25 MG PO TABS
25.0000 mg | ORAL_TABLET | Freq: Two times a day (BID) | ORAL | Status: DC
  Administered 2023-06-07: 25 mg via ORAL
  Filled 2023-06-06: qty 1

## 2023-06-06 MED ORDER — METHOCARBAMOL 1000 MG/10ML IJ SOLN: 500.0000 mg | Freq: Four times a day (QID) | INTRAMUSCULAR | Status: DC | PRN

## 2023-06-06 MED ORDER — INSULIN ASPART 100 UNIT/ML IJ SOLN
0.0000 [IU] | Freq: Three times a day (TID) | INTRAMUSCULAR | Status: DC
  Administered 2023-06-07: 20 [IU] via SUBCUTANEOUS
  Administered 2023-06-07: 15 [IU] via SUBCUTANEOUS

## 2023-06-06 MED ORDER — SCOPOLAMINE 1 MG/3DAYS TD PT72
MEDICATED_PATCH | TRANSDERMAL | Status: AC
  Filled 2023-06-06: qty 1

## 2023-06-06 MED ORDER — PROPOFOL 10 MG/ML IV BOLUS
INTRAVENOUS | Status: DC | PRN
  Administered 2023-06-06: 160 mg via INTRAVENOUS

## 2023-06-06 MED ORDER — DEXAMETHASONE SODIUM PHOSPHATE 10 MG/ML IJ SOLN
INTRAMUSCULAR | Status: AC
  Filled 2023-06-06: qty 1

## 2023-06-06 MED ORDER — VITAMIN D 25 MCG (1000 UNIT) PO TABS
4000.0000 [IU] | ORAL_TABLET | Freq: Every day | ORAL | Status: DC
  Administered 2023-06-07: 4000 [IU] via ORAL
  Filled 2023-06-06: qty 4

## 2023-06-06 MED ORDER — ONDANSETRON HCL 4 MG PO TABS: 4.0000 mg | ORAL_TABLET | Freq: Four times a day (QID) | ORAL | Status: DC | PRN

## 2023-06-06 MED ORDER — LACTATED RINGERS IV SOLN: INTRAVENOUS | Status: DC | PRN

## 2023-06-06 MED ORDER — PAROXETINE HCL 10 MG PO TABS
30.0000 mg | ORAL_TABLET | Freq: Every day | ORAL | Status: DC
  Administered 2023-06-06 – 2023-06-07 (×2): 30 mg via ORAL
  Filled 2023-06-06 (×2): qty 3

## 2023-06-06 MED ORDER — MIDAZOLAM HCL 2 MG/2ML IJ SOLN
INTRAMUSCULAR | Status: AC
  Filled 2023-06-06: qty 2

## 2023-06-06 MED ORDER — AMISULPRIDE (ANTIEMETIC) 5 MG/2ML IV SOLN: 10.0000 mg | Freq: Once | INTRAVENOUS | Status: DC | PRN

## 2023-06-06 MED ORDER — BUPIVACAINE HCL (PF) 0.5 % IJ SOLN
INTRAMUSCULAR | Status: DC | PRN
  Administered 2023-06-06: 15 mL via PERINEURAL

## 2023-06-06 MED ORDER — SPIRONOLACTONE 12.5 MG HALF TABLET
12.5000 mg | ORAL_TABLET | Freq: Every day | ORAL | Status: DC
  Administered 2023-06-06 – 2023-06-07 (×2): 12.5 mg via ORAL
  Filled 2023-06-06 (×2): qty 1

## 2023-06-06 MED ORDER — METFORMIN HCL ER 500 MG PO TB24
500.0000 mg | ORAL_TABLET | Freq: Every day | ORAL | Status: DC
  Administered 2023-06-07: 500 mg via ORAL
  Filled 2023-06-06: qty 1

## 2023-06-06 MED ORDER — LIDOCAINE 2% (20 MG/ML) 5 ML SYRINGE
INTRAMUSCULAR | Status: DC | PRN
  Administered 2023-06-06: 20 mg via INTRAVENOUS

## 2023-06-06 MED ORDER — FENTANYL CITRATE (PF) 100 MCG/2ML IJ SOLN
INTRAMUSCULAR | Status: DC | PRN
  Administered 2023-06-06 (×3): 25 ug via INTRAVENOUS

## 2023-06-06 MED ORDER — CHLORHEXIDINE GLUCONATE 0.12 % MT SOLN
15.0000 mL | Freq: Once | OROMUCOSAL | Status: AC
  Administered 2023-06-06: 15 mL via OROMUCOSAL

## 2023-06-06 MED ORDER — SCOPOLAMINE 1 MG/3DAYS TD PT72SCOPOLAMINE 1 MG/3DAYS
1.0000 | MEDICATED_PATCH | TRANSDERMAL | Status: DC
Start: 2023-06-06 — End: 2023-06-06
  Administered 2023-06-06: 1 via TRANSDERMAL

## 2023-06-06 MED ORDER — OXYCODONE-ACETAMINOPHEN 5-325 MG PO TABS: 1.0000 | ORAL_TABLET | ORAL | 0 refills | Status: DC | PRN

## 2023-06-06 MED ORDER — DIGOXIN 125 MCG PO TABS
0.1250 mg | ORAL_TABLET | Freq: Every day | ORAL | Status: DC
  Administered 2023-06-06 – 2023-06-07 (×2): 0.125 mg via ORAL
  Filled 2023-06-06 (×2): qty 1

## 2023-06-06 MED ORDER — FUROSEMIDE 40 MG PO TABS
40.0000 mg | ORAL_TABLET | Freq: Every day | ORAL | Status: DC
  Filled 2023-06-06: qty 1

## 2023-06-06 MED ORDER — EPHEDRINE 5 MG/ML INJ
INTRAVENOUS | Status: AC
  Filled 2023-06-06: qty 5

## 2023-06-06 MED ORDER — INSULIN ASPART 100 UNIT/ML IJ SOLN
INTRAMUSCULAR | Status: AC
  Filled 2023-06-06: qty 1

## 2023-06-06 MED ORDER — METHOCARBAMOL 500 MG PO TABS: 500.0000 mg | ORAL_TABLET | Freq: Four times a day (QID) | ORAL | Status: DC | PRN

## 2023-06-06 MED ORDER — SODIUM CHLORIDE 0.9 % IV SOLN
3.0000 g | INTRAVENOUS | Status: AC
  Administered 2023-06-06: 3 g via INTRAVENOUS
  Filled 2023-06-06: qty 3

## 2023-06-06 MED ORDER — ACETAMINOPHEN 500 MG PO TABS
500.0000 mg | ORAL_TABLET | Freq: Two times a day (BID) | ORAL | Status: DC
  Administered 2023-06-06 – 2023-06-07 (×2): 500 mg via ORAL
  Filled 2023-06-06 (×2): qty 1

## 2023-06-06 MED ORDER — PHENYLEPHRINE 80 MCG/ML (10ML) SYRINGE FOR IV PUSH (FOR BLOOD PRESSURE SUPPORT)
PREFILLED_SYRINGE | INTRAVENOUS | Status: AC
  Filled 2023-06-06: qty 10

## 2023-06-06 MED ORDER — FENTANYL CITRATE PF 50 MCG/ML IJ SOSY
100.0000 ug | PREFILLED_SYRINGE | Freq: Once | INTRAMUSCULAR | Status: DC
  Filled 2023-06-06: qty 2

## 2023-06-06 MED ORDER — POLYETHYLENE GLYCOL 3350 17 G PO PACK: 17.0000 g | PACK | Freq: Every day | ORAL | Status: DC | PRN

## 2023-06-06 MED ORDER — LORATADINE 10 MG PO TABS
10.0000 mg | ORAL_TABLET | Freq: Every day | ORAL | Status: DC
Start: 2023-06-07 — End: 2023-06-07
  Administered 2023-06-07: 10 mg via ORAL
  Filled 2023-06-06: qty 1

## 2023-06-06 MED ORDER — SUGAMMADEX SODIUM 200 MG/2ML IV SOLN
INTRAVENOUS | Status: DC | PRN
  Administered 2023-06-06: 200 mg via INTRAVENOUS

## 2023-06-06 MED ORDER — CEFAZOLIN SODIUM-DEXTROSE 2-4 GM/100ML-% IV SOLN
2.0000 g | Freq: Four times a day (QID) | INTRAVENOUS | Status: AC
  Administered 2023-06-06 – 2023-06-07 (×2): 2 g via INTRAVENOUS
  Filled 2023-06-06 (×2): qty 100

## 2023-06-06 MED ORDER — TRANEXAMIC ACID-NACL 1000-0.7 MG/100ML-% IV SOLN
1000.0000 mg | INTRAVENOUS | Status: AC
  Administered 2023-06-06: 1000 mg via INTRAVENOUS
  Filled 2023-06-06: qty 100

## 2023-06-06 MED ORDER — OXYCODONE HCL 5 MG PO TABS
5.0000 mg | ORAL_TABLET | ORAL | Status: DC | PRN
Start: 2023-06-06 — End: 2023-06-07

## 2023-06-06 MED ORDER — METOCLOPRAMIDE HCL 5 MG/ML IJ SOLN: 5.0000 mg | Freq: Three times a day (TID) | INTRAMUSCULAR | Status: DC | PRN

## 2023-06-06 MED ORDER — BISACODYL 10 MG RE SUPP: 10.0000 mg | Freq: Every day | RECTAL | Status: DC | PRN

## 2023-06-06 MED ORDER — ROCURONIUM BROMIDE 10 MG/ML (PF) SYRINGE
PREFILLED_SYRINGE | INTRAVENOUS | Status: AC
  Filled 2023-06-06: qty 10

## 2023-06-06 MED ORDER — TRANEXAMIC ACID-NACL 1000-0.7 MG/100ML-% IV SOLN
1000.0000 mg | Freq: Once | INTRAVENOUS | Status: AC
  Administered 2023-06-06: 1000 mg via INTRAVENOUS
  Filled 2023-06-06: qty 100

## 2023-06-06 MED ORDER — PHENYLEPHRINE HCL-NACL 20-0.9 MG/250ML-% IV SOLN
INTRAVENOUS | Status: DC | PRN
  Administered 2023-06-06: 40 ug/min via INTRAVENOUS

## 2023-06-06 MED ORDER — INSULIN ASPART 100 UNIT/ML IJ SOLN
6.0000 [IU] | Freq: Three times a day (TID) | INTRAMUSCULAR | Status: DC
  Administered 2023-06-07 (×2): 6 [IU] via SUBCUTANEOUS

## 2023-06-06 MED ORDER — SODIUM CHLORIDE 0.9% FLUSH: 3.0000 mL | Freq: Two times a day (BID) | INTRAVENOUS | Status: DC

## 2023-06-06 MED ORDER — PHENYLEPHRINE 80 MCG/ML (10ML) SYRINGE FOR IV PUSH (FOR BLOOD PRESSURE SUPPORT)
PREFILLED_SYRINGE | INTRAVENOUS | Status: DC | PRN
  Administered 2023-06-06: 160 ug via INTRAVENOUS

## 2023-06-06 MED ORDER — INSULIN ASPART 100 UNIT/ML IJ SOLN
INTRAMUSCULAR | Status: AC
  Administered 2023-06-06: 15 [IU] via SUBCUTANEOUS
  Filled 2023-06-06: qty 1

## 2023-06-06 MED ORDER — 0.9 % SODIUM CHLORIDE (POUR BTL) OPTIME
TOPICAL | Status: DC | PRN
  Administered 2023-06-06: 1000 mL

## 2023-06-06 MED ORDER — MENTHOL 3 MG MT LOZG
1.0000 | LOZENGE | OROMUCOSAL | Status: DC | PRN
  Administered 2023-06-06: 3 mg via ORAL
  Filled 2023-06-06: qty 9

## 2023-06-06 MED ORDER — ATORVASTATIN CALCIUM 40 MG PO TABS
80.0000 mg | ORAL_TABLET | Freq: Every day | ORAL | Status: DC
Start: 2023-06-07 — End: 2023-06-07
  Administered 2023-06-07: 80 mg via ORAL
  Filled 2023-06-06: qty 2

## 2023-06-06 MED ORDER — ONDANSETRON HCL 4 MG/2ML IJ SOLN
INTRAMUSCULAR | Status: DC | PRN
  Administered 2023-06-06: 4 mg via INTRAVENOUS

## 2023-06-06 MED ORDER — FENTANYL CITRATE PF 50 MCG/ML IJ SOSY: 25.0000 ug | PREFILLED_SYRINGE | INTRAMUSCULAR | Status: DC | PRN

## 2023-06-06 MED ORDER — PRONTOSAN WOUND IRRIGATION OPTIME
TOPICAL | Status: DC | PRN
  Administered 2023-06-06: 1 via TOPICAL

## 2023-06-06 MED ORDER — ACETAMINOPHEN 325 MG PO TABS: 325.0000 mg | ORAL_TABLET | Freq: Four times a day (QID) | ORAL | Status: DC | PRN

## 2023-06-06 MED ORDER — TIRZEPATIDE 15 MG/0.5ML ~~LOC~~ SOAJ: 15.0000 mg | SUBCUTANEOUS | Status: DC

## 2023-06-06 MED ORDER — PROPOFOL 10 MG/ML IV BOLUS
INTRAVENOUS | Status: AC
  Filled 2023-06-06: qty 20

## 2023-06-06 MED ORDER — INSULIN ASPART 100 UNIT/ML IJ SOLN
0.0000 [IU] | INTRAMUSCULAR | Status: DC | PRN
Start: 2023-06-06 — End: 2023-06-06

## 2023-06-06 MED ORDER — BUPIVACAINE-EPINEPHRINE 0.25% -1:200000 IJ SOLN
INTRAMUSCULAR | Status: DC | PRN
  Administered 2023-06-06: 20 mL

## 2023-06-06 MED ORDER — HYDROMORPHONE HCL 1 MG/ML IJ SOLN
0.5000 mg | INTRAMUSCULAR | Status: DC | PRN
Start: 2023-06-06 — End: 2023-06-07

## 2023-06-06 MED ORDER — FLUTICASONE PROPIONATE 0.05 % EX CREA: 1.0000 | TOPICAL_CREAM | Freq: Every day | CUTANEOUS | Status: DC | PRN

## 2023-06-06 MED ORDER — DOCUSATE SODIUM 100 MG PO CAPS
100.0000 mg | ORAL_CAPSULE | Freq: Two times a day (BID) | ORAL | Status: DC
  Administered 2023-06-06 – 2023-06-07 (×2): 100 mg via ORAL
  Filled 2023-06-06 (×2): qty 1

## 2023-06-06 MED ORDER — ONDANSETRON HCL 4 MG/2ML IJ SOLN
INTRAMUSCULAR | Status: AC
  Filled 2023-06-06: qty 2

## 2023-06-06 MED ORDER — MIDAZOLAM HCL 2 MG/2ML IJ SOLN
INTRAMUSCULAR | Status: DC | PRN
  Administered 2023-06-06: 2 mg via INTRAVENOUS

## 2023-06-06 MED ORDER — ORAL CARE MOUTH RINSE: 15.0000 mL | Freq: Once | OROMUCOSAL | Status: AC

## 2023-06-06 MED ORDER — METOCLOPRAMIDE HCL 5 MG PO TABS: 5.0000 mg | ORAL_TABLET | Freq: Three times a day (TID) | ORAL | Status: DC | PRN

## 2023-06-06 MED ORDER — SACUBITRIL-VALSARTAN 97-103 MG PO TABS
1.0000 | ORAL_TABLET | Freq: Two times a day (BID) | ORAL | Status: DC
  Administered 2023-06-06 – 2023-06-07 (×2): 1 via ORAL
  Filled 2023-06-06 (×2): qty 1

## 2023-06-06 MED ORDER — INSULIN ASPART 100 UNIT/ML IJ SOLN: 0.0000 [IU] | Freq: Three times a day (TID) | INTRAMUSCULAR | Status: DC

## 2023-06-06 MED ORDER — LEVOTHYROXINE SODIUM 25 MCG PO TABS
25.0000 ug | ORAL_TABLET | Freq: Every day | ORAL | Status: DC
  Administered 2023-06-07: 25 ug via ORAL
  Filled 2023-06-06: qty 1

## 2023-06-06 MED ORDER — EPHEDRINE SULFATE-NACL 50-0.9 MG/10ML-% IV SOSY
PREFILLED_SYRINGE | INTRAVENOUS | Status: DC | PRN
  Administered 2023-06-06 (×3): 10 mg via INTRAVENOUS

## 2023-06-06 MED ORDER — MIDAZOLAM HCL 2 MG/2ML IJ SOLN
2.0000 mg | Freq: Once | INTRAMUSCULAR | Status: DC
Start: 2023-06-06 — End: 2023-06-06
  Filled 2023-06-06: qty 2

## 2023-06-06 MED ORDER — SODIUM CHLORIDE 0.9% FLUSH: 3.0000 mL | INTRAVENOUS | Status: DC | PRN

## 2023-06-06 MED ORDER — PHENOL 1.4 % MT LIQD: 1.0000 | OROMUCOSAL | Status: DC | PRN

## 2023-06-06 MED ORDER — ONDANSETRON HCL 4 MG/2ML IJ SOLN: 4.0000 mg | Freq: Four times a day (QID) | INTRAMUSCULAR | Status: DC | PRN

## 2023-06-06 MED ORDER — BUPIVACAINE LIPOSOME 1.3 % IJ SUSP
INTRAMUSCULAR | Status: DC | PRN
  Administered 2023-06-06: 10 mL via PERINEURAL

## 2023-06-06 MED ORDER — DEXAMETHASONE SODIUM PHOSPHATE 10 MG/ML IJ SOLN
INTRAMUSCULAR | Status: DC | PRN
  Administered 2023-06-06: 5 mg via INTRAVENOUS

## 2023-06-06 MED ORDER — INSULIN GLARGINE-YFGN 100 UNIT/ML ~~LOC~~ SOLN
10.0000 [IU] | Freq: Every day | SUBCUTANEOUS | Status: DC
  Administered 2023-06-06: 10 [IU] via SUBCUTANEOUS
  Filled 2023-06-06 (×2): qty 0.1

## 2023-06-06 SURGICAL SUPPLY — 81 items
BAG COUNTER SPONGE SURGICOUNT (BAG) ×2 IMPLANT
BIT DRILL 170X2.5X (BIT) IMPLANT
BIT DRL 170X2.5X (BIT) ×1 IMPLANT
BLADE FLEX CHISEL 8 2.5 (MISCELLANEOUS) IMPLANT
BOWL SMART MIX CTS (DISPOSABLE) IMPLANT
BRUSH FEMORAL CANAL (MISCELLANEOUS) IMPLANT
BUR SURG 4X8 MED (BURR) IMPLANT
BURR SURG 4X8 MED (BURR) IMPLANT
CNTNR URN SCR LID CUP LEK RST (MISCELLANEOUS) IMPLANT
COVER BACK TABLE 60X90IN (DRAPES) IMPLANT
COVER SURGICAL LIGHT HANDLE (MISCELLANEOUS) ×2 IMPLANT
CUP HUM MOD SHLD 1 155D LT (Cup) IMPLANT
CUP STAND PE 42 PLUS 9MM (Orthopedic Implant) ×1 IMPLANT
CUP STD PE 42 PLUS 9MM (Orthopedic Implant) IMPLANT
DRAPE INCISE IOBAN 66X45 STRL (DRAPES) ×6 IMPLANT
DRAPE SHEET LG 3/4 BI-LAMINATE (DRAPES) IMPLANT
DRAPE SURG ORHT 6 SPLT 77X108 (DRAPES) ×4 IMPLANT
DRAPE TOP 10253 STERILE (DRAPES) IMPLANT
DRAPE U-SHAPE 47X51 STRL (DRAPES) ×2 IMPLANT
DRSG ADAPTIC 3X8 NADH LF (GAUZE/BANDAGES/DRESSINGS) ×2 IMPLANT
DURAPREP 26ML APPLICATOR (WOUND CARE) ×2 IMPLANT
ELECT BLADE TIP CTD 4 INCH (ELECTRODE) ×2 IMPLANT
ELECT NDL TIP 2.8 STRL (NEEDLE) ×2 IMPLANT
ELECT NEEDLE TIP 2.8 STRL (NEEDLE) ×1 IMPLANT
ELECT REM PT RETURN 15FT ADLT (MISCELLANEOUS) ×2 IMPLANT
GAUZE PAD ABD 8X10 STRL (GAUZE/BANDAGES/DRESSINGS) ×4 IMPLANT
GAUZE SPONGE 4X4 12PLY STRL (GAUZE/BANDAGES/DRESSINGS) ×2 IMPLANT
GLENOSPHERE XTEND LAT 42+0 STD (Miscellaneous) IMPLANT
GLOVE BIOGEL PI IND STRL 7.5 (GLOVE) IMPLANT
GLOVE BIOGEL PI IND STRL 8.5 (GLOVE) IMPLANT
GLOVE ORTHO TXT STRL SZ7.5 (GLOVE) ×4 IMPLANT
GLOVE PI ORTHO PRO STRL 7.5 (GLOVE) ×4 IMPLANT
GLOVE PI ORTHO PRO STRL SZ8 (GLOVE) ×2 IMPLANT
GLOVE SURG ORTHO 8.5 STRL (GLOVE) ×4 IMPLANT
GOWN STRL REUS W/ TWL LRG LVL3 (GOWN DISPOSABLE) ×2 IMPLANT
GOWN STRL REUS W/ TWL XL LVL3 (GOWN DISPOSABLE) ×8 IMPLANT
HUMERAL SPACER (Trauma) ×1 IMPLANT
KIT BASIN OR (CUSTOM PROCEDURE TRAY) ×2 IMPLANT
KIT TURNOVER KIT A (KITS) ×2 IMPLANT
MANIFOLD NEPTUNE II (INSTRUMENTS) ×2 IMPLANT
METAGLENE DELTA EXTEND (Trauma) IMPLANT
METAGLENE DXTEND (Trauma) ×1 IMPLANT
NDL HYPO 25X1 1.5 SAFETY (NEEDLE) ×2 IMPLANT
NDL MA TROC 1/2 CIR (NEEDLE) IMPLANT
NDL SUT 6 .5 CRC .975X.05 MAYO (NEEDLE) ×2 IMPLANT
NEEDLE HYPO 25X1 1.5 SAFETY (NEEDLE) ×1 IMPLANT
NEEDLE MA TROC 1/2 CIR (NEEDLE) IMPLANT
NS IRRIG 1000ML POUR BTL (IV SOLUTION) ×2 IMPLANT
PACK SHOULDER (CUSTOM PROCEDURE TRAY) ×2 IMPLANT
PAD ARMBOARD 7.5X6 YLW CONV (MISCELLANEOUS) ×4 IMPLANT
PIN GUIDE 1.2 (PIN) IMPLANT
PIN GUIDE GLENOPHERE 1.5MX300M (PIN) IMPLANT
PIN METAGLENE 2.5 (PIN) IMPLANT
RESTRAINT HEAD UNIVERSAL NS (MISCELLANEOUS) ×2 IMPLANT
SCREW 4.5X36MM (Screw) IMPLANT
SCREW BN 18X4.5XSTRL SHLDR (Screw) IMPLANT
SCREW LOCK 42 (Screw) IMPLANT
SCREW LOCK DELTA XTEND 4.5X30 (Screw) IMPLANT
SLING ARM IMMOBILIZER LRG (SOFTGOODS) ×2 IMPLANT
SLING ARM IMMOBILIZER MED (SOFTGOODS) IMPLANT
SPACER HUMERAL (Trauma) IMPLANT
SPONGE T-LAP 4X18 ~~LOC~~+RFID (SPONGE) ×2 IMPLANT
STRIP CLOSURE SKIN 1/2X4 (GAUZE/BANDAGES/DRESSINGS) ×2 IMPLANT
SUCTION TUBE FRAZIER 10FR DISP (SUCTIONS) ×2 IMPLANT
SUT FIBERWIRE #2 38 T-5 BLUE (SUTURE) ×3 IMPLANT
SUT MNCRL AB 4-0 PS2 18 (SUTURE) ×2 IMPLANT
SUT VIC AB 0 CT1 27XBRD ANBCTR (SUTURE) ×2 IMPLANT
SUT VIC AB 2-0 CT1 TAPERPNT 27 (SUTURE) ×2 IMPLANT
SUT VICRYL 2 0 18 UND BR (SUTURE) ×2 IMPLANT
SUTURE FIBERWR #2 38 T-5 BLUE (SUTURE) ×10 IMPLANT
SWAB CULTURE ESWAB REG 1ML (MISCELLANEOUS) IMPLANT
SYR CONTROL 10ML LL (SYRINGE) ×2 IMPLANT
SYR TOOMEY IRRIG 70ML (MISCELLANEOUS) IMPLANT
SYRINGE TOOMEY IRRIG 70ML (MISCELLANEOUS) ×2 IMPLANT
TOWEL GREEN STERILE FF (TOWEL DISPOSABLE) IMPLANT
TOWEL OR 17X26 10 PK STRL BLUE (TOWEL DISPOSABLE) ×2 IMPLANT
TOWER CARTRIDGE SMART MIX (DISPOSABLE) IMPLANT
TRAY FOLEY MTR SLVR 16FR STAT (SET/KITS/TRAYS/PACK) ×2 IMPLANT
TUBE SUCTION HIGH CAP CLEAR NV (SUCTIONS) IMPLANT
WATER STERILE IRR 1000ML POUR (IV SOLUTION) ×2 IMPLANT
YANKAUER SUCT BULB TIP NO VENT (SUCTIONS) IMPLANT

## 2023-06-06 NOTE — Interval H&P Note (Signed)
 History and Physical Interval Note:  06/06/2023 12:56 PM  Gregory [name]  has presented today for surgery, with the diagnosis of Failed left total shoulder arthroplasty.  The various methods of treatment have been discussed with the patient and family. After consideration of risks, benefits and other options for treatment, the patient has consented to  Procedure(s) with comments: SHOULDER ARTHROPLASTY REVISION (Left) - with interscalene block as a surgical intervention.  The patient's history has been reviewed, patient examined, no change in status, stable for surgery.  I have reviewed the patient's chart and labs.  Questions were answered to the patient's satisfaction.     [name]

## 2023-06-06 NOTE — Anesthesia Procedure Notes (Signed)
 Arterial Line Insertion Start/End1/02/2024 1:28 PM, 06/06/2023 1:38 PM Performed by: Epifanio Charleston, MD, anesthesiologist  Patient location: Pre-op . Preanesthetic checklist: patient identified, IV checked, site marked, risks and benefits discussed, surgical consent, monitors and equipment checked, pre-op  evaluation, timeout performed and anesthesia consent Lidocaine  1% used for infiltration Right, brachial was placed Catheter size: 20 G Hand hygiene performed  and maximum sterile barriers used   Attempts: 1 Procedure performed using ultrasound guided technique. Ultrasound Notes:anatomy identified, needle tip was noted to be adjacent to the nerve/plexus identified, no ultrasound evidence of intravascular and/or intraneural injection and image(s) printed for medical record Following insertion, dressing applied and Biopatch. Post procedure assessment: normal and unchanged  Patient tolerated the procedure well with no immediate complications.

## 2023-06-06 NOTE — Discharge Instructions (Signed)
 Ice to the shoulder constantly.  Keep the incision covered and clean and dry for one week, then ok to get it wet in the shower.   Do gentle exercise as instructed several times per day.  DO NOT reach behind your back or push up out of a chair with the operative arm. Keep your arm in front of you. If you can see your elbow, you are doing well.   Use a sling while you are up and around for comfort, may remove while seated.  Keep pillow propped behind the operative elbow and the arm supported  Follow up with Dr [name] in two weeks in the office, call [phone] for appt  Please call Dr [name] (cell) [phone] with any questions or concerns

## 2023-06-06 NOTE — Anesthesia Procedure Notes (Signed)
 Anesthesia Regional Block: Interscalene brachial plexus block   Pre-Anesthetic Checklist: , timeout performed,  Correct Patient, Correct Site, Correct Laterality,  Correct Procedure, Correct Position, site marked,  Risks and benefits discussed,  Surgical consent,  Pre-op  evaluation,  At surgeon's request and post-op pain management  Laterality: Left  Prep: chloraprep       Needles:  Injection technique: Single-shot  Needle Type: Echogenic Needle     Needle Length: 9cm  Needle Gauge: 21     Additional Needles:   Procedures:, nerve stimulator,,, ultrasound used (permanent image in chart),,     Nerve Stimulator or Paresthesia:  Response: deltoid, 0.5 mA  Additional Responses:   Narrative:  Start time: 06/06/2023 1:50 PM End time: 06/06/2023 1:55 PM Injection made incrementally with aspirations every 5 mL.  Performed by: Personally  Anesthesiologist: Epifanio Charleston, MD

## 2023-06-06 NOTE — Anesthesia Procedure Notes (Signed)
 Procedure Name: Intubation Date/Time: 06/06/2023 2:57 PM  Performed by: Anabell Swint, Corean BROCKS, CRNAPre-anesthesia Checklist: Patient identified, Emergency Drugs available, Suction available and Patient being monitored Patient Re-evaluated:Patient Re-evaluated prior to induction Oxygen Delivery Method: Circle system utilized Preoxygenation: Pre-oxygenation with 100% oxygen Induction Type: IV induction Ventilation: Mask ventilation without difficulty Laryngoscope Size: Mac and 4 Grade View: Grade III Tube type: Oral Tube size: 7.5 mm Number of attempts: 1 Airway Equipment and Method: Stylet and Oral airway Placement Confirmation: ETT inserted through vocal cords under direct vision, positive ETCO2 and breath sounds checked- equal and bilateral Secured at: 24 cm Tube secured with: Tape Dental Injury: Teeth and Oropharynx as per pre-operative assessment

## 2023-06-06 NOTE — Op Note (Signed)
 NAME: Gregory [name], DEAVERS MEDICAL RECORD NO: [id number] ACCOUNT NO: [id number] DATE OF BIRTH: 1961/02/19 FACILITY: [facility] LOCATION: [id number] PHYSICIAN: Elspeth SAUNDERS. 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  PROCEDURES PERFORMED: 1.  Left shoulder revision total shoulder arthroplasty. 2.  Reverse TSA utilizing DePuy Delta Xtend prosthesis.  ATTENDING SURGEON:  Elspeth SAUNDERS. [name], MD  ASSISTANT:  Debby Crock [place], [place], who was scrubbed during the entire procedure, and necessary for satisfactory completion of surgery.  ANESTHESIA:  General anesthesia was used plus interscalene block.  ESTIMATED BLOOD LOSS:  400 mL.  FLUID REPLACEMENT:  1,500 mL of crystalloids.  COUNTS:  Correct.  COMPLICATIONS:  None.  ANTIBIOTICS:  Perioperative antibiotics were given.  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  The patient had an anatomic total shoulder arthroplasty about 8 years ago and did well until recently.  The patient  reported increasing pain and x-rays showed displacement of his x-ray marker for the glenoid component indicating that that component was loose.  CT imaging and other x-rays did not show any evidence of humeral component loosening or overt infection.   Preoperative laboratory workup is negative for infection.  We discussed options with the patient recommending that we take him to surgery for revision, potentially just revising the glenoid component, but potentially revising to a reverse total shoulder  arthroplasty but inspecting to make sure that he did not have any evidence of deep infection with frozen section.  The patient agreed. Informed consent obtained.  DESCRIPTION  OF PROCEDURE:  After an adequate level of anesthesia was achieved, the patient was positioned in the modified beach chair position.  The left shoulder was correctly identified and sterile prep and drape were performed. Timeout called  verifying correct patient, correct site.  We entered the patient's shoulder using the previous deltopectoral incision starting at the coracoid process and extending down to the anterior humerus.  Dissection was carried out down through subcutaneous  tissues using Bovie.  The cephalic vein was identified and taken laterally with the deltoid.  The conjoint tendon was identified and retracted medially.  We identified the subscapularis, which was fairly lax, but intact. I went ahead and entered through  the rotator interval and clear fluid was expressed.  We went ahead and sampled that for Gram stain and aerobic and anaerobic cultures. We then removed the subscapularis off the lesser tuberosity.  This was fairly scarred and it just did not seem like  normal tendon or normal muscle and thus we also retrieved the glenoid component which was in the anterior aspect of the shoulder.  This showed evidence of significant wear and fracturing and thinning, indicating eccentric wear and probable rotator cuff  insufficiency.  Based on this, our decision was to move forward with revision to reverse total shoulder arthroplasty to create stability and ensure a good result.  We went ahead and took deep synovial tissue specimens from the shoulder and sent that to  pathology in saline for frozen section.  The pathologist called us  back and informed us  that we had less than 3 white blood cells per high-power field indicating low risk for infection. We went ahead at this point and did a synovectomy.  We then prepped  the glenoid, getting all scar  tissue away and getting good exposure.  With our deep retractors placed, we placed our guide pin, centered low on the glenoid. We then reamed to subchondral  bone with our Metaglene reamer.  We then did peripheral hand  reaming with a T-handle reamer and then drilled out our central peg hole. Next, we impacted the HA-coated Press-Fit baseplate into position.  We placed a 36 screw inferiorly, a 36 screw superiorly, and an 18 nonlocked screw posteriorly.  We had good  baseplate, bony support, and also good screw purchase. At this point, we selected a 42 +0 standard glenosphere and attached that to the baseplate with a screwdriver. I did a finger sweep to make sure we had no soft tissue caught up in that bearing.  We  went back to the humeral side and initially tried to remove the entire stem so we could go in with the Delta stem, but despite using flexible osteotomes and our best attempts at removing the stem using the broach handle, we were not able to loosen the  stem up. I then went ahead and decided that we were going to utilize the modular stem on the [facility] and so, we removed the epiphyseal portion with some difficulty proximally.  I went ahead and used the screwdriver to remove the set screw.  I backed  that out and then we were able to get the metaphysis portion out.  With that out, we placed the guide for the reamer and we reamed for a 1 left metaphysis down to the hard stop on the implant.  We then took the trial 1 left epi and attached that to the  stem in about 30 degrees of retroversion.  With that in position, we realized that the epi sits down low on this Global Unite stem and is definitely more distalized, more than we were used to with the standard delta reverse prosthesis. So we had to build  up in order to enhance the tension on the muscles and to create stability. We went ahead and built up to a 9 spacer and then a 9 poly.  So with that in place, and that was a +1.8 cm length on the humeral side, but we were able to create appropriate  tension in the soft tissues including the conjoint tendon and the rotator cuff musculature and that  created good stability in the shoulder. So, we removed all the trial components. We selected the real 1 left epi, attached that in 30 degrees of  retroversion and seated that set screw.  Next, we went ahead and selected the real +9 metal spacer and impacted that on the humeral tray and then we trialed again with the 42 +9 poly and that did provide good stability, so we selected the real 42 +9 poly  and packed it on the humeral tray and reduced the shoulder. Appropriate conjoint tensioning. Everything moving together as a unit.  No distraction with inferior pole at any angle and no impingement.  We irrigated thoroughly. We used some antibiotic  irrigation and then we went ahead and resected the subscapularis remnant and then irrigated again and then repaired the deltopectoral interval with #0 Vicryl suture followed by 2-0 Vicryl subcutaneous closure and staples for skin.  Sterile dressing  applied followed by shoulder sling.  The patient was transported to the recovery room in stable condition.   MUK D: 06/06/2023 6:36:07 pm T: 06/06/2023 9:03:00 pm  JOB: [id number]/ [id number]

## 2023-06-06 NOTE — Interval H&P Note (Signed)
 History and Physical Interval Note:  06/06/2023 12:48 PM  Gregory [name]  has presented today for surgery, with the diagnosis of Failed left total shoulder arthroplasty.  The various methods of treatment have been discussed with the patient and family. After consideration of risks, benefits and other options for treatment, the patient has consented to  Procedure(s) with comments: SHOULDER ARTHROPLASTY REVISION (Left) - with interscalene block as a surgical intervention.  The patient's history has been reviewed, patient examined, no change in status, stable for surgery.  I have reviewed the patient's chart and labs.  Questions were answered to the patient's satisfaction.     [name]

## 2023-06-06 NOTE — Progress Notes (Signed)
 Call placed to [employer] to have rep return call to clarify arrival for pts surgical procedure.

## 2023-06-06 NOTE — Brief Op Note (Signed)
 06/06/2023  6:25 PM  PATIENT:  Gregory [name]  63 y.o. male  PRE-OPERATIVE DIAGNOSIS:  Failed left total shoulder arthroplasty  POST-OPERATIVE DIAGNOSIS:  Failed left total shoulder arthroplasty  PROCEDURE:  Procedure(s) with comments: SHOULDER ARTHROPLASTY REVISION (Left) - with interscalene block conversion to Reverse Total Shoulder Arthroplasty DePuy Delta Xtend with Global Unite Stem  SURGEON:  Surgeons and Role:    DEWAINE [name] Kemps, MD - Primary  PHYSICIAN ASSISTANT:   ASSISTANTS: Debby KATHEE Fireman, PA-C   ANESTHESIA:   regional and general  EBL:  400 mL   BLOOD ADMINISTERED:none  DRAINS: none   LOCAL MEDICATIONS USED:  MARCAINE      SPECIMEN:  Source of Specimen:  Deep fluid sample from the shoulder to Micro  Tissue to Path  DISPOSITION OF SPECIMEN:   Micro  and Pathology for frozen section  COUNTS:  YES  TOURNIQUET:  * No tourniquets in log *  DICTATION: .Other Dictation: Dictation Number [id number]  PLAN OF CARE: Admit for overnight observation  PATIENT DISPOSITION:  PACU - hemodynamically stable.   Delay start of Pharmacological VTE agent (>24hrs) due to surgical blood loss or risk of bleeding: not applicable

## 2023-06-07 DIAGNOSIS — I509 Heart failure, unspecified: Secondary | ICD-10-CM | POA: Diagnosis not present

## 2023-06-07 DIAGNOSIS — T84098A Other mechanical complication of other internal joint prosthesis, initial encounter: Secondary | ICD-10-CM | POA: Diagnosis not present

## 2023-06-07 DIAGNOSIS — E039 Hypothyroidism, unspecified: Secondary | ICD-10-CM | POA: Diagnosis not present

## 2023-06-07 DIAGNOSIS — Z96612 Presence of left artificial shoulder joint: Secondary | ICD-10-CM | POA: Diagnosis not present

## 2023-06-07 DIAGNOSIS — Z79899 Other long term (current) drug therapy: Secondary | ICD-10-CM | POA: Diagnosis not present

## 2023-06-07 DIAGNOSIS — E119 Type 2 diabetes mellitus without complications: Secondary | ICD-10-CM | POA: Diagnosis not present

## 2023-06-07 DIAGNOSIS — Z7984 Long term (current) use of oral hypoglycemic drugs: Secondary | ICD-10-CM | POA: Diagnosis not present

## 2023-06-07 DIAGNOSIS — Z85528 Personal history of other malignant neoplasm of kidney: Secondary | ICD-10-CM | POA: Diagnosis not present

## 2023-06-07 DIAGNOSIS — Y828 Other medical devices associated with adverse incidents: Secondary | ICD-10-CM | POA: Diagnosis not present

## 2023-06-07 DIAGNOSIS — Z7901 Long term (current) use of anticoagulants: Secondary | ICD-10-CM | POA: Diagnosis not present

## 2023-06-07 DIAGNOSIS — I110 Hypertensive heart disease with heart failure: Secondary | ICD-10-CM | POA: Diagnosis not present

## 2023-06-07 DIAGNOSIS — Z794 Long term (current) use of insulin: Secondary | ICD-10-CM | POA: Diagnosis not present

## 2023-06-07 LAB — HEMOGLOBIN AND HEMATOCRIT, BLOOD
HCT: 39.6 % (ref 39.0–52.0)
Hemoglobin: 12.8 g/dL — ABNORMAL LOW (ref 13.0–17.0)

## 2023-06-07 LAB — BASIC METABOLIC PANEL
Anion gap: 10 (ref 5–15)
BUN: 24 mg/dL — ABNORMAL HIGH (ref 8–23)
CO2: 24 mmol/L (ref 22–32)
Calcium: 8.4 mg/dL — ABNORMAL LOW (ref 8.9–10.3)
Chloride: 102 mmol/L (ref 98–111)
Creatinine, Ser: 1.14 mg/dL (ref 0.61–1.24)
GFR, Estimated: >60 mL/min (ref >60)
Glucose, Bld: 310 mg/dL — ABNORMAL HIGH (ref 70–99)
Potassium: 4.9 mmol/L (ref 3.5–5.1)
Sodium: 136 mmol/L (ref 135–145)

## 2023-06-07 LAB — GLUCOSE, CAPILLARY
Glucose-Capillary: 384 mg/dL — ABNORMAL HIGH (ref 70–99)
Glucose-Capillary: 348 mg/dL — ABNORMAL HIGH (ref 70–99)

## 2023-06-07 NOTE — Plan of Care (Signed)
  Problem: Education: Goal: Knowledge of General Education information will improve Description: Including pain rating scale, medication(s)/side effects and non-pharmacologic comfort measures Outcome: Progressing   Problem: Health Behavior/Discharge Planning: Goal: Ability to manage health-related needs will improve Outcome: Adequate for Discharge   Problem: Clinical Measurements: Goal: Ability to maintain clinical measurements within normal limits will improve Outcome: Progressing Goal: Will remain free from infection Outcome: Progressing Goal: Diagnostic test results will improve Outcome: Progressing Goal: Respiratory complications will improve Outcome: Progressing Goal: Cardiovascular complication will be avoided Outcome: Progressing   Problem: Activity: Goal: Risk for activity intolerance will decrease Outcome: Progressing   Problem: Nutrition: Goal: Adequate nutrition will be maintained Outcome: Progressing   Problem: Coping: Goal: Level of anxiety will decrease Outcome: Progressing   Problem: Elimination: Goal: Will not experience complications related to bowel motility Outcome: Progressing Goal: Will not experience complications related to urinary retention Outcome: Completed/Met   Problem: Pain Management: Goal: General experience of comfort will improve Outcome: Progressing   Problem: Safety: Goal: Ability to remain free from injury will improve Outcome: Progressing   Problem: Skin Integrity: Goal: Risk for impaired skin integrity will decrease Outcome: Progressing   Problem: Education: Goal: Individualized Educational Video(s) Outcome: Completed/Met   Problem: Health Behavior/Discharge Planning: Goal: Ability to manage health-related needs will improve Outcome: Adequate for Discharge   Problem: Nutritional: Goal: Maintenance of adequate nutrition will improve Outcome: Adequate for Discharge Goal: Progress toward achieving an optimal weight will  improve Outcome: Adequate for Discharge   Problem: Skin Integrity: Goal: Risk for impaired skin integrity will decrease Outcome: Completed/Met   Problem: Tissue Perfusion: Goal: Adequacy of tissue perfusion will improve Outcome: Completed/Met   Problem: Education: Goal: Knowledge of the prescribed therapeutic regimen will improve Outcome: Progressing Goal: Understanding of activity limitations/precautions following surgery will improve Outcome: Progressing Goal: Individualized Educational Video(s) Outcome: Completed/Met   Problem: Activity: Goal: Ability to tolerate increased activity will improve Outcome: Progressing   Problem: Pain Management: Goal: Pain level will decrease with appropriate interventions Outcome: Adequate for Discharge

## 2023-06-07 NOTE — Evaluation (Signed)
 Occupational Therapy Evaluation Patient Details Name: Gregory [name] MRN: [id number] DOB: March 16, 1961 Today's Date: 06/07/2023   History of Present Illness Pt is a 63 y.o. male complaining of left shoulder pain for multiple years. Pt is now s/p Left shoulder revision total shoulder arthroplasty. PMH includes: renal cell cancer, DM, CHF, left total shoulder arthroplasty 2016.   Clinical Impression   Pt admitted with the above diagnoses and presents with below problem list. Pt will benefit from continued acute OT to address the below listed deficits and maximize independence with basic ADLs prior to d/c home. At baseline, pt is independent with ADLs. Pt currently needs supervision with functional transfers/mobility, setup to min A with UB/LB bathing dressing. Discussed shoulder precautions, compensatory strategies for ADLs, and sling management. Issued shoulder handouts.         If plan is discharge home, recommend the following: A little help with bathing/dressing/bathroom;Assistance with cooking/housework;Assist for transportation    Functional Status Assessment  Patient has had a recent decline in their functional status and demonstrates the ability to make significant improvements in function in a reasonable and predictable amount of time.  Equipment Recommendations  None recommended by OT    Recommendations for Other Services       Precautions / Restrictions Precautions Precautions: Shoulder Shoulder Interventions: Shoulder sling/immobilizer;At all times;Off for dressing/bathing/exercises Precaution Booklet Issued: Yes (comment) Required Braces or Orthoses: Sling Restrictions Weight Bearing Restrictions Per Provider Order: Yes LUE Weight Bearing Per Provider Order: Non weight bearing      Mobility Bed Mobility               General bed mobility comments: pt up in chair    Transfers Overall transfer level: Needs assistance Equipment used: None Transfers: Sit to/from  Stand Sit to Stand: Supervision           General transfer comment: to/from recliner      Balance Overall balance assessment: No apparent balance deficits (not formally assessed)                                         ADL either performed or assessed with clinical judgement   ADL Overall ADL's : Needs assistance/impaired Eating/Feeding: Set up;Sitting   Grooming: Set up;Sitting   Upper Body Bathing: Minimal assistance;Sitting;Set up   Lower Body Bathing: Minimal assistance;Set up;Sit to/from stand   Upper Body Dressing : Set up;Minimal assistance;Sitting   Lower Body Dressing: Set up;Minimal assistance;Sit to/from stand   Toilet Transfer: Supervision/safety;Ambulation   Toileting- Clothing Manipulation and Hygiene: Contact guard assist;Sit to/from stand   Tub/ Shower Transfer: Walk-in shower;Supervision/safety;Ambulation;Shower seat   Functional mobility during ADLs: Supervision/safety General ADL Comments: pt walked entire unit, supervision for safety     Vision         Perception         Praxis         Pertinent Vitals/Pain Pain Assessment Pain Assessment: No/denies pain (pt reports nerve block still in effect)     Extremity/Trunk Assessment Upper Extremity Assessment Upper Extremity Assessment: Right hand dominant;LUE deficits/detail LUE Deficits / Details: s/p shoulder surgery. nerve block in effect and shoulder immobilized.   Lower Extremity Assessment Lower Extremity Assessment: Overall WFL for tasks assessed   Cervical / Trunk Assessment Cervical / Trunk Assessment: Normal   Communication Communication Communication: No apparent difficulties   Cognition Arousal: Alert Behavior During Therapy: [facility] for tasks  assessed/performed Overall Cognitive Status: Within Functional Limits for tasks assessed                                       General Comments       Exercises     Shoulder Instructions       Home Living Family/patient expects to be discharged to:: Private residence Living Arrangements: Spouse/significant other Available Help at Discharge: Family Type of Home: House Home Access: Stairs to enter [employer] of Steps: 1   Home Layout: Two level;Able to live on main level with bedroom/bathroom     Bathroom Shower/Tub: Walk-in shower         Home Equipment: Shower seat;Grab bars - tub/shower          Prior Functioning/Environment Prior Level of Function : Independent/Modified Independent                        OT Problem List: Impaired balance (sitting and/or standing);Decreased knowledge of use of DME or AE;Decreased knowledge of precautions;Impaired UE functional use;Pain      OT Treatment/Interventions: Self-care/ADL training;Therapeutic exercise;DME and/or AE instruction;Therapeutic activities;Patient/family education;Balance training    OT Goals(Current goals can be found in the care plan section) Acute Rehab OT Goals Patient Stated Goal: home, independence, good recovery OT Goal Formulation: With patient Time For Goal Achievement: 06/21/23 Potential to Achieve Goals: Good  OT Frequency: Min 2X/week    Co-evaluation              AM-PAC OT 6 Clicks Daily Activity     Outcome Measure Help from another person eating meals?: None Help from another person taking care of personal grooming?: None Help from another person toileting, which includes using toliet, bedpan, or urinal?: A Little Help from another person bathing (including washing, rinsing, drying)?: A Little Help from another person to put on and taking off regular upper body clothing?: A Little Help from another person to put on and taking off regular lower body clothing?: A Little 6 Click Score: 20   End of Session Nurse Communication: Mobility status  Activity Tolerance: Patient tolerated treatment well Patient left: in chair;with call bell/phone within  reach  OT Visit Diagnosis: Unsteadiness on feet (R26.81);Pain                Time: 8975-8955 OT Time Calculation (min): 20 min Charges:  OT General Charges $OT Visit: 1 Visit OT Evaluation $OT Eval Low Complexity: 1 Low  Lamarr Hoots, OT Acute Rehabilitation Services Office: [phone]   Hoots Lamarr DEL 06/07/2023, 10:57 AM

## 2023-06-07 NOTE — Progress Notes (Signed)
 Subjective: 1 Day Post-Op Procedure(s) (LRB): SHOULDER ARTHROPLASTY REVISION (Left)  Patient reports pain as appropriately controlled. Denies any new numbness/tingling.   Objective:   VITALS:  Temp:  [97.7 F (36.5 C)-99.1 F (37.3 C)] 98.5 F (36.9 C) (01/11 0604) Pulse Rate:  [67-88] 82 (01/11 0822) Resp:  [4-18] 18 (01/11 0604) BP: (111-184)/(63-97) 115/66 (01/11 0604) SpO2:  [91 %-100 %] 95 % (01/11 0604) Arterial Line BP: (128)/(70) 128/70 (01/10 1825) Weight:  [123.8 kg] 123.8 kg (01/10 1152)  Gen: AAOx3, NAD Comfortable at rest  Left Upper Extremity: Incision c/d/i AIN/PIN/Ulnar intact SILT throughout Radial, Ulnar + to palp CR < 2s    LABS Recent Labs    06/07/23 0415  HGB 12.8*   Recent Labs    06/07/23 0415  NA 136  K 4.9  CL 102  CO2 24  BUN 24*  CREATININE 1.14  GLUCOSE 310*   No results for input(s): LABPT, INR in the last 72 hours.   Assessment/Plan: 1 Day Post-Op Procedure(s) (LRB): SHOULDER ARTHROPLASTY REVISION (Left)  Advance diet Up with therapy D/C IV fluids Discharge home with home health  Gregory [name] 06/07/2023, 8:41 AM

## 2023-06-08 NOTE — Transfer of Care (Addendum)
 Immediate Anesthesia Transfer of Care Note  Patient: Gregory [name]  Procedure(s) Performed: SHOULDER ARTHROPLASTY REVISION (Left: Shoulder)  Patient Location: PACU  Anesthesia Type:General  Level of Consciousness: sedated  Airway & Oxygen Therapy: Patient Spontanous Breathing  Post-op Assessment: Report given to RN and Post -op Vital signs reviewed and stable  Post vital signs: Reviewed and stable  Last Vitals:  Vitals Value Taken Time  BP 102/61 06/07/23 1314  Temp 37.1 C 06/07/23 1314  Pulse 83 06/07/23 1314  Resp 16 06/07/23 1314  SpO2 96 % 06/07/23 1314    Last Pain:  Vitals:   06/07/23 1314  TempSrc: Oral  PainSc:          Complications: No notable events documented.

## 2023-06-08 NOTE — Anesthesia Postprocedure Evaluation (Signed)
 Anesthesia Post Note  Patient: Gregory [name]  Procedure(s) Performed: SHOULDER ARTHROPLASTY REVISION (Left: Shoulder)     Patient location during evaluation: PACU Anesthesia Type: General Level of consciousness: awake and alert Pain management: pain level controlled Vital Signs Assessment: post-procedure vital signs reviewed and stable Respiratory status: spontaneous breathing, nonlabored ventilation, respiratory function stable and patient connected to nasal cannula oxygen Cardiovascular status: blood pressure returned to baseline and stable Postop Assessment: no apparent nausea or vomiting Anesthetic complications: no   No notable events documented.  Last Vitals:  Vitals:   06/07/23 0905 06/07/23 1314  BP: 117/74 102/61  Pulse: 93 83  Resp: 16 16  Temp: 37.1 C 37.1 C  SpO2: 96% 96%    Last Pain:  Vitals:   06/07/23 1314  TempSrc: Oral  PainSc:                  Gregory [name]

## 2023-06-09 ENCOUNTER — Other Ambulatory Visit: Payer: Self-pay

## 2023-06-10 LAB — SURGICAL PATHOLOGY

## 2023-06-10 NOTE — Addendum Note (Signed)
 Addendum  created 06/10/23 0454 by Theodosia Quay, CRNA   Clinical Note Signed, Intraprocedure Event edited

## 2023-06-11 LAB — AEROBIC/ANAEROBIC CULTURE W GRAM STAIN (SURGICAL/DEEP WOUND): Gram Stain: NONE SEEN

## 2023-06-12 ENCOUNTER — Encounter (HOSPITAL_COMMUNITY): Payer: Self-pay | Admitting: Orthopedic Surgery

## 2023-06-12 ENCOUNTER — Ambulatory Visit: Payer: BC Managed Care – PPO | Attending: Internal Medicine

## 2023-06-12 ENCOUNTER — Other Ambulatory Visit: Payer: Self-pay | Admitting: Emergency Medicine

## 2023-06-12 DIAGNOSIS — I493 Ventricular premature depolarization: Secondary | ICD-10-CM

## 2023-06-12 NOTE — Discharge Summary (Signed)
In most cases prophylactic antibiotics for Dental procdeures after total joint surgery are not necessary.  Exceptions are as follows:  1. History of prior total joint infection  2. Severely immunocompromised (Organ Transplant, cancer chemotherapy, Rheumatoid biologic meds such as Humera)  3. Poorly controlled diabetes (A1C &gt; 8.0, blood glucose over 200)  If you have one of these conditions, contact your surgeon for an antibiotic prescription, prior to your dental procedure. Orthopedic Discharge Summary        Physician Discharge Summary  Patient ID: Gregory [name] MRN: [id number] DOB/AGE: 1960/12/25 63 y.o.  Admit date: 06/06/2023 Discharge date: 06/07/23   Procedures:  Procedure(s) (LRB): SHOULDER ARTHROPLASTY REVISION (Left)  Attending Physician:  Dr. Malon Kindle  Admission Diagnoses:   left shoulder loose glenoid component  Discharge Diagnoses:  left shoulder loose glenoid component   Past Medical History:  Diagnosis Date   Allergy    Anemia    low iron   Anxiety    Cancer (HCC)    Renal cell cancer   CHF (congestive heart failure) (HCC)    Complex tear of lateral meniscus of right knee as current injury 03/20/2017   Complex tear of medial meniscus of right knee 03/20/2017   Diabetes mellitus without complication (HCC)    History of gastrointestinal hemorrhage    Hypertension    Hypertriglyceridemia    Hypothyroidism    Macular degeneration    lasar surgery- Dr [name]   Neuropathy    Nonischemic cardiomyopathy (HCC)    EF 25-30% by echo 09/12/16   Plantar warts    PONV (postoperative nausea and vomiting)    " a little bit of nausea"   Primary localized osteoarthritis of right knee 03/20/2017   Renal cell carcinoma (HCC)    s/p nephrectomy 02/07/16 at [facility])    PCP: [name], MD   Discharged Condition: good  Hospital Course:  Patient underwent the above stated procedure on 06/06/2023. Patient tolerated the procedure well and  brought to the recovery room in good condition and subsequently to the floor. Patient had an uncomplicated hospital course and was stable for discharge.   Disposition: Discharge disposition: 01-Home or Self Care      with follow up in 2 weeks    Follow-up Information     [name], MD. Call in 2 week(s).   Specialty: Orthopedic Surgery Why: call [phone] for appt in two weeks Contact information: [address] [place] [address] [phone]                 Dental Antibiotics:  In most cases prophylactic antibiotics for Dental procdeures after total joint surgery are not necessary.  Exceptions are as follows:  1. History of prior total joint infection  2. Severely immunocompromised (Organ Transplant, cancer chemotherapy, Rheumatoid biologic meds such as Humera)  3. Poorly controlled diabetes (A1C &gt; 8.0, blood glucose over 200)  If you have one of these conditions, contact your surgeon for an antibiotic prescription, prior to your dental procedure.  Discharge Instructions     Discharge patient   Complete by: As directed    Discharge disposition: 01-Home or Self Care   Discharge patient date: 06/07/2023       Allergies as of 06/07/2023   No Known Allergies      Medication List     TAKE these medications    atorvastatin 80 MG tablet Commonly known as: LIPITOR TAKE 1 TABLET(80 MG) BY MOUTH DAILY   carvedilol  25 MG tablet Commonly known as: COREG TAKE 1 TABLET(25 MG) BY MOUTH TWICE DAILY WITH A MEAL   cetirizine 10 MG tablet Commonly known as: ZYRTEC Take 10 mg by mouth daily.   Cholecalciferol 50 MCG (2000 UT) Tabs Take 2 tablets (4,000 Units total) by mouth daily. What changed: when to take this   digoxin 0.125 MG tablet Commonly known as: LANOXIN TAKE 1 TABLET(125 MCG) BY MOUTH DAILY   Entresto 97-103 MG Generic drug: sacubitril-valsartan TAKE 1 TABLET BY MOUTH TWICE DAILY   Fiasp FlexTouch 100 UNIT/ML  FlexTouch Pen Generic drug: insulin aspart Inject 24-40 Units into the skin in the morning, at noon, and at bedtime.   fluticasone 0.05 % cream Commonly known as: CUTIVATE Apply 1 Application topically daily as needed (irritation).   FreeStyle Libre 14 Day Sensor Misc U UTD   furosemide 40 MG tablet Commonly known as: LASIX TAKE 1 TABLET BY MOUTH THREE TIMES A WEEK, ON MONDAY, WEDNESDAY, AND FRIDAY. MAY TAKE ADDITIONAL TABLETS AS NEEDED.   levothyroxine 25 MCG tablet Commonly known as: SYNTHROID TAKE 1 TABLET ONCE DAILY BEFORE BREAKFAST   metFORMIN 500 MG 24 hr tablet Commonly known as: GLUCOPHAGE-XR Take 500 mg by mouth daily.   methocarbamol 500 MG tablet Commonly known as: ROBAXIN Take 1 tablet (500 mg total) by mouth every 8 (eight) hours as needed for muscle spasms.   Mounjaro 15 MG/0.5ML Pen Generic drug: tirzepatide Inject 15 mg into the skin once a week.   oxyCODONE-acetaminophen 5-325 MG tablet Commonly known as: Percocet Take 1 tablet by mouth every 4 (four) hours as needed for severe pain (pain score 7-10).   PARoxetine 30 MG tablet Commonly known as: PAXIL TAKE 1 TABLET(30 MG) BY MOUTH DAILY   spironolactone 25 MG tablet Commonly known as: ALDACTONE TAKE 1/2 TABLET(12.5 MG) BY MOUTH DAILY   thiamine 100 MG tablet Commonly known as: VITAMIN B1 TAKE 1 TABLET(100 MG) BY MOUTH EVERY OTHER DAY   Tresiba FlexTouch 100 UNIT/ML FlexTouch Pen Generic drug: insulin degludec Inject 10 Units into the skin at bedtime.   TYLENOL PO Take 400 mg by mouth 2 (two) times daily.          Signed: Thea Gist 06/12/2023, 7:37 AM  [facility] is now [facility] [address], [place], [place] [address] Phone: [phone] Facebook  Instagram  [employer]

## 2023-06-13 LAB — ACID FAST SMEAR (AFB, MYCOBACTERIA): Acid Fast Smear: NEGATIVE

## 2023-06-19 DIAGNOSIS — Z4789 Encounter for other orthopedic aftercare: Secondary | ICD-10-CM | POA: Diagnosis not present

## 2023-06-20 ENCOUNTER — Inpatient Hospital Stay (HOSPITAL_COMMUNITY): Payer: BC Managed Care – PPO

## 2023-06-20 ENCOUNTER — Encounter (HOSPITAL_COMMUNITY)
Admission: RE | Disposition: A | Discharge status: Home / Self Care | Payer: Self-pay | Source: Home / Self Care | Attending: Orthopedic Surgery

## 2023-06-20 ENCOUNTER — Other Ambulatory Visit: Payer: Self-pay

## 2023-06-20 ENCOUNTER — Encounter: Payer: Self-pay | Admitting: Internal Medicine

## 2023-06-20 ENCOUNTER — Encounter (HOSPITAL_COMMUNITY): Payer: Self-pay | Admitting: Orthopedic Surgery

## 2023-06-20 ENCOUNTER — Inpatient Hospital Stay (HOSPITAL_COMMUNITY)
Admission: RE | Admit: 2023-06-20 | Discharge: 2023-06-21 | DRG: 483 | Disposition: A | Discharge status: Home / Self Care | Payer: BC Managed Care – PPO | Attending: Orthopedic Surgery | Admitting: Orthopedic Surgery

## 2023-06-20 DIAGNOSIS — Z7985 Long-term (current) use of injectable non-insulin antidiabetic drugs: Secondary | ICD-10-CM | POA: Diagnosis not present

## 2023-06-20 DIAGNOSIS — Z6835 Body mass index (BMI) 35.0-35.9, adult: Secondary | ICD-10-CM

## 2023-06-20 DIAGNOSIS — T84098A Other mechanical complication of other internal joint prosthesis, initial encounter: Secondary | ICD-10-CM | POA: Diagnosis not present

## 2023-06-20 DIAGNOSIS — Z96612 Presence of left artificial shoulder joint: Secondary | ICD-10-CM | POA: Diagnosis not present

## 2023-06-20 DIAGNOSIS — D849 Immunodeficiency, unspecified: Secondary | ICD-10-CM | POA: Diagnosis present

## 2023-06-20 DIAGNOSIS — I509 Heart failure, unspecified: Secondary | ICD-10-CM | POA: Diagnosis not present

## 2023-06-20 DIAGNOSIS — E669 Obesity, unspecified: Secondary | ICD-10-CM | POA: Diagnosis not present

## 2023-06-20 DIAGNOSIS — T84028A Dislocation of other internal joint prosthesis, initial encounter: Principal | ICD-10-CM | POA: Diagnosis present

## 2023-06-20 DIAGNOSIS — Z7984 Long term (current) use of oral hypoglycemic drugs: Secondary | ICD-10-CM | POA: Diagnosis not present

## 2023-06-20 DIAGNOSIS — E781 Pure hyperglyceridemia: Secondary | ICD-10-CM | POA: Diagnosis not present

## 2023-06-20 DIAGNOSIS — Z9889 Other specified postprocedural states: Secondary | ICD-10-CM

## 2023-06-20 DIAGNOSIS — I428 Other cardiomyopathies: Secondary | ICD-10-CM | POA: Diagnosis not present

## 2023-06-20 DIAGNOSIS — Y792 Prosthetic and other implants, materials and accessory orthopedic devices associated with adverse incidents: Secondary | ICD-10-CM | POA: Diagnosis present

## 2023-06-20 DIAGNOSIS — I110 Hypertensive heart disease with heart failure: Secondary | ICD-10-CM | POA: Diagnosis present

## 2023-06-20 DIAGNOSIS — E119 Type 2 diabetes mellitus without complications: Secondary | ICD-10-CM | POA: Diagnosis not present

## 2023-06-20 DIAGNOSIS — E039 Hypothyroidism, unspecified: Secondary | ICD-10-CM | POA: Diagnosis present

## 2023-06-20 DIAGNOSIS — Z79890 Hormone replacement therapy (postmenopausal): Secondary | ICD-10-CM

## 2023-06-20 DIAGNOSIS — Z85528 Personal history of other malignant neoplasm of kidney: Secondary | ICD-10-CM | POA: Diagnosis not present

## 2023-06-20 DIAGNOSIS — Z01818 Encounter for other preprocedural examination: Secondary | ICD-10-CM

## 2023-06-20 DIAGNOSIS — Z79899 Other long term (current) drug therapy: Secondary | ICD-10-CM | POA: Diagnosis not present

## 2023-06-20 DIAGNOSIS — Z905 Acquired absence of kidney: Secondary | ICD-10-CM | POA: Diagnosis not present

## 2023-06-20 DIAGNOSIS — E1140 Type 2 diabetes mellitus with diabetic neuropathy, unspecified: Secondary | ICD-10-CM | POA: Diagnosis not present

## 2023-06-20 DIAGNOSIS — I5022 Chronic systolic (congestive) heart failure: Secondary | ICD-10-CM | POA: Diagnosis not present

## 2023-06-20 DIAGNOSIS — Z794 Long term (current) use of insulin: Secondary | ICD-10-CM

## 2023-06-20 DIAGNOSIS — Z9221 Personal history of antineoplastic chemotherapy: Secondary | ICD-10-CM

## 2023-06-20 DIAGNOSIS — E1165 Type 2 diabetes mellitus with hyperglycemia: Secondary | ICD-10-CM | POA: Diagnosis present

## 2023-06-20 DIAGNOSIS — S42202A Unspecified fracture of upper end of left humerus, initial encounter for closed fracture: Secondary | ICD-10-CM | POA: Diagnosis not present

## 2023-06-20 HISTORY — PX: REVERSE SHOULDER ARTHROPLASTY: SHX5054

## 2023-06-20 LAB — GLUCOSE, CAPILLARY
Glucose-Capillary: 351 mg/dL — ABNORMAL HIGH (ref 70–99)
Glucose-Capillary: 349 mg/dL — ABNORMAL HIGH (ref 70–99)
Glucose-Capillary: 319 mg/dL — ABNORMAL HIGH (ref 70–99)

## 2023-06-20 LAB — TYPE AND SCREEN
ABO/RH(D): O NEG
Antibody Screen: NEGATIVE

## 2023-06-20 SURGERY — ARTHROPLASTY, SHOULDER, TOTAL, REVERSE
Anesthesia: General | Site: Shoulder | Laterality: Left

## 2023-06-20 MED ORDER — CEFAZOLIN SODIUM-DEXTROSE 2-4 GM/100ML-% IV SOLN
2.0000 g | INTRAVENOUS | Status: AC
  Administered 2023-06-20: 2 g via INTRAVENOUS
  Filled 2023-06-20: qty 100

## 2023-06-20 MED ORDER — HYDROMORPHONE HCL 1 MG/ML IJ SOLN
INTRAMUSCULAR | Status: DC | PRN
  Administered 2023-06-20 (×2): .5 mg via INTRAVENOUS

## 2023-06-20 MED ORDER — LACTATED RINGERS IV SOLN: INTRAVENOUS | Status: DC

## 2023-06-20 MED ORDER — OXYCODONE HCL 5 MG PO TABS
ORAL_TABLET | ORAL | Status: AC
  Administered 2023-06-20: 5 mg via ORAL
  Filled 2023-06-20: qty 1

## 2023-06-20 MED ORDER — LIDOCAINE HCL (PF) 2 % IJ SOLN
INTRAMUSCULAR | Status: AC
Start: 2023-06-20 — End: ?
  Filled 2023-06-20: qty 5

## 2023-06-20 MED ORDER — INSULIN ASPART 100 UNIT/ML IJ SOLN
8.0000 [IU] | Freq: Once | INTRAMUSCULAR | Status: AC
  Administered 2023-06-20: 8 [IU] via SUBCUTANEOUS

## 2023-06-20 MED ORDER — SUGAMMADEX SODIUM 200 MG/2ML IV SOLN
INTRAVENOUS | Status: DC | PRN
  Administered 2023-06-20: 200 mg via INTRAVENOUS

## 2023-06-20 MED ORDER — FLUTICASONE PROPIONATE 0.05 % EX CREA
1.0000 | TOPICAL_CREAM | Freq: Every day | CUTANEOUS | Status: DC | PRN
Start: 2023-06-20 — End: 2023-06-20

## 2023-06-20 MED ORDER — LORATADINE 10 MG PO TABS: 10.0000 mg | ORAL_TABLET | Freq: Every day | ORAL | Status: DC

## 2023-06-20 MED ORDER — OXYCODONE HCL 5 MG PO TABS: 5.0000 mg | ORAL_TABLET | Freq: Once | ORAL | Status: AC | PRN

## 2023-06-20 MED ORDER — PROPOFOL 10 MG/ML IV BOLUS
INTRAVENOUS | Status: AC
Start: 2023-06-20 — End: ?
  Filled 2023-06-20: qty 20

## 2023-06-20 MED ORDER — LABETALOL HCL 5 MG/ML IV SOLN
INTRAVENOUS | Status: DC | PRN
  Administered 2023-06-20 (×3): 5 mg via INTRAVENOUS

## 2023-06-20 MED ORDER — ONDANSETRON HCL 4 MG/2ML IJ SOLN
INTRAMUSCULAR | Status: DC | PRN
  Administered 2023-06-20: 4 mg via INTRAVENOUS

## 2023-06-20 MED ORDER — LIDOCAINE HCL (CARDIAC) PF 100 MG/5ML IV SOSY
PREFILLED_SYRINGE | INTRAVENOUS | Status: DC | PRN
  Administered 2023-06-20: 60 mg via INTRAVENOUS

## 2023-06-20 MED ORDER — ONDANSETRON HCL 4 MG/2ML IJ SOLN
INTRAMUSCULAR | Status: AC
  Filled 2023-06-20: qty 2

## 2023-06-20 MED ORDER — LABETALOL HCL 5 MG/ML IV SOLN
INTRAVENOUS | Status: AC
Start: 2023-06-20 — End: ?
  Filled 2023-06-20: qty 4

## 2023-06-20 MED ORDER — METFORMIN HCL ER 500 MG PO TB24
500.0000 mg | ORAL_TABLET | Freq: Every day | ORAL | Status: DC
Start: 2023-06-21 — End: 2023-06-21
  Filled 2023-06-20: qty 1

## 2023-06-20 MED ORDER — CARVEDILOL 25 MG PO TABS
25.0000 mg | ORAL_TABLET | Freq: Two times a day (BID) | ORAL | Status: DC
  Administered 2023-06-20: 25 mg via ORAL
  Filled 2023-06-20 (×2): qty 1

## 2023-06-20 MED ORDER — OXYCODONE HCL 5 MG PO TABS
5.0000 mg | ORAL_TABLET | ORAL | Status: DC | PRN
  Administered 2023-06-21: 10 mg via ORAL
  Administered 2023-06-21: 5 mg via ORAL
  Filled 2023-06-20: qty 1
  Filled 2023-06-20: qty 2

## 2023-06-20 MED ORDER — ORAL CARE MOUTH RINSE: 15.0000 mL | Freq: Once | OROMUCOSAL | Status: AC

## 2023-06-20 MED ORDER — ETOMIDATE 2 MG/ML IV SOLN
INTRAVENOUS | Status: DC | PRN
  Administered 2023-06-20: 20 mg via INTRAVENOUS

## 2023-06-20 MED ORDER — METOCLOPRAMIDE HCL 5 MG/ML IJ SOLN: 5.0000 mg | Freq: Three times a day (TID) | INTRAMUSCULAR | Status: DC | PRN

## 2023-06-20 MED ORDER — PHENYLEPHRINE 80 MCG/ML (10ML) SYRINGE FOR IV PUSH (FOR BLOOD PRESSURE SUPPORT)
PREFILLED_SYRINGE | INTRAVENOUS | Status: DC | PRN
  Administered 2023-06-20: 80 ug via INTRAVENOUS

## 2023-06-20 MED ORDER — SODIUM CHLORIDE 0.9% FLUSH
3.0000 mL | Freq: Two times a day (BID) | INTRAVENOUS | Status: DC
  Administered 2023-06-20: 10 mL via INTRAVENOUS

## 2023-06-20 MED ORDER — SACUBITRIL-VALSARTAN 97-103 MG PO TABS
1.0000 | ORAL_TABLET | Freq: Two times a day (BID) | ORAL | Status: DC
  Administered 2023-06-20: 1 via ORAL
  Filled 2023-06-20 (×2): qty 1

## 2023-06-20 MED ORDER — FUROSEMIDE 40 MG PO TABS: 40.0000 mg | ORAL_TABLET | ORAL | Status: DC

## 2023-06-20 MED ORDER — STERILE WATER FOR IRRIGATION IR SOLN
Status: DC | PRN
  Administered 2023-06-20: 1000 mL

## 2023-06-20 MED ORDER — HYDROMORPHONE HCL 1 MG/ML IJ SOLN
INTRAMUSCULAR | Status: AC
  Filled 2023-06-20: qty 1

## 2023-06-20 MED ORDER — SPIRONOLACTONE 12.5 MG HALF TABLET
12.5000 mg | ORAL_TABLET | Freq: Every day | ORAL | Status: DC
Start: 2023-06-21 — End: 2023-06-21
  Filled 2023-06-20: qty 1

## 2023-06-20 MED ORDER — VITAMIN D 25 MCG (1000 UNIT) PO TABS
4000.0000 [IU] | ORAL_TABLET | Freq: Two times a day (BID) | ORAL | Status: DC
  Administered 2023-06-20: 4000 [IU] via ORAL
  Filled 2023-06-20 (×2): qty 4

## 2023-06-20 MED ORDER — THIAMINE HCL 100 MG PO TABS
100.0000 mg | ORAL_TABLET | Freq: Every day | ORAL | Status: DC
Start: 2023-06-21 — End: 2023-06-21
  Filled 2023-06-20 (×2): qty 1

## 2023-06-20 MED ORDER — MENTHOL 3 MG MT LOZG: 1.0000 | LOZENGE | OROMUCOSAL | Status: DC | PRN

## 2023-06-20 MED ORDER — MIDAZOLAM HCL 5 MG/5ML IJ SOLN
INTRAMUSCULAR | Status: DC | PRN
  Administered 2023-06-20: 2 mg via INTRAVENOUS

## 2023-06-20 MED ORDER — INSULIN ASPART 100 UNIT/ML IJ SOLN
0.0000 [IU] | Freq: Three times a day (TID) | INTRAMUSCULAR | Status: DC
Start: 2023-06-21 — End: 2023-06-21
  Administered 2023-06-21: 11 [IU] via SUBCUTANEOUS

## 2023-06-20 MED ORDER — CHLORHEXIDINE GLUCONATE 0.12 % MT SOLN
15.0000 mL | Freq: Once | OROMUCOSAL | Status: AC
  Administered 2023-06-20: 15 mL via OROMUCOSAL

## 2023-06-20 MED ORDER — PAROXETINE HCL 10 MG PO TABS
30.0000 mg | ORAL_TABLET | Freq: Every day | ORAL | Status: DC
  Filled 2023-06-20: qty 3

## 2023-06-20 MED ORDER — ACETAMINOPHEN 325 MG PO TABS
650.0000 mg | ORAL_TABLET | Freq: Two times a day (BID) | ORAL | Status: DC
  Administered 2023-06-20: 650 mg via ORAL
  Filled 2023-06-20 (×2): qty 2

## 2023-06-20 MED ORDER — INSULIN ASPART 100 UNIT/ML IJ SOLN
INTRAMUSCULAR | Status: AC
  Filled 2023-06-20: qty 1

## 2023-06-20 MED ORDER — OXYCODONE HCL 5 MG/5ML PO SOLN: 5.0000 mg | Freq: Once | ORAL | Status: AC | PRN

## 2023-06-20 MED ORDER — METHOCARBAMOL 1000 MG/10ML IJ SOLN
500.0000 mg | Freq: Four times a day (QID) | INTRAMUSCULAR | Status: DC | PRN
Start: 2023-06-20 — End: 2023-06-21
  Administered 2023-06-20: 500 mg via INTRAVENOUS

## 2023-06-20 MED ORDER — HYDROMORPHONE HCL 1 MG/ML IJ SOLN: 0.5000 mg | INTRAMUSCULAR | Status: DC | PRN

## 2023-06-20 MED ORDER — AMISULPRIDE (ANTIEMETIC) 5 MG/2ML IV SOLN: 10.0000 mg | Freq: Once | INTRAVENOUS | Status: DC | PRN

## 2023-06-20 MED ORDER — LEVOTHYROXINE SODIUM 25 MCG PO TABS
25.0000 ug | ORAL_TABLET | Freq: Every day | ORAL | Status: DC
  Administered 2023-06-21: 25 ug via ORAL
  Filled 2023-06-20: qty 1

## 2023-06-20 MED ORDER — DIGOXIN 125 MCG PO TABS
0.1250 mg | ORAL_TABLET | Freq: Every day | ORAL | Status: DC
  Filled 2023-06-20: qty 1

## 2023-06-20 MED ORDER — VANCOMYCIN HCL 1000 MG IV SOLR
INTRAVENOUS | Status: DC | PRN
  Administered 2023-06-20: 1000 mg via TOPICAL

## 2023-06-20 MED ORDER — 0.9 % SODIUM CHLORIDE (POUR BTL) OPTIME
TOPICAL | Status: DC | PRN
  Administered 2023-06-20: 1000 mL

## 2023-06-20 MED ORDER — ETOMIDATE 2 MG/ML IV SOLN
INTRAVENOUS | Status: AC
  Filled 2023-06-20: qty 10

## 2023-06-20 MED ORDER — ONDANSETRON HCL 4 MG/2ML IJ SOLN: 4.0000 mg | Freq: Four times a day (QID) | INTRAMUSCULAR | Status: DC | PRN

## 2023-06-20 MED ORDER — INSULIN GLARGINE-YFGN 100 UNIT/ML ~~LOC~~ SOLN
10.0000 [IU] | Freq: Every day | SUBCUTANEOUS | Status: DC
  Administered 2023-06-20: 10 [IU] via SUBCUTANEOUS
  Filled 2023-06-20: qty 0.1

## 2023-06-20 MED ORDER — DOCUSATE SODIUM 100 MG PO CAPS
100.0000 mg | ORAL_CAPSULE | Freq: Two times a day (BID) | ORAL | Status: DC
  Administered 2023-06-20: 100 mg via ORAL
  Filled 2023-06-20 (×2): qty 1

## 2023-06-20 MED ORDER — ATORVASTATIN CALCIUM 40 MG PO TABS
80.0000 mg | ORAL_TABLET | Freq: Every day | ORAL | Status: DC
  Filled 2023-06-20: qty 2

## 2023-06-20 MED ORDER — HYDROMORPHONE HCL 1 MG/ML IJ SOLN
0.2500 mg | INTRAMUSCULAR | Status: DC | PRN
  Administered 2023-06-20: 0.25 mg via INTRAVENOUS

## 2023-06-20 MED ORDER — PHENOL 1.4 % MT LIQD: 1.0000 | OROMUCOSAL | Status: DC | PRN

## 2023-06-20 MED ORDER — INSULIN ASPART (W/NIACINAMIDE) 100 UNIT/ML ~~LOC~~ SOPN
24.0000 [IU] | PEN_INJECTOR | Freq: Three times a day (TID) | SUBCUTANEOUS | Status: DC
Start: 2023-06-20 — End: 2023-06-20

## 2023-06-20 MED ORDER — MIDAZOLAM HCL 2 MG/2ML IJ SOLN
INTRAMUSCULAR | Status: AC
Start: 2023-06-20 — End: ?
  Filled 2023-06-20: qty 2

## 2023-06-20 MED ORDER — ONDANSETRON HCL 4 MG/2ML IJ SOLN
4.0000 mg | Freq: Once | INTRAMUSCULAR | Status: DC | PRN
Start: 2023-06-20 — End: 2023-06-20

## 2023-06-20 MED ORDER — TRANEXAMIC ACID-NACL 1000-0.7 MG/100ML-% IV SOLN
1000.0000 mg | INTRAVENOUS | Status: AC
  Administered 2023-06-20: 1000 mg via INTRAVENOUS
  Filled 2023-06-20: qty 100

## 2023-06-20 MED ORDER — METOCLOPRAMIDE HCL 5 MG PO TABS
5.0000 mg | ORAL_TABLET | Freq: Three times a day (TID) | ORAL | Status: DC | PRN
Start: 2023-06-20 — End: 2023-06-21

## 2023-06-20 MED ORDER — FENTANYL CITRATE (PF) 100 MCG/2ML IJ SOLN
INTRAMUSCULAR | Status: DC | PRN
  Administered 2023-06-20: 25 ug via INTRAVENOUS
  Administered 2023-06-20: 50 ug via INTRAVENOUS
  Administered 2023-06-20: 25 ug via INTRAVENOUS
  Administered 2023-06-20: 100 ug via INTRAVENOUS
  Administered 2023-06-20: 50 ug via INTRAVENOUS

## 2023-06-20 MED ORDER — SODIUM CHLORIDE 0.9% FLUSH: 3.0000 mL | INTRAVENOUS | Status: DC | PRN

## 2023-06-20 MED ORDER — VANCOMYCIN HCL 1000 MG IV SOLR
INTRAVENOUS | Status: AC
  Filled 2023-06-20: qty 20

## 2023-06-20 MED ORDER — BUPIVACAINE-EPINEPHRINE 0.25% -1:200000 IJ SOLN
INTRAMUSCULAR | Status: AC
  Filled 2023-06-20: qty 1

## 2023-06-20 MED ORDER — METHOCARBAMOL 1000 MG/10ML IJ SOLN
INTRAMUSCULAR | Status: AC
  Filled 2023-06-20: qty 10

## 2023-06-20 MED ORDER — FENTANYL CITRATE (PF) 250 MCG/5ML IJ SOLN
INTRAMUSCULAR | Status: AC
  Filled 2023-06-20: qty 5

## 2023-06-20 MED ORDER — METHOCARBAMOL 500 MG PO TABS
500.0000 mg | ORAL_TABLET | Freq: Four times a day (QID) | ORAL | Status: DC | PRN
Start: 2023-06-20 — End: 2023-06-21
  Administered 2023-06-21: 500 mg via ORAL
  Filled 2023-06-20: qty 1

## 2023-06-20 MED ORDER — DEXAMETHASONE SODIUM PHOSPHATE 10 MG/ML IJ SOLN
INTRAMUSCULAR | Status: AC
Start: 2023-06-20 — End: ?
  Filled 2023-06-20: qty 1

## 2023-06-20 MED ORDER — ACETAMINOPHEN 325 MG PO TABS: 325.0000 mg | ORAL_TABLET | Freq: Four times a day (QID) | ORAL | Status: DC | PRN

## 2023-06-20 MED ORDER — HYDROMORPHONE HCL 2 MG/ML IJ SOLN
INTRAMUSCULAR | Status: AC
  Filled 2023-06-20: qty 1

## 2023-06-20 MED ORDER — OXYCODONE-ACETAMINOPHEN 5-325 MG PO TABS: 1.0000 | ORAL_TABLET | ORAL | 0 refills | Status: DC | PRN

## 2023-06-20 MED ORDER — ROCURONIUM BROMIDE 10 MG/ML (PF) SYRINGE
PREFILLED_SYRINGE | INTRAVENOUS | Status: AC
Start: 2023-06-20 — End: ?
  Filled 2023-06-20: qty 10

## 2023-06-20 MED ORDER — ROCURONIUM BROMIDE 100 MG/10ML IV SOLN
INTRAVENOUS | Status: DC | PRN
  Administered 2023-06-20: 70 mg via INTRAVENOUS

## 2023-06-20 MED ORDER — TIRZEPATIDE 15 MG/0.5ML ~~LOC~~ SOAJ: 15.0000 mg | SUBCUTANEOUS | Status: DC

## 2023-06-20 MED ORDER — PROPOFOL 500 MG/50ML IV EMUL
INTRAVENOUS | Status: AC
  Filled 2023-06-20: qty 50

## 2023-06-20 MED ORDER — ONDANSETRON HCL 4 MG PO TABS: 4.0000 mg | ORAL_TABLET | Freq: Four times a day (QID) | ORAL | Status: DC | PRN

## 2023-06-20 SURGICAL SUPPLY — 49 items
BAG COUNTER SPONGE SURGICOUNT (BAG) IMPLANT
BAG ZIPLOCK 12X15 (MISCELLANEOUS) IMPLANT
BIT DRILL 1.6MX128 (BIT) IMPLANT
BLADE SAG 18X100X1.27 (BLADE) ×2 IMPLANT
CLSR STERI-STRIP ANTIMIC 1/2X4 (GAUZE/BANDAGES/DRESSINGS) IMPLANT
COVER BACK TABLE 60X90IN (DRAPES) ×2 IMPLANT
COVER SURGICAL LIGHT HANDLE (MISCELLANEOUS) ×2 IMPLANT
DRAPE INCISE IOBAN 66X45 STRL (DRAPES) ×2 IMPLANT
DRAPE SHEET LG 3/4 BI-LAMINATE (DRAPES) ×2 IMPLANT
DRAPE SURG ORHT 6 SPLT 77X108 (DRAPES) ×4 IMPLANT
DRAPE TOP 10253 STERILE (DRAPES) ×2 IMPLANT
DRAPE U-SHAPE 47X51 STRL (DRAPES) ×2 IMPLANT
DRSG ADAPTIC 3X8 NADH LF (GAUZE/BANDAGES/DRESSINGS) ×2 IMPLANT
DURAPREP 26ML APPLICATOR (WOUND CARE) ×2 IMPLANT
ELECT BLADE TIP CTD 4 INCH (ELECTRODE) ×2 IMPLANT
ELECT NDL TIP 2.8 STRL (NEEDLE) ×2 IMPLANT
ELECT NEEDLE TIP 2.8 STRL (NEEDLE) ×1 IMPLANT
ELECT REM PT RETURN 15FT ADLT (MISCELLANEOUS) ×2 IMPLANT
FACESHIELD WRAPAROUND (MASK) ×1 IMPLANT
FACESHIELD WRAPAROUND OR TEAM (MASK) ×2 IMPLANT
GAUZE PAD ABD 8X10 STRL (GAUZE/BANDAGES/DRESSINGS) ×2 IMPLANT
GAUZE SPONGE 4X4 12PLY STRL (GAUZE/BANDAGES/DRESSINGS) ×2 IMPLANT
GLOVE BIOGEL PI IND STRL 7.5 (GLOVE) ×2 IMPLANT
GLOVE BIOGEL PI IND STRL 8.5 (GLOVE) ×2 IMPLANT
GLOVE ORTHO TXT STRL SZ7.5 (GLOVE) ×2 IMPLANT
GLOVE SURG ORTHO 8.5 STRL (GLOVE) ×2 IMPLANT
GOWN STRL REUS W/ TWL XL LVL3 (GOWN DISPOSABLE) ×4 IMPLANT
HUMERAL SPACER (Trauma) ×1 IMPLANT
KIT BASIN OR (CUSTOM PROCEDURE TRAY) ×2 IMPLANT
KIT TURNOVER KIT A (KITS) IMPLANT
MANIFOLD NEPTUNE II (INSTRUMENTS) ×2 IMPLANT
NDL MAYO CATGUT SZ4 TPR NDL (NEEDLE) IMPLANT
NEEDLE MAYO CATGUT SZ4 (NEEDLE) IMPLANT
NS IRRIG 1000ML POUR BTL (IV SOLUTION) ×2 IMPLANT
PACK SHOULDER (CUSTOM PROCEDURE TRAY) ×2 IMPLANT
RESTRAINT HEAD UNIVERSAL NS (MISCELLANEOUS) ×2 IMPLANT
SLING ARM FOAM STRAP LRG (SOFTGOODS) IMPLANT
SPACER HUMERAL (Trauma) IMPLANT
SPIKE FLUID TRANSFER (MISCELLANEOUS) ×2 IMPLANT
SPONGE T-LAP 4X18 ~~LOC~~+RFID (SPONGE) IMPLANT
STRIP CLOSURE SKIN 1/2X4 (GAUZE/BANDAGES/DRESSINGS) ×2 IMPLANT
SUT FIBERWIRE #2 38 T-5 BLUE (SUTURE) ×1
SUT MNCRL AB 4-0 PS2 18 (SUTURE) ×2 IMPLANT
SUT VIC AB 0 CT1 36 (SUTURE) ×2 IMPLANT
SUT VIC AB 0 CT2 27 (SUTURE) ×2 IMPLANT
SUT VIC AB 2-0 CT1 TAPERPNT 27 (SUTURE) ×2 IMPLANT
SUTURE FIBERWR #2 38 T-5 BLUE (SUTURE) ×2 IMPLANT
TOWEL GREEN STERILE FF (TOWEL DISPOSABLE) ×2 IMPLANT
TOWEL OR 17X26 10 PK STRL BLUE (TOWEL DISPOSABLE) ×2 IMPLANT

## 2023-06-20 NOTE — Interval H&P Note (Signed)
History and Physical Interval Note:  06/20/2023 2:29 PM  Gregory [name]  has presented today for surgery, with the diagnosis of LEFT SHOULDER REVISION.  The various methods of treatment have been discussed with the patient and family. After consideration of risks, benefits and other options for treatment, the patient has consented to  Procedure(s): REVERSE SHOULDER ARTHROPLASTY REVISION (Left) as a surgical intervention.  The patient's history has been reviewed, patient examined, no change in status, stable for surgery.  I have reviewed the patient's chart and labs.  Questions were answered to the patient's satisfaction.     Verlee Rossetti

## 2023-06-20 NOTE — H&P (Signed)
Patient's anticipated LOS is less than 2 midnights, meeting these requirements: - Younger than 12 - Lives within 1 hour of care - Has a competent adult at home to recover with post-op recover - NO history of  - Chronic pain requiring opiods  - Diabetes  - Coronary Artery Disease  - Heart failure  - Heart attack  - Stroke  - DVT/VTE  - Cardiac arrhythmia  - Respiratory Failure/COPD  - Renal failure  - Anemia  - Advanced Liver disease     Gregory [name] is an 63 y.o. male.    Chief Complaint: left shoulder pain  HPI: Pt is a 63 y.o. male complaining of left shoulder pain for multiple years. Pain had continually increased since the beginning. X-rays in the clinic show dislocated reverse total shoulder. . Various options are discussed with the patient. Risks, benefits and expectations were discussed with the patient. Patient understand the risks, benefits and expectations and wishes to proceed with surgery.   PCP:  [name], MD  D/C Plans: Home  PMH: Past Medical History:  Diagnosis Date   Allergy    Anemia    low iron   Anxiety    Cancer (HCC)    Renal cell cancer   CHF (congestive heart failure) (HCC)    Complex tear of lateral meniscus of right knee as current injury 03/20/2017   Complex tear of medial meniscus of right knee 03/20/2017   Diabetes mellitus without complication (HCC)    History of gastrointestinal hemorrhage    Hypertension    Hypertriglyceridemia    Hypothyroidism    Macular degeneration    lasar surgery- Dr [name]   Neuropathy    Nonischemic cardiomyopathy (HCC)    EF 25-30% by echo 09/12/16   Plantar warts    PONV (postoperative nausea and vomiting)    " a little bit of nausea"   Primary localized osteoarthritis of right knee 03/20/2017   Renal cell carcinoma (HCC)    s/p nephrectomy 02/07/16 at [facility])    PSH: Past Surgical History:  Procedure Laterality Date   CARDIAC CATHETERIZATION N/A 04/19/2016   Procedure: Left Heart  Cath and Coronary Angiography;  Surgeon: Dolores Patty, MD;  Location: [facility] INVASIVE CV LAB;  Service: Cardiovascular;  Laterality: N/A;   CHONDROPLASTY Right 03/20/2017   Procedure: CHONDROPLASTY;  Surgeon: Teryl Lucy, MD;  Location: [facility] OR;  Service: Orthopedics;  Laterality: Right;   COLONOSCOPY  03/22/2005   EYE SURGERY Right    laser surgery, right eye macular degeneration   ICD IMPLANT N/A 05/05/2023   Procedure: ICD IMPLANT;  Surgeon: Duke Salvia, MD;  Location: [facility] INVASIVE CV LAB;  Service: Cardiovascular;  Laterality: N/A;   KNEE ARTHROSCOPY Right    x 2   KNEE ARTHROSCOPY WITH MEDIAL MENISECTOMY Right 03/20/2017   Procedure: KNEE ARTHROSCOPY WITH MEDIAL AND LATERAL MENISECTOMY;  Surgeon: Teryl Lucy, MD;  Location: [facility] OR;  Service: Orthopedics;  Laterality: Right;   NEPHRECTOMY Right    orif fx fibula     sebaceous cyst excision  1985   TONSILLECTOMY     TOTAL SHOULDER ARTHROPLASTY Left 11/11/2014   Procedure: LEFT TOTAL SHOULDER ARTHROPLASTY;  Surgeon: [name], MD;  Location: [facility] OR;  Service: Orthopedics;  Laterality: Left;   TOTAL SHOULDER REVISION Left 06/06/2023   Procedure: SHOULDER ARTHROPLASTY REVISION;  Surgeon: [name], MD;  Location: [facility] ORS;  Service: Orthopedics;  Laterality: Left;  with interscalene block   VASECTOMY  Social History:  reports that he has never smoked. He has never used smokeless tobacco. He reports current alcohol use. He reports that he does not use drugs. BMI: Estimated body mass index is 35.05 kg/m as calculated from the following:   Height as of 06/06/23: 6\' 2"  (1.88 m).   Weight as of 06/06/23: 123.8 kg.  Lab Results  Component Value Date   ALBUMIN 4.3 04/25/2021   Diabetes:   Patient has a diagnosis of diabetes,  Lab Results  Component Value Date   HGBA1C 8.2 (H) 05/30/2023   Smoking Status:   reports that he has never smoked. He has never used smokeless tobacco.    Allergies:  No Known  Allergies  Medications: No current facility-administered medications for this encounter.   Current Outpatient Medications  Medication Sig Dispense Refill   Acetaminophen (TYLENOL PO) Take 400 mg by mouth 2 (two) times daily.     atorvastatin (LIPITOR) 80 MG tablet TAKE 1 TABLET(80 MG) BY MOUTH DAILY 90 tablet 1   carvedilol (COREG) 25 MG tablet TAKE 1 TABLET(25 MG) BY MOUTH TWICE DAILY WITH A MEAL 180 tablet 3   cetirizine (ZYRTEC) 10 MG tablet Take 10 mg by mouth daily.     Cholecalciferol 50 MCG (2000 UT) TABS Take 2 tablets (4,000 Units total) by mouth daily. (Patient taking differently: Take 4,000 Units by mouth 2 (two) times daily.) 180 tablet 1   Continuous Blood Gluc Sensor (FREESTYLE LIBRE 14 DAY SENSOR) MISC U UTD     digoxin (LANOXIN) 0.125 MG tablet TAKE 1 TABLET(125 MCG) BY MOUTH DAILY 90 tablet 3   ENTRESTO 97-103 MG TAKE 1 TABLET BY MOUTH TWICE DAILY 180 tablet 3   FIASP FLEXTOUCH 100 UNIT/ML FlexTouch Pen Inject 24-40 Units into the skin in the morning, at noon, and at bedtime.     fluticasone (CUTIVATE) 0.05 % cream Apply 1 Application topically daily as needed (irritation).     furosemide (LASIX) 40 MG tablet TAKE 1 TABLET BY MOUTH THREE TIMES A WEEK, ON MONDAY, WEDNESDAY, AND FRIDAY. MAY TAKE ADDITIONAL TABLETS AS NEEDED. 180 tablet 0   levothyroxine (SYNTHROID, LEVOTHROID) 25 MCG tablet TAKE 1 TABLET ONCE DAILY BEFORE BREAKFAST 30 tablet 0   metFORMIN (GLUCOPHAGE-XR) 500 MG 24 hr tablet Take 500 mg by mouth daily.     methocarbamol (ROBAXIN) 500 MG tablet Take 1 tablet (500 mg total) by mouth every 8 (eight) hours as needed for muscle spasms. 40 tablet 1   oxyCODONE-acetaminophen (PERCOCET) 5-325 MG tablet Take 1 tablet by mouth every 4 (four) hours as needed for severe pain (pain score 7-10). 30 tablet 0   PARoxetine (PAXIL) 30 MG tablet TAKE 1 TABLET(30 MG) BY MOUTH DAILY 90 tablet 1   spironolactone (ALDACTONE) 25 MG tablet TAKE 1/2 TABLET(12.5 MG) BY MOUTH DAILY 45  tablet 3   thiamine (VITAMIN B1) 100 MG tablet TAKE 1 TABLET(100 MG) BY MOUTH EVERY OTHER DAY 45 tablet 1   tirzepatide (MOUNJARO) 15 MG/0.5ML Pen Inject 15 mg into the skin once a week. (Patient not taking: Reported on 06/03/2023) 2 mL 0   TRESIBA FLEXTOUCH 100 UNIT/ML FlexTouch Pen Inject 10 Units into the skin at bedtime.      No results found for this or any previous visit (from the past 48 hours). No results found.  ROS: Pain with rom of the left upper extremity  Physical Exam: Alert and oriented 63 y.o. male in no acute distress Cranial nerves 2-12 intact Cervical spine: full rom with  no tenderness, nv intact distally Chest: active breath sounds bilaterally, no wheeze rhonchi or rales Heart: regular rate and rhythm, no murmur Abd: non tender non distended with active bowel sounds Hip is stable with rom  Left shoulder with painful rom Nv intact distally No rashes or edema distally  Assessment/Plan Assessment: left reverse total shoulder dislocation  Plan:  Patient will undergo a left shoulder revision by Dr. Ranell Patrick at [place] Risks benefits and expectations were discussed with the patient. Patient understand risks, benefits and expectations and wishes to proceed. Preoperative templating of the joint replacement has been completed, documented, and submitted to the Operating Room personnel in order to optimize intra-operative equipment management.   Alphonsa Overall PA-C, MPAS [facility] is now [employer] [address], [address], [place], [place] [address] Phone: [phone] [email or web address] Facebook  [employer]

## 2023-06-20 NOTE — Progress Notes (Addendum)
Attempted to reach patient to go over pre-op instructions had to leave voicemail.   The patient has an ICD and will need to have [employer] rep present for case.  Attempted to reach Rep  8:55 AM [employer] rep is aware and plans to be here for procedure

## 2023-06-20 NOTE — Anesthesia Postprocedure Evaluation (Signed)
Anesthesia Post Note  Patient: Gregory [name]  Procedure(s) Performed: REVERSE SHOULDER ARTHROPLASTY REVISION (Left: Shoulder)     Patient location during evaluation: PACU Anesthesia Type: General Level of consciousness: awake and alert Pain management: pain level controlled Vital Signs Assessment: post-procedure vital signs reviewed and stable Respiratory status: spontaneous breathing, nonlabored ventilation, respiratory function stable and patient connected to nasal cannula oxygen Cardiovascular status: blood pressure returned to baseline and stable Postop Assessment: no apparent nausea or vomiting Anesthetic complications: no  No notable events documented.  Last Vitals:  Vitals:   06/20/23 1830 06/20/23 1845  BP: 138/88   Pulse: 82 86  Resp: 11 14  Temp:    SpO2: 99% 96%    Last Pain:  Vitals:   06/20/23 1815  TempSrc:   PainSc: 0-No pain                 Jonea Bukowski,W. EDMOND

## 2023-06-20 NOTE — Op Note (Unsigned)
NAME: DELVONTE, BERENSON MEDICAL RECORD NO: [id number] ACCOUNT NO: [id number] DATE OF BIRTH: 02-17-1961 FACILITY: [facility] LOCATION: [facility] PHYSICIAN: Almedia Balls. 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  PROCEDURE PERFORMED:  Left shoulder revision/irrigation and debridement with addition of 9 mm metallic spacer on the humeral side.  ATTENDING SURGEON: Almedia Balls. Ranell Patrick, MD  ASSISTANT: Konrad Felix Dixon, [place], who was scrubbed during the entire procedure, and necessary for satisfactory completion of surgery.  ANESTHESIA: General anesthesia was used.  ESTIMATED BLOOD LOSS: Minimal.  FLUID REPLACEMENT: 1000 mL crystalloid  COUNTS: Count was correct.  COMPLICATIONS: There were no complications.  ANTIBIOTICS: Perioperative antibiotics given.  INDICATIONS: The patient is a 63 year old male who is status post revision of a failed anatomic total shoulder replacement two weeks ago. The patient reported postoperative pain all the way to his two-week appointment in the office where x-rays revealed  a shoulder dislocation consistent with instability of his reverse total shoulder replacement. I recommended surgical I and D and revision as necessary of his reverse shoulder replacement to get a stable shoulder. Informed consent obtained.  DESCRIPTION OF PROCEDURE: After an adequate level of anesthesia achieved, the patient was positioned in the modified beach chair position. The left shoulder was quickly identified and sterile prep and drape performed. We utilized the patient's prior  deltopectoral incision with a #10 blade scalpel. We removed all suture material that we could all the way down to the deltopectoral interval, which we freed up the heavy Vicryl and removed  that. This allowed [place] to evacuate the seroma. The patient's  shoulder had already auto reduced itself. Taking the shoulder through a full arc of motion, there was no bony impingement that was forcing the shoulder out. There was just some distal laxity. Pulling distally on the arm, I could basically pull at least a  3 mm x 4 mm gap between the glenosphere and the humeral poly. We went ahead and checked to make sure there was no scar tissue posteriorly. The patient did have an avulsion of the greater tuberosity, but that was not impacting stability whatsoever. We  went ahead and dislocated the shoulder and then felt like we could add 9 more mm with the additional metal extension on the humeral side, so we used a Cobb elevator and were able to dissociate the taper attachment of the metal that was underneath the  9-mm poly, so it was between the metaphysis and the augment that we were able to break that loose. With that removed, we inspected the polyethylene carefully and also the poly attachment to the metal augment, which was intact, and we felt like we could  reuse that, so we went ahead and added a 9-mm augment to the humeral tray after irrigation and we impacted that in position. We then took the 9 mm augment that we had used initially with the 9 mm poly attached to it and impacted that on our first  augment. So he now has two 9 mm augments and a 9 mm poly. With that in place, we irrigated thoroughly and then reduced the shoulder. There was a significant pop as it reduced and it was quite stable. I really could not create any gaps with any type of  motion, either cross body, abduction, extension, external rotation. I just could not create any gap at  all and also with a good pull on the distal arm, everything moved together as a unit. We felt like this addressed our instability and we also verified  there was no impinging lesion that was kicking him out and I really could not dislocate him once we had them  reduced. So, we irrigated with antibiotic solution and then after that, we added vancomycin powder to the wound and then closed the deep layer of  the deltopectoral layer with #0 PDS suture interrupted followed by 2-0 Vicryl and staples for skin. Sterile dressing was applied and shoulder sling. The patient was transported to the recovery room in stable condition.   SHY D: 06/20/2023 5:47:39 pm T: 06/20/2023 7:11:00 pm  JOB: [id number]/ [id number]

## 2023-06-20 NOTE — Anesthesia Preprocedure Evaluation (Signed)
Anesthesia Evaluation  Patient identified by MRN, date of birth, ID band Patient awake    Reviewed: Allergy & Precautions, NPO status , Patient's Chart, lab work & pertinent test results, reviewed documented beta blocker date and time   History of Anesthesia Complications (+) PONV and history of anesthetic complications  Airway Mallampati: II  TM Distance: >3 FB     Dental  (+) Caps, Dental Advisory Given   Pulmonary neg pulmonary ROS   Pulmonary exam normal breath sounds clear to auscultation       Cardiovascular hypertension, Pt. on medications and Pt. on home beta blockers +CHF  Normal cardiovascular exam+ pacemaker + Cardiac Defibrillator  Rhythm:Regular Rate:Normal  Echo 2024 1. Left ventricular ejection fraction, by estimation, is 20 to 25%. The  left ventricle has severely decreased function. The left ventricle  demonstrates global hypokinesis. The left ventricular internal cavity size  was mildly to moderately dilated. Left  ventricular diastolic parameters are consistent with Grade I diastolic  dysfunction (impaired relaxation).   2. Right ventricular systolic function is normal. The right ventricular  size is normal.   3. Left atrial size was moderately dilated.   4. The mitral valve is grossly normal. Trivial mitral valve  regurgitation. No evidence of mitral stenosis.   5. The aortic valve is tricuspid. There is mild calcification of the  aortic valve. Aortic valve regurgitation is trivial. No aortic stenosis is  present.   6. Aortic dilatation noted. There is borderline dilatation of the aortic  root, measuring 38 mm.   7. The inferior vena cava is normal in size with greater than 50%  respiratory variability, suggesting right atrial pressure of 3 mmHg.   EKG 06/05/23 NSR, Ventricular bigeminy, anterolateral MI    Neuro/Psych  PSYCHIATRIC DISORDERS Anxiety Depression    Diabetic polyneuropathy Macular  degeneration    GI/Hepatic negative GI ROS, Neg liver ROS,,,  Endo/Other  diabetes, Poorly Controlled, Type 2, Insulin DependentHypothyroidism  Class 3 obesityGLP-1 RA therapy- last dose 1/19  Renal/GU Renal InsufficiencyRenal diseaseS/P nephrectomy due to renal cell Ca 2017  negative genitourinary   Musculoskeletal  (+) Arthritis , Osteoarthritis,  Loose glenoid component left total shoulder   Abdominal   Peds  Hematology  (+) Blood dyscrasia, anemia   Anesthesia Other Findings   Reproductive/Obstetrics                              Anesthesia Physical Anesthesia Plan  ASA: 4  Anesthesia Plan: General   Post-op Pain Management: Regional block*, Minimal or no pain anticipated, Dilaudid IV and Precedex   Induction: Intravenous  PONV Risk Score and Plan: 4 or greater and Treatment may vary due to age or medical condition and Ondansetron  Airway Management Planned: Oral ETT  Additional Equipment: Arterial line  Intra-op Plan:   Post-operative Plan: Extubation in OR  Informed Consent: I have reviewed the patients History and Physical, chart, labs and discussed the procedure including the risks, benefits and alternatives for the proposed anesthesia with the patient or authorized representative who has indicated his/her understanding and acceptance.     Dental advisory given  Plan Discussed with: CRNA and Anesthesiologist  Anesthesia Plan Comments:          Anesthesia Quick Evaluation

## 2023-06-20 NOTE — Transfer of Care (Signed)
Immediate Anesthesia Transfer of Care Note  Patient: Gregory [name]  Procedure(s) Performed: REVERSE SHOULDER ARTHROPLASTY REVISION (Left: Shoulder)  Patient Location: PACU  Anesthesia Type:General  Level of Consciousness: awake, alert , and oriented  Airway & Oxygen Therapy: Patient Spontanous Breathing and Patient connected to face mask oxygen  Post-op Assessment: Report given to RN and Post -op Vital signs reviewed and stable  Post vital signs: Reviewed and stable  Last Vitals:  Vitals Value Taken Time  BP 138/93 06/20/23 1800  Temp    Pulse 83 06/20/23 1803  Resp 0 06/20/23 1803  SpO2 99 % 06/20/23 1803  Vitals shown include unfiled device data.  Last Pain:  Vitals:   06/20/23 1435  TempSrc: Oral         Complications: No notable events documented.

## 2023-06-20 NOTE — Anesthesia Procedure Notes (Signed)
Procedure Name: Intubation Date/Time: 06/20/2023 4:30 PM  Performed by: Thomasene Ripple, CRNAPre-anesthesia Checklist: Patient identified, Emergency Drugs available, Suction available and Patient being monitored Patient Re-evaluated:Patient Re-evaluated prior to induction Oxygen Delivery Method: Circle System Utilized Preoxygenation: Pre-oxygenation with 100% oxygen Induction Type: IV induction Ventilation: Mask ventilation without difficulty and Oral airway inserted - appropriate to patient size Laryngoscope Size: Miller and 3 Grade View: Grade II Tube type: Oral Number of attempts: 1 Airway Equipment and Method: Stylet and Oral airway Placement Confirmation: ETT inserted through vocal cords under direct vision, positive ETCO2 and breath sounds checked- equal and bilateral Secured at: 23 cm Tube secured with: Tape Dental Injury: Teeth and Oropharynx as per pre-operative assessment

## 2023-06-20 NOTE — Progress Notes (Signed)
Patient's sp02 dropping intermittently to 80% on room air. Patient has used incentive spirometer, up in chair, and ambulated around unit. Dr. Sampson Goon and Dr. Ranell Patrick call and notified. Decision made to admit patient to inpatient unit over night. Patient and patient's wife Jeanice Lim) made aware and agreeable to this decision.

## 2023-06-20 NOTE — Brief Op Note (Signed)
06/20/2023  5:39 PM  PATIENT:  Gregory [name]  63 y.o. male  PRE-OPERATIVE DIAGNOSIS:  LEFT REVERSE SHOULDER I+D AND REVISION  POST-OPERATIVE DIAGNOSIS:  LEFT REVERSE SHOULDER I+D AND REVISION  PROCEDURE:  Procedure(s): REVERSE SHOULDER ARTHROPLASTY REVISION (Left) DePuy Delta Xtend with addition of 9mm Augment on humeral side  SURGEON:  Surgeons and Role:    [name], MD - Primary    * Aundria Rud Noah Delaine, MD - Assisting  PHYSICIAN ASSISTANT:   ASSISTANTS: Thea Gist, PA-C   ANESTHESIA:   general  EBL:  50 mL   BLOOD ADMINISTERED:none  DRAINS: none   LOCAL MEDICATIONS USED:  NONE  SPECIMEN:  No Specimen  DISPOSITION OF SPECIMEN:  N/A  COUNTS:  YES  TOURNIQUET:  * No tourniquets in log *  DICTATION: on [facility] dictation system  PLAN OF CARE: Discharge to home after PACU  PATIENT DISPOSITION:  PACU - hemodynamically stable.   Delay start of Pharmacological VTE agent (>24hrs) due to surgical blood loss or risk of bleeding: yes, no blood thinners today. Will restart tomorrow

## 2023-06-20 NOTE — Progress Notes (Signed)
PERIOPERATIVE PRESCRIPTION FOR IMPLANTED CARDIAC DEVICE PROGRAMMING  Patient Information: Name:  Gregory [name]  DOB:  05-Feb-1961  MRN:  [id number]    Planned Procedure:  left reverse shoulder arthroplasty revision  Surgeon:  Dr Ranell Patrick  Date of Procedure:  06/20/23  Cautery will be used.  Position during surgery:  supine/beach chair   Please send documentation back to:  [facility] (Fax # [phone])  Device Information:  Clinic EP Physician:  Sherryl Manges, MD   Device Type:  Defibrillator Manufacturer and Phone #:  [employer]: [phone] Pacemaker Dependent?:  No. Date of Last Device Check:  05/22/2023 Normal Device Function?:  Yes.    Electrophysiologist's Recommendations:  Have magnet available. Provide continuous ECG monitoring when magnet is used or reprogramming is to be performed.  Procedure will likely interfere with device function.  Device should be programmed:  Tachy therapies disabled  Per Device Clinic Standing Orders, Lenor Coffin, RN  9:31 AM 06/20/2023

## 2023-06-20 NOTE — Discharge Instructions (Signed)
Ice to the shoulder constantly.  Leave the surgical bandage on until Monday, then change to the Aquacel (silver pkg). Keep the incision covered and clean and dry for one week, then ok to remove the Aquacel and then get it wet in the shower.  Do gentle exercise as instructed several times per day.  DO NOT reach behind your back or push up out of a chair with the operative arm.  Use a sling while you are up and around for comfort, may remove while seated.  Keep pillow propped behind the operative elbow. Keep you elbow propped forward where you can always see it. Wear the sling to sleep at night  Follow up with Dr Ranell Patrick in two weeks in the office, call [phone] for appt

## 2023-06-21 ENCOUNTER — Encounter (HOSPITAL_COMMUNITY): Payer: Self-pay | Admitting: Orthopedic Surgery

## 2023-06-21 LAB — GLUCOSE, CAPILLARY: Glucose-Capillary: 258 mg/dL — ABNORMAL HIGH (ref 70–99)

## 2023-06-21 MED ORDER — CEPHALEXIN 500 MG PO CAPS: 500.0000 mg | ORAL_CAPSULE | Freq: Three times a day (TID) | ORAL | 0 refills | Status: AC

## 2023-06-21 MED ORDER — CEFAZOLIN SODIUM-DEXTROSE 2-4 GM/100ML-% IV SOLN: 2.0000 g | Freq: Three times a day (TID) | INTRAVENOUS | Status: DC

## 2023-06-21 NOTE — Progress Notes (Signed)
Orthopedics Progress Note  Subjective: Patient feeling more alert this AM. Pain controlled  Objective:  Vitals:   06/21/23 0136 06/21/23 0518  BP: 119/86 123/80  Pulse: 79 83  Resp: 16 16  Temp: 98 F (36.7 C) 98.8 F (37.1 C)  SpO2: 94% 94%    General: Awake and alert  Musculoskeletal: Left shoulder wound CDI, dressing changed to Aquacel Neurovascularly intact  Lab Results  Component Value Date   WBC 6.3 05/30/2023   HGB 12.8 (L) 06/07/2023   HCT 39.6 06/07/2023   MCV 89.2 05/30/2023   PLT 204 05/30/2023       Component Value Date/Time   NA 136 06/07/2023 0415   NA 138 04/30/2023 1050   NA 139 04/28/2017 1330   K 4.9 06/07/2023 0415   K 4.2 04/28/2017 1330   CL 102 06/07/2023 0415   CO2 24 06/07/2023 0415   CO2 30 (H) 04/28/2017 1330   GLUCOSE 310 (H) 06/07/2023 0415   GLUCOSE 178 (H) 04/28/2017 1330   GLUCOSE 98 05/23/2006 0733   BUN 24 (H) 06/07/2023 0415   BUN 22 04/30/2023 1050   BUN 15.5 04/28/2017 1330   CREATININE 1.14 06/07/2023 0415   CREATININE 1.12 01/10/2023 1434   CREATININE 1.0 04/28/2017 1330   CALCIUM 8.4 (L) 06/07/2023 0415   CALCIUM 9.7 04/28/2017 1330   GFRNONAA >60 06/07/2023 0415   GFRNONAA 58 (L) 12/15/2018 0743   GFRAA >60 01/03/2020 0849   GFRAA >60 12/15/2018 0743    Lab Results  Component Value Date   INR 0.9 10/27/2019   INR 1.20 (L) 04/28/2017   INR 0.99 04/19/2016   PROTIME 14.4 (H) 04/28/2017    Assessment/Plan: POD #1 s/p Procedure(s): REVERSE SHOULDER ARTHROPLASTY REVISION Stable for discharge after Ancef dose this AM  Viviann Spare R. Ranell Patrick, MD 06/21/2023 8:18 AM

## 2023-06-21 NOTE — Progress Notes (Signed)
   06/21/23 1244  TOC Brief Assessment  Insurance and Status Reviewed  Home environment has been reviewed Home  Prior level of function: Independent  Prior/Current Home Services No current home services  Social Drivers of Health Review SDOH reviewed no interventions necessary  Readmission risk has been reviewed Yes  Transition of care needs no transition of care needs at this time

## 2023-06-21 NOTE — Discharge Summary (Signed)
In most cases prophylactic antibiotics for Dental procdeures after total joint surgery are not necessary.  Exceptions are as follows:  1. History of prior total joint infection  2. Severely immunocompromised (Organ Transplant, cancer chemotherapy, Rheumatoid biologic meds such as Humera)  3. Poorly controlled diabetes (A1C &gt; 8.0, blood glucose over 200)  If you have one of these conditions, contact your surgeon for an antibiotic prescription, prior to your dental procedure. Orthopedic Discharge Summary        Physician Discharge Summary  Patient ID: Gregory [name] MRN: [id number] DOB/AGE: 10-17-1960 63 y.o.  Admit date: 06/20/2023 Discharge date: 06/21/2023   Procedures:  Procedure(s) (LRB): REVERSE SHOULDER ARTHROPLASTY REVISION (Left)  Attending Physician:  Dr. Malon Kindle  Admission Diagnoses:   left shoulder reverse TSA dislocation  Discharge Diagnoses:  same   Past Medical History:  Diagnosis Date   Allergy    Anemia    low iron   Anxiety    Cancer (HCC)    Renal cell cancer   CHF (congestive heart failure) (HCC)    Complex tear of lateral meniscus of right knee as current injury 03/20/2017   Complex tear of medial meniscus of right knee 03/20/2017   Diabetes mellitus without complication (HCC)    History of gastrointestinal hemorrhage    Hypertension    Hypertriglyceridemia    Hypothyroidism    Macular degeneration    lasar surgery- Dr [name]   Neuropathy    Nonischemic cardiomyopathy (HCC)    EF 25-30% by echo 09/12/16   Plantar warts    PONV (postoperative nausea and vomiting)    " a little bit of nausea"   Primary localized osteoarthritis of right knee 03/20/2017   Renal cell carcinoma (HCC)    s/p nephrectomy 02/07/16 at [facility])    PCP: [name], MD   Discharged Condition: good  Hospital Course:  Patient underwent the above stated procedure on 06/20/2023. Patient tolerated the procedure well and brought to the recovery  room in good condition and subsequently to the floor. Patient had an uncomplicated hospital course and was stable for discharge.   Disposition: Discharge disposition: 01-Home or Self Care      with follow up in 2 weeks    Follow-up Information     [name], MD. Call in 1 week(s).   Specialty: Orthopedic Surgery Why: call [phone] for appt next week Contact information: [address] [place] [address] [phone]                 Dental Antibiotics:  In most cases prophylactic antibiotics for Dental procdeures after total joint surgery are not necessary.  Exceptions are as follows:  1. History of prior total joint infection  2. Severely immunocompromised (Organ Transplant, cancer chemotherapy, Rheumatoid biologic meds such as Humera)  3. Poorly controlled diabetes (A1C &gt; 8.0, blood glucose over 200)  If you have one of these conditions, contact your surgeon for an antibiotic prescription, prior to your dental procedure.  Discharge Instructions     Call MD / Call 911   Complete by: As directed    If you experience chest pain or shortness of breath, CALL 911 and be transported to the hospital emergency room.  If you develope a fever above 101 F, pus (white drainage) or increased drainage or redness at the wound, or calf pain, call your surgeon's office.   Call MD / Call 911   Complete by: As directed  If you experience chest pain or shortness of breath, CALL 911 and be transported to the hospital emergency room.  If you develope a fever above 101 F, pus (white drainage) or increased drainage or redness at the wound, or calf pain, call your surgeon's office.   Constipation Prevention   Complete by: As directed    Drink plenty of fluids.  Prune juice may be helpful.  You may use a stool softener, such as Colace (over the counter) 100 mg twice a day.  Use MiraLax (over the counter) for constipation as needed.   Constipation  Prevention   Complete by: As directed    Drink plenty of fluids.  Prune juice may be helpful.  You may use a stool softener, such as Colace (over the counter) 100 mg twice a day.  Use MiraLax (over the counter) for constipation as needed.   Diet - low sodium heart healthy   Complete by: As directed    Increase activity slowly as tolerated   Complete by: As directed    Increase activity slowly as tolerated   Complete by: As directed    Post-operative opioid taper instructions:   Complete by: As directed    POST-OPERATIVE OPIOID TAPER INSTRUCTIONS: It is important to wean off of your opioid medication as soon as possible. If you do not need pain medication after your surgery it is ok to stop day one. Opioids include: Codeine, Hydrocodone(Norco, Vicodin), Oxycodone(Percocet, oxycontin) and hydromorphone amongst others.  Long term and even short term use of opiods can cause: Increased pain response Dependence Constipation Depression Respiratory depression And more.  Withdrawal symptoms can include Flu like symptoms Nausea, vomiting And more Techniques to manage these symptoms Hydrate well Eat regular healthy meals Stay active Use relaxation techniques(deep breathing, meditating, yoga) Do Not substitute Alcohol to help with tapering If you have been on opioids for less than two weeks and do not have pain than it is ok to stop all together.  Plan to wean off of opioids This plan should start within one week post op of your joint replacement. Maintain the same interval or time between taking each dose and first decrease the dose.  Cut the total daily intake of opioids by one tablet each day Next start to increase the time between doses. The last dose that should be eliminated is the evening dose.      Post-operative opioid taper instructions:   Complete by: As directed    POST-OPERATIVE OPIOID TAPER INSTRUCTIONS: It is important to wean off of your opioid medication as soon as  possible. If you do not need pain medication after your surgery it is ok to stop day one. Opioids include: Codeine, Hydrocodone(Norco, Vicodin), Oxycodone(Percocet, oxycontin) and hydromorphone amongst others.  Long term and even short term use of opiods can cause: Increased pain response Dependence Constipation Depression Respiratory depression And more.  Withdrawal symptoms can include Flu like symptoms Nausea, vomiting And more Techniques to manage these symptoms Hydrate well Eat regular healthy meals Stay active Use relaxation techniques(deep breathing, meditating, yoga) Do Not substitute Alcohol to help with tapering If you have been on opioids for less than two weeks and do not have pain than it is ok to stop all together.  Plan to wean off of opioids This plan should start within one week post op of your joint replacement. Maintain the same interval or time between taking each dose and first decrease the dose.  Cut the total daily intake of opioids by one  tablet each day Next start to increase the time between doses. The last dose that should be eliminated is the evening dose.          Allergies as of 06/21/2023   No Known Allergies      Medication List     TAKE these medications    atorvastatin 80 MG tablet Commonly known as: LIPITOR TAKE 1 TABLET(80 MG) BY MOUTH DAILY   carvedilol 25 MG tablet Commonly known as: COREG TAKE 1 TABLET(25 MG) BY MOUTH TWICE DAILY WITH A MEAL   cephALEXin 500 MG capsule Commonly known as: KEFLEX Take 1 capsule (500 mg total) by mouth 3 (three) times daily for 7 days.   cetirizine 10 MG tablet Commonly known as: ZYRTEC Take 10 mg by mouth daily.   Cholecalciferol 50 MCG (2000 UT) Tabs Take 2 tablets (4,000 Units total) by mouth daily. What changed: when to take this   digoxin 0.125 MG tablet Commonly known as: LANOXIN TAKE 1 TABLET(125 MCG) BY MOUTH DAILY   Entresto 97-103 MG Generic drug:  sacubitril-valsartan TAKE 1 TABLET BY MOUTH TWICE DAILY   Fiasp FlexTouch 100 UNIT/ML FlexTouch Pen Generic drug: insulin aspart Inject 24-40 Units into the skin in the morning, at noon, and at bedtime.   fluticasone 0.05 % cream Commonly known as: CUTIVATE Apply 1 Application topically daily as needed (irritation).   FreeStyle Libre 14 Day Sensor Misc U UTD   furosemide 40 MG tablet Commonly known as: LASIX TAKE 1 TABLET BY MOUTH THREE TIMES A WEEK, ON MONDAY, WEDNESDAY, AND FRIDAY. MAY TAKE ADDITIONAL TABLETS AS NEEDED.   levothyroxine 25 MCG tablet Commonly known as: SYNTHROID TAKE 1 TABLET ONCE DAILY BEFORE BREAKFAST   metFORMIN 500 MG 24 hr tablet Commonly known as: GLUCOPHAGE-XR Take 500 mg by mouth daily.   methocarbamol 500 MG tablet Commonly known as: ROBAXIN Take 1 tablet (500 mg total) by mouth every 8 (eight) hours as needed for muscle spasms.   Mounjaro 15 MG/0.5ML Pen Generic drug: tirzepatide Inject 15 mg into the skin once a week.   oxyCODONE-acetaminophen 5-325 MG tablet Commonly known as: Percocet Take 1 tablet by mouth every 4 (four) hours as needed for severe pain (pain score 7-10).   PARoxetine 30 MG tablet Commonly known as: PAXIL TAKE 1 TABLET(30 MG) BY MOUTH DAILY   spironolactone 25 MG tablet Commonly known as: ALDACTONE TAKE 1/2 TABLET(12.5 MG) BY MOUTH DAILY   thiamine 100 MG tablet Commonly known as: VITAMIN B1 TAKE 1 TABLET(100 MG) BY MOUTH EVERY OTHER DAY   Tresiba FlexTouch 100 UNIT/ML FlexTouch Pen Generic drug: insulin degludec Inject 10 Units into the skin at bedtime.   TYLENOL PO Take 400 mg by mouth 2 (two) times daily.          Signed: Verlee Rossetti 06/21/2023, 8:22 AM  [facility] is now [facility] [address], [address], [place], [place] [address] Phone: [phone] Facebook  Instagram  [employer]

## 2023-06-21 NOTE — Plan of Care (Signed)
  Problem: Clinical Measurements: Goal: Ability to maintain clinical measurements within normal limits will improve Outcome: Progressing   Problem: Education: Goal: Knowledge of the prescribed therapeutic regimen will improve Outcome: Progressing   Problem: Activity: Goal: Ability to tolerate increased activity will improve Outcome: Progressing   Problem: Pain Management: Goal: Pain level will decrease with appropriate interventions Outcome: Progressing

## 2023-06-23 ENCOUNTER — Telehealth: Payer: Self-pay | Admitting: *Deleted

## 2023-06-23 NOTE — Transitions of Care (Post Inpatient/ED Visit) (Signed)
   06/23/2023  Name: Gregory [name] MRN: [id number] DOB: 04-Sep-1960  Today's TOC FU Call Status: Today's TOC FU Call Status:: Unsuccessful Call (1st Attempt) Unsuccessful Call (1st Attempt) Date: 06/23/23  Attempted to reach the patient regarding the most recent Inpatient visit; left HIPAA compliant voice message requesting call back  Follow Up Plan: Additional outreach attempts will be made to reach the patient to complete the Transitions of Care (Post Inpatient visit) call.   Caryl Pina, RN, BSN, Media planner  Transitions of Care  [facility] [phone]: direct office

## 2023-06-24 ENCOUNTER — Telehealth: Payer: Self-pay | Admitting: *Deleted

## 2023-06-24 NOTE — Transitions of Care (Post Inpatient/ED Visit) (Signed)
06/24/2023  Name: Gregory [name] MRN: [id number] DOB: 1960/08/04  Today's TOC FU Call Status: Today's TOC FU Call Status:: Successful TOC FU Call Completed TOC FU Call Complete Date: 06/24/23 Patient's Name and Date of Birth confirmed.  Transition Care Management Follow-up Telephone Call Date of Discharge: 06/21/23 Discharge Facility: [facility]) Type of Discharge: Inpatient Admission Primary Inpatient Discharge Diagnosis:: Surgical (L) reverse shoulder arthroplasty How have you been since you were released from the hospital?: Better ("I am doing fine; going to pick up the antibiotics today- he just prescribed them as a precaution after surgery, so i will pick them up and start them today.  I am completely independent after my surgery- not having any problems at all, back at work") Any questions or concerns?: No  Items Reviewed: Did you receive and understand the discharge instructions provided?: Yes (thoroughly reviewed with patient who verbalizes good understanding of same) Medications obtained,verified, and reconciled?: Yes (Medications Reviewed) (Full medication reconciliation/ review completed; no concerns or discrepancies identified; confirmed patient is going to obtain newly Rx'd medications as instructed; self-manages medications and denies questions/ concerns around medications today) Any new allergies since your discharge?: No Dietary orders reviewed?: Yes Type of Diet Ordered:: "Healthy as possible" Do you have support at home?: Yes People in Home: spouse Name of Support/Comfort Primary Source: Reports independent in self-care activities; supportive spouse assists as/ if needed/ indicated  Medications Reviewed Today: Medications Reviewed Today     Reviewed by Michaela Corner, RN (Registered Nurse) on 06/24/23 at 1045  Med List Status: <None>   Medication Order Taking? Sig Documenting Provider Last Dose Status Informant  Acetaminophen (TYLENOL PO) [id number] Yes Take 400  mg by mouth 2 (two) times daily. [provider] Taking Active   atorvastatin (LIPITOR) 80 MG tablet [id number] Yes TAKE 1 TABLET(80 MG) BY MOUTH DAILY Bensimhon, Bevelyn Buckles, MD Taking Active Self  carvedilol (COREG) 25 MG tablet [id number] Yes TAKE 1 TABLET(25 MG) BY MOUTH TWICE DAILY WITH A MEAL Bensimhon, Bevelyn Buckles, MD Taking Active Self  cephALEXin (KEFLEX) 500 MG capsule [id number] Yes Take 1 capsule (500 mg total) by mouth 3 (three) times daily for 7 days. [name], MD Taking Active            Med Note Michaela Corner   Tue Jun 24, 2023 10:43 AM) 06/24/23: reports during [facility] call- he is planning to pick up "today;" reports was prescribed as a "post-op precaution"-- encouraged patient to obtain/ start taking today- he verbalizes understanding/ agreement, states on way to pick up "now"  cetirizine (ZYRTEC) 10 MG tablet [id number] Yes Take 10 mg by mouth daily. [provider] Taking Active Self  Cholecalciferol 50 MCG (2000 UT) TABS [id number] Yes Take 2 tablets (4,000 Units total) by mouth daily.  Patient taking differently: Take 4,000 Units by mouth 2 (two) times daily.   [name], MD Taking Active Self  digoxin (LANOXIN) 0.125 MG tablet [id number] Yes TAKE 1 TABLET(125 MCG) BY MOUTH DAILY Bensimhon, Bevelyn Buckles, MD Taking Active Self  ENTRESTO 97-103 MG [id number] Yes TAKE 1 TABLET BY MOUTH TWICE DAILY Bensimhon, Bevelyn Buckles, MD Taking Active Self  FIASP FLEXTOUCH 100 UNIT/ML FlexTouch Pen [id number] Yes Inject 24-40 Units into the skin in the morning, at noon, and at bedtime. [provider] Taking Active Self           Med Note (CRUTHIS, CHLOE C   Sat Jun 21, 2023  8:32 AM)    fluticasone (CUTIVATE) 0.05 %  cream [id number] No Apply 1 Application topically daily as needed (irritation).  Patient not taking: Reported on 06/24/2023   [provider] Not Taking Active Self  furosemide (LASIX) 40 MG tablet [id number] Yes TAKE 1 TABLET BY MOUTH THREE TIMES A WEEK, ON  MONDAY, WEDNESDAY, AND FRIDAY. MAY TAKE ADDITIONAL TABLETS AS NEEDED. Bensimhon, Bevelyn Buckles, MD Taking Active Self           Med Note Michaela Corner   Tue Jun 24, 2023 10:44 AM) 06/24/23: Reports during TOC call, he was advised by cardiologist to only take "as needed;" confirms not taking regularly- has at home if needed; has not needed recently   levothyroxine (SYNTHROID, LEVOTHROID) 25 MCG tablet [id number] Yes TAKE 1 TABLET ONCE DAILY BEFORE BREAKFAST Curcio, Reita May, NP Taking Active Self  metFORMIN (GLUCOPHAGE-XR) 500 MG 24 hr tablet [id number] Yes Take 500 mg by mouth daily. [provider] Taking Active Self  methocarbamol (ROBAXIN) 500 MG tablet [id number] Yes Take 1 tablet (500 mg total) by mouth every 8 (eight) hours as needed for muscle spasms. [name], MD Taking Active   oxyCODONE-acetaminophen (PERCOCET) 5-325 MG tablet [id number] Yes Take 1 tablet by mouth every 4 (four) hours as needed for severe pain (pain score 7-10). [name], MD Taking Active            Med Note Michaela Corner   Tue Jun 24, 2023 10:45 AM) 06/24/23: Reports during TOC call, has not needed to take very often post-recent surgery- reports tylenol is controlling post-op pain  PARoxetine (PAXIL) 30 MG tablet [id number] Yes TAKE 1 TABLET(30 MG) BY MOUTH DAILY [name], MD Taking Active Self  spironolactone (ALDACTONE) 25 MG tablet [id number] Yes TAKE 1/2 TABLET(12.5 MG) BY MOUTH DAILY Bensimhon, Bevelyn Buckles, MD Taking Active Self  thiamine (VITAMIN B1) 100 MG tablet [id number] Yes TAKE 1 TABLET(100 MG) BY MOUTH EVERY OTHER DAY [name], MD Taking Active Self  tirzepatide [facility]) 15 MG/0.5ML Pen [id number] Yes Inject 15 mg into the skin once a week. [name], MD Taking Active Self  TRESIBA FLEXTOUCH 100 UNIT/ML FlexTouch Pen [id number] Yes Inject 10 Units into the skin at bedtime. [provider] Taking Active Self           Med Note (CRUTHIS, CHLOE C   Sat Jun 21, 2023  8:32  AM)             Home Care and Equipment/Supplies: Were Home Health Services Ordered?: No Any new equipment or medical supplies ordered?: No  Functional Questionnaire: Do you need assistance with bathing/showering or dressing?: No Do you need assistance with meal preparation?: No Do you need assistance with eating?: No Do you have difficulty maintaining continence: No Do you need assistance with getting out of bed/getting out of a chair/moving?: No Do you have difficulty managing or taking your medications?: No  Follow up appointments reviewed: PCP Follow-up appointment confirmed?: NA (verified not indicated per hospital discharging provider discharge notes) Specialist Hospital Follow-up appointment confirmed?: Yes Date of Specialist follow-up appointment?: 06/16/23 Follow-Up Specialty Provider:: orthopedic surgery provider Do you need transportation to your follow-up appointment?: No Do you understand care options if your condition(s) worsen?: Yes-patient verbalized understanding  SDOH Interventions Today    Flowsheet Row Most Recent Value  SDOH Interventions   Food Insecurity Interventions Intervention Not Indicated  Housing Interventions Intervention Not Indicated  Transportation Interventions Intervention Not Indicated  [drives self at baseline,  wife assisting post-recent shoulder surgery]  Utilities  Interventions Intervention Not Indicated      Interventions Today    Flowsheet Row Most Recent Value  Chronic Disease   Chronic disease during today's visit Other  [orthopedic surgery re-do]  General Interventions   General Interventions Discussed/Reviewed General Interventions Reviewed, Durable Medical Equipment (DME), Doctor Visits  Doctor Visits Discussed/Reviewed Doctor Visits Discussed, Specialist  Durable Medical Equipment (DME) Other  [confirmed not currently requiring/ using assistive devices for ambulation]  PCP/Specialist Visits Compliance with follow-up visit   Exercise Interventions   Exercise Discussed/Reviewed Exercise Discussed  [benefit of conservative post-op activity,  need to pace activity without over-doing- confirmed patient is ambulating frequently around home as instructed at time of hospital discharge]  Education Interventions   Education Provided Provided Education  Provided Verbal Education On Medication  [side effects of narcotics,  need to obtain/ start taking prophylactic antibiotics as instructed post- recent surgery]  Nutrition Interventions   Nutrition Discussed/Reviewed Nutrition Discussed  Pharmacy Interventions   Pharmacy Dicussed/Reviewed Pharmacy Topics Discussed  [Full medication review with updating medication list in EHR per patient report]  Safety Interventions   Safety Discussed/Reviewed Safety Discussed       TOC Interventions Today    Flowsheet Row Most Recent Value  TOC Interventions   TOC Interventions Discussed/Reviewed TOC Interventions Discussed  [Patient declines need for ongoing/ further care management outreach,  declines enrollment in 30-day TOC program,  provided my direct contact information should questions/ concerns/ needs arise post-TOC call]      Caryl Pina, RN, BSN, CCRN Alumnus RN Care Manager  Transitions of Care  [facility] - [facility]  [place] [phone]: direct office

## 2023-06-24 NOTE — Transitions of Care (Post Inpatient/ED Visit) (Signed)
   06/24/2023  Name: Gregory [name] MRN: [id number] DOB: [birth year]  Today's TOC FU Call Status: Today's TOC FU Call Status:: Unsuccessful Call (2nd Attempt) Unsuccessful Call (2nd Attempt) Date: 06/24/23  Attempted to reach the patient regarding the most recent Inpatient visit; left HIPAA compliant voice message requesting call back  Follow Up Plan: Additional outreach attempts will be made to reach the patient to complete the Transitions of Care (Post Inpatient visit) call.   Caryl Pina, RN, BSN, Media planner  Transitions of Care  [facility] - [facility] [phone]: direct office

## 2023-06-26 DIAGNOSIS — Z4789 Encounter for other orthopedic aftercare: Secondary | ICD-10-CM | POA: Diagnosis not present

## 2023-07-03 DIAGNOSIS — Z4789 Encounter for other orthopedic aftercare: Secondary | ICD-10-CM | POA: Diagnosis not present

## 2023-07-07 LAB — FUNGUS CULTURE WITH STAIN

## 2023-07-07 LAB — FUNGAL ORGANISM REFLEX

## 2023-07-07 LAB — FUNGUS CULTURE RESULT

## 2023-07-10 ENCOUNTER — Encounter: Payer: BC Managed Care – PPO | Admitting: Internal Medicine

## 2023-07-11 DIAGNOSIS — E349 Endocrine disorder, unspecified: Secondary | ICD-10-CM | POA: Diagnosis not present

## 2023-07-11 DIAGNOSIS — E1165 Type 2 diabetes mellitus with hyperglycemia: Secondary | ICD-10-CM | POA: Diagnosis not present

## 2023-07-11 DIAGNOSIS — Z794 Long term (current) use of insulin: Secondary | ICD-10-CM | POA: Diagnosis not present

## 2023-07-11 DIAGNOSIS — E039 Hypothyroidism, unspecified: Secondary | ICD-10-CM | POA: Diagnosis not present

## 2023-07-15 ENCOUNTER — Encounter: Payer: Self-pay | Admitting: Internal Medicine

## 2023-07-15 ENCOUNTER — Other Ambulatory Visit (INDEPENDENT_AMBULATORY_CARE_PROVIDER_SITE_OTHER): Payer: BC Managed Care – PPO

## 2023-07-15 ENCOUNTER — Other Ambulatory Visit: Payer: Self-pay | Admitting: Internal Medicine

## 2023-07-15 ENCOUNTER — Ambulatory Visit: Payer: Self-pay | Admitting: Internal Medicine

## 2023-07-15 DIAGNOSIS — R300 Dysuria: Secondary | ICD-10-CM | POA: Diagnosis not present

## 2023-07-15 DIAGNOSIS — N3000 Acute cystitis without hematuria: Secondary | ICD-10-CM

## 2023-07-15 LAB — URINALYSIS, ROUTINE W REFLEX MICROSCOPIC
Nitrite: NEGATIVE
Specific Gravity, Urine: >=1.03 — AB (ref 1.000–1.030)
Total Protein, Urine: >=300 — AB
Urine Glucose: NEGATIVE
Urobilinogen, UA: 4 — AB (ref 0.0–1.0)
pH: 6 (ref 5.0–8.0)

## 2023-07-15 MED ORDER — SULFAMETHOXAZOLE-TRIMETHOPRIM 800-160 MG PO TABS: 1.0000 | ORAL_TABLET | Freq: Two times a day (BID) | ORAL | 0 refills | Status: AC

## 2023-07-15 NOTE — Telephone Encounter (Signed)
He came in the office before I could reach out to him. I advised him he would have to be seen for you to do a UA on him. There are no appointments available today for him to be seen. He stated that he's going to urgent care.

## 2023-07-15 NOTE — Telephone Encounter (Signed)
Copied from CRM [id number]. Topic: Clinical - Red Word Triage >> Jul 15, 2023  9:20 AM Pascal Lux wrote: Red Word that prompted transfer to Nurse Triage: Sunday night patient experienced a fever that broke, experiencing low grade fever. Thinks he may have a UTI in some pain.  Chief Complaint: pain and frequency with urination, fever, chills, low volume of urine Symptoms: see above Frequency: constant Pertinent Negatives: Patient denies flank pain, blood in urine Disposition: [] ED /[] Urgent Care (no appt availability in office) / [] Appointment(In office/virtual)/ []  [facility]/ [] Home Care/ [] Refused Recommended Disposition /[] [place] [facility]/ [x]  Follow-up with PCP Additional Notes: no apt available today; hx UTI and does have urologist.  Patient requests to drop urine sample off to office today.  Office updated and to call patient back.  Instructed to go to UC/ER if becomes worse.   Reason for Disposition  Urinating more frequently than usual (i.e., frequency)  Answer Assessment - Initial Assessment Questions 1. SYMPTOM: "What's the main symptom you're concerned about?" (e.g., frequency, incontinence)     Chills, fever, pain with urination, frequency of urination 2. ONSET: "When did the  symptoms  start?"     Sunday night 3. PAIN: "Is there any pain?" If Yes, ask: "How bad is it?" (Scale: 1-10; mild, moderate, severe)     5-6/10 4. CAUSE: "What do you think is causing the symptoms?"     UTI 5. OTHER SYMPTOMS: "Do you have any other symptoms?" (e.g., blood in urine, fever, flank pain, pain with urination)     Fever, pain with urination  6. PREGNANCY: "Is there any chance you are pregnant?" "When was your last menstrual period?"     na  Protocols used: Urinary Symptoms-A-AH

## 2023-07-15 NOTE — Telephone Encounter (Signed)
 Patient has been made aware.

## 2023-07-20 ENCOUNTER — Other Ambulatory Visit: Payer: Self-pay | Admitting: Internal Medicine

## 2023-07-20 LAB — CULTURE, URINE COMPREHENSIVE

## 2023-07-23 ENCOUNTER — Other Ambulatory Visit: Payer: Self-pay | Admitting: Internal Medicine

## 2023-07-23 DIAGNOSIS — F418 Other specified anxiety disorders: Secondary | ICD-10-CM

## 2023-07-24 ENCOUNTER — Other Ambulatory Visit (HOSPITAL_COMMUNITY): Payer: Self-pay

## 2023-07-24 DIAGNOSIS — I5022 Chronic systolic (congestive) heart failure: Secondary | ICD-10-CM

## 2023-07-24 MED ORDER — ATORVASTATIN CALCIUM 80 MG PO TABS: 80.0000 mg | ORAL_TABLET | Freq: Every day | ORAL | 1 refills | Status: DC

## 2023-07-26 LAB — ACID FAST CULTURE WITH REFLEXED SENSITIVITIES (MYCOBACTERIA): Acid Fast Culture: NEGATIVE

## 2023-07-29 DIAGNOSIS — Z4789 Encounter for other orthopedic aftercare: Secondary | ICD-10-CM | POA: Diagnosis not present

## 2023-07-30 DIAGNOSIS — Z7985 Long-term (current) use of injectable non-insulin antidiabetic drugs: Secondary | ICD-10-CM | POA: Diagnosis not present

## 2023-07-30 DIAGNOSIS — Z85528 Personal history of other malignant neoplasm of kidney: Secondary | ICD-10-CM | POA: Diagnosis not present

## 2023-07-30 DIAGNOSIS — Z79899 Other long term (current) drug therapy: Secondary | ICD-10-CM | POA: Diagnosis not present

## 2023-07-30 DIAGNOSIS — C7802 Secondary malignant neoplasm of left lung: Secondary | ICD-10-CM | POA: Diagnosis not present

## 2023-07-30 DIAGNOSIS — Z905 Acquired absence of kidney: Secondary | ICD-10-CM | POA: Diagnosis not present

## 2023-07-30 DIAGNOSIS — C641 Malignant neoplasm of right kidney, except renal pelvis: Secondary | ICD-10-CM | POA: Diagnosis not present

## 2023-07-30 DIAGNOSIS — R918 Other nonspecific abnormal finding of lung field: Secondary | ICD-10-CM | POA: Diagnosis not present

## 2023-07-30 DIAGNOSIS — Z794 Long term (current) use of insulin: Secondary | ICD-10-CM | POA: Diagnosis not present

## 2023-07-30 DIAGNOSIS — Z79890 Hormone replacement therapy (postmenopausal): Secondary | ICD-10-CM | POA: Diagnosis not present

## 2023-07-31 DIAGNOSIS — C641 Malignant neoplasm of right kidney, except renal pelvis: Secondary | ICD-10-CM | POA: Diagnosis not present

## 2023-07-31 DIAGNOSIS — C7802 Secondary malignant neoplasm of left lung: Secondary | ICD-10-CM | POA: Diagnosis not present

## 2023-08-06 ENCOUNTER — Ambulatory Visit: Payer: BC Managed Care – PPO

## 2023-08-06 DIAGNOSIS — I428 Other cardiomyopathies: Secondary | ICD-10-CM | POA: Diagnosis not present

## 2023-08-06 LAB — CUP PACEART REMOTE DEVICE CHECK
Battery Remaining Longevity: 163 mo
Battery Voltage: 3.07 V
Brady Statistic RA Percent Paced: INVALID
Brady Statistic RV Percent Paced: 0.01 %
Date Time Interrogation Session: 20250312062226
HighPow Impedance: 54 Ohm
Implantable Lead Connection Status: 753985
Implantable Lead Implant Date: 20241209
Implantable Lead Location: 753860
Implantable Pulse Generator Implant Date: 20241209
Lead Channel Impedance Value: 361 Ohm
Lead Channel Impedance Value: 437 Ohm
Lead Channel Pacing Threshold Amplitude: 0.5 V
Lead Channel Pacing Threshold Pulse Width: 0.4 ms
Lead Channel Sensing Intrinsic Amplitude: 10.1 mV
Lead Channel Setting Pacing Amplitude: 3.5 V
Lead Channel Setting Pacing Pulse Width: 0.4 ms
Lead Channel Setting Sensing Sensitivity: 0.3 mV
Zone Setting Status: 755011

## 2023-08-11 ENCOUNTER — Ambulatory Visit: Payer: Self-pay | Admitting: Internal Medicine

## 2023-08-11 ENCOUNTER — Ambulatory Visit: Admitting: Family Medicine

## 2023-08-11 ENCOUNTER — Encounter: Payer: Self-pay | Admitting: Family Medicine

## 2023-08-11 VITALS — BP 126/72 | HR 87 | Temp 99.0°F | Resp 18 | Ht 74.0 in | Wt 266.0 lb

## 2023-08-11 DIAGNOSIS — J988 Other specified respiratory disorders: Secondary | ICD-10-CM | POA: Diagnosis not present

## 2023-08-11 DIAGNOSIS — B9689 Other specified bacterial agents as the cause of diseases classified elsewhere: Secondary | ICD-10-CM | POA: Diagnosis not present

## 2023-08-11 DIAGNOSIS — N480 Leukoplakia of penis: Secondary | ICD-10-CM | POA: Diagnosis not present

## 2023-08-11 DIAGNOSIS — N390 Urinary tract infection, site not specified: Secondary | ICD-10-CM | POA: Diagnosis not present

## 2023-08-11 DIAGNOSIS — B952 Enterococcus as the cause of diseases classified elsewhere: Secondary | ICD-10-CM | POA: Diagnosis not present

## 2023-08-11 DIAGNOSIS — Z85528 Personal history of other malignant neoplasm of kidney: Secondary | ICD-10-CM | POA: Diagnosis not present

## 2023-08-11 MED ORDER — AZITHROMYCIN 250 MG PO TABS: ORAL_TABLET | ORAL | 0 refills | Status: AC

## 2023-08-11 NOTE — Telephone Encounter (Signed)
  Chief Complaint: productive cough Symptoms: wheezing/headache  Disposition: [] ED /[] Urgent Care (no appt availability in office) / [x] Appointment(In office/virtual)/ []  [facility]/ [] Home Care/ [] Refused Recommended Disposition /[] [facility]/ []  Follow-up with PCP Additional Notes: Pt complaining of productive cough with dark green mucus. Pt has had cough for a week. Pt feels he has had fever on and off with chills. Pt states he has wheezing and headache. Pt denies any SOB. Pt has appt this morning at 1040. RN gave care advice and pt verbalized understanding.               Copied from CRM [id number]. Topic: Clinical - Red Word Triage >> Aug 11, 2023  9:31 AM Gurney Maxin H wrote: [facility] that prompted transfer to Nurse Triage: Wheezing in chest, headache, low grade fever, coughing up and blowing out of nose dark green mucus. Reason for Disposition  [1] MILD difficulty breathing (e.g., minimal/no SOB at rest, SOB with walking, pulse <100) AND [2] still present when not coughing  Answer Assessment - Initial Assessment Questions 1. ONSET: "When did the cough begin?"      7 days ago  2. SEVERITY: "How bad is the cough today?"    Persistent  3. SPUTUM: "Describe the color of your sputum" (none, dry cough; clear, white, yellow, green)     Dark green  4. HEMOPTYSIS: "Are you coughing up any blood?" If so ask: "How much?" (flecks, streaks, tablespoons, etc.)     Denies  5. DIFFICULTY BREATHING: "Are you having difficulty breathing?" If Yes, ask: "How bad is it?" (e.g., mild, moderate, severe)    - MILD: No SOB at rest, mild SOB with walking, speaks normally in sentences, can lie down, no retractions, pulse < 100.    - MODERATE: SOB at rest, SOB with minimal exertion and prefers to sit, cannot lie down flat, speaks in phrases, mild retractions, audible wheezing, pulse 100-120.    - SEVERE: Very SOB at rest, speaks in single words, struggling to breathe, sitting hunched  forward, retractions, pulse > 120      Denies any SOB  6. FEVER: "Do you have a fever?" If Yes, ask: "What is your temperature, how was it measured, and when did it start?"     Not sure- chills  7. CARDIAC HISTORY: "Do you have any history of heart disease?" (e.g., heart attack, congestive heart failure)      Low ejection fracture  8. LUNG HISTORY: "Do you have any history of lung disease?"  (e.g., pulmonary embolus, asthma, emphysema)     Denies  9. PE RISK FACTORS: "Do you have a history of blood clots?" (or: recent major surgery, recent prolonged travel, bedridden)     Denies  10. OTHER SYMPTOMS: "Do you have any other symptoms?" (e.g., runny nose, wheezing, chest pain)       Wheezing, headache  Protocols used: Cough - Acute Productive-A-AH

## 2023-08-11 NOTE — Progress Notes (Signed)
 Assessment & Plan:  1. Bacterial respiratory infection (Primary) Education provided on URIs.  - azithromycin (ZITHROMAX) 250 MG tablet; Take 2 tablets on day 1, then 1 tablet daily on days 2 through 5  Dispense: 6 tablet; Refill: 0   Follow up plan: Return if symptoms worsen or fail to improve.  Gregory Boston, MSN, APRN, FNP-C  Subjective:  HPI: Gregory [name] is a 63 y.o. male presenting on 08/11/2023 for Sinusitis (Facial pressure, ear pain, HA, congestion of chest and head. Congestion is green. Started last week WED/THUR)  Patient complains of a productive cough, head/chest congestion, headache, ear pain/pressure, facial pain/pressure, fever, and wheezing. He denies shortness of breath. Onset of symptoms was 5 days ago, unchanged since that time. He is drinking moderate amounts of fluids. Evaluation to date: none. Treatment to date:  Mucinex . He does not smoke.    ROS: Negative unless specifically indicated above in HPI.   Relevant past medical history reviewed and updated as indicated.   Allergies and medications reviewed and updated.   Current Outpatient Medications:    Acetaminophen (TYLENOL PO), Take 400 mg by mouth 2 (two) times daily., Disp: , Rfl:    atorvastatin (LIPITOR) 80 MG tablet, Take 1 tablet (80 mg total) by mouth daily., Disp: 90 tablet, Rfl: 1   carvedilol (COREG) 25 MG tablet, TAKE 1 TABLET(25 MG) BY MOUTH TWICE DAILY WITH A MEAL, Disp: 180 tablet, Rfl: 3   cetirizine (ZYRTEC) 10 MG tablet, Take 10 mg by mouth daily., Disp: , Rfl:    Cholecalciferol 50 MCG (2000 UT) TABS, Take 2 tablets (4,000 Units total) by mouth daily. (Patient taking differently: Take 4,000 Units by mouth 2 (two) times daily.), Disp: 180 tablet, Rfl: 1   digoxin (LANOXIN) 0.125 MG tablet, TAKE 1 TABLET(125 MCG) BY MOUTH DAILY, Disp: 90 tablet, Rfl: 3   ENTRESTO 97-103 MG, TAKE 1 TABLET BY MOUTH TWICE DAILY, Disp: 180 tablet, Rfl: 3   FIASP FLEXTOUCH 100 UNIT/ML FlexTouch Pen, Inject 24-40  Units into the skin in the morning, at noon, and at bedtime., Disp: , Rfl:    fluticasone (CUTIVATE) 0.05 % cream, Apply 1 Application topically daily as needed (irritation)., Disp: , Rfl:    levothyroxine (SYNTHROID, LEVOTHROID) 25 MCG tablet, TAKE 1 TABLET ONCE DAILY BEFORE BREAKFAST, Disp: 30 tablet, Rfl: 0   metFORMIN (GLUCOPHAGE-XR) 500 MG 24 hr tablet, Take 500 mg by mouth daily., Disp: , Rfl:    PARoxetine (PAXIL) 30 MG tablet, TAKE 1 TABLET(30 MG) BY MOUTH DAILY, Disp: 90 tablet, Rfl: 0   spironolactone (ALDACTONE) 25 MG tablet, TAKE 1/2 TABLET(12.5 MG) BY MOUTH DAILY, Disp: 45 tablet, Rfl: 3   thiamine (VITAMIN B1) 100 MG tablet, TAKE 1 TABLET(100 MG) BY MOUTH EVERY OTHER DAY, Disp: 45 tablet, Rfl: 1   tirzepatide (MOUNJARO) 15 MG/0.5ML Pen, Inject 15 mg into the skin once a week., Disp: 2 mL, Rfl: 0   TRESIBA FLEXTOUCH 100 UNIT/ML FlexTouch Pen, Inject 10 Units into the skin at bedtime., Disp: , Rfl:    furosemide (LASIX) 40 MG tablet, TAKE 1 TABLET BY MOUTH THREE TIMES A WEEK, ON MONDAY, WEDNESDAY, AND FRIDAY. MAY TAKE ADDITIONAL TABLETS AS NEEDED. (Patient not taking: Reported on 08/11/2023), Disp: 180 tablet, Rfl: 0  No Known Allergies  Objective:   BP 126/72   Pulse 87   Temp 99 F (37.2 C)   Resp 18   Ht 6\' 2"  (1.88 m)   Wt 266 lb (120.7 kg)   SpO2  94%   BMI 34.15 kg/m    Physical Exam Vitals reviewed.  Constitutional:      General: He is not in acute distress.    Appearance: Normal appearance. He is not ill-appearing, toxic-appearing or diaphoretic.  HENT:     Head: Normocephalic and atraumatic.     Right Ear: Tympanic membrane, ear canal and external ear normal. There is no impacted cerumen.     Left Ear: Tympanic membrane, ear canal and external ear normal. There is no impacted cerumen.     Nose: Congestion present.     Right Sinus: No maxillary sinus tenderness or frontal sinus tenderness.     Left Sinus: No maxillary sinus tenderness or frontal sinus  tenderness.     Mouth/Throat:     Mouth: Mucous membranes are moist.     Pharynx: Oropharynx is clear. No oropharyngeal exudate or posterior oropharyngeal erythema.     Tonsils: No tonsillar exudate or tonsillar abscesses.  Eyes:     General: No scleral icterus.       Right eye: No discharge.        Left eye: No discharge.     Conjunctiva/sclera: Conjunctivae normal.  Cardiovascular:     Rate and Rhythm: Normal rate.  Pulmonary:     Effort: Pulmonary effort is normal. No respiratory distress.  Musculoskeletal:        General: Normal range of motion.     Cervical back: Normal range of motion.  Lymphadenopathy:     Cervical: No cervical adenopathy.  Skin:    General: Skin is warm and dry.  Neurological:     Mental Status: He is alert and oriented to person, place, and time. Mental status is at baseline.  Psychiatric:        Mood and Affect: Mood normal.        Behavior: Behavior normal.        Thought Content: Thought content normal.        Judgment: Judgment normal.

## 2023-08-13 ENCOUNTER — Ambulatory Visit: Payer: BC Managed Care – PPO | Attending: Internal Medicine | Admitting: Internal Medicine

## 2023-08-13 ENCOUNTER — Encounter: Payer: Self-pay | Admitting: Internal Medicine

## 2023-08-13 VITALS — BP 118/82 | HR 76 | Ht 73.0 in | Wt 261.2 lb

## 2023-08-13 DIAGNOSIS — I493 Ventricular premature depolarization: Secondary | ICD-10-CM | POA: Diagnosis not present

## 2023-08-13 DIAGNOSIS — Z4502 Encounter for adjustment and management of automatic implantable cardiac defibrillator: Secondary | ICD-10-CM

## 2023-08-13 DIAGNOSIS — I480 Paroxysmal atrial fibrillation: Secondary | ICD-10-CM | POA: Diagnosis not present

## 2023-08-13 DIAGNOSIS — R002 Palpitations: Secondary | ICD-10-CM | POA: Diagnosis not present

## 2023-08-13 NOTE — Progress Notes (Signed)
 Patient Care Team: [name], MD as PCP - General (Internal Medicine) Bensimhon, Bevelyn Buckles, MD as Consulting Physician (Cardiology)   HPI  Gregory [name] is a 63 y.o. male seen in follow-up for ICD implantation for primary prevention in the setting of a nonischemic cardiomyopathy.      Post procedure time course was complicated by SCAF for which he was transiently on anticoagulation but also Operative repair of reverse left shoulder dislocation  The patient denies chest pain, shortness of breath, nocturnal dyspnea, orthopnea or peripheral edema.  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, has had a UTI and a intercurrent cold.Marland Kitchen    DATE TEST EF    2017 MUGA  28 %    11//17 LHC   % RCA nondominant 90 O/w nonobstructive  12/18 Echo  50-55%    12/21 Echo  30%    2/22 cMRI 20% RV 20%  LGE neg  2/23 Echo  20-25%    2/24 Echo  20-25% RV function normal   9/24 cMRI 21%        Date Cr K Hgb Dig  11.23     13.9    2/24 1.08 4.2       8/24 1.12 4.3   0.5  1/25 1.14 4.9 12.8         Records and Results Reviewed   Past Medical History:  Diagnosis Date   Allergy    Anemia    low iron   Anxiety    Cancer (HCC)    Renal cell cancer   CHF (congestive heart failure) (HCC)    Complex tear of lateral meniscus of right knee as current injury 03/20/2017   Complex tear of medial meniscus of right knee 03/20/2017   Diabetes mellitus without complication (HCC)    History of gastrointestinal hemorrhage    Hypertension    Hypertriglyceridemia    Hypothyroidism    Macular degeneration    lasar surgery- Dr [name]   Neuropathy    Nonischemic cardiomyopathy (HCC)    EF 25-30% by echo 09/12/16   Plantar warts    PONV (postoperative nausea and vomiting)    " a little bit of nausea"   Primary localized osteoarthritis of right knee 03/20/2017   Renal cell carcinoma (HCC)    s/p nephrectomy  02/07/16 at [facility])    Past Surgical History:  Procedure Laterality Date   CARDIAC CATHETERIZATION N/A 04/19/2016   Procedure: Left Heart Cath and Coronary Angiography;  Surgeon: Dolores Patty, MD;  Location: [facility] INVASIVE CV LAB;  Service: Cardiovascular;  Laterality: N/A;   CHONDROPLASTY Right 03/20/2017   Procedure: CHONDROPLASTY;  Surgeon: Teryl Lucy, MD;  Location: [facility] OR;  Service: Orthopedics;  Laterality: Right;   COLONOSCOPY  03/22/2005   EYE SURGERY Right    laser surgery, right eye macular degeneration   ICD IMPLANT N/A 05/05/2023   Procedure: ICD IMPLANT;  Surgeon: Duke Salvia, MD;  Location: [facility] INVASIVE CV LAB;  Service: Cardiovascular;  Laterality: N/A;   KNEE ARTHROSCOPY Right    x 2   KNEE ARTHROSCOPY WITH MEDIAL MENISECTOMY Right 03/20/2017   Procedure: KNEE ARTHROSCOPY WITH MEDIAL AND LATERAL MENISECTOMY;  Surgeon: Teryl Lucy, MD;  Location: [facility] OR;  Service: Orthopedics;  Laterality: Right;   NEPHRECTOMY Right    orif fx fibula     REVERSE SHOULDER ARTHROPLASTY Left 06/20/2023   Procedure:  REVERSE SHOULDER ARTHROPLASTY REVISION;  Surgeon: [name], MD;  Location: [facility] ORS;  Service: Orthopedics;  Laterality: Left;   sebaceous cyst excision  1985   TONSILLECTOMY     TOTAL SHOULDER ARTHROPLASTY Left 11/11/2014   Procedure: LEFT TOTAL SHOULDER ARTHROPLASTY;  Surgeon: [name], MD;  Location: [facility] OR;  Service: Orthopedics;  Laterality: Left;   TOTAL SHOULDER REVISION Left 06/06/2023   Procedure: SHOULDER ARTHROPLASTY REVISION;again FEB 2025   Surgeon: [name], MD;  Location: [facility] ORS;  Service: Orthopedics;  Laterality: Left;  with interscalene block   VASECTOMY      Current Outpatient Medications  Medication Sig Dispense Refill   Acetaminophen (TYLENOL PO) Take 400 mg by mouth 2 (two) times daily.     atorvastatin (LIPITOR) 80 MG tablet Take 1 tablet (80 mg total) by mouth daily. 90 tablet 1   azithromycin (ZITHROMAX) 250 MG tablet Take 2  tablets on day 1, then 1 tablet daily on days 2 through 5 6 tablet 0   carvedilol (COREG) 25 MG tablet TAKE 1 TABLET(25 MG) BY MOUTH TWICE DAILY WITH A MEAL 180 tablet 3   cetirizine (ZYRTEC) 10 MG tablet Take 10 mg by mouth daily.     Cholecalciferol 50 MCG (2000 UT) TABS Take 2 tablets (4,000 Units total) by mouth daily. (Patient taking differently: Take 4,000 Units by mouth 2 (two) times daily.) 180 tablet 1   digoxin (LANOXIN) 0.125 MG tablet TAKE 1 TABLET(125 MCG) BY MOUTH DAILY 90 tablet 3   ENTRESTO 97-103 MG TAKE 1 TABLET BY MOUTH TWICE DAILY 180 tablet 3   FIASP FLEXTOUCH 100 UNIT/ML FlexTouch Pen Inject 24-40 Units into the skin in the morning, at noon, and at bedtime.     fluticasone (CUTIVATE) 0.05 % cream Apply 1 Application topically daily as needed (irritation).     furosemide (LASIX) 40 MG tablet TAKE 1 TABLET BY MOUTH THREE TIMES A WEEK, ON MONDAY, WEDNESDAY, AND FRIDAY. MAY TAKE ADDITIONAL TABLETS AS NEEDED. 180 tablet 0   levothyroxine (SYNTHROID, LEVOTHROID) 25 MCG tablet TAKE 1 TABLET ONCE DAILY BEFORE BREAKFAST 30 tablet 0   metFORMIN (GLUCOPHAGE-XR) 500 MG 24 hr tablet Take 500 mg by mouth daily.     PARoxetine (PAXIL) 30 MG tablet TAKE 1 TABLET(30 MG) BY MOUTH DAILY 90 tablet 0   spironolactone (ALDACTONE) 25 MG tablet TAKE 1/2 TABLET(12.5 MG) BY MOUTH DAILY 45 tablet 3   thiamine (VITAMIN B1) 100 MG tablet TAKE 1 TABLET(100 MG) BY MOUTH EVERY OTHER DAY 45 tablet 1   tirzepatide (MOUNJARO) 15 MG/0.5ML Pen Inject 15 mg into the skin once a week. 2 mL 0   TRESIBA FLEXTOUCH 100 UNIT/ML FlexTouch Pen Inject 10 Units into the skin at bedtime.     No current facility-administered medications for this visit.    No Known Allergies    Social History   Tobacco Use   Smoking status: Never   Smokeless tobacco: Never  Vaping Use   Vaping status: Never Used  Substance Use Topics   Alcohol use: Yes    Alcohol/week: 0.0 standard drinks of alcohol    Comment: social   Drug  use: No     Family History  Problem Relation Age of Onset   Hypertension Mother    Bipolar disorder Mother    Cancer Mother 82       Brain   Other Father        brain tumor   Colon cancer Neg Hx    Colon polyps  Neg Hx    Rectal cancer Neg Hx    Stomach cancer Neg Hx    Early death Neg Hx    Hyperlipidemia Neg Hx    Kidney disease Neg Hx      Current Meds  Medication Sig   Acetaminophen (TYLENOL PO) Take 400 mg by mouth 2 (two) times daily.   atorvastatin (LIPITOR) 80 MG tablet Take 1 tablet (80 mg total) by mouth daily.   azithromycin (ZITHROMAX) 250 MG tablet Take 2 tablets on day 1, then 1 tablet daily on days 2 through 5   carvedilol (COREG) 25 MG tablet TAKE 1 TABLET(25 MG) BY MOUTH TWICE DAILY WITH A MEAL   cetirizine (ZYRTEC) 10 MG tablet Take 10 mg by mouth daily.   Cholecalciferol 50 MCG (2000 UT) TABS Take 2 tablets (4,000 Units total) by mouth daily. (Patient taking differently: Take 4,000 Units by mouth 2 (two) times daily.)   digoxin (LANOXIN) 0.125 MG tablet TAKE 1 TABLET(125 MCG) BY MOUTH DAILY   ENTRESTO 97-103 MG TAKE 1 TABLET BY MOUTH TWICE DAILY   FIASP FLEXTOUCH 100 UNIT/ML FlexTouch Pen Inject 24-40 Units into the skin in the morning, at noon, and at bedtime.   fluticasone (CUTIVATE) 0.05 % cream Apply 1 Application topically daily as needed (irritation).   furosemide (LASIX) 40 MG tablet TAKE 1 TABLET BY MOUTH THREE TIMES A WEEK, ON MONDAY, WEDNESDAY, AND FRIDAY. MAY TAKE ADDITIONAL TABLETS AS NEEDED.   levothyroxine (SYNTHROID, LEVOTHROID) 25 MCG tablet TAKE 1 TABLET ONCE DAILY BEFORE BREAKFAST   metFORMIN (GLUCOPHAGE-XR) 500 MG 24 hr tablet Take 500 mg by mouth daily.   PARoxetine (PAXIL) 30 MG tablet TAKE 1 TABLET(30 MG) BY MOUTH DAILY   spironolactone (ALDACTONE) 25 MG tablet TAKE 1/2 TABLET(12.5 MG) BY MOUTH DAILY   thiamine (VITAMIN B1) 100 MG tablet TAKE 1 TABLET(100 MG) BY MOUTH EVERY OTHER DAY   tirzepatide (MOUNJARO) 15 MG/0.5ML Pen Inject 15 mg  into the skin once a week.   TRESIBA FLEXTOUCH 100 UNIT/ML FlexTouch Pen Inject 10 Units into the skin at bedtime.     Review of Systems negative except from HPI and PMH  Physical Exam BP 118/82   Pulse 76   Ht 6\' 1"  (1.854 m)   Wt 261 lb 3.2 oz (118.5 kg)   SpO2 95%   BMI 34.46 kg/m  Well developed and well nourished in no acute distress HENT normal Neck supple with JVP-flat Clear Device pocket well healed; without hematoma or erythema.  There is no tethering  Regular rate and rhythm, no  gallop No murmur Abd-soft with active BS No Clubbing cyanosis  edema Skin-warm and dry A & Oriented  Grossly normal sensory and motor function  ECG sinus @ 76 19/15/44  Device function isnormal. Programming changes wavelet inactivated  See Paceart for details     Assessment and  Plan Nonischemic cardiomyopathy question mechanism   Congestive heart failure class II   Morbid obesity   S/p renal  cell carcinoma resection and immunotherapy  Defibrillator-[employer]-single-chamber  Patient's device function is normal; however, there are problems with wavelet related to a double peaked R wave where the first peak and the second peak can compete for the low tolerance which causes the device could not be able to discriminate between what is sinus and what is not sinus.  We have inactivated it.  He is left is a single-chamber device with 30 out of 40 detection  Euvolemic.  Continue GDMT with digoxin and carvedilol spironolactone  Will have him get a dig level when he follows up with Dr. Dorthea Cove.

## 2023-08-13 NOTE — Patient Instructions (Signed)
 Medication Instructions:  Your physician recommends that you continue on your current medications as directed. Please refer to the Current Medication list given to you today.  *If you need a refill on your cardiac medications before your next appointment, please call your pharmacy*   Lab Work: None ordered.  If you have labs (blood work) drawn today and your tests are completely normal, you will receive your results only by: MyChart Message (if you have MyChart) OR A paper copy in the mail If you have any lab test that is abnormal or we need to change your treatment, we will call you to review the results.   Testing/Procedures: None ordered.    Follow-Up: At [facility], you and your health needs are our priority.  As part of our continuing mission to provide you with exceptional heart care, we have created designated Provider Care Teams.  These Care Teams include your primary Cardiologist (physician) and Advanced Practice Providers (APPs -  Physician Assistants and Nurse Practitioners) who all work together to provide you with the care you need, when you need it.  We recommend signing up for the patient portal called "MyChart".  Sign up information is provided on this After Visit Summary.  MyChart is used to connect with patients for Virtual Visits (Telemedicine).  Patients are able to view lab/test results, encounter notes, upcoming appointments, etc.  Non-urgent messages can be sent to your provider as well.   To learn more about what you can do with MyChart, go to [email or web address].    Your next appointment:   9 months

## 2023-08-25 LAB — CUP PACEART INCLINIC DEVICE CHECK
Date Time Interrogation Session: 20250319135038
Implantable Lead Connection Status: 753985
Implantable Lead Implant Date: 20241209
Implantable Lead Location: 753860
Implantable Pulse Generator Implant Date: 20241209

## 2023-08-26 ENCOUNTER — Ambulatory Visit (HOSPITAL_COMMUNITY)
Admission: RE | Admit: 2023-08-26 | Discharge: 2023-08-26 | Disposition: A | Discharge status: Home / Self Care | Payer: BC Managed Care – PPO | Source: Ambulatory Visit | Attending: Internal Medicine | Admitting: Internal Medicine

## 2023-08-26 VITALS — BP 116/88 | HR 65 | Wt 265.2 lb

## 2023-08-26 DIAGNOSIS — Z794 Long term (current) use of insulin: Secondary | ICD-10-CM | POA: Insufficient documentation

## 2023-08-26 DIAGNOSIS — Z79899 Other long term (current) drug therapy: Secondary | ICD-10-CM | POA: Diagnosis not present

## 2023-08-26 DIAGNOSIS — E669 Obesity, unspecified: Secondary | ICD-10-CM | POA: Insufficient documentation

## 2023-08-26 DIAGNOSIS — C7800 Secondary malignant neoplasm of unspecified lung: Secondary | ICD-10-CM | POA: Diagnosis not present

## 2023-08-26 DIAGNOSIS — I110 Hypertensive heart disease with heart failure: Secondary | ICD-10-CM | POA: Diagnosis not present

## 2023-08-26 DIAGNOSIS — I420 Dilated cardiomyopathy: Secondary | ICD-10-CM | POA: Insufficient documentation

## 2023-08-26 DIAGNOSIS — E1040 Type 1 diabetes mellitus with diabetic neuropathy, unspecified: Secondary | ICD-10-CM | POA: Insufficient documentation

## 2023-08-26 DIAGNOSIS — I10 Essential (primary) hypertension: Secondary | ICD-10-CM

## 2023-08-26 DIAGNOSIS — Z905 Acquired absence of kidney: Secondary | ICD-10-CM | POA: Diagnosis not present

## 2023-08-26 DIAGNOSIS — I5022 Chronic systolic (congestive) heart failure: Secondary | ICD-10-CM | POA: Diagnosis not present

## 2023-08-26 DIAGNOSIS — Z7984 Long term (current) use of oral hypoglycemic drugs: Secondary | ICD-10-CM | POA: Diagnosis not present

## 2023-08-26 DIAGNOSIS — I2510 Atherosclerotic heart disease of native coronary artery without angina pectoris: Secondary | ICD-10-CM | POA: Diagnosis not present

## 2023-08-26 MED ORDER — FUROSEMIDE 40 MG PO TABS: ORAL_TABLET | ORAL | 3 refills | Status: DC

## 2023-08-26 NOTE — Progress Notes (Signed)
 Advanced [facility] Failure Clinic Note   Date:  08/26/2023   ID:  Gregory [name], DOB 1960-08-22, MRN [id number]  Location: Home  Provider location: [place] [facility] Type of Visit: Established patient  PCP:  [name], MD  Cardiologist:  None Primary HF: Gregory [name]  Chief Complaint: Heart Failure follow-up   History of Present Illness:  Gregory [name] is a 63 y/o male with h/o obesity, metastatic R renal cell CA s/p R nephrectomy in 9/17 with the subsequent discovery of 2 pulmonary nodules whom we follow for non-ischemic cardiomyopathy.   Developed microscopic hematuria and found to have large right renal mass c/w renal cell CA. Eventually underwent right radical nephrectomy with adrenal sparing on 02/07/16 at [facility]. Post-op developed bradycardia with HRs in 30s while sleeping. BP stable. When awakened had dizziness so given atropine. ECG revealed sinus brady in 40s with mild QT prolongation which resolved. Echo done and EF 45% with global HK. RV dilated  With normal function .   We saw him for the first time in October 2017. At that time we ordered cMRI and stress test. However in the interim he went back to [facility]. Found to have 2 metastatic lung nodules Echo showed EF at 46%. However MUGA with EF 28% so referred back to [place] for further evaluation prior to chemo. Underwent cath 04/09/16 as below. Had chemo and lung nodule resection.    1. 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 Otherwise normal coronaries 3. Nonischemic, dilated cardiomyopathy with EF 30-35% and global hypokinesis   EF 12/21 EF 30% RV normal  Echo 12/13/19 EF 20-25%. RV normal  cMRI 05/29/16 EF 28% No LGE cMRI 2/22 EF 20% RV 20% No LGE    Had sleep study which was negative.    Echo 07/29/19 EF 10-15% Mild RV dysfunction. Underwent aggressive med titration with help of PharmDs  Echo 07/10/22: EF 20-25%. RV ok   cMRI  9/24 1.  Moderately dilated LV with diffuse hypokinesis, EF 21%. 2.  Normal RV szie with EF 32%. 3. No myocardial LGE, so no definitive evidence for prior MI, infiltrative disease, or myocarditis. 4. Mildly increased extracellular volume percentage, not in cardiac amyloidosis range.  Underwent MDT ICD 12/24  Here for f/u. Doing well. Working [employer]. Mild dyspnea and fatigue at times. No CP, edema, orthopnea or PND.    Past Medical History:  Diagnosis Date   Allergy    Anemia    low iron   Anxiety    Cancer (HCC)    Renal cell cancer   CHF (congestive heart failure) (HCC)    Complex tear of lateral meniscus of right knee as current injury 03/20/2017   Complex tear of medial meniscus of right knee 03/20/2017   Diabetes mellitus without complication (HCC)    History of gastrointestinal hemorrhage    Hypertension    Hypertriglyceridemia    Hypothyroidism    Macular degeneration    lasar surgery- Dr [name]   Neuropathy    Nonischemic cardiomyopathy (HCC)    EF 25-30% by echo 09/12/16   Plantar warts    PONV (postoperative nausea and vomiting)    " a little bit of nausea"   Primary localized osteoarthritis of right knee 03/20/2017   Renal cell carcinoma (HCC)    s/p nephrectomy 02/07/16 at [facility])   Past Surgical History:  Procedure Laterality Date   CARDIAC CATHETERIZATION  N/A 04/19/2016   Procedure: Left Heart Cath and Coronary Angiography;  Surgeon: Dolores Patty, MD;  Location: [facility] INVASIVE CV LAB;  Service: Cardiovascular;  Laterality: N/A;   CHONDROPLASTY Right 03/20/2017   Procedure: CHONDROPLASTY;  Surgeon: Teryl Lucy, MD;  Location: [facility] OR;  Service: Orthopedics;  Laterality: Right;   COLONOSCOPY  03/22/2005   EYE SURGERY Right    laser surgery, right eye macular degeneration   ICD IMPLANT N/A 05/05/2023   Procedure: ICD IMPLANT;  Surgeon: Duke Salvia, MD;  Location: [facility] INVASIVE CV LAB;  Service: Cardiovascular;  Laterality: N/A;   KNEE ARTHROSCOPY  Right    x 2   KNEE ARTHROSCOPY WITH MEDIAL MENISECTOMY Right 03/20/2017   Procedure: KNEE ARTHROSCOPY WITH MEDIAL AND LATERAL MENISECTOMY;  Surgeon: Teryl Lucy, MD;  Location: [facility] OR;  Service: Orthopedics;  Laterality: Right;   NEPHRECTOMY Right    orif fx fibula     REVERSE SHOULDER ARTHROPLASTY Left 06/20/2023   Procedure: REVERSE SHOULDER ARTHROPLASTY REVISION;  Surgeon: [name], MD;  Location: [facility] ORS;  Service: Orthopedics;  Laterality: Left;   sebaceous cyst excision  1985   TONSILLECTOMY     TOTAL SHOULDER ARTHROPLASTY Left 11/11/2014   Procedure: LEFT TOTAL SHOULDER ARTHROPLASTY;  Surgeon: [name], MD;  Location: [facility] OR;  Service: Orthopedics;  Laterality: Left;   TOTAL SHOULDER REVISION Left 06/06/2023   Procedure: SHOULDER ARTHROPLASTY REVISION;again FEB 2025   Surgeon: [name], MD;  Location: [facility] ORS;  Service: Orthopedics;  Laterality: Left;  with interscalene block   VASECTOMY       Current Outpatient Medications  Medication Sig Dispense Refill   Acetaminophen (TYLENOL PO) Take 400 mg by mouth 2 (two) times daily.     atorvastatin (LIPITOR) 80 MG tablet Take 1 tablet (80 mg total) by mouth daily. 90 tablet 1   carvedilol (COREG) 25 MG tablet TAKE 1 TABLET(25 MG) BY MOUTH TWICE DAILY WITH A MEAL 180 tablet 3   cetirizine (ZYRTEC) 10 MG tablet Take 10 mg by mouth daily.     Cholecalciferol 50 MCG (2000 UT) TABS Take 2 tablets (4,000 Units total) by mouth daily. (Patient taking differently: Take 4,000 Units by mouth 2 (two) times daily.) 180 tablet 1   digoxin (LANOXIN) 0.125 MG tablet TAKE 1 TABLET(125 MCG) BY MOUTH DAILY 90 tablet 3   ENTRESTO 97-103 MG TAKE 1 TABLET BY MOUTH TWICE DAILY 180 tablet 3   FIASP FLEXTOUCH 100 UNIT/ML FlexTouch Pen Inject 24-40 Units into the skin in the morning, at noon, and at bedtime.     fluticasone (CUTIVATE) 0.05 % cream Apply 1 Application topically daily as needed (irritation).     furosemide (LASIX) 40 MG tablet TAKE 1  TABLET BY MOUTH THREE TIMES A WEEK, ON MONDAY, WEDNESDAY, AND FRIDAY. MAY TAKE ADDITIONAL TABLETS AS NEEDED. 180 tablet 0   levothyroxine (SYNTHROID, LEVOTHROID) 25 MCG tablet TAKE 1 TABLET ONCE DAILY BEFORE BREAKFAST 30 tablet 0   metFORMIN (GLUCOPHAGE-XR) 500 MG 24 hr tablet Take 500 mg by mouth daily.     PARoxetine (PAXIL) 30 MG tablet TAKE 1 TABLET(30 MG) BY MOUTH DAILY 90 tablet 0   spironolactone (ALDACTONE) 25 MG tablet TAKE 1/2 TABLET(12.5 MG) BY MOUTH DAILY 45 tablet 3   thiamine (VITAMIN B1) 100 MG tablet TAKE 1 TABLET(100 MG) BY MOUTH EVERY OTHER DAY 45 tablet 1   tirzepatide (MOUNJARO) 15 MG/0.5ML Pen Inject 15 mg into the skin once a week. (Patient taking differently: Inject 15 mg into the  skin once a week. Takes on Tuesdays) 2 mL 0   TRESIBA FLEXTOUCH 100 UNIT/ML FlexTouch Pen Inject 14 Units into the skin at bedtime.     No current facility-administered medications for this encounter.    Allergies:   Patient has no known allergies.   Social History:  The patient  reports that he has never smoked. He has never used smokeless tobacco. He reports current alcohol use. He reports that he does not use drugs.   Family History:  The patient's family history includes Bipolar disorder in his mother; Cancer (age of onset: 66) in his mother; Hypertension in his mother; Other in his father.   ROS:  Please see the history of present illness.   All other systems are personally reviewed and negative.   Vitals:   08/26/23 1452  BP: 116/88  Pulse: 65  SpO2: 98%  Weight: 120.3 kg (265 lb 3.2 oz)    Wt Readings from Last 3 Encounters:  08/26/23 120.3 kg (265 lb 3.2 oz)  08/13/23 118.5 kg (261 lb 3.2 oz)  08/11/23 120.7 kg (266 lb)   Body mass index is 34.99 kg/m.  Exam:   General:  Well appearing. No resp difficulty HEENT: normal Neck: supple. no JVD. Carotids 2+ bilat; no bruits. No lymphadenopathy or thryomegaly appreciated. Cor: PMI nondisplaced. Regular rate & rhythm. No rubs,  gallops or murmurs. Lungs: clear Abdomen: obese soft, nontender, nondistended. No bruits or masses. Good bowel sounds. Extremities: no cyanosis, clubbing, rash, edema Neuro: alert & orientedx3, cranial nerves grossly intact. moves all 4 extremities w/o difficulty. Affect pleasant   Recent Labs: 01/10/2023: ALT 9 05/30/2023: Platelets 204 06/03/2023: Pro B Natriuretic peptide (BNP) 414.0; TSH 1.94 06/07/2023: BUN 24; Creatinine, Ser 1.14; Hemoglobin 12.8; Potassium 4.9; Sodium 136  Personally reviewed   Wt Readings from Last 3 Encounters:  08/26/23 120.3 kg (265 lb 3.2 oz)  08/13/23 118.5 kg (261 lb 3.2 oz)  08/11/23 120.7 kg (266 lb)      ASSESSMENT AND PLAN:  1. Chronic systolic HF due to NICM   - Unclear etiology. EF 30-35% by cath with NICM. cMRI 1/18 with EF 28%.  -  Etiology remains unclear - possible due to immuno-therapy. No evidence of scar of infiltrative process on MRI. Echo 4/18 EF 35-40%. Echo 04/2017 EF 50-55% - Echo 07/29/19 EF 10-15% Mild RV - Echo 12/13/19 EF 20-25%. RV normal.  - Echo 12/21 EF 30% Personally reviewed - cMRI 2/22 EF 20% RV 20% No LGE - Echo  04/18/21 EF 25-30% RV ok   - Echo  07/10/22: EF 20-25%. RV ok  - cMRI 9/24 EF 21% RV EF 32% No LGE - s/p MDT 12/24 - Stable NYHA II - Volume ok - Continue carvedilol 25 bid - Continue Entresto 97/103 mg bid.  - Continue spiro 12.5 will not increase with previous hyperkalemia - No SGLT2i with Type I DM - Genetic evaluation was negative - Recent bloodwork reviewed and ok [place] 1.14    2. Metastatic Renal Cell CA with lung nodules - s/p nephrectomy.  - [facility]. Felt to be in readmission > 8 years    3. HTN:  -  Blood pressure well controlled. Continue current regimen.  4. CAD - 1v in small RCA.  - No s/s angina - Continue low-dose ASA and statin - Continue atorva 80   5. 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 - Followed by Dr. Sharl Ma  6. Obesity - continue  Peter Congo, MD  08/26/2023 2:59 PM  Advanced [facility] Failure Clinic [facility] [address] Heart and Vascular [place] [address] [phone] (office) [phone] (fax)

## 2023-08-26 NOTE — Addendum Note (Signed)
 Encounter addended by: Noralee Space, RN on: 08/26/2023 3:47 PM  Actions taken: Order list changed, Diagnosis association updated

## 2023-08-26 NOTE — Patient Instructions (Signed)
Your physician recommends that you schedule a follow-up appointment in: 6 months with an echocardiogram  

## 2023-08-26 NOTE — Addendum Note (Signed)
 Encounter addended by: Noralee Space, RN on: 08/26/2023 3:40 PM  Actions taken: Visit diagnoses modified, Order list changed, Diagnosis association updated, Clinical Note Signed

## 2023-09-02 DIAGNOSIS — Z4789 Encounter for other orthopedic aftercare: Secondary | ICD-10-CM | POA: Diagnosis not present

## 2023-09-08 ENCOUNTER — Encounter: Payer: Self-pay | Admitting: Internal Medicine

## 2023-09-15 DIAGNOSIS — E349 Endocrine disorder, unspecified: Secondary | ICD-10-CM | POA: Diagnosis not present

## 2023-09-15 DIAGNOSIS — E039 Hypothyroidism, unspecified: Secondary | ICD-10-CM | POA: Diagnosis not present

## 2023-09-15 DIAGNOSIS — Z794 Long term (current) use of insulin: Secondary | ICD-10-CM | POA: Diagnosis not present

## 2023-09-15 DIAGNOSIS — E1165 Type 2 diabetes mellitus with hyperglycemia: Secondary | ICD-10-CM | POA: Diagnosis not present

## 2023-09-16 NOTE — Telephone Encounter (Signed)
 Alert received for 1 AF episode on 09/12/23 at 20:33 of ongoing duration.    Outreach made to Pt requesting remote transmission to determine length of episode.  Transmission received.  AF episode lasted 12 hours 56 minutes.  Appears total amount of atrial fibrillation over last 30 days is 15 hours.

## 2023-09-18 NOTE — Telephone Encounter (Signed)
 Gregory [name]   I know you know this guy  would you be willing to discuss with him anticoagulation in setting of CHADSVAS of 1 ( LV dysfunction) and SCAF 13hrs-- I am inclined towards anticoagulation but it is not totally clear but the pt needs to be part of "share decision making" Thanks SK

## 2023-09-22 NOTE — Progress Notes (Signed)
 Remote ICD transmission.

## 2023-09-26 ENCOUNTER — Telehealth: Payer: Self-pay | Admitting: Internal Medicine

## 2023-09-26 NOTE — Telephone Encounter (Unsigned)
 Copied from CRM [id number]. Topic: Clinical - Medication Question >> Sep 26, 2023 10:23 AM Ovid Blow wrote: Reason for CRM: Patient tested positive for Covid this morning and is requesting some medication for symptoms. Please contact patient regarding this issue

## 2023-09-29 NOTE — Telephone Encounter (Signed)
 Patient states that he's feeling better and doesn't think he needs any medication.

## 2023-10-07 ENCOUNTER — Encounter: Payer: Self-pay | Admitting: Internal Medicine

## 2023-10-07 ENCOUNTER — Ambulatory Visit: Attending: Internal Medicine | Admitting: Internal Medicine

## 2023-10-07 VITALS — BP 112/60 | HR 77 | Ht 74.0 in | Wt 256.4 lb

## 2023-10-07 DIAGNOSIS — E785 Hyperlipidemia, unspecified: Secondary | ICD-10-CM | POA: Diagnosis not present

## 2023-10-07 DIAGNOSIS — Z79899 Other long term (current) drug therapy: Secondary | ICD-10-CM | POA: Diagnosis not present

## 2023-10-07 LAB — CUP PACEART INCLINIC DEVICE CHECK
Date Time Interrogation Session: 20250513124140
Implantable Lead Connection Status: 753985
Implantable Lead Implant Date: 20241209
Implantable Lead Location: 753860
Implantable Pulse Generator Implant Date: 20241209

## 2023-10-07 MED ORDER — APIXABAN 5 MG PO TABS: 5.0000 mg | ORAL_TABLET | Freq: Two times a day (BID) | ORAL | 3 refills | Status: AC

## 2023-10-07 NOTE — Progress Notes (Signed)
 HPI Gregory [name] returns today for followup. He is a pleasant 63 yo man with a h/o non-ischemic CM, remote renal cell CA, obesity who is s/p ICD insertion. He has been found to have PAF on his ICD interrogation. He returns today for followup in Dr. Doyle Generous absence. He does not have palpitations or any awareness of his atrial fib.  No Known Allergies   Current Outpatient Medications  Medication Sig Dispense Refill   Acetaminophen  (TYLENOL  PO) Take 400 mg by mouth 2 (two) times daily.     atorvastatin  (LIPITOR) 80 MG tablet Take 1 tablet (80 mg total) by mouth daily. 90 tablet 1   carvedilol  (COREG ) 25 MG tablet TAKE 1 TABLET(25 MG) BY MOUTH TWICE DAILY WITH A MEAL 180 tablet 3   cetirizine (ZYRTEC) 10 MG tablet Take 10 mg by mouth daily.     Cholecalciferol  50 MCG (2000 UT) TABS Take 2 tablets (4,000 Units total) by mouth daily. (Patient taking differently: Take 4,000 Units by mouth 2 (two) times daily.) 180 tablet 1   digoxin  (LANOXIN ) 0.125 MG tablet TAKE 1 TABLET(125 MCG) BY MOUTH DAILY 90 tablet 3   ENTRESTO  97-103 MG TAKE 1 TABLET BY MOUTH TWICE DAILY 180 tablet 3   FIASP  FLEXTOUCH 100 UNIT/ML FlexTouch Pen Inject 24-40 Units into the skin in the morning, at noon, and at bedtime.     fluticasone  (CUTIVATE ) 0.05 % cream Apply 1 Application topically daily as needed (irritation).     furosemide  (LASIX ) 40 MG tablet TAKE 1 TABLET BY MOUTH THREE TIMES A WEEK, ON MONDAY, WEDNESDAY, AND FRIDAY. MAY TAKE ADDITIONAL TABLETS AS NEEDED. 180 tablet 3   levothyroxine  (SYNTHROID , LEVOTHROID) 25 MCG tablet TAKE 1 TABLET ONCE DAILY BEFORE BREAKFAST 30 tablet 0   metFORMIN  (GLUCOPHAGE -XR) 500 MG 24 hr tablet Take 500 mg by mouth daily.     PARoxetine  (PAXIL ) 30 MG tablet TAKE 1 TABLET(30 MG) BY MOUTH DAILY 90 tablet 0   spironolactone  (ALDACTONE ) 25 MG tablet TAKE 1/2 TABLET(12.5 MG) BY MOUTH DAILY 45 tablet 3   thiamine  (VITAMIN B1) 100 MG tablet TAKE 1 TABLET(100 MG) BY MOUTH EVERY OTHER DAY 45  tablet 1   tirzepatide  (MOUNJARO ) 15 MG/0.5ML Pen Inject 15 mg into the skin once a week. (Patient taking differently: Inject 15 mg into the skin once a week. Takes on Tuesdays) 2 mL 0   TRESIBA FLEXTOUCH 100 UNIT/ML FlexTouch Pen Inject 14 Units into the skin at bedtime.     No current facility-administered medications for this visit.     Past Medical History:  Diagnosis Date   Allergy    Anemia    low iron   Anxiety    Cancer (HCC)    Renal cell cancer   CHF (congestive heart failure) (HCC)    Complex tear of lateral meniscus of right knee as current injury 03/20/2017   Complex tear of medial meniscus of right knee 03/20/2017   Diabetes mellitus without complication (HCC)    History of gastrointestinal hemorrhage    Hypertension    Hypertriglyceridemia    Hypothyroidism    Macular degeneration    lasar surgery- Dr Augustus Ledger   Neuropathy    Nonischemic cardiomyopathy (HCC)    EF 25-30% by echo 09/12/16   Plantar warts    PONV (postoperative nausea and vomiting)    " a little bit of nausea"   Primary localized osteoarthritis of right knee 03/20/2017   Renal cell carcinoma (HCC)    s/p  nephrectomy [facility]/07/16 at [facility])    ROS:   All systems reviewed and negative except as noted in the HPI.   Past Surgical History:  Procedure Laterality Date   CARDIAC CATHETERIZATION N/A 04/19/2016   Procedure: Left Heart Cath and Coronary Angiography;  Surgeon: Mardell Shade, MD;  Location: [facility] INVASIVE CV LAB;  Service: Cardiovascular;  Laterality: N/A;   CHONDROPLASTY Right 03/20/2017   Procedure: CHONDROPLASTY;  Surgeon: Osa Blase, MD;  Location: [facility] OR;  Service: Orthopedics;  Laterality: Right;   COLONOSCOPY  03/22/2005   EYE SURGERY Right    laser surgery, right eye macular degeneration   ICD IMPLANT N/A 05/05/2023   Procedure: ICD IMPLANT;  Surgeon: Verona Goodwill, MD;  Location: [facility] INVASIVE CV LAB;  Service: Cardiovascular;  Laterality: N/A;   KNEE ARTHROSCOPY  Right    x 2   KNEE ARTHROSCOPY WITH MEDIAL MENISECTOMY Right 03/20/2017   Procedure: KNEE ARTHROSCOPY WITH MEDIAL AND LATERAL MENISECTOMY;  Surgeon: Osa Blase, MD;  Location: [facility] OR;  Service: Orthopedics;  Laterality: Right;   NEPHRECTOMY Right    orif fx fibula     REVERSE SHOULDER ARTHROPLASTY Left 06/20/2023   Procedure: REVERSE SHOULDER ARTHROPLASTY REVISION;  Surgeon: Winston Hawking, MD;  Location: [facility] ORS;  Service: Orthopedics;  Laterality: Left;   sebaceous cyst excision  1985   TONSILLECTOMY     TOTAL SHOULDER ARTHROPLASTY Left 11/11/2014   Procedure: LEFT TOTAL SHOULDER ARTHROPLASTY;  Surgeon: Winston Hawking, MD;  Location: [facility] OR;  Service: Orthopedics;  Laterality: Left;   TOTAL SHOULDER REVISION Left 06/06/2023   Procedure: SHOULDER ARTHROPLASTY REVISION;again FEB 2025   Surgeon: Winston Hawking, MD;  Location: [facility] ORS;  Service: Orthopedics;  Laterality: Left;  with interscalene block   VASECTOMY       Family History  Problem Relation Age of Onset   Hypertension Mother    Bipolar disorder Mother    Cancer Mother 65       Brain   Other Father        brain tumor   Colon cancer Neg Hx    Colon polyps Neg Hx    Rectal cancer Neg Hx    Stomach cancer Neg Hx    Early death Neg Hx    Hyperlipidemia Neg Hx    Kidney disease Neg Hx      Social History   Socioeconomic History   Marital status: Married    Spouse name: Not on file   Number of children: Not on file   Years of education: Not on file   Highest education level: Bachelor's degree (e.g., BA, AB, BS)  Occupational History   Not on file  Tobacco Use   Smoking status: Never   Smokeless tobacco: Never  Vaping Use   Vaping status: Never Used  Substance and Sexual Activity   Alcohol use: Yes    Alcohol/week: 0.0 standard drinks of alcohol    Comment: social   Drug use: No   Sexual activity: Not on file  Other Topics Concern   Not on file  Social History Narrative   Married ' 90   2 sons - '96 '02; 1  daughter  '98   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    Social Drivers of Corporate investment banker Strain: Low Risk  (06/02/2023)   Overall Financial Resource Strain (CARDIA)    Difficulty of Paying Living Expenses: Not hard at  all  Food Insecurity: No Food Insecurity (06/24/2023)   Hunger Vital Sign    Worried About Running Out of Food in the Last Year: Never true    Ran Out of Food in the Last Year: Never true  Transportation Needs: No Transportation Needs (06/24/2023)   PRAPARE - Administrator, Civil Service (Medical): No    Lack of Transportation (Non-Medical): No  Physical Activity: Inactive (06/02/2023)   Exercise Vital Sign    Days of Exercise per Week: 0 days    Minutes of Exercise per Session: 20 min  Stress: No Stress Concern Present (06/02/2023)   [employer] of Occupational Health - Occupational Stress Questionnaire    Feeling of Stress : Only a little  Social Connections: Moderately Isolated (06/20/2023)   Social Connection and Isolation Panel [NHANES]    Frequency of Communication with Friends and Family: Three times a week    Frequency of Social Gatherings with Friends and Family: Once a week    Attends Religious Services: Never    Database administrator or Organizations: No    Attends Banker Meetings: Never    Marital Status: Married  Catering manager Violence: Not At Risk (06/24/2023)   Humiliation, Afraid, Rape, and Kick questionnaire    Fear of Current or Ex-Partner: No    Emotionally Abused: No    Physically Abused: No    Sexually Abused: No     BP 112/60   Pulse 77   Ht 6\' 2"  (1.88 m)   Wt 256 lb 6.4 oz (116.3 kg)   SpO2 97%   BMI 32.92 kg/m   Physical Exam:  Well appearing overweight middle aged man, NAD HEENT: Unremarkable Neck:  No JVD, no thyromegally Lymphatics:  No adenopathy Back:  No CVA tenderness Lungs:  Clear with no  wheezes HEART:  Regular rate rhythm, no murmurs, no rubs, no clicks Abd:  soft, positive bowel sounds, no organomegally, no rebound, no guarding Ext:  2 plus pulses, no edema, no cyanosis, no clubbing Skin:  No rashes no nodules Neuro:  CN II through XII intact, motor grossly intact  DEVICE  Normal device function.  See PaceArt for details.   Assess/Plan: PAF - we discussed the stroke risk (CHF, HTN, vascular disease and soon to be 64) and low risk of bleeding and with up to 12 hours of afib at a time, we will start eliquis  5 mg twice daily. If he develops symptoms would consider medical therapy and/or ablation. Chronic systolic heart failure - He has class 2A symptoms. He will continue gdmt.   Gregory Brand Seini Lannom,MD

## 2023-10-07 NOTE — Patient Instructions (Signed)
 Medication Instructions:  Your physician has recommended you make the following change in your medication:  Start Eliquis  5mg  twice daily  Lab Work: CBC and Bmet - in 1 month  You may go to any [facility] Location for your lab work:  [facility] - [address] ([facility]) - [address] [phone] [address]  [place] - [address]   [place]  - [address]   [place] - [address] - [address] [employer]  - [address] - [address] ([facility]   If you have labs (blood work) drawn today and your tests are completely normal, you will receive your results only by: [employer] (if you have MyChart)  If you have any lab test that is abnormal or we need to change your treatment, we will call you or send a MyChart message to review the results.  Testing/Procedures: None ordered.  Follow-Up: At [facility], you and your health needs are our priority.  As part of our continuing mission to provide you with exceptional heart care, we have created designated Provider Care Teams.  These Care Teams include your primary Cardiologist (physician) and Advanced Practice Providers (APPs -  Physician Assistants and Nurse Practitioners) who all work together to provide you with the care you need, when you need it.   Your next appointment:   1 year(s)  The format for your next appointment:   In Person  Provider:   Clinton Danas, MD{or one of the following Advanced Practice Providers on your designated Care Team:   Mertha Abrahams, [place] Bambi Lever "Jonelle Neri" Stark, [place] Neda Balk, NP  Note: Remote monitoring is used to monitor your Pacemaker/ ICD from home. This monitoring reduces the number of office visits required to check your device to one time per year. It allows us  to keep an eye on the functioning of your device to ensure it is working properly.

## 2023-10-21 ENCOUNTER — Other Ambulatory Visit: Payer: Self-pay | Admitting: Internal Medicine

## 2023-10-21 DIAGNOSIS — F418 Other specified anxiety disorders: Secondary | ICD-10-CM

## 2023-10-22 ENCOUNTER — Ambulatory Visit: Payer: Self-pay | Admitting: Cardiology

## 2023-11-05 ENCOUNTER — Ambulatory Visit (INDEPENDENT_AMBULATORY_CARE_PROVIDER_SITE_OTHER): Payer: BC Managed Care – PPO

## 2023-11-05 DIAGNOSIS — I428 Other cardiomyopathies: Secondary | ICD-10-CM

## 2023-11-06 LAB — CUP PACEART REMOTE DEVICE CHECK
Battery Remaining Longevity: 160 mo
Battery Voltage: 3.03 V
Brady Statistic RA Percent Paced: INVALID
Brady Statistic RV Percent Paced: 0 %
Date Time Interrogation Session: 20250610192200
HighPow Impedance: 56 Ohm
Implantable Lead Connection Status: 753985
Implantable Lead Implant Date: 20241209
Implantable Lead Location: 753860
Implantable Pulse Generator Implant Date: 20241209
Lead Channel Impedance Value: 399 Ohm
Lead Channel Impedance Value: 475 Ohm
Lead Channel Pacing Threshold Amplitude: 0.375 V
Lead Channel Pacing Threshold Pulse Width: 0.4 ms
Lead Channel Sensing Intrinsic Amplitude: 10.9 mV
Lead Channel Setting Pacing Amplitude: 2 V
Lead Channel Setting Pacing Pulse Width: 0.4 ms
Lead Channel Setting Sensing Sensitivity: 0.3 mV
Zone Setting Status: 755011

## 2023-11-09 ENCOUNTER — Ambulatory Visit: Payer: Self-pay | Admitting: Cardiology

## 2023-11-10 DIAGNOSIS — Z96612 Presence of left artificial shoulder joint: Secondary | ICD-10-CM | POA: Diagnosis not present

## 2023-11-10 DIAGNOSIS — Z471 Aftercare following joint replacement surgery: Secondary | ICD-10-CM | POA: Diagnosis not present

## 2023-11-20 ENCOUNTER — Other Ambulatory Visit: Payer: Self-pay | Admitting: Internal Medicine

## 2023-11-20 DIAGNOSIS — F418 Other specified anxiety disorders: Secondary | ICD-10-CM

## 2023-11-25 ENCOUNTER — Ambulatory Visit (INDEPENDENT_AMBULATORY_CARE_PROVIDER_SITE_OTHER)

## 2023-12-02 ENCOUNTER — Encounter (INDEPENDENT_AMBULATORY_CARE_PROVIDER_SITE_OTHER): Payer: BC Managed Care – PPO | Admitting: Ophthalmology

## 2023-12-02 DIAGNOSIS — H353121 Nonexudative age-related macular degeneration, left eye, early dry stage: Secondary | ICD-10-CM

## 2023-12-02 DIAGNOSIS — H43813 Vitreous degeneration, bilateral: Secondary | ICD-10-CM

## 2023-12-02 DIAGNOSIS — H353211 Exudative age-related macular degeneration, right eye, with active choroidal neovascularization: Secondary | ICD-10-CM | POA: Diagnosis not present

## 2023-12-02 DIAGNOSIS — H2513 Age-related nuclear cataract, bilateral: Secondary | ICD-10-CM | POA: Diagnosis not present

## 2023-12-17 DIAGNOSIS — E349 Endocrine disorder, unspecified: Secondary | ICD-10-CM | POA: Diagnosis not present

## 2023-12-17 DIAGNOSIS — E1165 Type 2 diabetes mellitus with hyperglycemia: Secondary | ICD-10-CM | POA: Diagnosis not present

## 2023-12-17 DIAGNOSIS — Z794 Long term (current) use of insulin: Secondary | ICD-10-CM | POA: Diagnosis not present

## 2023-12-17 DIAGNOSIS — E039 Hypothyroidism, unspecified: Secondary | ICD-10-CM | POA: Diagnosis not present

## 2023-12-17 LAB — HM DIABETES EYE EXAM

## 2023-12-17 LAB — LIPID PANEL
Cholesterol: 152 (ref 0–200)
HDL: 37 (ref 35–70)
LDL Cholesterol: 68
Triglycerides: 295 — AB (ref 40–160)

## 2023-12-17 LAB — HEMOGLOBIN A1C: Hemoglobin A1C: 8

## 2023-12-20 ENCOUNTER — Other Ambulatory Visit: Payer: Self-pay | Admitting: Internal Medicine

## 2023-12-20 DIAGNOSIS — F418 Other specified anxiety disorders: Secondary | ICD-10-CM

## 2024-01-05 NOTE — Addendum Note (Signed)
 Addended by: VICCI SELLER A on: 01/05/2024 10:58 AM   Modules accepted: Orders

## 2024-01-05 NOTE — Progress Notes (Signed)
 Remote ICD transmission.

## 2024-01-21 ENCOUNTER — Other Ambulatory Visit (HOSPITAL_COMMUNITY): Payer: Self-pay

## 2024-01-21 DIAGNOSIS — I5022 Chronic systolic (congestive) heart failure: Secondary | ICD-10-CM

## 2024-01-21 MED ORDER — ATORVASTATIN CALCIUM 80 MG PO TABS: 80.0000 mg | ORAL_TABLET | Freq: Every day | ORAL | 3 refills | Status: DC

## 2024-02-04 ENCOUNTER — Ambulatory Visit: Payer: BC Managed Care – PPO

## 2024-02-04 DIAGNOSIS — I428 Other cardiomyopathies: Secondary | ICD-10-CM | POA: Diagnosis not present

## 2024-02-04 LAB — CUP PACEART REMOTE DEVICE CHECK
Battery Remaining Longevity: 158 mo
Battery Voltage: 3.02 V
Brady Statistic RV Percent Paced: 0.01 %
Date Time Interrogation Session: 20250909185309
HighPow Impedance: 56 Ohm
Implantable Lead Connection Status: 753985
Implantable Lead Implant Date: 20241209
Implantable Lead Location: 753860
Implantable Pulse Generator Implant Date: 20241209
Lead Channel Impedance Value: 380 Ohm
Lead Channel Impedance Value: 475 Ohm
Lead Channel Pacing Threshold Amplitude: 0.375 V
Lead Channel Pacing Threshold Pulse Width: 0.4 ms
Lead Channel Sensing Intrinsic Amplitude: 11.4 mV
Lead Channel Setting Pacing Amplitude: 2 V
Lead Channel Setting Pacing Pulse Width: 0.4 ms
Lead Channel Setting Sensing Sensitivity: 0.3 mV
Zone Setting Status: 755011

## 2024-02-06 ENCOUNTER — Other Ambulatory Visit: Payer: Self-pay | Admitting: Internal Medicine

## 2024-02-06 DIAGNOSIS — F418 Other specified anxiety disorders: Secondary | ICD-10-CM

## 2024-02-06 NOTE — Telephone Encounter (Signed)
 Copied from CRM #[id number]. Topic: Clinical - Medication Refill >> Feb 06, 2024  1:21 PM Sasha M wrote: Medication: PARoxetine  (PAXIL ) 30 MG tablet  Has the patient contacted their pharmacy? Yes (Agent: If no, request that the patient contact the pharmacy for the refill. If patient does not wish to contact the pharmacy document the reason why and proceed with request.) (Agent: If yes, when and what did the pharmacy advise?) No refills  This is the patient's preferred pharmacy:  [id number] - [place], [place] - [address] AT [facility] [address] [place]  [address] Phone: [phone] Fax: [phone]  Is this the correct pharmacy for this prescription? Yes If no, delete pharmacy and type the correct one.   Has the prescription been filled recently? No  Is the patient out of the medication? Yes  Has the patient been seen for an appointment in the last year OR does the patient have an upcoming appointment? Yes  Can we respond through MyChart? Yes  Agent: Please be advised that Rx refills may take up to 3 business days. We ask that you follow-up with your pharmacy.

## 2024-02-13 NOTE — Progress Notes (Signed)
Remote ICD Transmission.

## 2024-02-15 ENCOUNTER — Ambulatory Visit: Payer: Self-pay | Admitting: Cardiology

## 2024-02-17 ENCOUNTER — Ambulatory Visit: Payer: Self-pay

## 2024-02-17 NOTE — Telephone Encounter (Signed)
 FYI Only or Action Required?: Action required by provider: medication refill request.  Patient was last seen in primary care on 08/11/2023 by Merlynn Niki FALCON, FNP.  Called Nurse Triage reporting Medication Refill.  Symptoms began several days ago.  Interventions attempted: Nothing.  Symptoms are: anxiety/stress, headaches (moderate) gradually worsening.  Triage Disposition: Call PCP When Office is Open  Patient/caregiver understands and will follow disposition?: Yes                  Copied from CRM #[id number]. Topic: Clinical - Red Word Triage >> Feb 17, 2024  9:45 AM Frederich PARAS wrote: [facility] that prompted transfer to Nurse Triage: out of medication/ headaches Pt is out of his paroxetine , he has did a fill and have not heard back he has been without the medication for 10days, pt says he is withdrawing, he needs the medicine , he says he is experiencing bad headaches. Reason for Disposition  [1] Prescription refill request for NON-ESSENTIAL medicine (i.e., no harm to patient if med not taken) AND [2] triager unable to refill per department policy  Answer Assessment - Initial Assessment Questions 1. DRUG NAME: What medicine do you need to have refilled?     Paroxetine .  2. REFILLS REMAINING: How many refills are remaining? Notes: The label on the medicine or pill bottle will show how many refills are remaining. If there are no refills remaining, then a renewal may be needed.     0  3. EXPIRATION DATE: What is the expiration date? Note: The label states when the prescription will expire, and thus can no longer be refilled.)     N/A.  4. PRESCRIBER: Who prescribed it? Note: The prescribing doctor or group is responsible for refill approvals..     Dr Joshua.  5. PHARMACY: Have you contacted your pharmacy (drugstore)? Note: Some pharmacies will contact the doctor (or NP/PA).      Yes.  6. SYMPTOMS: Do you have any symptoms?     He states he is going through  withdrawal from being off Paroxetine  for 10 days. He states he has been having a moderate headache (5-6/10). He states he takes the medication to level him out with stress and anxiety. He states he has been on the medication for over 15 years. Denies nausea, vomiting, diarrhea, chills.  7. PREGNANCY: Is there any chance that you are pregnant? When was your last menstrual period?     N/A.  Protocols used: Medication Refill and Renewal Call-A-AH

## 2024-02-18 ENCOUNTER — Other Ambulatory Visit: Payer: Self-pay | Admitting: Internal Medicine

## 2024-02-18 ENCOUNTER — Ambulatory Visit: Payer: Self-pay

## 2024-02-18 DIAGNOSIS — F418 Other specified anxiety disorders: Secondary | ICD-10-CM

## 2024-02-18 MED ORDER — PAROXETINE HCL 30 MG PO TABS: ORAL_TABLET | ORAL | 0 refills | Status: DC

## 2024-02-18 NOTE — Telephone Encounter (Signed)
 FYI Only or Action Required?: Action required by provider: medication refill request.  Patient was last seen in primary care on 08/11/2023 by Merlynn Niki FALCON, FNP.  Called Nurse Triage reporting Medication Refill.  Symptoms began several days ago.  Interventions attempted: Nothing.  Symptoms are: gradually worsening.  Triage Disposition: Call PCP Now  Patient/caregiver understands and will follow disposition?: Yes      Copied from CRM #[id number]. Topic: Clinical - Red Word Triage >> Feb 18, 2024 10:46 AM Timindy P wrote: [facility] that prompted transfer to Nurse Triage: Increased anxiety and headaches   ----------------------------------------------------------------------- From previous Reason for Contact - Prescription Issue: Reason for CRM: Pt has been unsuccessful in getting this medication and it is causing increased Anxiety and headaches. Pt states he is going through withdrawals. Reason for Disposition  [1] Prescription refill request for ESSENTIAL medicine (i.e., likelihood of harm to patient if not taken) AND [2] triager unable to refill per department policy  Answer Assessment - Initial Assessment Questions Patient called and was upset regarding a medication refill. This RN called [facility] and spoke with Erin. Appointment made for patient to schedule a follow up visit to get medications refilled.    1. DRUG NAME: What medicine do you need to have refilled?    PARoxetine  (PAXIL ) 30 MG tablet 2. SYMPTOMS: Do you have any symptoms?     Anxiety and Headaches  This is the patient's preferred pharmacy:  [id number] - [place], [place] - [address] AT [facility] OF [address] [place] [address] Phone: [phone] Fax: [phone]  Protocols used: Medication Refill and Renewal Call-A-AH

## 2024-02-20 NOTE — Telephone Encounter (Signed)
 NOTED

## 2024-02-20 NOTE — Telephone Encounter (Signed)
 Medication was refilled. 02/18/2024

## 2024-03-11 ENCOUNTER — Telehealth (HOSPITAL_COMMUNITY): Payer: Self-pay | Admitting: Cardiology

## 2024-03-11 ENCOUNTER — Other Ambulatory Visit: Payer: Self-pay | Admitting: Internal Medicine

## 2024-03-11 DIAGNOSIS — F418 Other specified anxiety disorders: Secondary | ICD-10-CM

## 2024-03-11 DIAGNOSIS — I5022 Chronic systolic (congestive) heart failure: Secondary | ICD-10-CM

## 2024-03-11 NOTE — Telephone Encounter (Signed)
 Patient called to report incident of labored breathing last night while laying prone. Reports it was very difficult for him to fall asleep and realized in prone position, breathing was different.  Reports no CP, SOB, cough, or Swelling   Does not check weight daily so no weights to report  Reports medication compliance  Reports he also has defibrillator   Please advise

## 2024-03-11 NOTE — Telephone Encounter (Signed)
 Pt aware and voiced understanding

## 2024-03-17 ENCOUNTER — Ambulatory Visit: Payer: Self-pay | Admitting: Internal Medicine

## 2024-03-17 ENCOUNTER — Telehealth: Payer: Self-pay

## 2024-03-17 ENCOUNTER — Ambulatory Visit (INDEPENDENT_AMBULATORY_CARE_PROVIDER_SITE_OTHER): Admitting: Internal Medicine

## 2024-03-17 ENCOUNTER — Encounter: Payer: Self-pay | Admitting: Internal Medicine

## 2024-03-17 VITALS — BP 124/84 | HR 67 | Temp 97.8°F | Resp 16 | Ht 74.0 in | Wt 264.2 lb

## 2024-03-17 DIAGNOSIS — E039 Hypothyroidism, unspecified: Secondary | ICD-10-CM | POA: Diagnosis not present

## 2024-03-17 DIAGNOSIS — D513 Other dietary vitamin B12 deficiency anemia: Secondary | ICD-10-CM

## 2024-03-17 DIAGNOSIS — I10 Essential (primary) hypertension: Secondary | ICD-10-CM | POA: Diagnosis not present

## 2024-03-17 DIAGNOSIS — E519 Thiamine deficiency, unspecified: Secondary | ICD-10-CM

## 2024-03-17 DIAGNOSIS — F418 Other specified anxiety disorders: Secondary | ICD-10-CM

## 2024-03-17 DIAGNOSIS — E118 Type 2 diabetes mellitus with unspecified complications: Secondary | ICD-10-CM | POA: Diagnosis not present

## 2024-03-17 DIAGNOSIS — E1165 Type 2 diabetes mellitus with hyperglycemia: Secondary | ICD-10-CM | POA: Diagnosis not present

## 2024-03-17 DIAGNOSIS — I4891 Unspecified atrial fibrillation: Secondary | ICD-10-CM

## 2024-03-17 DIAGNOSIS — R231 Pallor: Secondary | ICD-10-CM | POA: Diagnosis not present

## 2024-03-17 DIAGNOSIS — I5022 Chronic systolic (congestive) heart failure: Secondary | ICD-10-CM

## 2024-03-17 DIAGNOSIS — E230 Hypopituitarism: Secondary | ICD-10-CM | POA: Insufficient documentation

## 2024-03-17 DIAGNOSIS — I509 Heart failure, unspecified: Secondary | ICD-10-CM | POA: Diagnosis not present

## 2024-03-17 DIAGNOSIS — Z23 Encounter for immunization: Secondary | ICD-10-CM

## 2024-03-17 DIAGNOSIS — N529 Male erectile dysfunction, unspecified: Secondary | ICD-10-CM | POA: Insufficient documentation

## 2024-03-17 DIAGNOSIS — Z794 Long term (current) use of insulin: Secondary | ICD-10-CM | POA: Diagnosis not present

## 2024-03-17 DIAGNOSIS — E349 Endocrine disorder, unspecified: Secondary | ICD-10-CM | POA: Diagnosis not present

## 2024-03-17 DIAGNOSIS — Z862 Personal history of diseases of the blood and blood-forming organs and certain disorders involving the immune mechanism: Secondary | ICD-10-CM | POA: Diagnosis not present

## 2024-03-17 LAB — HEPATIC FUNCTION PANEL
ALT: 27 U/L (ref 0–53)
AST: 22 U/L (ref 0–37)
Albumin: 4.2 g/dL (ref 3.5–5.2)
Alkaline Phosphatase: 91 U/L (ref 39–117)
Bilirubin, Direct: 0.3 mg/dL (ref 0.0–0.3)
Total Bilirubin: 1 mg/dL (ref 0.2–1.2)
Total Protein: 6.8 g/dL (ref 6.0–8.3)

## 2024-03-17 LAB — TROPONIN I (HIGH SENSITIVITY): High Sens Troponin I: 17 ng/L (ref 2–17)

## 2024-03-17 LAB — CBC WITH DIFFERENTIAL/PLATELET
Basophils Absolute: 0.1 K/uL (ref 0.0–0.1)
Basophils Relative: 0.8 % (ref 0.0–3.0)
Eosinophils Absolute: 0.2 K/uL (ref 0.0–0.7)
Eosinophils Relative: 2.3 % (ref 0.0–5.0)
HCT: 43.2 % (ref 39.0–52.0)
Hemoglobin: 14 g/dL (ref 13.0–17.0)
Lymphocytes Relative: 21.9 % (ref 12.0–46.0)
Lymphs Abs: 1.5 K/uL (ref 0.7–4.0)
MCHC: 32.5 g/dL (ref 30.0–36.0)
MCV: 88.2 fl (ref 78.0–100.0)
Monocytes Absolute: 0.5 K/uL (ref 0.1–1.0)
Monocytes Relative: 6.6 % (ref 3.0–12.0)
Neutro Abs: 4.8 K/uL (ref 1.4–7.7)
Neutrophils Relative %: 68.4 % (ref 43.0–77.0)
Platelets: 219 K/uL (ref 150.0–400.0)
RBC: 4.9 Mil/uL (ref 4.22–5.81)
RDW: 13.7 % (ref 11.5–15.5)
WBC: 7 K/uL (ref 4.0–10.5)

## 2024-03-17 LAB — MICROALBUMIN / CREATININE URINE RATIO
Creatinine,U: 221.6 mg/dL
Microalb Creat Ratio: 116.9 mg/g — ABNORMAL HIGH (ref 0.0–30.0)
Microalb, Ur: 25.9 mg/dL — ABNORMAL HIGH (ref 0.0–1.9)

## 2024-03-17 LAB — BASIC METABOLIC PANEL WITH GFR
BUN: 24 mg/dL — ABNORMAL HIGH (ref 6–23)
CO2: 31 meq/L (ref 19–32)
Calcium: 9.2 mg/dL (ref 8.4–10.5)
Chloride: 103 meq/L (ref 96–112)
Creatinine, Ser: 1.42 mg/dL (ref 0.40–1.50)
GFR: 52.23 mL/min — ABNORMAL LOW (ref >60.00)
Glucose, Bld: 172 mg/dL — ABNORMAL HIGH (ref 70–99)
Potassium: 3.9 meq/L (ref 3.5–5.1)
Sodium: 141 meq/L (ref 135–145)

## 2024-03-17 LAB — FOLATE: Folate: 18.7 ng/mL (ref >5.9)

## 2024-03-17 LAB — BRAIN NATRIURETIC PEPTIDE: Pro B Natriuretic peptide (BNP): 1502 pg/mL — ABNORMAL HIGH (ref 0.0–100.0)

## 2024-03-17 LAB — VITAMIN B12: Vitamin B-12: >1500 pg/mL — ABNORMAL HIGH (ref 211–911)

## 2024-03-17 LAB — TSH: TSH: 3.15 u[IU]/mL (ref 0.35–5.50)

## 2024-03-17 MED ORDER — COVID-19 MRNA VAC-TRIS(PFIZER) 30 MCG/0.3ML IM SUSY: 0.3000 mL | PREFILLED_SYRINGE | Freq: Once | INTRAMUSCULAR | 0 refills | Status: AC

## 2024-03-17 MED ORDER — PAROXETINE HCL 30 MG PO TABS
30.0000 mg | ORAL_TABLET | Freq: Every day | ORAL | 1 refills | Status: AC
Start: 2024-03-17 — End: ?

## 2024-03-17 NOTE — Progress Notes (Incomplete)
 [facility]   Date:  03/19/2024   ID:  Gregory [name], DOB 1960/05/30, MRN [id number]  Location: Home  Provider location: [facility] Type of Visit: Established patient  PCP:  [name], MD  HF Cardiologist: Dr. Cherrie  HPI: Gregory [name] is a 63 y.o. male with h/o obesity, metastatic R renal cell CA s/p R nephrectomy in 9/17 with the subsequent discovery of 2 pulmonary nodules whom we follow for non-ischemic cardiomyopathy.   Developed microscopic hematuria and found to have large right renal mass c/w renal cell CA. Eventually underwent right radical nephrectomy with adrenal sparing on 02/07/16 at [facility]. Post-op developed bradycardia with HRs in 30s while sleeping. BP stable. When awakened had dizziness so given atropine . ECG revealed sinus brady in 40s with mild QT prolongation which resolved. Echo done and EF 45% with global HK. RV dilated  With normal function .   We saw him for the first time in October 2017. At that time we ordered cMRI and stress test. However in the interim he went back to [facility]. Found to have 2 metastatic lung nodules Echo showed EF at 46%. However MUGA with EF 28% so referred back to us  for further evaluation prior to chemo. Underwent cath 04/09/16 as below. Had chemo and lung nodule resection.    1. 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 Otherwise normal coronaries 3. Nonischemic, dilated cardiomyopathy with EF 30-35% and global hypokinesis   EF 12/21 EF 30% RV normal  Echo 12/13/19 EF 20-25%. RV normal  cMRI 05/29/16 EF 28% No LGE cMRI 2/22 EF 20% RV 20% No LGE    Had sleep study which was negative.    Echo 07/29/19 EF 10-15% Mild RV dysfunction. Underwent aggressive med titration with help of PharmDs  Echo 07/10/22: EF 20-25%. RV ok   cMRI 9/24 1. Moderately dilated LV with diffuse hypokinesis, EF 21%. 2. Normal RV szie with  EF 32%. 3. No myocardial LGE, so no definitive evidence for prior MI, infiltrative disease, or myocarditis. 4. Mildly increased extracellular volume percentage, not in cardiac amyloidosis range.  Underwent MDT ICD 12/24.  Today he returns for an acute visit with his wife. Saw EP yesterday and was in AF with RVR. Planning DCCV next week. He feels terrible. He feels tired and weak. Has SOB walking. Denies palpitations, abnormal bleeding, CP, dizziness, edema, or PND/Orthopnea. Appetite ok. Weight at home 264 pounds. Taking all medications. Has not eaten yet today.  Past Medical History:  Diagnosis Date   Allergy    Anemia    low iron   Anxiety    Cancer (HCC)    Renal cell cancer   CHF (congestive heart failure) (HCC)    Complex tear of lateral meniscus of right knee as current injury 03/20/2017   Complex tear of medial meniscus of right knee 03/20/2017   Diabetes mellitus without complication (HCC)    History of gastrointestinal hemorrhage    Hypertension    Hypertriglyceridemia    Hypothyroidism    Macular degeneration    lasar surgery- Dr Alvia   Neuropathy    Nonischemic cardiomyopathy (HCC)    EF 25-30% by echo 09/12/16   Plantar warts    PONV (postoperative nausea and vomiting)     a little bit of nausea   Primary localized osteoarthritis of right knee 03/20/2017   Renal cell carcinoma (HCC)  s/p nephrectomy 02/07/16 at [facility])   Past Surgical History:  Procedure Laterality Date   CARDIAC CATHETERIZATION N/A 04/19/2016   Procedure: Left Heart Cath and Coronary Angiography;  Surgeon: Toribio JONELLE Fuel, MD;  Location: [facility] INVASIVE CV LAB;  Service: Cardiovascular;  Laterality: N/A;   CHONDROPLASTY Right 03/20/2017   Procedure: CHONDROPLASTY;  Surgeon: Josefina Chew, MD;  Location: [facility] OR;  Service: Orthopedics;  Laterality: Right;   COLONOSCOPY  03/22/2005   EYE SURGERY Right    laser surgery, right eye macular degeneration   ICD IMPLANT N/A 05/05/2023    Procedure: ICD IMPLANT;  Surgeon: Fernande Elspeth BROCKS, MD;  Location: [facility] INVASIVE CV LAB;  Service: Cardiovascular;  Laterality: N/A;   KNEE ARTHROSCOPY Right    x 2   KNEE ARTHROSCOPY WITH MEDIAL MENISECTOMY Right 03/20/2017   Procedure: KNEE ARTHROSCOPY WITH MEDIAL AND LATERAL MENISECTOMY;  Surgeon: Josefina Chew, MD;  Location: [facility] OR;  Service: Orthopedics;  Laterality: Right;   NEPHRECTOMY Right    orif fx fibula     REVERSE SHOULDER ARTHROPLASTY Left 06/20/2023   Procedure: REVERSE SHOULDER ARTHROPLASTY REVISION;  Surgeon: [name] Kemps, MD;  Location: [facility] ORS;  Service: Orthopedics;  Laterality: Left;   sebaceous cyst excision  1985   TONSILLECTOMY     TOTAL SHOULDER ARTHROPLASTY Left 11/11/2014   Procedure: LEFT TOTAL SHOULDER ARTHROPLASTY;  Surgeon: Kemps [name], MD;  Location: [facility] OR;  Service: Orthopedics;  Laterality: Left;   TOTAL SHOULDER REVISION Left 06/06/2023   Procedure: SHOULDER ARTHROPLASTY REVISION;again FEB 2025   Surgeon: [name] Kemps, MD;  Location: [facility] ORS;  Service: Orthopedics;  Laterality: Left;  with interscalene block   VASECTOMY     Current Outpatient Medications  Medication Sig Dispense Refill   apixaban  (ELIQUIS ) 5 MG TABS tablet Take 1 tablet (5 mg total) by mouth 2 (two) times daily. 180 tablet 3   atorvastatin  (LIPITOR) 80 MG tablet Take 1 tablet (80 mg total) by mouth daily. 90 tablet 3   carvedilol  (COREG ) 25 MG tablet TAKE 1 TABLET(25 MG) BY MOUTH TWICE DAILY WITH A MEAL 180 tablet 3   cetirizine (ZYRTEC) 10 MG tablet Take 10 mg by mouth daily.     Cholecalciferol  (VITAMIN D ) 50 MCG (2000 UT) CAPS Take 2,000 Units by mouth in the morning and at bedtime.     COVID-19 mRNA vaccine, Pfizer, (COMIRNATY) syringe Inject 0.3 mLs into the muscle once for 1 dose. 0.3 mL 0   digoxin  (LANOXIN ) 0.125 MG tablet TAKE 1 TABLET(125 MCG) BY MOUTH DAILY 90 tablet 3   ENTRESTO  97-103 MG TAKE 1 TABLET BY MOUTH TWICE DAILY 180 tablet 3   FIASP  FLEXTOUCH 100 UNIT/ML FlexTouch  Pen Inject 0-24 Units into the skin in the morning, at noon, and at bedtime. Dose per sliding scale     fluticasone  (CUTIVATE ) 0.05 % cream Apply 1 Application topically daily as needed (irritation).     furosemide  (LASIX ) 40 MG tablet TAKE 1 TABLET BY MOUTH THREE TIMES A WEEK, ON MONDAY, WEDNESDAY, AND FRIDAY. MAY TAKE ADDITIONAL TABLETS AS NEEDED. 180 tablet 3   levothyroxine  (SYNTHROID , LEVOTHROID) 25 MCG tablet TAKE 1 TABLET ONCE DAILY BEFORE BREAKFAST 30 tablet 0   metFORMIN  (GLUCOPHAGE -XR) 500 MG 24 hr tablet Take 500 mg by mouth daily.     PARoxetine  (PAXIL ) 30 MG tablet Take 1 tablet (30 mg total) by mouth daily. 90 tablet 1   spironolactone  (ALDACTONE ) 25 MG tablet TAKE 1/2 TABLET(12.5 MG) BY MOUTH DAILY 45 tablet 3  thiamine  (VITAMIN B1) 100 MG tablet TAKE 1 TABLET(100 MG) BY MOUTH EVERY OTHER DAY 45 tablet 1   tirzepatide  (MOUNJARO ) 15 MG/0.5ML Pen Inject 15 mg into the skin once a week. 2 mL 0   TRESIBA FLEXTOUCH 100 UNIT/ML FlexTouch Pen Inject 14 Units into the skin at bedtime.     No current facility-administered medications for this encounter.   Allergies:   Patient has no known allergies.   Social History:  The patient  reports that he has never smoked. He has never used smokeless tobacco. He reports current alcohol use. He reports that he does not use drugs.   Family History:  The patient's family history includes Bipolar disorder in his mother; Cancer (age of onset: 47) in his mother; Hypertension in his mother; Other in his father.   ROS:  Please see the history of present illness.   All other systems are personally reviewed and negative.   Wt Readings from Last 3 Encounters:  03/19/24 119.7 kg (264 lb)  03/18/24 120.7 kg (266 lb)  03/17/24 119.8 kg (264 lb 3.2 oz)   BP 118/78   Pulse 92   Ht 6' 2 (1.88 m)   Wt 119.7 kg (264 lb)   SpO2 96%   BMI 33.90 kg/m   Physical Exam:   General:  NAD. No resp difficulty, walked into clinic, fatigued-appearing HEENT:  Normal Neck: Supple. JVP 10-12 Cor: Irregular rate & rhythm. No rubs, gallops or murmurs. Lungs: Clear Abdomen: Soft, obese, nontender, nondistended.  Extremities: No cyanosis, clubbing, rash, edema Neuro: Alert & oriented x 3, moves all 4 extremities w/o difficulty. Affect pleasant.  Device interrogation (personally reviewed): OptiVol up x 2 weeks, thoracic impedence down,100% AF, 1.5 hr/day activity, noVT  ASSESSMENT AND PLAN: 1. Chronic systolic HF due to NICM  - Unclear etiology. EF 30-35% by cath with NICM. cMRI 1/18 with EF 28%.  - Etiology remains unclear - possible due to immuno-therapy. No evidence of scar of infiltrative process on MRI.  - Echo 4/18 EF 35-40%. Echo 04/2017 EF 50-55% - Echo 07/29/19 EF 10-15% Mild RV - Echo 12/13/19 EF 20-25%. RV normal.  - Echo 12/21 EF 30% Personally reviewed - cMRI 2/22 EF 20% RV 20% No LGE - Echo 04/18/21 EF 25-30% RV ok   - Echo 07/10/22: EF 20-25%. RV ok  - cMRI 9/24 EF 21% RV EF 32% No LGE - s/p MDT 12/24 - Worse NYHA III-IIIb, volume up on OptiVol, but not markedly so by exam; in setting of AF - Need to get back in rhythm - Will arrange IV Lasix  80 mg + 40 KCL x 1 in infusion clinic now - On Monday, take Lasix  40 mg daily x 5 days, then back to 3x/week - Continue carvedilol  25 mg bid. - Continue Entresto  97/103 mg bid.  - Continue spiro 12.5 mg daily. - No SGLT2i with Type I DM - Genetic evaluation was negative - Lasb reviewed from yesterday and stable, K 4.7, SCr 1.38 - Repeat labs at close follow up  2. AF - Per device interrogation, has been in AF ~ 2 weeks, does not tolerate well. - Has DCCV arranged for next week; with low EF and evidence of volume, I don't think he can wait that long. - Arrange DCCV today with Dr. Cherrie. Discussed with Dr. Cherrie & patient and they are agreeable. - He has not missed any doses of AC; has not had AM meds yet will give Eliquis  5 mg x 1 now -  He has not eaten today - Diurese as above -  Continue Eliquis  5 mg bid. Informed Consent   Shared Decision Making/Informed Consent The risks (stroke, cardiac arrhythmias rarely resulting in the need for a temporary or permanent pacemaker, skin irritation or burns and complications associated with conscious sedation including aspiration, arrhythmia, respiratory failure and death), benefits (restoration of normal sinus rhythm) and alternatives of a direct current cardioversion were explained in detail to Gregory [name] and he agrees to proceed.      3. Metastatic Renal Cell CA with lung nodules - s/p nephrectomy.  - Follows at [facility].  - Felt to be in readmission > 8 years    4. HTN  - BP well controlled - Continue current regimen.  5. CAD - 1v in small RCA.  - No s/s angina - No AC with Eliquis  - Continue atorva 80   6. 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 - Followed by Dr. Faythe  7. Obesity - Body mass index is 33.9 kg/m. - He is on GLP1  Follow up in 2 weeks with APP for fluid/symptom check, post DCCV.  Bonney Harlene CHRISTELLA Glena, FNP  03/19/2024 10:04 AM  [facility] [address] Heart and Vascular [place] [address] [phone] (office) [phone] (fax)

## 2024-03-17 NOTE — Patient Instructions (Signed)
 Atrial Fibrillation Atrial fibrillation (AFib) is a type of irregular or rapid heartbeat (arrhythmia). In AFib, the top part of the heart (atria) beats in an irregular pattern. This makes the heart unable to pump blood normally and effectively. The goal of treatment is to prevent blood clots from forming, control your heart rate, or restore your heartbeat to a normal rhythm. If this condition is not treated, it can cause serious problems, such as a weakened heart muscle (cardiomyopathy) or a stroke. What are the causes? This condition is often caused by medical conditions that damage the heart's electrical system. These include: High blood pressure (hypertension). This is the most common cause. Certain heart problems or conditions, such as heart failure, coronary artery disease, heart valve problems, or heart surgery. Diabetes. Overactive thyroid  (hyperthyroidism). Chronic kidney disease. Certain lung conditions, such as emphysema, pneumonia, or COPD. Obstructive sleep apnea. In some cases, the cause of this condition is not known. What increases the risk? This condition is more likely to develop in: Older adults. Athletes who do endurance exercise. People who have a family history of AFib. Males. People who are Caucasian. People who are obese. People who smoke or misuse alcohol. What are the signs or symptoms? Symptoms of this condition include: Fast or irregular heartbeats (palpitations). Discomfort or pain in your chest. Shortness of breath. Sudden light-headedness or weakness. Tiring easily during exercise or activity. Syncope (fainting). Sweating. In some cases, there are no symptoms. How is this diagnosed? Your health care provider may detect AFib when taking your pulse. If detected, this condition may be diagnosed with: An electrocardiogram (ECG) to check electrical signals of the heart. An ambulatory cardiac monitor to record your heart's activity for a few days. A  transthoracic echocardiogram (TTE) to create pictures of your heart. A transesophageal echocardiogram (TEE) to create even clearer pictures of your heart. A stress test to check your blood supply while you exercise. Imaging tests, such as a CT scan or chest X-ray. Blood tests. How is this treated? Treatment depends on underlying conditions and how you feel when you get AFib. This condition may be treated with: Medicines to prevent blood clots or to treat heart rate or heart rhythm problems. Electrical cardioversion to reset the heart's rhythm. A pacemaker to correct abnormal heart rhythm. Ablation to remove the heart tissue that sends abnormal signals. Left atrial appendage closure to seal the area where blood clots can form. In some cases, underlying conditions will be treated. Follow these instructions at home: Medicines Take over-the counter and prescription medicines only as told by your provider. Do not take any new medicines without talking to your provider. If you are taking blood thinners: Talk with your provider before taking aspirin  or NSAIDs. These medicines can raise your risk of bleeding. Take your medicines as told. Take them at the same time each day. Do not do things that could hurt or bruise you. Be careful to avoid falls. Wear an alert bracelet or carry a card that says that you take blood thinners. Lifestyle Do not use any products that contain nicotine or tobacco. These products include cigarettes, chewing tobacco, and vaping devices, such as e-cigarettes. If you need help quitting, ask your provider. Eat heart-healthy foods. Talk with a food expert (dietitian) to make an eating plan that is right for you. Exercise regularly as told by your provider. Do not drink alcohol. Lose weight if you are overweight. General instructions If you have obstructive sleep apnea, manage your condition as told by your provider.  Do not use diet pills unless your provider approves. Diet  pills can make heart problems worse. Keep all follow-up visits. Your provider will want to check your heart rate and rhythm regularly. Contact a health care provider if: You notice a change in the rate, rhythm, or strength of your heartbeat. You are taking a blood thinner and you notice more bruising. You tire more easily when you exercise or do heavy work. You have a sudden change in weight. Get help right away if:  You have chest pain. You have trouble breathing. You have side effects of blood thinners, such as blood in your vomit, poop (stool), or pee (urine), or bleeding that does not stop. You have any symptoms of a stroke. BE FAST is an easy way to remember the main warning signs of a stroke: B - Balance. Signs are dizziness, sudden trouble walking, or loss of balance. E - Eyes. Signs are trouble seeing or a sudden change in vision. F - Face. Signs are sudden weakness or numbness of the face, or the face or eyelid drooping on one side. A - Arms. Signs are weakness or numbness in an arm. This happens suddenly and usually on one side of the body. S - Speech.Signs are sudden trouble speaking, slurred speech, or trouble understanding what people say. T - Time. Time to call emergency services. Write down what time symptoms started. Other signs of a stroke, such as: A sudden, severe headache with no known cause. Nausea or vomiting. Seizure. These symptoms may be an emergency. Get help right away. Call 911. Do not wait to see if the symptoms will go away. Do not drive yourself to the hospital. This information is not intended to replace advice given to you by your health care provider. Make sure you discuss any questions you have with your health care provider. Document Revised: 01/30/2022 Document Reviewed: 01/30/2022 Elsevier Patient Education  2024 [employer].

## 2024-03-17 NOTE — Progress Notes (Unsigned)
 Subjective:  Patient ID: Gregory [name], male    DOB: March 04, 1961  Age: 63 y.o. MRN: [id number]  CC: Hypothyroidism, Hypertension, Hyperlipidemia, Congestive Heart Failure, Atrial Fibrillation, and Depression   HPI Gregory [name] presents for f/up ---    Discussed the use of AI scribe software for clinical note transcription with the patient, who gave verbal consent to proceed.  History of Present Illness Gregory [name] is a 63 year old male with heart failure and diabetes who presents with shortness of breath and poor sleep.  He has been experiencing heavy breathing and poor sleep over the last couple of weeks. Despite taking Lasix  for fluid retention, he continues to feel 'kind of weird.' He has a low ejection fraction of 30-35% and is due for an echocardiogram in December. No chest pain or swelling, but there is an increase in weight since his last visit, currently around 264 pounds, up from 258-259 pounds. He experiences labored breathing when walking or climbing stairs and lightheadedness when standing up quickly, which he attributes to his blood pressure, currently around 110/60. No irregular heartbeats and his defibrillator has not triggered recently.  He has a history of diabetes with a recent A1c in the high sevens. He recently saw his endocrinologist, who performed blood work but did not check a urine sample.  He has a history of macular degeneration treated with laser surgery about 20 years ago, resulting in a 'dead spot.' He had an eye exam in July with no reported setbacks.  He is due for flu and pneumonia vaccines and has not yet received a COVID vaccine. He plans to get the COVID vaccine at a local pharmacy after recovering from the other vaccines.     Outpatient Medications Prior to Visit  Medication Sig Dispense Refill   apixaban  (ELIQUIS ) 5 MG TABS tablet Take 1 tablet (5 mg total) by mouth 2 (two) times daily. 180 tablet 3   atorvastatin  (LIPITOR) 80 MG tablet Take 1  tablet (80 mg total) by mouth daily. 90 tablet 3   carvedilol  (COREG ) 25 MG tablet TAKE 1 TABLET(25 MG) BY MOUTH TWICE DAILY WITH A MEAL 180 tablet 3   cetirizine (ZYRTEC) 10 MG tablet Take 10 mg by mouth daily.     digoxin  (LANOXIN ) 0.125 MG tablet TAKE 1 TABLET(125 MCG) BY MOUTH DAILY 90 tablet 3   ENTRESTO  97-103 MG TAKE 1 TABLET BY MOUTH TWICE DAILY 180 tablet 3   FIASP  FLEXTOUCH 100 UNIT/ML FlexTouch Pen Inject 0-24 Units into the skin in the morning, at noon, and at bedtime. Dose per sliding scale     fluticasone  (CUTIVATE ) 0.05 % cream Apply 1 Application topically daily as needed (irritation).     furosemide  (LASIX ) 40 MG tablet TAKE 1 TABLET BY MOUTH THREE TIMES A WEEK, ON MONDAY, WEDNESDAY, AND FRIDAY. MAY TAKE ADDITIONAL TABLETS AS NEEDED. 180 tablet 3   levothyroxine  (SYNTHROID , LEVOTHROID) 25 MCG tablet TAKE 1 TABLET ONCE DAILY BEFORE BREAKFAST 30 tablet 0   metFORMIN  (GLUCOPHAGE -XR) 500 MG 24 hr tablet Take 500 mg by mouth daily.     spironolactone  (ALDACTONE ) 25 MG tablet TAKE 1/2 TABLET(12.5 MG) BY MOUTH DAILY 45 tablet 3   thiamine  (VITAMIN B1) 100 MG tablet TAKE 1 TABLET(100 MG) BY MOUTH EVERY OTHER DAY 45 tablet 1   tirzepatide  (MOUNJARO ) 15 MG/0.5ML Pen Inject 15 mg into the skin once a week. 2 mL 0   TRESIBA FLEXTOUCH 100 UNIT/ML FlexTouch Pen Inject 14 Units into the skin at  bedtime.     Acetaminophen  (TYLENOL  PO) Take 400 mg by mouth 2 (two) times daily.     Cholecalciferol  50 MCG (2000 UT) TABS Take 2 tablets (4,000 Units total) by mouth daily. (Patient taking differently: Take 2,000 Units by mouth in the morning and at bedtime.) 180 tablet 1   PARoxetine  (PAXIL ) 30 MG tablet TAKE 1 TABLET(30 MG) BY MOUTH DAILY. FOLLOW UP WITH PCP FOR REFILLS 30 tablet 0   No facility-administered medications prior to visit.    ROS Review of Systems  Constitutional:  Positive for fatigue and unexpected weight change (wt gain). Negative for appetite change, chills and diaphoresis.   HENT: Negative.    Eyes:  Negative for visual disturbance.  Respiratory:  Positive for shortness of breath. Negative for cough, chest tightness and wheezing.   Cardiovascular:  Negative for chest pain, palpitations and leg swelling.  Gastrointestinal: Negative.  Negative for abdominal pain, constipation, diarrhea, nausea and vomiting.  Endocrine: Negative.   Genitourinary: Negative.  Negative for difficulty urinating, dysuria and hematuria.  Musculoskeletal:  Positive for arthralgias. Negative for myalgias.  Skin: Negative.   Neurological:  Positive for dizziness and light-headedness. Negative for weakness and numbness.  Hematological:  Negative for adenopathy. Does not bruise/bleed easily.  Psychiatric/Behavioral: Negative.      Objective:  BP 124/84 (BP Location: Left Arm, Patient Position: Sitting, Cuff Size: Normal)   Pulse 67   Temp 97.8 F (36.6 C) (Oral)   Resp 16   Ht 6' 2 (1.88 m)   Wt 264 lb 3.2 oz (119.8 kg)   SpO2 94%   BMI 33.92 kg/m   BP Readings from Last 3 Encounters:  03/18/24 110/84  03/17/24 124/84  10/07/23 112/60    Wt Readings from Last 3 Encounters:  03/18/24 266 lb (120.7 kg)  03/17/24 264 lb 3.2 oz (119.8 kg)  10/07/23 256 lb 6.4 oz (116.3 kg)    Physical Exam Vitals reviewed.  Constitutional:      General: He is not in acute distress.    Appearance: He is not toxic-appearing or diaphoretic.  HENT:     Nose: Nose normal.     Mouth/Throat:     Mouth: Mucous membranes are moist.  Eyes:     General: No scleral icterus.    Conjunctiva/sclera: Conjunctivae normal.  Cardiovascular:     Rate and Rhythm: Tachycardia present. Rhythm irregularly irregular.     Heart sounds: S1 normal and S2 normal. No murmur heard.    No friction rub. No gallop.     Comments: EKG----  A fib with RVR with PVC's, 118 bpm (new) RBBB LAFB Lateral infarct pattern Pulmonary:     Effort: Pulmonary effort is normal.     Breath sounds: No stridor. No wheezing,  rhonchi or rales.  Abdominal:     General: Abdomen is flat.     Palpations: There is no mass.     Tenderness: There is no abdominal tenderness. There is no guarding.     Hernia: No hernia is present.  Musculoskeletal:     Cervical back: Neck supple.     Right lower leg: No edema.     Left lower leg: No edema.  Skin:    General: Skin is warm and dry.     Coloration: Skin is not pale.     Findings: No rash.  Neurological:     General: No focal deficit present.     Mental Status: He is alert. Mental status is at baseline.  Psychiatric:  Mood and Affect: Mood normal.        Behavior: Behavior normal.     Lab Results  Component Value Date   WBC 7.0 03/17/2024   HGB 14.0 03/17/2024   HCT 43.2 03/17/2024   PLT 219.0 03/17/2024   GLUCOSE 172 (H) 03/17/2024   CHOL 152 12/17/2023   TRIG 295 (A) 12/17/2023   HDL 37 12/17/2023   LDLDIRECT 95.0 04/25/2021   LDLCALC 68 12/17/2023   ALT 27 03/17/2024   AST 22 03/17/2024   NA 141 03/17/2024   K 3.9 03/17/2024   CL 103 03/17/2024   CREATININE 1.42 03/17/2024   BUN 24 (H) 03/17/2024   CO2 31 03/17/2024   TSH 3.15 03/17/2024   PSA 1.44 01/10/2023   INR 0.9 10/27/2019   HGBA1C 8.0 12/17/2023   MICROALBUR 25.9 (H) 03/17/2024    No results found.  Assessment & Plan:  Other dietary vitamin B12 deficiency anemia -     CBC with Differential/Platelet; Future -     Vitamin B12; Future -     Methylmalonic acid, serum; Future  Manifestations of thiamine  deficiency -     CBC with Differential/Platelet; Future -     Folate; Future  Essential hypertension- BP is well controlled. -     Urinalysis, Routine w reflex microscopic; Future -     Hepatic function panel; Future -     Basic metabolic panel with GFR; Future -     TSH; Future  Poorly controlled type 2 diabetes mellitus with complication (HCC) -     Urinalysis, Routine w reflex microscopic; Future -     Hepatic function panel; Future -     Microalbumin / creatinine  urine ratio; Future -     Basic metabolic panel with GFR; Future -     COVID-19 mRNA Vac-TriS(Pfizer); Inject 0.3 mLs into the muscle once for 1 dose. (Patient not taking: Reported on 03/18/2024)  Dispense: 0.3 mL; Refill: 0  Immunization due -     Pneumococcal polysaccharide vaccine 23-valent greater than or equal to 64yo subcutaneous/IM  Need for immunization against influenza -     Flu vaccine trivalent PF, 6mos and older(Flulaval,Afluria,Fluarix,Fluzone)  Atrial fibrillation, unspecified type (HCC) -     EKG 12-Lead -     Ambulatory referral to Cardiology  Depression with anxiety -     PARoxetine  HCl; Take 1 tablet (30 mg total) by mouth daily.  Dispense: 90 tablet; Refill: 1  Atrial fibrillation with RVR (HCC) -     Troponin I (High Sensitivity); Future -     D-dimer, quantitative; Future -     Brain natriuretic peptide; Future  Chronic systolic CHF (congestive heart failure) (HCC)- BNP is elevated but he is euvolemic. -     Troponin I (High Sensitivity); Future -     D-dimer, quantitative; Future -     Brain natriuretic peptide; Future     Follow-up: Return if symptoms worsen or fail to improve.  Debby Molt, MD

## 2024-03-17 NOTE — Telephone Encounter (Signed)
 Spoke with the patient's wife who states that the patient was seen by his PCP today and found out he was in afib. She states that he has not been feeling well. He has been having shortness of breath and trouble sleeping. Patient has an ICD placed by Dr. Fernande last year.  Patient is scheduled for a follow up with [facility] 10/23.

## 2024-03-17 NOTE — H&P (View-Only) (Signed)
 [facility]   Date:  03/19/2024   ID:  Gregory [name], DOB 1960/05/30, MRN [id number]  Location: Home  Provider location: [facility] Type of Visit: Established patient  PCP:  Gregory Debby CROME, MD  HF Cardiologist: Dr. Cherrie  HPI: Gregory [name] is a 63 y.o. male with h/o obesity, metastatic R renal cell CA s/p R nephrectomy in 9/17 with the subsequent discovery of 2 pulmonary nodules whom we follow for non-ischemic cardiomyopathy.   Developed microscopic hematuria and found to have large right renal mass c/w renal cell CA. Eventually underwent right radical nephrectomy with adrenal sparing on 02/07/16 at [facility]. Post-op developed bradycardia with HRs in 30s while sleeping. BP stable. When awakened had dizziness so given atropine . ECG revealed sinus brady in 40s with mild QT prolongation which resolved. Echo done and EF 45% with global HK. RV dilated  With normal function .   We saw him for the first time in October 2017. At that time we ordered cMRI and stress test. However in the interim he went back to [facility]. Found to have 2 metastatic lung nodules Echo showed EF at 46%. However MUGA with EF 28% so referred back to us  for further evaluation prior to chemo. Underwent cath 04/09/16 as below. Had chemo and lung nodule resection.    1. 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 Otherwise normal coronaries 3. Nonischemic, dilated cardiomyopathy with EF 30-35% and global hypokinesis   EF 12/21 EF 30% RV normal  Echo 12/13/19 EF 20-25%. RV normal  cMRI 05/29/16 EF 28% No LGE cMRI 2/22 EF 20% RV 20% No LGE    Had sleep study which was negative.    Echo 07/29/19 EF 10-15% Mild RV dysfunction. Underwent aggressive med titration with help of PharmDs  Echo 07/10/22: EF 20-25%. RV ok   cMRI 9/24 1. Moderately dilated LV with diffuse hypokinesis, EF 21%. 2. Normal RV szie with  EF 32%. 3. No myocardial LGE, so no definitive evidence for prior MI, infiltrative disease, or myocarditis. 4. Mildly increased extracellular volume percentage, not in cardiac amyloidosis range.  Underwent MDT ICD 12/24.  Today he returns for an acute visit with his wife. Saw EP yesterday and was in AF with RVR. Planning DCCV next week. He feels terrible. He feels tired and weak. Has SOB walking. Denies palpitations, abnormal bleeding, CP, dizziness, edema, or PND/Orthopnea. Appetite ok. Weight at home 264 pounds. Taking all medications. Has not eaten yet today.  Past Medical History:  Diagnosis Date   Allergy    Anemia    low iron   Anxiety    Cancer (HCC)    Renal cell cancer   CHF (congestive heart failure) (HCC)    Complex tear of lateral meniscus of right knee as current injury 03/20/2017   Complex tear of medial meniscus of right knee 03/20/2017   Diabetes mellitus without complication (HCC)    History of gastrointestinal hemorrhage    Hypertension    Hypertriglyceridemia    Hypothyroidism    Macular degeneration    lasar surgery- Dr Alvia   Neuropathy    Nonischemic cardiomyopathy (HCC)    EF 25-30% by echo 09/12/16   Plantar warts    PONV (postoperative nausea and vomiting)     a little bit of nausea   Primary localized osteoarthritis of right knee 03/20/2017   Renal cell carcinoma (HCC)  s/p nephrectomy 02/07/16 at [facility])   Past Surgical History:  Procedure Laterality Date   CARDIAC CATHETERIZATION N/A 04/19/2016   Procedure: Left Heart Cath and Coronary Angiography;  Surgeon: Toribio JONELLE Fuel, MD;  Location: [facility] INVASIVE CV LAB;  Service: Cardiovascular;  Laterality: N/A;   CHONDROPLASTY Right 03/20/2017   Procedure: CHONDROPLASTY;  Surgeon: Josefina Chew, MD;  Location: [facility] OR;  Service: Orthopedics;  Laterality: Right;   COLONOSCOPY  03/22/2005   EYE SURGERY Right    laser surgery, right eye macular degeneration   ICD IMPLANT N/A 05/05/2023    Procedure: ICD IMPLANT;  Surgeon: Fernande Elspeth BROCKS, MD;  Location: [facility] INVASIVE CV LAB;  Service: Cardiovascular;  Laterality: N/A;   KNEE ARTHROSCOPY Right    x 2   KNEE ARTHROSCOPY WITH MEDIAL MENISECTOMY Right 03/20/2017   Procedure: KNEE ARTHROSCOPY WITH MEDIAL AND LATERAL MENISECTOMY;  Surgeon: Josefina Chew, MD;  Location: [facility] OR;  Service: Orthopedics;  Laterality: Right;   NEPHRECTOMY Right    orif fx fibula     REVERSE SHOULDER ARTHROPLASTY Left 06/20/2023   Procedure: REVERSE SHOULDER ARTHROPLASTY REVISION;  Surgeon: [name] Kemps, MD;  Location: [facility] ORS;  Service: Orthopedics;  Laterality: Left;   sebaceous cyst excision  1985   TONSILLECTOMY     TOTAL SHOULDER ARTHROPLASTY Left 11/11/2014   Procedure: LEFT TOTAL SHOULDER ARTHROPLASTY;  Surgeon: Kemps [name], MD;  [facility] OR;  Service: Orthopedics;  Laterality: Left;   TOTAL SHOULDER REVISION Left 06/06/2023   Procedure: SHOULDER ARTHROPLASTY REVISION;again FEB 2025   Surgeon: [name] Kemps, MD;  Location: [facility] ORS;  Service: Orthopedics;  Laterality: Left;  with interscalene block   VASECTOMY     Current Outpatient Medications  Medication Sig Dispense Refill   apixaban  (ELIQUIS ) 5 MG TABS tablet Take 1 tablet (5 mg total) by mouth 2 (two) times daily. 180 tablet 3   atorvastatin  (LIPITOR) 80 MG tablet Take 1 tablet (80 mg total) by mouth daily. 90 tablet 3   carvedilol  (COREG ) 25 MG tablet TAKE 1 TABLET(25 MG) BY MOUTH TWICE DAILY WITH A MEAL 180 tablet 3   cetirizine (ZYRTEC) 10 MG tablet Take 10 mg by mouth daily.     Cholecalciferol  (VITAMIN D ) 50 MCG (2000 UT) CAPS Take 2,000 Units by mouth in the morning and at bedtime.     COVID-19 mRNA vaccine, Pfizer, (COMIRNATY) syringe Inject 0.3 mLs into the muscle once for 1 dose. 0.3 mL 0   digoxin  (LANOXIN ) 0.125 MG tablet TAKE 1 TABLET(125 MCG) BY MOUTH DAILY 90 tablet 3   ENTRESTO  97-103 MG TAKE 1 TABLET BY MOUTH TWICE DAILY 180 tablet 3   FIASP  FLEXTOUCH 100 UNIT/ML FlexTouch  Pen Inject 0-24 Units into the skin in the morning, at noon, and at bedtime. Dose per sliding scale     fluticasone  (CUTIVATE ) 0.05 % cream Apply 1 Application topically daily as needed (irritation).     furosemide  (LASIX ) 40 MG tablet TAKE 1 TABLET BY MOUTH THREE TIMES A WEEK, ON MONDAY, WEDNESDAY, AND FRIDAY. MAY TAKE ADDITIONAL TABLETS AS NEEDED. 180 tablet 3   levothyroxine  (SYNTHROID , LEVOTHROID) 25 MCG tablet TAKE 1 TABLET ONCE DAILY BEFORE BREAKFAST 30 tablet 0   metFORMIN  (GLUCOPHAGE -XR) 500 MG 24 hr tablet Take 500 mg by mouth daily.     PARoxetine  (PAXIL ) 30 MG tablet Take 1 tablet (30 mg total) by mouth daily. 90 tablet 1   spironolactone  (ALDACTONE ) 25 MG tablet TAKE 1/2 TABLET(12.5 MG) BY MOUTH DAILY 45 tablet 3  thiamine  (VITAMIN B1) 100 MG tablet TAKE 1 TABLET(100 MG) BY MOUTH EVERY OTHER DAY 45 tablet 1   tirzepatide  (MOUNJARO ) 15 MG/0.5ML Pen Inject 15 mg into the skin once a week. 2 mL 0   TRESIBA FLEXTOUCH 100 UNIT/ML FlexTouch Pen Inject 14 Units into the skin at bedtime.     No current facility-administered medications for this encounter.   Allergies:   Patient has no known allergies.   Social History:  The patient  reports that he has never smoked. He has never used smokeless tobacco. He reports current alcohol use. He reports that he does not use drugs.   Family History:  The patient's family history includes Bipolar disorder in his mother; Cancer (age of onset: 47) in his mother; Hypertension in his mother; Other in his father.   ROS:  Please see the history of present illness.   All other systems are personally reviewed and negative.   Wt Readings from Last 3 Encounters:  03/19/24 119.7 kg (264 lb)  03/18/24 120.7 kg (266 lb)  03/17/24 119.8 kg (264 lb 3.2 oz)   BP 118/78   Pulse 92   Ht 6' 2 (1.88 m)   Wt 119.7 kg (264 lb)   SpO2 96%   BMI 33.90 kg/m   Physical Exam:   General:  NAD. No resp difficulty, walked into clinic, fatigued-appearing HEENT:  Normal Neck: Supple. JVP 10-12 Cor: Irregular rate & rhythm. No rubs, gallops or murmurs. Lungs: Clear Abdomen: Soft, obese, nontender, nondistended.  Extremities: No cyanosis, clubbing, rash, edema Neuro: Alert & oriented x 3, moves all 4 extremities w/o difficulty. Affect pleasant.  Device interrogation (personally reviewed): OptiVol up x 2 weeks, thoracic impedence down,100% AF, 1.5 hr/day activity, noVT  ASSESSMENT AND PLAN: 1. Chronic systolic HF due to NICM  - Unclear etiology. EF 30-35% by cath with NICM. cMRI 1/18 with EF 28%.  - Etiology remains unclear - possible due to immuno-therapy. No evidence of scar of infiltrative process on MRI.  - Echo 4/18 EF 35-40%. Echo 04/2017 EF 50-55% - Echo 07/29/19 EF 10-15% Mild RV - Echo 12/13/19 EF 20-25%. RV normal.  - Echo 12/21 EF 30% Personally reviewed - cMRI 2/22 EF 20% RV 20% No LGE - Echo 04/18/21 EF 25-30% RV ok   - Echo 07/10/22: EF 20-25%. RV ok  - cMRI 9/24 EF 21% RV EF 32% No LGE - s/p MDT 12/24 - Worse NYHA III-IIIb, volume up on OptiVol, but not markedly so by exam; in setting of AF - Need to get back in rhythm - Will arrange IV Lasix  80 mg + 40 KCL x 1 in infusion clinic now - On Monday, take Lasix  40 mg daily x 5 days, then back to 3x/week - Continue carvedilol  25 mg bid. - Continue Entresto  97/103 mg bid.  - Continue spiro 12.5 mg daily. - No SGLT2i with Type I DM - Genetic evaluation was negative - Lasb reviewed from yesterday and stable, K 4.7, SCr 1.38 - Repeat labs at close follow up  2. AF - Per device interrogation, has been in AF ~ 2 weeks, does not tolerate well. - Has DCCV arranged for next week; with low EF and evidence of volume, I don't think he can wait that long. - Arrange DCCV today with Dr. Cherrie. Discussed with Dr. Cherrie & patient and they are agreeable. - He has not missed any doses of AC; has not had AM meds yet will give Eliquis  5 mg x 1 now -  He has not eaten today - Diurese as above -  Continue Eliquis  5 mg bid. Informed Consent   Shared Decision Making/Informed Consent The risks (stroke, cardiac arrhythmias rarely resulting in the need for a temporary or permanent pacemaker, skin irritation or burns and complications associated with conscious sedation including aspiration, arrhythmia, respiratory failure and death), benefits (restoration of normal sinus rhythm) and alternatives of a direct current cardioversion were explained in detail to Mr. Popov and he agrees to proceed.      3. Metastatic Renal Cell CA with lung nodules - s/p nephrectomy.  - Follows at [facility].  - Felt to be in readmission > 8 years    4. HTN  - BP well controlled - Continue current regimen.  5. CAD - 1v in small RCA.  - No s/s angina - No AC with Eliquis  - Continue atorva 80   6. 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 - Followed by Dr. Faythe  7. Obesity - Body mass index is 33.9 kg/m. - He is on GLP1  Follow up in 2 weeks with APP for fluid/symptom check, post DCCV.  Bonney Harlene CHRISTELLA Glena, FNP  03/19/2024 10:04 AM  [facility] [address] Heart and Vascular [place] [address] [phone] (office) [phone] (fax)

## 2024-03-18 ENCOUNTER — Telehealth (HOSPITAL_COMMUNITY): Payer: Self-pay

## 2024-03-18 ENCOUNTER — Encounter: Payer: Self-pay | Admitting: *Deleted

## 2024-03-18 ENCOUNTER — Ambulatory Visit: Attending: Student | Admitting: Student

## 2024-03-18 ENCOUNTER — Encounter: Payer: Self-pay | Admitting: Student

## 2024-03-18 VITALS — BP 110/84 | HR 114 | Ht 74.0 in | Wt 266.0 lb

## 2024-03-18 DIAGNOSIS — I5022 Chronic systolic (congestive) heart failure: Secondary | ICD-10-CM | POA: Diagnosis not present

## 2024-03-18 DIAGNOSIS — I4819 Other persistent atrial fibrillation: Secondary | ICD-10-CM | POA: Diagnosis not present

## 2024-03-18 LAB — URINALYSIS, ROUTINE W REFLEX MICROSCOPIC
Bilirubin Urine: NEGATIVE
Hgb urine dipstick: NEGATIVE
Nitrite: NEGATIVE
RBC / HPF: NONE SEEN (ref 0–?)
Specific Gravity, Urine: 1.025 (ref 1.000–1.030)
Total Protein, Urine: 30 — AB
Urine Glucose: NEGATIVE
Urobilinogen, UA: 1 (ref 0.0–1.0)
pH: 6 (ref 5.0–8.0)

## 2024-03-18 NOTE — Patient Instructions (Addendum)
 Medication Instructions:    Your physician recommends that you continue on your current medications as directed. Please refer to the Current Medication list given to you today.   *If you need a refill on your cardiac medications before your next appointment, please call your pharmacy*   Lab Work:   PLEASE GO DOWN STAIRS  [facility]  FIRST FLOOR   ( GET OFF ELEVATORS WALK TOWARDS WAITING AREA LAB LOCATED BY PHARMACY):  BMET AND CBC TODAY     If you have labs (blood work) drawn today and your tests are completely normal, you will receive your results only by: MyChart Message (if you have MyChart) OR A paper copy in the mail If you have any lab test that is abnormal or we need to change your treatment, we will call you to review the results.    Testing/Procedures: Your physician has recommended that you have a Cardioversion (DCCV). Electrical Cardioversion uses a jolt of electricity to your heart either through paddles or wired patches attached to your chest. This is a controlled, usually prescheduled, procedure. Defibrillation is done under light anesthesia in the hospital, and you usually go home the day of the procedure. This is done to get your heart back into a normal rhythm. You are not awake for the procedure. Please see the instruction sheet given to you today.\      Follow-Up: At [facility], you and your health needs are our priority.  As part of our continuing mission to provide you with exceptional heart care, our providers are all part of one team.  This team includes your primary Cardiologist (physician) and Advanced Practice Providers or APPs (Physician Assistants and Nurse Practitioners) who all work together to provide you with the care you need, when you need it.    Your next appointment:    1-2  week(s)  Provider:    You will follow up in the Atrial Fibrillation Clinic located at [facility]. Your provider will be: Clint R. Fenton, PA-C or Fairy Heinrich, PA-C\   We recommend signing up for the patient portal called MyChart.  Sign up information is provided on this After Visit Summary.  MyChart is used to connect with patients for Virtual Visits (Telemedicine).  Patients are able to view lab/test results, encounter notes, upcoming appointments, etc.  Non-urgent messages can be sent to your provider as well.   To learn more about what you can do with MyChart, go to [email or web address].     Other Instructions  You are scheduled for a Cardioversion   on 03-23-24  with Dr. Lonni  . Please arrive at the  [facility] at 7:30 a.m.  the day of your procedure scheduled at 8:30 am   DIET:            A) Nothing to eat or drink after midnight except your medications with sip of water .   PLEASE GO DOWN STAIRS  [facility]  FIRST FLOOR   ( GET OFF ELEVATORS WALK TOWARDS WAITING AREA LAB LOCATED BY PHARMACY):  BMET AND CBC TODAY   MAKE SURE YOU TAKE YOUR  BLOOD THINNER (  ELIQUIS    ) PLEASE DO NOT MISS ANY DOSES YOU MAY TAKE ALL of your remaining medications with a small amount of water .   Must have a responsible person to drive you home.  Bring a current list of your medications and current insurance cards.   * Special note: Every effort  is made to have your procedure done on time. Occasionally there are emergencies that present themselves at the hospital that may cause delays. Please be patient if a delay does occur.  If you have any questions after you get home, please call the office at  [phone]

## 2024-03-18 NOTE — Telephone Encounter (Signed)
 Called to confirm/remind patient of their appointment at the [facility] on 03/19/24.   Appointment:   [] Confirmed  [x] Left mess   [] No answer/No voice mail  [] VM Full/unable to leave message  [] Phone not in service  And to bring in all medications and/or complete list.

## 2024-03-18 NOTE — Progress Notes (Signed)
 Electrophysiology Office Note:   ID:  MAXDEN NAJI, DOB Jun 24, 1960, MRN [id number]  Primary Cardiologist: None Electrophysiologist: Fonda Kitty, MD      History of Present Illness:   Gregory [name] is a 63 y.o. male with h/o NICM, ICD, and paroxysmal AF seen today for acute visit due to palpitations and sOB.    Patient reports fatigue and SOB over the past several weeks. He felt like he may be in AF, but was noted on EKG at PCP visit yesterday. Denies edema or orthopnea. Has mild DOE. No chest pain or recent illness. Has not missed any doses of anticoagulation.   Review of systems complete and found to be negative unless listed in HPI.   EP Information / Studies Reviewed:    EKG is ordered today. Personal review as below.  EKG Interpretation Date/Time:  Thursday March 18 2024 08:11:56 EDT Ventricular Rate:  114 PR Interval:    QRS Duration:  134 QT Interval:  398 QTC Calculation: 548 R Axis:   270  Text Interpretation: Atrial fibrillation with rapid ventricular response with premature ventricular or aberrantly conducted complexes Left axis deviation Right bundle branch block Confirmed by Lesia Sharper [phone]) on 03/18/2024 8:18:18 AM    ICD Interrogation-  reviewed in detail today,  See PACEART report.  Arrhythmia/Device History MDT Single Chamber ICD implanted 05/05/2023 for CHF   DATE TEST EF    2017 MUGA  28 %    11//17 LHC   % RCA nondominant 90 O/w nonobstructive  12/18 Echo  50-55%    12/21 Echo  30%    2/22 cMRI 20% RV 20%  LGE neg  2/23 Echo  20-25%    2/24 Echo  20-25% RV function normal   9/24 cMRI 21%       Physical Exam:   VS:  BP 110/84   Pulse (!) 114   Ht 6' 2 (1.88 m)   Wt 266 lb (120.7 kg)   SpO2 97%   BMI 34.15 kg/m    Wt Readings from Last 3 Encounters:  03/18/24 266 lb (120.7 kg)  03/17/24 264 lb 3.2 oz (119.8 kg)  10/07/23 256 lb 6.4 oz (116.3 kg)     GEN: No acute distress  NECK: No JVD; No carotid bruits CARDIAC: Regular rate  and rhythm, no murmurs, rubs, gallops RESPIRATORY:  Clear to auscultation without rales, wheezing or rhonchi  ABDOMEN: Soft, non-tender, non-distended EXTREMITIES:  No edema; No deformity   ASSESSMENT AND PLAN:    Chronic systolic CHF  s/p Medtronic single chamber ICD  euvolemic today Stable on an appropriate medical regimen Normal ICD function See Pace Art report No changes today  Persistent AF EKG today shows AF with RVR Continue eliquis  5 mg BID without missed doses. Denies prior missed doses Continue coreg  25 mg BID  Will schedule cardioversion for next available.  Discussed treatment options at length: 1. Ablation - with or without AAD as bridge. 2.Tikosyn - as destination (for now) or bridge to ablation 3. Amiodarone - as bridge to ablation (less optimal with baseline thyroid  dysfunction) 4. DCC without AAD - acknowledging he may have ERAF and be back at square one.   Pt has opted for cardioversion next available and is not ready to commit to AAD or ablation. Will set up with close AF clinic follow up to discuss next steps after.  Disposition:   Follow up with Afib Clinic 1-2 weeks post [facility].    Signed, Sharper Prentice Lesia, PA-C

## 2024-03-19 ENCOUNTER — Ambulatory Visit (HOSPITAL_COMMUNITY): Admitting: Anesthesiology

## 2024-03-19 ENCOUNTER — Encounter (HOSPITAL_COMMUNITY)
Admission: AD | Disposition: A | Discharge status: Home / Self Care | Payer: Self-pay | Source: Ambulatory Visit | Attending: Internal Medicine

## 2024-03-19 ENCOUNTER — Other Ambulatory Visit: Payer: Self-pay

## 2024-03-19 ENCOUNTER — Other Ambulatory Visit (HOSPITAL_COMMUNITY): Payer: Self-pay | Admitting: Family Medicine

## 2024-03-19 ENCOUNTER — Ambulatory Visit (HOSPITAL_BASED_OUTPATIENT_CLINIC_OR_DEPARTMENT_OTHER)
Admission: AD | Admit: 2024-03-19 | Discharge: 2024-03-19 | Disposition: A | Discharge status: Home / Self Care | Source: Ambulatory Visit | Attending: Internal Medicine | Admitting: Internal Medicine

## 2024-03-19 ENCOUNTER — Ambulatory Visit: Payer: Self-pay | Admitting: Student

## 2024-03-19 ENCOUNTER — Ambulatory Visit (HOSPITAL_COMMUNITY)
Admission: RE | Admit: 2024-03-19 | Discharge: 2024-03-19 | Disposition: A | Discharge status: Home / Self Care | Source: Ambulatory Visit | Attending: Family Medicine | Admitting: Family Medicine

## 2024-03-19 ENCOUNTER — Encounter (HOSPITAL_COMMUNITY)
Admission: RE | Admit: 2024-03-19 | Discharge: 2024-03-19 | Disposition: A | Discharge status: Home / Self Care | Source: Ambulatory Visit | Attending: Internal Medicine | Admitting: Internal Medicine

## 2024-03-19 ENCOUNTER — Encounter (HOSPITAL_COMMUNITY): Payer: Self-pay

## 2024-03-19 VITALS — BP 118/78 | HR 92 | Ht 74.0 in | Wt 264.0 lb

## 2024-03-19 VITALS — BP 107/79 | HR 40 | Temp 97.8°F | Resp 16

## 2024-03-19 DIAGNOSIS — I5022 Chronic systolic (congestive) heart failure: Secondary | ICD-10-CM | POA: Insufficient documentation

## 2024-03-19 DIAGNOSIS — Z6833 Body mass index (BMI) 33.0-33.9, adult: Secondary | ICD-10-CM | POA: Diagnosis not present

## 2024-03-19 DIAGNOSIS — I110 Hypertensive heart disease with heart failure: Secondary | ICD-10-CM | POA: Diagnosis not present

## 2024-03-19 DIAGNOSIS — E109 Type 1 diabetes mellitus without complications: Secondary | ICD-10-CM | POA: Diagnosis not present

## 2024-03-19 DIAGNOSIS — Z794 Long term (current) use of insulin: Secondary | ICD-10-CM | POA: Diagnosis not present

## 2024-03-19 DIAGNOSIS — I428 Other cardiomyopathies: Secondary | ICD-10-CM | POA: Insufficient documentation

## 2024-03-19 DIAGNOSIS — I4819 Other persistent atrial fibrillation: Secondary | ICD-10-CM

## 2024-03-19 DIAGNOSIS — Z79899 Other long term (current) drug therapy: Secondary | ICD-10-CM | POA: Insufficient documentation

## 2024-03-19 DIAGNOSIS — Z8051 Family history of malignant neoplasm of kidney: Secondary | ICD-10-CM

## 2024-03-19 DIAGNOSIS — E669 Obesity, unspecified: Secondary | ICD-10-CM | POA: Insufficient documentation

## 2024-03-19 DIAGNOSIS — Z8249 Family history of ischemic heart disease and other diseases of the circulatory system: Secondary | ICD-10-CM | POA: Insufficient documentation

## 2024-03-19 DIAGNOSIS — Z7984 Long term (current) use of oral hypoglycemic drugs: Secondary | ICD-10-CM | POA: Insufficient documentation

## 2024-03-19 DIAGNOSIS — I4891 Unspecified atrial fibrillation: Secondary | ICD-10-CM | POA: Insufficient documentation

## 2024-03-19 DIAGNOSIS — Z85118 Personal history of other malignant neoplasm of bronchus and lung: Secondary | ICD-10-CM | POA: Diagnosis not present

## 2024-03-19 DIAGNOSIS — E785 Hyperlipidemia, unspecified: Secondary | ICD-10-CM | POA: Insufficient documentation

## 2024-03-19 DIAGNOSIS — Z85528 Personal history of other malignant neoplasm of kidney: Secondary | ICD-10-CM | POA: Insufficient documentation

## 2024-03-19 DIAGNOSIS — I2510 Atherosclerotic heart disease of native coronary artery without angina pectoris: Secondary | ICD-10-CM | POA: Insufficient documentation

## 2024-03-19 DIAGNOSIS — Z7985 Long-term (current) use of injectable non-insulin antidiabetic drugs: Secondary | ICD-10-CM | POA: Insufficient documentation

## 2024-03-19 DIAGNOSIS — Z7901 Long term (current) use of anticoagulants: Secondary | ICD-10-CM | POA: Insufficient documentation

## 2024-03-19 DIAGNOSIS — Z905 Acquired absence of kidney: Secondary | ICD-10-CM | POA: Insufficient documentation

## 2024-03-19 DIAGNOSIS — I420 Dilated cardiomyopathy: Secondary | ICD-10-CM | POA: Insufficient documentation

## 2024-03-19 HISTORY — PX: CARDIOVERSION: EP1203

## 2024-03-19 LAB — CBC
Hematocrit: 44.9 % (ref 37.5–51.0)
Hemoglobin: 14.1 g/dL (ref 13.0–17.7)
MCH: 28.7 pg (ref 26.6–33.0)
MCHC: 31.4 g/dL — ABNORMAL LOW (ref 31.5–35.7)
MCV: 91 fL (ref 79–97)
Platelets: 231 x10E3/uL (ref 150–450)
RBC: 4.92 x10E6/uL (ref 4.14–5.80)
RDW: 12.1 % (ref 11.6–15.4)
WBC: 7.8 x10E3/uL (ref 3.4–10.8)

## 2024-03-19 LAB — BASIC METABOLIC PANEL WITH GFR
BUN/Creatinine Ratio: 20 (ref 10–24)
BUN: 28 mg/dL — ABNORMAL HIGH (ref 8–27)
CO2: 23 mmol/L (ref 20–29)
Calcium: 9.1 mg/dL (ref 8.6–10.2)
Chloride: 101 mmol/L (ref 96–106)
Creatinine, Ser: 1.38 mg/dL — ABNORMAL HIGH (ref 0.76–1.27)
Glucose: 181 mg/dL — ABNORMAL HIGH (ref 70–99)
Potassium: 4.7 mmol/L (ref 3.5–5.2)
Sodium: 140 mmol/L (ref 134–144)
eGFR: 57 mL/min/1.73 — ABNORMAL LOW (ref >59)

## 2024-03-19 SURGERY — CARDIOVERSION (CATH LAB)
Anesthesia: General

## 2024-03-19 MED ORDER — PROPOFOL 10 MG/ML IV BOLUS
INTRAVENOUS | Status: DC | PRN
  Administered 2024-03-19: 50 mg via INTRAVENOUS

## 2024-03-19 MED ORDER — POTASSIUM CHLORIDE 20 MEQ PO PACK: 40.0000 meq | PACK | Freq: Once | ORAL | Status: DC

## 2024-03-19 MED ORDER — PHENYLEPHRINE 80 MCG/ML (10ML) SYRINGE FOR IV PUSH (FOR BLOOD PRESSURE SUPPORT)
PREFILLED_SYRINGE | INTRAVENOUS | Status: DC | PRN
Start: 2024-03-19 — End: 2024-03-19
  Administered 2024-03-19: 160 ug via INTRAVENOUS

## 2024-03-19 MED ORDER — SODIUM CHLORIDE 0.9 % IV SOLN: INTRAVENOUS | Status: DC

## 2024-03-19 MED ORDER — FUROSEMIDE 10 MG/ML IJ SOLN: 80.0000 mg | Freq: Once | INTRAMUSCULAR | Status: DC

## 2024-03-19 MED ORDER — FUROSEMIDE 10 MG/ML IJ SOLN
INTRAMUSCULAR | Status: AC
  Filled 2024-03-19: qty 8

## 2024-03-19 MED ORDER — LIDOCAINE 2% (20 MG/ML) 5 ML SYRINGE
INTRAMUSCULAR | Status: DC | PRN
  Administered 2024-03-19: 60 mg via INTRAVENOUS

## 2024-03-19 MED ORDER — FUROSEMIDE 10 MG/ML IJ SOLN
80.0000 mg | Freq: Once | INTRAMUSCULAR | Status: AC
  Administered 2024-03-19: 80 mg via INTRAVENOUS

## 2024-03-19 SURGICAL SUPPLY — 1 items: PAD DEFIB RADIO PHYSIO CONN (PAD) ×2 IMPLANT

## 2024-03-19 NOTE — Discharge Instructions (Signed)

## 2024-03-19 NOTE — Addendum Note (Signed)
 Encounter addended by: Jacon Whetzel M, RN on: 03/19/2024 10:58 AM  Actions taken: Clinical Note Signed

## 2024-03-19 NOTE — Transfer of Care (Signed)
 Immediate Anesthesia Transfer of Care Note  Patient: [name]  Procedure(s) Performed: CARDIOVERSION  Patient Location: PACU and Cath Lab  Anesthesia Type:General  Level of Consciousness: awake  Airway & Oxygen Therapy: Patient Spontanous Breathing and Patient connected to nasal cannula oxygen  Post-op Assessment: Report given to RN and Post -op Vital signs reviewed and stable  Post vital signs: Reviewed  Last Vitals:  Vitals Value Taken Time  BP 103/91 03/19/24 12:36  Temp 98.2   Pulse 94 03/19/24 12:38  Resp 23 03/19/24 12:38  SpO2 97 % 03/19/24 12:38  Vitals shown include unfiled device data.  Last Pain:  Vitals:   03/19/24 1236  TempSrc:   PainSc: Asleep         Complications: There were no known notable events for this encounter.

## 2024-03-19 NOTE — Progress Notes (Signed)
 Medication Samples have been provided to the patient.  Drug name: ELIQUIS        Strength: 5 MG         Qty: 1  LOT: [id number]  Exp.Date: 6/27  Dosing instructions: TAKE 1 TABLET Twice daily   The patient has been instructed regarding the correct time, dose, and frequency of taking this medication, including desired effects and most common side effects.   Keene HERO Buena Boehm 11:30 AM 03/19/2024

## 2024-03-19 NOTE — Addendum Note (Signed)
 Encounter addended by: Aloysuis Ribaudo M, RN on: 03/19/2024 11:32 AM  Actions taken: Clinical Note Signed

## 2024-03-19 NOTE — Progress Notes (Signed)
 As per [name] NP patients cardioversion changed to today instead of Monday. Patient hasn't eaten anything today. Patient received 40 meq potassium  and Eliquis  5 mg . Pt to receive lasix  80 mg IV today  at infusion clinic then to admitting

## 2024-03-19 NOTE — Anesthesia Postprocedure Evaluation (Signed)
 Anesthesia Post Note  Patient: Gregory [name]  Procedure(s) Performed: CARDIOVERSION     Patient location during evaluation: Phase II Anesthesia Type: General Level of consciousness: awake and alert, oriented and patient cooperative Pain management: pain level controlled Vital Signs Assessment: post-procedure vital signs reviewed and stable Respiratory status: spontaneous breathing, nonlabored ventilation and respiratory function stable Cardiovascular status: blood pressure returned to baseline and stable Postop Assessment: no apparent nausea or vomiting Anesthetic complications: no   There were no known notable events for this encounter.  Last Vitals:  Vitals:   03/19/24 1250 03/19/24 1300  BP: (!) 106/92 111/87  Pulse: 89 86  Resp: 20 13  Temp:    SpO2: 94% 95%    Last Pain:  Vitals:   03/19/24 1300  TempSrc:   PainSc: 0-No pain                 Almarie CHRISTELLA Marchi

## 2024-03-19 NOTE — Interval H&P Note (Signed)
 History and Physical Interval Note:  03/19/2024 12:34 PM  Gregory [name]  has presented today for surgery, with the diagnosis of AFIB.  The various methods of treatment have been discussed with the patient and family. After consideration of risks, benefits and other options for treatment, the patient has consented to  Procedure(s): CARDIOVERSION (N/A) as a surgical intervention.  The patient's history has been reviewed, patient examined, no change in status, stable for surgery.  I have reviewed the patient's chart and labs.  Questions were answered to the patient's satisfaction.     Razia Screws

## 2024-03-19 NOTE — Patient Instructions (Addendum)
 Good to see you today!  INCREASE Lasix  to 40 mg daily (starting Monday)for 5 days then back to previous dose  Your physician recommends that you schedule a follow-up appointment  as scheduled  If you have any questions or concerns before your next appointment please send us  a message through [place] or call our office at [phone].    TO LEAVE A MESSAGE FOR THE NURSE SELECT OPTION 2, PLEASE LEAVE A MESSAGE INCLUDING: YOUR NAME DATE OF BIRTH CALL BACK NUMBER REASON FOR CALL**this is important as we prioritize the call backs  YOU WILL RECEIVE A CALL BACK THE SAME DAY AS LONG AS YOU CALL BEFORE 4:00 PM At the [facility], you and your health needs are our priority. As part of our continuing mission to provide you with exceptional heart care, we have created designated Provider Care Teams. These Care Teams include your primary Cardiologist (physician) and Advanced Practice Providers (APPs- Physician Assistants and Nurse Practitioners) who all work together to provide you with the care you need, when you need it.   You may see any of the following providers on your designated Care Team at your next follow up: Dr Toribio Fuel Dr Ezra Shuck Dr. Ria Commander Dr. Morene Brownie Amy Lenetta, NP Caffie Shed, [place] [facility] [place], [place] Beckey Coe, NP [place] Lee, NP Ellouise Class, NP Tinnie Redman, PharmD Jaun Bash, PharmD   Please be sure to bring in all your medications bottles to every appointment.    Thank you for choosing [facility]

## 2024-03-19 NOTE — Addendum Note (Signed)
 Encounter addended by: Glena Harlene HERO, FNP on: 03/19/2024 11:43 AM  Actions taken: Clinical Note Signed

## 2024-03-19 NOTE — CV Procedure (Signed)
    DIRECT CURRENT CARDIOVERSION  NAME:  Gregory [name]   MRN: [id number] DOB:  Nov 07, 1960   ADMIT DATE: 03/19/2024   INDICATIONS: Atrial fibrillation    PROCEDURE:   Informed consent was obtained prior to the procedure. The risks, benefits and alternatives for the procedure were discussed and the patient comprehended these risks. Once an appropriate time out was taken, the patient had the defibrillator pads placed in the anterior and posterior position. The patient then underwent sedation by the anesthesia service. Once an appropriate level of sedation was achieved, the patient received a single biphasic, synchronized 200J shock with prompt conversion to sinus rhythm. No apparent complications.  Toribio Fuel, MD  12:35 PM

## 2024-03-19 NOTE — Anesthesia Preprocedure Evaluation (Addendum)
 Anesthesia Evaluation  Patient identified by MRN, date of birth, ID band Patient awake    Reviewed: Allergy & Precautions, NPO status , Patient's Chart, lab work & pertinent test results  History of Anesthesia Complications (+) PONV and history of anesthetic complications  Airway Mallampati: III      Comment: Previous grade II view with Miller 3, easy mask with OPA Dental  (+) Dental Advisory Given   Pulmonary neg shortness of breath, neg sleep apnea, neg COPD, neg recent URI Lung cancer   Pulmonary exam normal breath sounds clear to auscultation       Cardiovascular hypertension, (-) angina + CAD, +CHF, + Orthopnea (2 pillows) and + PND  + dysrhythmias (bigeminy) Atrial Fibrillation + Cardiac Defibrillator  Rhythm:Irregular Rate:Normal  HLD  TTE 07/10/2022: IMPRESSIONS    1. Left ventricular ejection fraction, by estimation, is 20 to 25%. The  left ventricle has severely decreased function. The left ventricle  demonstrates global hypokinesis. The left ventricular internal cavity size  was mildly to moderately dilated. Left  ventricular diastolic parameters are consistent with Grade I diastolic  dysfunction (impaired relaxation).   2. Right ventricular systolic function is normal. The right ventricular  size is normal.   3. Left atrial size was moderately dilated.   4. The mitral valve is grossly normal. Trivial mitral valve  regurgitation. No evidence of mitral stenosis.   5. The aortic valve is tricuspid. There is mild calcification of the  aortic valve. Aortic valve regurgitation is trivial. No aortic stenosis is  present.   6. Aortic dilatation noted. There is borderline dilatation of the aortic  root, measuring 38 mm.   7. The inferior vena cava is normal in size with greater than 50%  respiratory variability, suggesting right atrial pressure of 3 mmHg.     Neuro/Psych neg Seizures PSYCHIATRIC DISORDERS Anxiety       Neuromuscular disease (neuropathy)    GI/Hepatic Neg liver ROS,neg GERD  ,,H/o GI hemorrhage   Endo/Other  diabetes, Oral Hypoglycemic AgentsHypothyroidism    Renal/GU Renal disease (renal cell carcinoma)     Musculoskeletal  (+) Arthritis , Osteoarthritis,    Abdominal  (+) + obese  Peds  Hematology  (+) Blood dyscrasia, anemia Lab Results      Component                Value               Date                      WBC                      7.8                 03/18/2024                HGB                      14.1                03/18/2024                HCT                      44.9                03/18/2024  MCV                      91                  03/18/2024                PLT                      231                 03/18/2024              Anesthesia Other Findings Last Eliquis : today  Last Mounjaro : 03/19/2024, on this dose for 6-8 months, denies abdominal bloating or fullness, nausea, vomiting, or other GI symptoms   Reproductive/Obstetrics                              Anesthesia Physical Anesthesia Plan  ASA: 4  Anesthesia Plan: General   Post-op Pain Management:    Induction: Intravenous  PONV Risk Score and Plan: 3 and Treatment may vary due to age or medical condition  Airway Management Planned: Natural Airway and Nasal Cannula  Additional Equipment:   Intra-op Plan:   Post-operative Plan:   Informed Consent: I have reviewed the patients History and Physical, chart, labs and discussed the procedure including the risks, benefits and alternatives for the proposed anesthesia with the patient or authorized representative who has indicated his/her understanding and acceptance.     Dental advisory given  Plan Discussed with: CRNA and Anesthesiologist  Anesthesia Plan Comments: (Discussed with patient risks of MAC including, but not limited to, minor pain or discomfort, hearing people in the room, and  possible need for backup general anesthesia. Risks for general anesthesia also discussed including, but not limited to, sore throat, hoarse voice, chipped/damaged teeth, injury to vocal cords, nausea and vomiting, allergic reactions, lung infection, heart attack, stroke, and death. All questions answered. )         Anesthesia Quick Evaluation

## 2024-03-19 NOTE — Addendum Note (Signed)
 Encounter addended by: Glena Harlene HERO, FNP on: 03/19/2024 11:46 AM  Actions taken: Clinical Note Signed

## 2024-03-19 NOTE — Addendum Note (Signed)
 Encounter addended by: Cass Edinger M, RN on: 03/19/2024 10:55 AM  Actions taken: Clinical Note Signed

## 2024-03-20 ENCOUNTER — Encounter (HOSPITAL_COMMUNITY): Payer: Self-pay | Admitting: Internal Medicine

## 2024-03-20 LAB — D-DIMER, QUANTITATIVE: D-Dimer, Quant: 0.53 ug{FEU}/mL — ABNORMAL HIGH

## 2024-03-20 LAB — METHYLMALONIC ACID, SERUM: Methylmalonic Acid, Quant: 161 nmol/L (ref 69–390)

## 2024-03-23 DIAGNOSIS — I4819 Other persistent atrial fibrillation: Secondary | ICD-10-CM

## 2024-03-24 ENCOUNTER — Ambulatory Visit (HOSPITAL_COMMUNITY)

## 2024-03-24 NOTE — Progress Notes (Signed)
 Remote ICD transmission.

## 2024-03-24 NOTE — Addendum Note (Signed)
 Addendum  created 03/24/24 2125 by Erma Thom SAUNDERS, MD   Attestation recorded in Intraprocedure, Intraprocedure Attestations filed

## 2024-03-31 ENCOUNTER — Telehealth (HOSPITAL_COMMUNITY): Payer: Self-pay

## 2024-03-31 NOTE — Telephone Encounter (Signed)
 Called to confirm/remind patient of their appointment at the [facility] on 04/01/24.   Appointment:   [x] Confirmed  [] Left mess   [] No answer/No voice mail  [] VM Full/unable to leave message  [] Phone not in service  Patient reminded to bring all medications and/or complete list.  Confirmed patient has transportation. Gave directions, instructed to utilize valet parking.

## 2024-04-01 ENCOUNTER — Emergency Department (HOSPITAL_COMMUNITY)

## 2024-04-01 ENCOUNTER — Encounter (HOSPITAL_COMMUNITY): Payer: Self-pay

## 2024-04-01 ENCOUNTER — Other Ambulatory Visit: Payer: Self-pay

## 2024-04-01 ENCOUNTER — Inpatient Hospital Stay (HOSPITAL_COMMUNITY)
Admission: EM | Admit: 2024-04-01 | Discharge: 2024-04-04 | DRG: 308 | Disposition: A | Discharge status: Home / Self Care | Attending: Internal Medicine | Admitting: Internal Medicine

## 2024-04-01 ENCOUNTER — Ambulatory Visit (HOSPITAL_COMMUNITY)
Admission: RE | Admit: 2024-04-01 | Discharge: 2024-04-01 | Disposition: A | Source: Ambulatory Visit | Attending: Physician Assistant

## 2024-04-01 ENCOUNTER — Encounter (HOSPITAL_COMMUNITY): Payer: Self-pay | Admitting: Emergency Medicine

## 2024-04-01 VITALS — BP 120/84 | HR 102 | Wt 260.2 lb

## 2024-04-01 DIAGNOSIS — H3530 Unspecified macular degeneration: Secondary | ICD-10-CM | POA: Diagnosis present

## 2024-04-01 DIAGNOSIS — R918 Other nonspecific abnormal finding of lung field: Secondary | ICD-10-CM | POA: Insufficient documentation

## 2024-04-01 DIAGNOSIS — Z6833 Body mass index (BMI) 33.0-33.9, adult: Secondary | ICD-10-CM | POA: Diagnosis not present

## 2024-04-01 DIAGNOSIS — K219 Gastro-esophageal reflux disease without esophagitis: Secondary | ICD-10-CM | POA: Diagnosis present

## 2024-04-01 DIAGNOSIS — Z95810 Presence of automatic (implantable) cardiac defibrillator: Secondary | ICD-10-CM | POA: Diagnosis not present

## 2024-04-01 DIAGNOSIS — I517 Cardiomegaly: Secondary | ICD-10-CM | POA: Diagnosis not present

## 2024-04-01 DIAGNOSIS — I4819 Other persistent atrial fibrillation: Secondary | ICD-10-CM | POA: Diagnosis not present

## 2024-04-01 DIAGNOSIS — I4891 Unspecified atrial fibrillation: Principal | ICD-10-CM | POA: Diagnosis present

## 2024-04-01 DIAGNOSIS — I5023 Acute on chronic systolic (congestive) heart failure: Secondary | ICD-10-CM | POA: Diagnosis not present

## 2024-04-01 DIAGNOSIS — E1042 Type 1 diabetes mellitus with diabetic polyneuropathy: Secondary | ICD-10-CM | POA: Diagnosis present

## 2024-04-01 DIAGNOSIS — I428 Other cardiomyopathies: Secondary | ICD-10-CM | POA: Insufficient documentation

## 2024-04-01 DIAGNOSIS — Z79890 Hormone replacement therapy (postmenopausal): Secondary | ICD-10-CM | POA: Diagnosis not present

## 2024-04-01 DIAGNOSIS — D6869 Other thrombophilia: Secondary | ICD-10-CM | POA: Diagnosis present

## 2024-04-01 DIAGNOSIS — Z794 Long term (current) use of insulin: Secondary | ICD-10-CM | POA: Diagnosis not present

## 2024-04-01 DIAGNOSIS — R0602 Shortness of breath: Secondary | ICD-10-CM | POA: Diagnosis not present

## 2024-04-01 DIAGNOSIS — E781 Pure hyperglyceridemia: Secondary | ICD-10-CM | POA: Diagnosis present

## 2024-04-01 DIAGNOSIS — I480 Paroxysmal atrial fibrillation: Secondary | ICD-10-CM

## 2024-04-01 DIAGNOSIS — I110 Hypertensive heart disease with heart failure: Secondary | ICD-10-CM | POA: Diagnosis present

## 2024-04-01 DIAGNOSIS — E669 Obesity, unspecified: Secondary | ICD-10-CM | POA: Diagnosis present

## 2024-04-01 DIAGNOSIS — Z7985 Long-term (current) use of injectable non-insulin antidiabetic drugs: Secondary | ICD-10-CM

## 2024-04-01 DIAGNOSIS — E039 Hypothyroidism, unspecified: Secondary | ICD-10-CM | POA: Diagnosis present

## 2024-04-01 DIAGNOSIS — Z7901 Long term (current) use of anticoagulants: Secondary | ICD-10-CM

## 2024-04-01 DIAGNOSIS — Z85528 Personal history of other malignant neoplasm of kidney: Secondary | ICD-10-CM | POA: Diagnosis not present

## 2024-04-01 DIAGNOSIS — I2510 Atherosclerotic heart disease of native coronary artery without angina pectoris: Secondary | ICD-10-CM | POA: Diagnosis present

## 2024-04-01 DIAGNOSIS — Z808 Family history of malignant neoplasm of other organs or systems: Secondary | ICD-10-CM

## 2024-04-01 DIAGNOSIS — Z8249 Family history of ischemic heart disease and other diseases of the circulatory system: Secondary | ICD-10-CM | POA: Diagnosis not present

## 2024-04-01 DIAGNOSIS — Z905 Acquired absence of kidney: Secondary | ICD-10-CM

## 2024-04-01 DIAGNOSIS — I420 Dilated cardiomyopathy: Secondary | ICD-10-CM | POA: Diagnosis present

## 2024-04-01 DIAGNOSIS — Z79899 Other long term (current) drug therapy: Secondary | ICD-10-CM | POA: Diagnosis not present

## 2024-04-01 DIAGNOSIS — C7800 Secondary malignant neoplasm of unspecified lung: Secondary | ICD-10-CM | POA: Diagnosis present

## 2024-04-01 DIAGNOSIS — Z7984 Long term (current) use of oral hypoglycemic drugs: Secondary | ICD-10-CM | POA: Diagnosis not present

## 2024-04-01 DIAGNOSIS — Z96612 Presence of left artificial shoulder joint: Secondary | ICD-10-CM | POA: Diagnosis not present

## 2024-04-01 DIAGNOSIS — R5383 Other fatigue: Secondary | ICD-10-CM | POA: Insufficient documentation

## 2024-04-01 DIAGNOSIS — I5022 Chronic systolic (congestive) heart failure: Secondary | ICD-10-CM | POA: Diagnosis not present

## 2024-04-01 DIAGNOSIS — Z818 Family history of other mental and behavioral disorders: Secondary | ICD-10-CM

## 2024-04-01 DIAGNOSIS — R0601 Orthopnea: Secondary | ICD-10-CM | POA: Insufficient documentation

## 2024-04-01 DIAGNOSIS — I10 Essential (primary) hypertension: Secondary | ICD-10-CM

## 2024-04-01 DIAGNOSIS — I429 Cardiomyopathy, unspecified: Secondary | ICD-10-CM | POA: Diagnosis not present

## 2024-04-01 DIAGNOSIS — E109 Type 1 diabetes mellitus without complications: Secondary | ICD-10-CM | POA: Insufficient documentation

## 2024-04-01 HISTORY — DX: Unspecified atrial fibrillation: I48.91

## 2024-04-01 LAB — BASIC METABOLIC PANEL WITH GFR
Anion gap: 10 (ref 5–15)
BUN: 27 mg/dL — ABNORMAL HIGH (ref 8–23)
CO2: 25 mmol/L (ref 22–32)
Calcium: 8.8 mg/dL — ABNORMAL LOW (ref 8.9–10.3)
Chloride: 104 mmol/L (ref 98–111)
Creatinine, Ser: 1.22 mg/dL (ref 0.61–1.24)
GFR, Estimated: >60 mL/min (ref >60)
Glucose, Bld: 202 mg/dL — ABNORMAL HIGH (ref 70–99)
Potassium: 4 mmol/L (ref 3.5–5.1)
Sodium: 139 mmol/L (ref 135–145)

## 2024-04-01 LAB — CBC
HCT: 42.3 % (ref 39.0–52.0)
Hemoglobin: 13.7 g/dL (ref 13.0–17.0)
MCH: 28.6 pg (ref 26.0–34.0)
MCHC: 32.4 g/dL (ref 30.0–36.0)
MCV: 88.3 fL (ref 80.0–100.0)
Platelets: 234 K/uL (ref 150–400)
RBC: 4.79 MIL/uL (ref 4.22–5.81)
RDW: 12.8 % (ref 11.5–15.5)
WBC: 6.7 K/uL (ref 4.0–10.5)
nRBC: 0 % (ref 0.0–0.2)

## 2024-04-01 LAB — HIV ANTIBODY (ROUTINE TESTING W REFLEX): HIV Screen 4th Generation wRfx: NONREACTIVE

## 2024-04-01 LAB — GLUCOSE, CAPILLARY
Glucose-Capillary: 127 mg/dL — ABNORMAL HIGH (ref 70–99)
Glucose-Capillary: 291 mg/dL — ABNORMAL HIGH (ref 70–99)

## 2024-04-01 LAB — MAGNESIUM: Magnesium: 2.1 mg/dL (ref 1.7–2.4)

## 2024-04-01 LAB — BRAIN NATRIURETIC PEPTIDE: B Natriuretic Peptide: 1721 pg/mL — ABNORMAL HIGH (ref 0.0–100.0)

## 2024-04-01 MED ORDER — SODIUM CHLORIDE 0.9 % IV SOLN: 250.0000 mL | INTRAVENOUS | Status: AC | PRN

## 2024-04-01 MED ORDER — SODIUM CHLORIDE 0.9% FLUSH
3.0000 mL | Freq: Two times a day (BID) | INTRAVENOUS | Status: DC
  Administered 2024-04-01 – 2024-04-03 (×5): 3 mL via INTRAVENOUS

## 2024-04-01 MED ORDER — CARVEDILOL 12.5 MG PO TABS
12.5000 mg | ORAL_TABLET | Freq: Two times a day (BID) | ORAL | Status: DC
  Administered 2024-04-01 – 2024-04-04 (×6): 12.5 mg via ORAL
  Filled 2024-04-01 (×6): qty 1

## 2024-04-01 MED ORDER — AMIODARONE LOAD VIA INFUSION
150.0000 mg | Freq: Once | INTRAVENOUS | Status: AC
  Administered 2024-04-01: 150 mg via INTRAVENOUS
  Filled 2024-04-01: qty 83.34

## 2024-04-01 MED ORDER — ONDANSETRON HCL 4 MG/2ML IJ SOLN
4.0000 mg | Freq: Four times a day (QID) | INTRAMUSCULAR | Status: DC | PRN
Start: 2024-04-01 — End: 2024-04-04
  Administered 2024-04-02: 4 mg via INTRAVENOUS
  Filled 2024-04-01: qty 2

## 2024-04-01 MED ORDER — ACETAMINOPHEN 325 MG PO TABS: 650.0000 mg | ORAL_TABLET | ORAL | Status: DC | PRN

## 2024-04-01 MED ORDER — AMIODARONE HCL IN DEXTROSE 360-4.14 MG/200ML-% IV SOLN
30.0000 mg/h | INTRAVENOUS | Status: AC
  Administered 2024-04-02: 30 mg/h via INTRAVENOUS
  Filled 2024-04-01 (×2): qty 200

## 2024-04-01 MED ORDER — AMIODARONE HCL IN DEXTROSE 360-4.14 MG/200ML-% IV SOLN
60.0000 mg/h | INTRAVENOUS | Status: AC
  Administered 2024-04-01 (×2): 60 mg/h via INTRAVENOUS
  Filled 2024-04-01: qty 200

## 2024-04-01 MED ORDER — FUROSEMIDE 10 MG/ML IJ SOLN: 40.0000 mg | Freq: Once | INTRAMUSCULAR | Status: DC

## 2024-04-01 MED ORDER — APIXABAN 5 MG PO TABS
5.0000 mg | ORAL_TABLET | Freq: Two times a day (BID) | ORAL | Status: DC
  Administered 2024-04-01 – 2024-04-04 (×7): 5 mg via ORAL
  Filled 2024-04-01 (×7): qty 1

## 2024-04-01 MED ORDER — DIGOXIN 125 MCG PO TABS
0.1250 mg | ORAL_TABLET | Freq: Every day | ORAL | Status: DC
  Administered 2024-04-01 – 2024-04-04 (×4): 0.125 mg via ORAL
  Filled 2024-04-01 (×4): qty 1

## 2024-04-01 MED ORDER — INSULIN GLARGINE-YFGN 100 UNIT/ML ~~LOC~~ SOLN
14.0000 [IU] | Freq: Every day | SUBCUTANEOUS | Status: DC
  Administered 2024-04-01 – 2024-04-03 (×3): 14 [IU] via SUBCUTANEOUS
  Filled 2024-04-01 (×4): qty 0.14

## 2024-04-01 MED ORDER — PAROXETINE HCL 30 MG PO TABS
30.0000 mg | ORAL_TABLET | Freq: Every day | ORAL | Status: DC
  Administered 2024-04-02 – 2024-04-04 (×3): 30 mg via ORAL
  Filled 2024-04-01 (×3): qty 1

## 2024-04-01 MED ORDER — FUROSEMIDE 10 MG/ML IJ SOLN
80.0000 mg | Freq: Two times a day (BID) | INTRAMUSCULAR | Status: DC
  Administered 2024-04-01: 80 mg via INTRAVENOUS
  Filled 2024-04-01: qty 8

## 2024-04-01 MED ORDER — DIGOXIN 125 MCG PO TABS: 0.1250 mg | ORAL_TABLET | Freq: Every day | ORAL | Status: DC

## 2024-04-01 MED ORDER — ATORVASTATIN CALCIUM 80 MG PO TABS
80.0000 mg | ORAL_TABLET | Freq: Every day | ORAL | Status: DC
  Administered 2024-04-02 – 2024-04-04 (×3): 80 mg via ORAL
  Filled 2024-04-01 (×3): qty 1

## 2024-04-01 MED ORDER — SODIUM CHLORIDE 0.9% FLUSH: 3.0000 mL | INTRAVENOUS | Status: DC | PRN

## 2024-04-01 MED ORDER — FUROSEMIDE 10 MG/ML IJ SOLN: 80.0000 mg | Freq: Two times a day (BID) | INTRAMUSCULAR | Status: DC

## 2024-04-01 MED ORDER — SACUBITRIL-VALSARTAN 49-51 MG PO TABS
1.0000 | ORAL_TABLET | Freq: Two times a day (BID) | ORAL | Status: DC
  Administered 2024-04-01 – 2024-04-04 (×6): 1 via ORAL
  Filled 2024-04-01 (×6): qty 1

## 2024-04-01 MED ORDER — SODIUM CHLORIDE 0.9 % IV SOLN: INTRAVENOUS | Status: DC

## 2024-04-01 MED ORDER — APIXABAN 5 MG PO TABS: 5.0000 mg | ORAL_TABLET | Freq: Two times a day (BID) | ORAL | Status: DC

## 2024-04-01 MED ORDER — LEVOTHYROXINE SODIUM 25 MCG PO TABS
25.0000 ug | ORAL_TABLET | Freq: Every day | ORAL | Status: DC
  Administered 2024-04-03 – 2024-04-04 (×2): 25 ug via ORAL
  Filled 2024-04-01 (×3): qty 1

## 2024-04-01 MED ORDER — INSULIN ASPART 100 UNIT/ML IJ SOLN
0.0000 [IU] | Freq: Three times a day (TID) | INTRAMUSCULAR | Status: DC
  Administered 2024-04-01: 3 [IU] via SUBCUTANEOUS
  Administered 2024-04-02: 11 [IU] via SUBCUTANEOUS
  Administered 2024-04-02: 15 [IU] via SUBCUTANEOUS
  Administered 2024-04-02: 3 [IU] via SUBCUTANEOUS
  Administered 2024-04-03: 11 [IU] via SUBCUTANEOUS
  Administered 2024-04-03: 4 [IU] via SUBCUTANEOUS
  Administered 2024-04-03: 11 [IU] via SUBCUTANEOUS
  Administered 2024-04-04: 7 [IU] via SUBCUTANEOUS
  Filled 2024-04-01: qty 11
  Filled 2024-04-01: qty 3
  Filled 2024-04-01: qty 4
  Filled 2024-04-01: qty 7
  Filled 2024-04-01: qty 11
  Filled 2024-04-01: qty 3
  Filled 2024-04-01: qty 15
  Filled 2024-04-01: qty 11

## 2024-04-01 NOTE — Progress Notes (Signed)
 Heart Failure Navigator Progress Note  Assessed for Heart & Vascular TOC clinic readiness.  Patient does not meet criteria due to she is an Advanced Heart Failure Team patient of Dr. Bensimhon. .   Navigator will sign off at this time.   Stephane Haddock, BSN, Scientist, Clinical (histocompatibility And Immunogenetics) Only

## 2024-04-01 NOTE — ED Triage Notes (Signed)
 Pt. Stated, Im in A-Fib since Oct 31 off and on. Ive had some SOB and can feel my heat .

## 2024-04-01 NOTE — ED Provider Notes (Signed)
 [facility] AT [facility] Provider Note   CSN: [id number] Arrival date & time: 04/01/24  1022     Patient presents with: Atrial Fibrillation, Shortness of Breath, and Palpitations   Gregory [name] is a 63 y.o. male.   HPI 63 year old male presents with recurrent Afib and CHF. He was sent in by the cardiology clinic for admission. He had a cardioversion over a week ago.  Around Halloween he has apparently been back into A-fib according to his implanted cardiac device.  He has been compliant with his Eliquis  (except for this AM). He hasn't felt palpitations but has felt recurrent dyspnea when laying flat or with exertion, which often happens when he's in Afib. No chest pain. No leg swelling.   Prior to Admission medications   Medication Sig Start Date End Date Taking? Authorizing Provider  apixaban  (ELIQUIS ) 5 MG TABS tablet Take 1 tablet (5 mg total) by mouth 2 (two) times daily. 10/07/23   Waddell Danelle ORN, MD  atorvastatin  (LIPITOR) 80 MG tablet Take 1 tablet (80 mg total) by mouth daily. 01/21/24   Bensimhon, Toribio SAUNDERS, MD  carvedilol  (COREG ) 25 MG tablet TAKE 1 TABLET(25 MG) BY MOUTH TWICE DAILY WITH A MEAL 04/22/23   Bensimhon, Toribio SAUNDERS, MD  cetirizine (ZYRTEC) 10 MG tablet Take 10 mg by mouth daily.    [provider]  Cholecalciferol  (VITAMIN D ) 50 MCG (2000 UT) CAPS Take 2,000 Units by mouth in the morning and at bedtime.    [provider]  digoxin  (LANOXIN ) 0.125 MG tablet TAKE 1 TABLET(125 MCG) BY MOUTH DAILY 04/22/23   Bensimhon, Toribio SAUNDERS, MD  ENTRESTO  97-103 MG TAKE 1 TABLET BY MOUTH TWICE DAILY 04/22/23   Bensimhon, Toribio SAUNDERS, MD  FIASP  FLEXTOUCH 100 UNIT/ML FlexTouch Pen Inject 0-24 Units into the skin in the morning, at noon, and at bedtime. Dose per sliding scale 01/08/23   [provider]  fluticasone  (CUTIVATE ) 0.05 % cream Apply 1 Application topically daily as needed (irritation). 06/15/20   [provider]   furosemide  (LASIX ) 40 MG tablet TAKE 1 TABLET BY MOUTH THREE TIMES A WEEK, ON MONDAY, WEDNESDAY, AND FRIDAY. MAY TAKE ADDITIONAL TABLETS AS NEEDED. 08/26/23   Bensimhon, Toribio SAUNDERS, MD  levothyroxine  (SYNTHROID , LEVOTHROID) 25 MCG tablet TAKE 1 TABLET ONCE DAILY BEFORE BREAKFAST 06/17/17   Curcio, Kristin R, NP  metFORMIN  (GLUCOPHAGE -XR) 500 MG 24 hr tablet Take 500 mg by mouth daily.    [provider]  PARoxetine  (PAXIL ) 30 MG tablet Take 1 tablet (30 mg total) by mouth daily. 03/17/24   [name], MD  spironolactone  (ALDACTONE ) 25 MG tablet TAKE 1/2 TABLET(12.5 MG) BY MOUTH DAILY 07/21/23   Bensimhon, Toribio SAUNDERS, MD  thiamine  (VITAMIN B1) 100 MG tablet TAKE 1 TABLET(100 MG) BY MOUTH EVERY OTHER DAY 04/24/22   [name], MD  tirzepatide  (MOUNJARO ) 15 MG/0.5ML Pen Inject 15 mg into the skin once a week. 08/12/22   [name], MD  TRESIBA FLEXTOUCH 100 UNIT/ML FlexTouch Pen Inject 14 Units into the skin at bedtime.    [provider]    Allergies: Patient has no known allergies.    Review of Systems  Respiratory:  Positive for shortness of breath.   Cardiovascular:  Negative for chest pain, palpitations and leg swelling.    Updated Vital Signs BP (!) 116/92   Pulse (!) 46   Temp 98.6 F (37 C)   Resp 16   Ht  6' 2 (1.88 m)   Wt 118 kg   SpO2 97%   BMI 33.41 kg/m   Physical Exam Vitals and nursing note reviewed.  Constitutional:      Appearance: He is well-developed.  HENT:     Head: Normocephalic and atraumatic.  Cardiovascular:     Rate and Rhythm: Normal rate. Rhythm irregular.     Heart sounds: Normal heart sounds.  Pulmonary:     Effort: Pulmonary effort is normal.     Breath sounds: Normal breath sounds.  Abdominal:     General: There is no distension.  Musculoskeletal:     Right lower leg: No edema.     Left lower leg: No edema.  Skin:    General: Skin is warm and dry.  Neurological:     Mental Status: He is alert.     (all labs  ordered are listed, but only abnormal results are displayed) Labs Reviewed  BASIC METABOLIC PANEL WITH GFR - Abnormal; Notable for the following components:      Result Value   Glucose, Bld 202 (*)    BUN 27 (*)    Calcium  8.8 (*)    All other components within normal limits  CBC  MAGNESIUM  BRAIN NATRIURETIC PEPTIDE  HIV ANTIBODY (ROUTINE TESTING W REFLEX)    EKG: None  Radiology: DG Chest 2 View Result Date: 04/01/2024 EXAM: 2 VIEW(S) XRAY OF THE CHEST 04/01/2024 11:13:00 AM COMPARISON: Chest radiograph dated 05/05/2023 and 07/26/2019. CLINICAL HISTORY: SOB FINDINGS: LUNGS AND PLEURA: Similar left basilar scarring. No appreciable acute consolidation. No definite pleural effusion or pneumothorax. HEART AND MEDIASTINUM: Stable cardiomegaly. Single lead left-sided defibrillator. BONES AND SOFT TISSUES: Partially visualized left reverse shoulder arthroplasty. No acute osseous abnormality. IMPRESSION: 1. No acute cardiopulmonary findings. 2. Stable cardiomegaly. Electronically signed by: Shahmeer Lateef MD 04/01/2024 12:21 PM EST RP Workstation: [id number]     Procedures   Medications Ordered in the ED  sodium chloride  flush (NS) 0.9 % injection 3 mL (has no administration in time range)  sodium chloride  flush (NS) 0.9 % injection 3 mL (has no administration in time range)  0.9 %  sodium chloride  infusion (has no administration in time range)  acetaminophen  (TYLENOL ) tablet 650 mg (has no administration in time range)  ondansetron  (ZOFRAN ) injection 4 mg (has no administration in time range)  apixaban  (ELIQUIS ) tablet 5 mg (has no administration in time range)  atorvastatin  (LIPITOR) tablet 80 mg (has no administration in time range)  PARoxetine  (PAXIL ) tablet 30 mg (has no administration in time range)  levothyroxine  (SYNTHROID ) tablet 25 mcg (has no administration in time range)  digoxin  (LANOXIN ) tablet 0.125 mg (has no administration in time range)  furosemide  (LASIX ) injection 40  mg (has no administration in time range)                                    Medical Decision Making Amount and/or Complexity of Data Reviewed Labs: ordered.    Details: Normal potassium Radiology: ordered and independent interpretation performed.    Details: No obvious CHF ECG/medicine tests: independent interpretation performed.    Details: A-fib  Risk Decision regarding hospitalization.   Patient is in A-fib but otherwise stable.  Stable blood pressure and heart rate.  He does not appear in distress.  Cardiology has seen and will admit for further A-fib and CHF management.     Final diagnoses:  Atrial fibrillation, unspecified  type [facility])    ED Discharge Orders     None          Freddi Hamilton, MD 04/01/24 [phone]

## 2024-04-01 NOTE — H&P (Addendum)
 Advanced Heart Failure Team History and Physical Note   PCP:  [name], MD  PCP-Cardiology: Dr. Cherrie  Reason for Admission: Atrial Fibrillation HPI:    Gregory [name] is a 63 y.o.  male with h/o obesity, metastatic R renal cell CA s/p R nephrectomy in 9/17 with the subsequent discovery of 2 pulmonary nodules whom we follow for non-ischemic cardiomyopathy.   Developed microscopic hematuria and found to have large right renal mass c/w renal cell CA. Eventually underwent right radical nephrectomy with adrenal sparing on 02/07/16 at [facility]. Post-op developed bradycardia with HRs in 30s while sleeping. BP stable. When awakened had dizziness so given atropine . ECG revealed sinus brady in 40s with mild QT prolongation which resolved. Echo done and EF 45% with global HK. RV dilated  With normal function .   We saw him for the first time in October 2017. At that time we ordered cMRI and stress test. However in the interim he went back to [facility]. Found to have 2 metastatic lung nodules Echo showed EF at 46%. However MUGA with EF 28% so referred back to us  for further evaluation prior to chemo. Underwent cath 04/09/16 as below. Had chemo and lung nodule resection.    1. 1v CAD with 75-85% napkin-ring like lesion in the midsection of a small to moderate-sized co-dominant RCA. Otherwise normal coronaries. Nonischemic, dilated cardiomyopathy with EF 30-35% and global hypokinesis   EF 12/21 EF 30% RV normal  Echo 7/21 EF 20-25%. RV normal cMRI 1/18 EF 28% No LGE cMRI 2/22 EF 20% RV 20% No LGE    Had sleep study which was negative.    Echo 3/21 EF 10-15% Mild RV dysfunction. Underwent aggressive med titration with help of PharmDs   Echo 2/24: EF 20-25%. RV ok    cMRI 9/24 1. Moderately dilated LV with diffuse hypokinesis, EF 21%. 2. Normal RV szie with EF 32%. 3. No myocardial LGE, so no definitive evidence for prior MI, infiltrative disease, or myocarditis. 4. Mildly  increased extracellular volume percentage, not in cardiac amyloidosis range.   Underwent MDT ICD 12/24.   Found to be in Afib with RVR at visit with EP a couple of weeks ago. Felt terrible. Seen in our clinic 03/19/24 and set up for DCCV the same day. SR restored. He was volume overloaded and diuretics were increased.   Presented to [facility] for DCCV follow up. Optivol with rising volume and feeling much worse since a few days after his cardioversion. Has been having shortness of breath, fatigue, and orthopnea. He was sent to ED for evaluation. Will admit to observation and consult EP, as had discussed previous medical and procedural approaches to his persisten atrial fibrillation.  Home Medications Prior to Admission medications   Medication Sig Start Date End Date Taking? Authorizing Provider  apixaban  (ELIQUIS ) 5 MG TABS tablet Take 1 tablet (5 mg total) by mouth 2 (two) times daily. 10/07/23   Waddell Danelle ORN, MD  atorvastatin  (LIPITOR) 80 MG tablet Take 1 tablet (80 mg total) by mouth daily. 01/21/24   Maleik Vanderzee, Toribio SAUNDERS, MD  carvedilol  (COREG ) 25 MG tablet TAKE 1 TABLET(25 MG) BY MOUTH TWICE DAILY WITH A MEAL 04/22/23   Lizza Huffaker, Toribio SAUNDERS, MD  cetirizine (ZYRTEC) 10 MG tablet Take 10 mg by mouth daily.    [provider]  Cholecalciferol  (VITAMIN D ) 50 MCG (2000 UT) CAPS Take 2,000 Units by mouth in the morning and at bedtime.    [provider]  digoxin  (LANOXIN ) 0.125 MG tablet TAKE 1 TABLET(125 MCG) BY MOUTH DAILY 04/22/23   Maxamilian Amadon, Toribio SAUNDERS, MD  ENTRESTO  97-103 MG TAKE 1 TABLET BY MOUTH TWICE DAILY 04/22/23   Feliberto Stockley, Toribio SAUNDERS, MD  FIASP  FLEXTOUCH 100 UNIT/ML FlexTouch Pen Inject 0-24 Units into the skin in the morning, at noon, and at bedtime. Dose per sliding scale 01/08/23   [provider]  fluticasone  (CUTIVATE ) 0.05 % cream Apply 1 Application topically daily as needed (irritation). 06/15/20   [provider]  furosemide  (LASIX ) 40 MG  tablet TAKE 1 TABLET BY MOUTH THREE TIMES A WEEK, ON MONDAY, WEDNESDAY, AND FRIDAY. MAY TAKE ADDITIONAL TABLETS AS NEEDED. 08/26/23   Dung Prien, Toribio SAUNDERS, MD  levothyroxine  (SYNTHROID , LEVOTHROID) 25 MCG tablet TAKE 1 TABLET ONCE DAILY BEFORE BREAKFAST 06/17/17   Curcio, Kristin R, NP  metFORMIN  (GLUCOPHAGE -XR) 500 MG 24 hr tablet Take 500 mg by mouth daily.    [provider]  PARoxetine  (PAXIL ) 30 MG tablet Take 1 tablet (30 mg total) by mouth daily. 03/17/24   [name], MD  spironolactone  (ALDACTONE ) 25 MG tablet TAKE 1/2 TABLET(12.5 MG) BY MOUTH DAILY 07/21/23   Bentlee Drier, Toribio SAUNDERS, MD  thiamine  (VITAMIN B1) 100 MG tablet TAKE 1 TABLET(100 MG) BY MOUTH EVERY OTHER DAY 04/24/22   [name], MD  tirzepatide  (MOUNJARO ) 15 MG/0.5ML Pen Inject 15 mg into the skin once a week. 08/12/22   [name], MD  TRESIBA FLEXTOUCH 100 UNIT/ML FlexTouch Pen Inject 14 Units into the skin at bedtime.    [provider]    Past Medical History: Past Medical History:  Diagnosis Date   Allergy    Anemia    low iron   Anxiety    Atrial fib/flutter, transient (HCC)    Cancer (HCC)    Renal cell cancer   CHF (congestive heart failure) (HCC)    Complex tear of lateral meniscus of right knee as current injury 03/20/2017   Complex tear of medial meniscus of right knee 03/20/2017   Diabetes mellitus without complication (HCC)    History of gastrointestinal hemorrhage    Hypertension    Hypertriglyceridemia    Hypothyroidism    Macular degeneration    lasar surgery- Dr Alvia   Neuropathy    Nonischemic cardiomyopathy (HCC)    EF 25-30% by echo 09/12/16   Plantar warts    PONV (postoperative nausea and vomiting)     a little bit of nausea   Primary localized osteoarthritis of right knee 03/20/2017   Renal cell carcinoma (HCC)    s/p nephrectomy 02/07/16 at [facility])    Past Surgical History: Past Surgical History:  Procedure Laterality Date   CARDIAC  CATHETERIZATION N/A 04/19/2016   Procedure: Left Heart Cath and Coronary Angiography;  Surgeon: Toribio SAUNDERS Fuel, MD;  Location: [facility] INVASIVE CV LAB;  Service: Cardiovascular;  Laterality: N/A;   CARDIOVERSION N/A 03/19/2024   Procedure: CARDIOVERSION;  Surgeon: Fuel Toribio SAUNDERS, MD;  Location: [facility] INVASIVE CV LAB;  Service: Cardiovascular;  Laterality: N/A;   CHONDROPLASTY Right 03/20/2017   Procedure: CHONDROPLASTY;  Surgeon: Josefina Chew, MD;  Location: [facility] OR;  Service: Orthopedics;  Laterality: Right;   COLONOSCOPY  03/22/2005   EYE SURGERY Right    laser surgery, right eye macular degeneration   ICD IMPLANT N/A 05/05/2023   Procedure: ICD IMPLANT;  Surgeon: Fernande Elspeth BROCKS, MD;  Location[facility] INVASIVE CV LAB;  Service: Cardiovascular;  Laterality: N/A;   KNEE  ARTHROSCOPY Right    x 2   KNEE ARTHROSCOPY WITH MEDIAL MENISECTOMY Right 03/20/2017   Procedure: KNEE ARTHROSCOPY WITH MEDIAL AND LATERAL MENISECTOMY;  Surgeon: Josefina Chew, MD;  Location: [facility] OR;  Service: Orthopedics;  Laterality: Right;   NEPHRECTOMY Right    orif fx fibula     REVERSE SHOULDER ARTHROPLASTY Left 06/20/2023   Procedure: REVERSE SHOULDER ARTHROPLASTY REVISION;  Surgeon: [name] Kemps, MD;  Location: [facility] ORS;  Service: Orthopedics;  Laterality: Left;   sebaceous cyst excision  1985   TONSILLECTOMY     TOTAL SHOULDER ARTHROPLASTY Left 11/11/2014   Procedure: LEFT TOTAL SHOULDER ARTHROPLASTY;  Surgeon: Kemps [name], MD;  [facility];  Service: Orthopedics;  Laterality: Left;   TOTAL SHOULDER REVISION Left 06/06/2023   Procedure: SHOULDER ARTHROPLASTY REVISION;again FEB 2025   Surgeon: [name] Kemps, MD;  Location: [facility] ORS;  Service: Orthopedics;  Laterality: Left;  with interscalene block   VASECTOMY      Family History:  Family History  Problem Relation Age of Onset   Hypertension Mother    Bipolar disorder Mother    Cancer Mother 40       Brain   Other Father        brain tumor   Colon cancer  Neg Hx    Colon polyps Neg Hx    Rectal cancer Neg Hx    Stomach cancer Neg Hx    Early death Neg Hx    Hyperlipidemia Neg Hx    Kidney disease Neg Hx     Social History: Social History   Socioeconomic History   Marital status: Married    Spouse name: Not on file   Number of children: Not on file   Years of education: Not on file   Highest education level: Bachelor's degree (e.g., BA, AB, BS)  Occupational History   Not on file  Tobacco Use   Smoking status: Never   Smokeless tobacco: Never  Vaping Use   Vaping status: Never Used  Substance and Sexual Activity   Alcohol use: Yes    Alcohol/week: 0.0 standard drinks of alcohol    Comment: social   Drug use: No   Sexual activity: Not on file  Other Topics Concern   Not on file  Social History Narrative   Married ' 90   2 sons - '96 '02; 1 daughter  '98   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    Social Drivers of Corporate Investment Banker Strain: Low Risk  (03/16/2024)   Overall Financial Resource Strain (CARDIA)    Difficulty of Paying Living Expenses: Not hard at all  Food Insecurity: No Food Insecurity (03/16/2024)   Hunger Vital Sign    Worried About Running Out of Food in the Last Year: Never true    Ran Out of Food in the Last Year: Never true  Transportation Needs: No Transportation Needs (03/16/2024)   PRAPARE - Administrator, Civil Service (Medical): No    Lack of Transportation (Non-Medical): No  Physical Activity: Inactive (03/16/2024)   Exercise Vital Sign    Days of Exercise per Week: 0 days    Minutes of Exercise per Session: Not on file  Stress: Stress Concern Present (03/16/2024)   [employer] of Occupational Health - Occupational Stress Questionnaire    Feeling of Stress: To some extent  Social Connections: Moderately Integrated (03/16/2024)   Social Connection and  Isolation Panel    Frequency  of Communication with Friends and Family: More than three times a week    Frequency of Social Gatherings with Friends and Family: Twice a week    Attends Religious Services: 1 to 4 times per year    Active Member of [employer] or Organizations: No    Attends Engineer, Structural: Not on file    Marital Status: Married    Allergies:  No Known Allergies  Objective:    Vital Signs:   Temp:  [98.6 F (37 C)] 98.6 F (37 C) (11/06 1024) Pulse Rate:  [46-99] 46 (11/06 1200) Resp:  [11-18] 16 (11/06 1200) BP: (109-131)/(89-95) 116/92 (11/06 1200) SpO2:  [95 %-97 %] 97 % (11/06 1200) Weight:  [881 kg] 118 kg (11/06 1148)   Filed Weights   04/01/24 1148  Weight: 118 kg   Physical Exam     General: Elderly appearing. No distress on RA Cardiac: JVP ~8cm. S1 and S2 present. No murmurs Resp: Lung sounds clear and equal B/L Extremities: Warm and dry.  Trace BLE edema.  Neuro: Alert and oriented x3. Affect pleasant.   Telemetry   AF 110-120s (personally reviewed)  Labs    Basic Metabolic Panel: Recent Labs  Lab 04/01/24 1133  NA 139  K 4.0  CL 104  CO2 25  GLUCOSE 202*  BUN 27*  CREATININE 1.22  CALCIUM  8.8*  MG 2.1   CBC: Recent Labs  Lab 04/01/24 1133  WBC 6.7  HGB 13.7  HCT 42.3  MCV 88.3  PLT 234   ProBNP (last 3 results) Recent Labs    06/03/23 1527 03/17/24 1433  PROBNP 414.0* 1,502.0*   Assessment/Plan   1. Chronic systolic HF due to NICM  - Unclear etiology. EF 30-35% by cath with NICM. cMRI 1/18 with EF 28%.  - Etiology remains unclear - possible due to immuno-therapy. No evidence of scar of infiltrative process on MRI.  - Echo 4/18 EF 35-40%. Echo 04/2017 EF 50-55% - Echo 3/21 EF 10-15% Mild RV - Echo 7/21 EF 20-25%. RV normal.  - Echo 12/21 EF 30% Personally reviewed - cMRI 2/22 EF 20% RV 20% No LGE - Echo 11/22 EF 25-30% RV ok   - Echo 2/24: EF 20-25%. RV ok  - cMRI 9/24 EF 21% RV EF 32% No LGE - s/p MDT 12/24 - NYHA IIIb. Volume  up on exam and by OptiVol. In setting of AF. - Volume up mildly by exam and significantly by Optivol. Start IV Lasix  80 mg IV bid.  - Reduce coreg  to 12.5 mg BID with acute exacerbation - Reduce entresto  49/51 mg bid - Continue spiro 12.5 mg daily. - Continue digoxin  0.125 mg daily - No SGLT2i with Type I DM - Genetic evaluation was negative - Would repeat echo once SR restored   2. Paroxysmal atrial fibrillation - recurrence 10/31 - S/p DCCV to SR 03/19/24 - Discussed with EP via phone Lanie Passey, PA-C). Family hesitant to consider amiodarone, his brother in law had terrible side effects related to the medication. Patient is open to Tikosyn loading and/or ablation.  - EP to see - Continue Eliquis  5 mg BID   3. Metastatic Renal Cell CA with lung nodules - s/p nephrectomy.  - Follows at [facility].  - Felt to be in remission > 8 years    4. HTN  - BP well controlled - Continue current regimen.   5. CAD - 1v in small RCA.  -  No s/s angina - No AC with Eliquis  - Continue atorva 80   6. 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 - Followed by Dr. Faythe at [place].   7. Obesity - Body mass index is 33.41 kg/m. - He is on GLP1   Jordan Lee, NP 04/01/2024, 1:05 PM  Advanced Heart Failure Team Pager [phone] (M-F; 7a - 5p)  Please contact [facility] for night-coverage after hours (4p -7a ) and weekends on [email or web address]  Patient seen and examined with the above-signed Advanced Practice Provider and/or Housestaff. I personally reviewed laboratory data, imaging studies and relevant notes. I independently examined the patient and formulated the important aspects of the plan. I have edited the note to reflect any of my changes or salient points. I have personally discussed the plan with the patient and/or family.  63 y/o man as above with systolic HF due to NICM. Recently struggling with AF. Underwent DC-CV 03/19/24 but now back in AF with mild HF  flare  Symptoms improved with diuresis.  Has concerns about using amio for AF  General:  Sitting up in bed. No resp difficulty HEENT: normal Neck: supple. nJVP 7-8 Cor: irregular rate & rhythm. No rubs, gallops or murmurs. Lungs: clear Abdomen: soft, nontender, nondistended.Good bowel sounds. Extremities: no cyanosis, clubbing, rash, edema Neuro: alert & orientedx3, cranial nerves grossly intact. moves all 4 extremities w/o difficulty. Affect pleasant  He has recurrent symptomatic AF. Has been seen by EP with plan for amio load with attempted repeat DC-CV tomorrow followed by AF ablation in next few weeks. I agree witht hat plan.   Will continue IV diuresis for now. Would attempt to reset Optivol.   Toribio Fuel, MD  6:25 PM

## 2024-04-01 NOTE — ED Notes (Signed)
 Patient transported to X-ray

## 2024-04-01 NOTE — Consult Note (Addendum)
 ELECTROPHYSIOLOGY CONSULT NOTE    Patient ID: Gregory [name] MRN: [id number], DOB/AGE: [birth year] 63 y.o.  Admit date: 04/01/2024 Date of Consult: 04/01/2024  Primary Physician: [name], MD Primary Cardiologist: None  Electrophysiologist: Dr. Kennyth, though has not met  Referring Provider: @ATTENDING @  Patient Profile: Gregory [name] is a 63 y.o. male with a history of persis AFib, CAD, HFrEF, NICm s/p ICD, metastatic renal cell CA (in remission) s/p R nephrectomy (2017) and subsequent pulm nodule (immunotherapy, resection), HTN, T1DM, hypothyroid, obesity, who is being seen today for the evaluation of Afib at the request of Dr. Bensimhon.  HPI:  Gregory [name] is a 63 y.o. male with PMH as above who is well-known to EP team.   AF initially diagnosed on ICD 09/2023.  He last saw my colleague PA Tillery 10/23 in clinic for discussion of AFib mgmt. They discussed tikosyn vs amiodarone vs ablation vs DCCV. Patient requested DCCV, which was completed 10/24. He was seen earlier today in HF clinic and found to be back in Afib RVR, volume up with elevated Optivol.   EP evaluated for discussion of Afib mgmt.   During my discussion with patient he takes lasix  PRN, but does not have a reliable way to know when/if he should take it. This week he has taken it 3 times. He denies cardiac awareness, no palpitations, chest pain, chest pressure. No increased edema or SOB. He does endorse mild fatigue with decreased exercise tolerance that seems to correlate with AFib, but not always.   He remains hesitant to initiate amiodarone given brother in law's experience with the medication causing thyroid  dysfunction.   He did not take any AM medications this morning, including eliquis .     Labs Potassium4.0 (11/06 1133) Magnesium  2.1 (11/06 1133) Creatinine, ser  1.22 (11/06 1133) PLT  234 (11/06 1133) HGB  13.7 (11/06 1133) WBC 6.7 (11/06 1133)  .   BNP - > 1700  Past Medical History:   Diagnosis Date   Allergy    Anemia    low iron   Anxiety    Atrial fib/flutter, transient (HCC)    Cancer (HCC)    Renal cell cancer   CHF (congestive heart failure) (HCC)    Complex tear of lateral meniscus of right knee as current injury 03/20/2017   Complex tear of medial meniscus of right knee 03/20/2017   Diabetes mellitus without complication (HCC)    History of gastrointestinal hemorrhage    Hypertension    Hypertriglyceridemia    Hypothyroidism    Macular degeneration    lasar surgery- Dr Alvia   Neuropathy    Nonischemic cardiomyopathy (HCC)    EF 25-30% by echo 09/12/16   Plantar warts    PONV (postoperative nausea and vomiting)     a little bit of nausea   Primary localized osteoarthritis of right knee 03/20/2017   Renal cell carcinoma (HCC)    s/p nephrectomy 02/07/16 at [facility])     Surgical History:  Past Surgical History:  Procedure Laterality Date   CARDIAC CATHETERIZATION N/A 04/19/2016   Procedure: Left Heart Cath and Coronary Angiography;  Surgeon: Toribio JONELLE Fuel, MD;  Location: [facility] INVASIVE CV LAB;  Service: Cardiovascular;  Laterality: N/A;   CARDIOVERSION N/A 03/19/2024   Procedure: CARDIOVERSION;  Surgeon: Fuel Toribio JONELLE, MD;  Location: [facility] INVASIVE CV LAB;  Service: Cardiovascular;  Laterality: N/A;   CHONDROPLASTY Right 03/20/2017   Procedure: CHONDROPLASTY;  Surgeon: Josefina Chew, MD;  Location:  [facility] OR;  Service: Orthopedics;  Laterality: Right;   COLONOSCOPY  03/22/2005   EYE SURGERY Right    laser surgery, right eye macular degeneration   ICD IMPLANT N/A 05/05/2023   Procedure: ICD IMPLANT;  Surgeon: Fernande Elspeth BROCKS, MD;  Location: [facility] INVASIVE CV LAB;  Service: Cardiovascular;  Laterality: N/A;   KNEE ARTHROSCOPY Right    x 2   KNEE ARTHROSCOPY WITH MEDIAL MENISECTOMY Right 03/20/2017   Procedure: KNEE ARTHROSCOPY WITH MEDIAL AND LATERAL MENISECTOMY;  Surgeon: Josefina Chew, MD;  Location: [facility] OR;  Service: Orthopedics;   Laterality: Right;   NEPHRECTOMY Right    orif fx fibula     REVERSE SHOULDER ARTHROPLASTY Left 06/20/2023   Procedure: REVERSE SHOULDER ARTHROPLASTY REVISION;  Surgeon: [name] Kemps, MD;  Location: [facility] ORS;  Service: Orthopedics;  Laterality: Left;   sebaceous cyst excision  1985   TONSILLECTOMY     TOTAL SHOULDER ARTHROPLASTY Left 11/11/2014   Procedure: LEFT TOTAL SHOULDER ARTHROPLASTY;  Surgeon: Kemps [name], MD;  Location: [facility] OR;  Service: Orthopedics;  Laterality: Left;   TOTAL SHOULDER REVISION Left 06/06/2023   Procedure: SHOULDER ARTHROPLASTY REVISION;again FEB 2025   Surgeon: [name] Kemps, MD;  Location: [facility] ORS;  Service: Orthopedics;  Laterality: Left;  with interscalene block   VASECTOMY       (Not in a hospital admission)   Inpatient Medications:   apixaban   5 mg Oral BID   [START ON 04/02/2024] atorvastatin   80 mg Oral Daily   carvedilol   12.5 mg Oral BID WC   digoxin   0.125 mg Oral Daily   furosemide   80 mg Intravenous BID   [START ON 04/02/2024] levothyroxine   25 mcg Oral Daily   [START ON 04/02/2024] PARoxetine   30 mg Oral Daily   sacubitril -valsartan   1 tablet Oral BID   sodium chloride  flush  3 mL Intravenous Q12H    Allergies: No Known Allergies  Family History  Problem Relation Age of Onset   Hypertension Mother    Bipolar disorder Mother    Cancer Mother 45       Brain   Other Father        brain tumor   Colon cancer Neg Hx    Colon polyps Neg Hx    Rectal cancer Neg Hx    Stomach cancer Neg Hx    Early death Neg Hx    Hyperlipidemia Neg Hx    Kidney disease Neg Hx      Physical Exam: Vitals:   04/01/24 1125 04/01/24 1130 04/01/24 1148 04/01/24 1200  BP: 121/89 (!) 109/93  (!) 116/92  Pulse: 94 99  (!) 46  Resp: 11 18  16   Temp:      SpO2: 96% 97%  97%  Weight:   118 kg   Height:   6' 2 (1.88 m)     GEN- NAD, A&O x 3, normal affect HEENT: Normocephalic, atraumatic Lungs- CTAB, Normal effort.  Heart- irregular rate and rhythm, No  M/G/R.  GI- Soft, NT, ND.  Extremities- No clubbing, cyanosis, or edema   Radiology/Studies: DG Chest 2 View Result Date: 04/01/2024 EXAM: 2 VIEW(S) XRAY OF THE CHEST 04/01/2024 11:13:00 AM COMPARISON: Chest radiograph dated 05/05/2023 and 07/26/2019. CLINICAL HISTORY: SOB FINDINGS: LUNGS AND PLEURA: Similar left basilar scarring. No appreciable acute consolidation. No definite pleural effusion or pneumothorax. HEART AND MEDIASTINUM: Stable cardiomegaly. Single lead left-sided defibrillator. BONES AND SOFT TISSUES: Partially visualized left reverse shoulder arthroplasty. No acute osseous abnormality. IMPRESSION: 1. No acute cardiopulmonary  findings. 2. Stable cardiomegaly. Electronically signed by: Harrietta Sherry MD 04/01/2024 12:21 PM EST RP Workstation: [id number]   EP STUDY Result Date: 03/19/2024 See surgical note for result.   EKG: 04/01/2024 at 1035 - AFib with occasional PVCs, rate 102; RBBB, LAD  03/19/2024 at 1238 - SR at 93. RBBB, LAD; QTC corrected for RBBB = (personally reviewed)  TELEMETRY: Afib w occasional PVCs (personally reviewed)  DEVICE HISTORY:  MDT single chamber ICD Battery 13y Lead measurement stable <1 % VP Presents in AFib Brief NSVT noted, < 10bts Optivol elevated    Assessment/Plan: #) persis AFib AFib initially diagnosed 09/2023 via ICD He is s/p DCCV 10/23 with return to AFib. He was seen earlier today in HF clinic with concerns for fluid overload exacerbated by AFib w RVR AAD options limited to amiodarone vs tikosyn with reduced LVEF and CAD.  QTC borderline to initiate tikosyn and Jebadiah Imperato be IV diuresing throughout hospitalization, so do not favor initiating Patient very hesitant to initiate amiodarone, but is agreeable to use amio as bridge to AF ablation Start IV amidarone bolus followed by infusion [facility] tentatively schedule DCCV tomorrow  He was delayed taking this AM's eliquis , but just took dose ~345pm Continue 5mg  eliquis  BID for stroke  ppx  #) HFrEF Appears euvolemic on exam though BNP elevated to 1700 with optivol elevated Diuresis per HF team    #) CAD No ischemic s/s currenlty Continue statin     For questions or updates, please contact [facility] Please consult [email or web address] for contact info under Cardiology/STEMI.  Signed, Suzann Riddle, NP  04/01/2024 1:47 PM     Zachary JULIANNA Bickers was seen by me today along with Suzann Riddle. I have personally performed an evaluation on this patient.  My findings are as follows: 63 y.o. male patient with a past history as above.  He has a history of heart failure and atrial fibrillation.  He presented to heart failure clinic and was noted to be in atrial fibrillation.  He was admitted to the hospital.  He has some fatigue and shortness of breath, but otherwise is without complaint.  He has no awareness of palpitations, chest pain or pressure..  Data: EKG(s) and pertinent labs, studies, etc were personally reviewed and interpreted by me:  EKG, telemetry Otherwise, I agree with data as outlined by the advanced practice provider.  Exam performed by me: Gen: No acute distress Neck: No JVD Cardiac: Irregular Lungs: Normal work of breathing Extremities: No edema  My Assessment and Plan:  1.  Persistent atrial fibrillation: Post Medtronic ICD.  He has had an increased burden of atrial fibrillation.  He would benefit from rhythm control.  We discussed methods of rhythm control including medications versus ablation.  At this point, he would prefer to avoid long-term medications.  Zacharia Sowles plan for ablation.  Evoleht Hovatter schedule as an outpatient.  In the interim, we Loxley Cibrian bridge with amiodarone.  Alaiza Yau start IV amiodarone today and plan for cardioversion tomorrow.  2.  Chronic systolic heart failure: Appears euvolemic on exam.  OptiVol and BNP elevated.  Diuresis per heart failure team   Signed,  Gitel Beste Gladis Norton, MD  04/01/2024 9:07 PM

## 2024-04-01 NOTE — Progress Notes (Signed)
 Advanced Heart Failure Clinic   PCP:  [name], MD  HF Cardiologist: Dr. Cherrie  HPI: Gregory [name] is a 63 y.o. male with h/o obesity, metastatic R renal cell CA s/p R nephrectomy in 9/17 with the subsequent discovery of 2 pulmonary nodules whom we follow for non-ischemic cardiomyopathy.   Developed microscopic hematuria and found to have large right renal mass c/w renal cell CA. Eventually underwent right radical nephrectomy with adrenal sparing on 02/07/16 at [facility]. Post-op developed bradycardia with HRs in 30s while sleeping. BP stable. When awakened had dizziness so given atropine . ECG revealed sinus brady in 40s with mild QT prolongation which resolved. Echo done and EF 45% with global HK. RV dilated  With normal function .   We saw him for the first time in October 2017. At that time we ordered cMRI and stress test. However in the interim he went back to [facility]. Found to have 2 metastatic lung nodules Echo showed EF at 46%. However MUGA with EF 28% so referred back to us  for further evaluation prior to chemo. Underwent cath 04/09/16 as below. Had chemo and lung nodule resection.    1. 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 Otherwise normal coronaries 3. Nonischemic, dilated cardiomyopathy with EF 30-35% and global hypokinesis   EF 12/21 EF 30% RV normal  Echo 12/13/19 EF 20-25%. RV normal  cMRI 05/29/16 EF 28% No LGE cMRI 2/22 EF 20% RV 20% No LGE    Had sleep study which was negative.    Echo 07/29/19 EF 10-15% Mild RV dysfunction. Underwent aggressive med titration with help of PharmDs  Echo 07/10/22: EF 20-25%. RV ok   cMRI 9/24 1. Moderately dilated LV with diffuse hypokinesis, EF 21%. 2. Normal RV szie with EF 32%. 3. No myocardial LGE, so no definitive evidence for prior MI, infiltrative disease, or myocarditis. 4. Mildly increased extracellular volume percentage, not in  cardiac amyloidosis range.  Underwent MDT ICD 12/24.  Found to be in Afib with RVR at visit with EP a couple of weeks ago. Felt terrible. Seen in our clinic 10/24 and set up for DCCV the same day. SR restored. He was volume overloaded and diuretics were increased.  He is here today for 2 week follow-up.  Initially felt better post cardioversion. His wife notes he seems worse again for at least the last few days. Notes shortness of breath with exertion, fatigue and orthopnea. No dyspnea at rest. His weight is down 4 lb from last visit.   Past Medical History:  Diagnosis Date   Allergy    Anemia    low iron   Anxiety    Cancer (HCC)    Renal cell cancer   CHF (congestive heart failure) (HCC)    Complex tear of lateral meniscus of right knee as current injury 03/20/2017   Complex tear of medial meniscus of right knee 03/20/2017   Diabetes mellitus without complication (HCC)    History of gastrointestinal hemorrhage    Hypertension    Hypertriglyceridemia    Hypothyroidism    Macular degeneration    lasar surgery- Dr Alvia   Neuropathy    Nonischemic cardiomyopathy (HCC)    EF 25-30% by echo 09/12/16   Plantar warts    PONV (postoperative nausea and vomiting)     a little bit of nausea   Primary localized osteoarthritis of right knee 03/20/2017   Renal cell  carcinoma [facility])    s/p nephrectomy 02/07/16 at [facility])   Past Surgical History:  Procedure Laterality Date   CARDIAC CATHETERIZATION N/A 04/19/2016   Procedure: Left Heart Cath and Coronary Angiography;  Surgeon: Toribio JONELLE Fuel, MD;  Location: [facility] INVASIVE CV LAB;  Service: Cardiovascular;  Laterality: N/A;   CARDIOVERSION N/A 03/19/2024   Procedure: CARDIOVERSION;  Surgeon: Fuel Toribio JONELLE, MD;  Location: [facility] INVASIVE CV LAB;  Service: Cardiovascular;  Laterality: N/A;   CHONDROPLASTY Right 03/20/2017   Procedure: CHONDROPLASTY;  Surgeon: Josefina Chew, MD;  Location: [facility] OR;  Service: Orthopedics;   Laterality: Right;   COLONOSCOPY  03/22/2005   EYE SURGERY Right    laser surgery, right eye macular degeneration   ICD IMPLANT N/A 05/05/2023   Procedure: ICD IMPLANT;  Surgeon: Fernande Elspeth BROCKS, MD;  Location: [facility] INVASIVE CV LAB;  Service: Cardiovascular;  Laterality: N/A;   KNEE ARTHROSCOPY Right    x 2   KNEE ARTHROSCOPY WITH MEDIAL MENISECTOMY Right 03/20/2017   Procedure: KNEE ARTHROSCOPY WITH MEDIAL AND LATERAL MENISECTOMY;  Surgeon: Josefina Chew, MD;  Location: [facility] OR;  Service: Orthopedics;  Laterality: Right;   NEPHRECTOMY Right    orif fx fibula     REVERSE SHOULDER ARTHROPLASTY Left 06/20/2023   Procedure: REVERSE SHOULDER ARTHROPLASTY REVISION;  Surgeon: [name] Kemps, MD;  Location: [facility] ORS;  Service: Orthopedics;  Laterality: Left;   sebaceous cyst excision  1985   TONSILLECTOMY     TOTAL SHOULDER ARTHROPLASTY Left 11/11/2014   Procedure: LEFT TOTAL SHOULDER ARTHROPLASTY;  Surgeon: Kemps [name], MD;  Location: [facility] OR;  Service: Orthopedics;  Laterality: Left;   TOTAL SHOULDER REVISION Left 06/06/2023   Procedure: SHOULDER ARTHROPLASTY REVISION;again FEB 2025   Surgeon: [name] Kemps, MD;  Location: [facility] ORS;  Service: Orthopedics;  Laterality: Left;  with interscalene block   VASECTOMY     Current Outpatient Medications  Medication Sig Dispense Refill   apixaban  (ELIQUIS ) 5 MG TABS tablet Take 1 tablet (5 mg total) by mouth 2 (two) times daily. 180 tablet 3   atorvastatin  (LIPITOR) 80 MG tablet Take 1 tablet (80 mg total) by mouth daily. 90 tablet 3   carvedilol  (COREG ) 25 MG tablet TAKE 1 TABLET(25 MG) BY MOUTH TWICE DAILY WITH A MEAL 180 tablet 3   cetirizine (ZYRTEC) 10 MG tablet Take 10 mg by mouth daily.     Cholecalciferol  (VITAMIN D ) 50 MCG (2000 UT) CAPS Take 2,000 Units by mouth in the morning and at bedtime.     digoxin  (LANOXIN ) 0.125 MG tablet TAKE 1 TABLET(125 MCG) BY MOUTH DAILY 90 tablet 3   ENTRESTO  97-103 MG TAKE 1 TABLET BY MOUTH TWICE DAILY 180 tablet 3    FIASP  FLEXTOUCH 100 UNIT/ML FlexTouch Pen Inject 0-24 Units into the skin in the morning, at noon, and at bedtime. Dose per sliding scale     fluticasone  (CUTIVATE ) 0.05 % cream Apply 1 Application topically daily as needed (irritation).     furosemide  (LASIX ) 40 MG tablet TAKE 1 TABLET BY MOUTH THREE TIMES A WEEK, ON MONDAY, WEDNESDAY, AND FRIDAY. MAY TAKE ADDITIONAL TABLETS AS NEEDED. 180 tablet 3   levothyroxine  (SYNTHROID , LEVOTHROID) 25 MCG tablet TAKE 1 TABLET ONCE DAILY BEFORE BREAKFAST 30 tablet 0   metFORMIN  (GLUCOPHAGE -XR) 500 MG 24 hr tablet Take 500 mg by mouth daily.     PARoxetine  (PAXIL ) 30 MG tablet Take 1 tablet (30 mg total) by mouth daily. 90 tablet 1   spironolactone  (ALDACTONE ) 25 MG tablet TAKE  1/2 TABLET(12.5 MG) BY MOUTH DAILY 45 tablet 3   thiamine  (VITAMIN B1) 100 MG tablet TAKE 1 TABLET(100 MG) BY MOUTH EVERY OTHER DAY 45 tablet 1   tirzepatide  (MOUNJARO ) 15 MG/0.5ML Pen Inject 15 mg into the skin once a week. 2 mL 0   TRESIBA FLEXTOUCH 100 UNIT/ML FlexTouch Pen Inject 14 Units into the skin at bedtime.     No current facility-administered medications for this encounter.   Allergies:   Patient has no known allergies.   Social History:  The patient  reports that he has never smoked. He has never used smokeless tobacco. He reports current alcohol use. He reports that he does not use drugs.   Family History:  The patient's family history includes Bipolar disorder in his mother; Cancer (age of onset: 55) in his mother; Hypertension in his mother; Other in his father.   ROS:  Please see the history of present illness.   All other systems are personally reviewed and negative.   Wt Readings from Last 3 Encounters:  04/01/24 118 kg (260 lb 3.2 oz)  03/19/24 119.7 kg (264 lb)  03/18/24 120.7 kg (266 lb)   BP 120/84   Pulse (!) 102   Wt 118 kg (260 lb 3.2 oz)   SpO2 96%   BMI 33.41 kg/m   Physical Exam:   General:  No distress. Ambulated into clinic. Appears  fatigued. Neck: JVP ~ 10 cm Cor: Irregular rhythm, tachy. No murmurs. Lungs: clear Abdomen: obese, nondistended Extremities: no edema Neuro: alert & orientedx3. Affect pleasant  ECG: Afib with ventricular rate of 106, PVCs  Device interrogation (personally reviewed): OptiVol has steadily trended up since late September (around onset of afib) and now up to 120, back in Afib since 10/31, no VT/VF  ASSESSMENT AND PLAN: 1. Chronic systolic HF due to NICM  - Unclear etiology. EF 30-35% by cath with NICM. cMRI 1/18 with EF 28%.  - Etiology remains unclear - possible due to immuno-therapy. No evidence of scar of infiltrative process on MRI.  - Echo 4/18 EF 35-40%. Echo 04/2017 EF 50-55% - Echo 07/29/19 EF 10-15% Mild RV - Echo 12/13/19 EF 20-25%. RV normal.  - Echo 12/21 EF 30% Personally reviewed - cMRI 2/22 EF 20% RV 20% No LGE - Echo 04/18/21 EF 25-30% RV ok   - Echo 07/10/22: EF 20-25%. RV ok  - cMRI 9/24 EF 21% RV EF 32% No LGE - s/p MDT 12/24 - NYHA IIIb. Volume up on exam and by OptiVol. In setting of AF. - Discussed with Dr. Zenaida while in clinic. Will send to ED to tune up with IV diuresis and plan rhythm control for Afib. Rounding team made aware of referral to ED. Would start with IV lasix  80 BID. - Would reduce coreg  to 12.5 mg BID with acute exacerbation - Continue Entresto  97/103 mg bid.  - Continue spiro 12.5 mg daily. - Continue digoxin  0.125 mg daily - No SGLT2i with Type I DM - Genetic evaluation was negative - Check CMET, BNP - Would repeat echo once SR restored  2. Paroxysmal atrial fibrillation - recurrence 10/31 - S/p DCCV to SR 03/19/24 - Discussed with EP via phone Lanie Passey, PA-C). Family hesitant to consider amiodarone, his brother in law had terrible side effects related to the medication. Patient is open to Tikosyn loading and/or ablation. Asked EP to see in ED. - Continue Eliquis  5 mg BID  3. Metastatic Renal Cell CA with lung nodules - s/p  nephrectomy.  -  Follow[facility] at [facility].  - Felt to be in remission > 8 years    4. HTN  - BP well controlled - Continue current regimen.  5. CAD - 1v in small RCA.  - No s/s angina - No AC with Eliquis  - Continue atorva 80   6. 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 - Followed by Dr. Faythe at [place].  7. Obesity - Body mass index is 33.41 kg/m. - He is on GLP1  Follow-up: 2 weeks with APP   Arrington Yohe N, PA-C  04/01/2024 10:35 AM  Advanced [facility]

## 2024-04-01 NOTE — Patient Instructions (Signed)
 PLEASE REPORT THE EMERGENCY ROOM TO BE EVALUATED   Follow-Up in: TO BE DETERMINED AFTER ER EVALUATION   At the [facility], you and your health needs are our priority. We have a designated team specialized in the treatment of Heart Failure. This Care Team includes your primary Heart Failure Specialized Cardiologist (physician), Advanced Practice Providers (APPs- Physician Assistants and Nurse Practitioners), and Pharmacist who all work together to provide you with the care you need, when you need it.   You may see any of the following providers on your designated Care Team at your next follow up:  Dr. Toribio Fuel Dr. Ezra Shuck Dr. Odis Brownie Greig Mosses, NP Caffie Shed, [place] [facility] [place], [place] Beckey Coe, NP Jordan Lee, NP Tinnie Redman, PharmD   Please be sure to bring in all your medications bottles to every appointment.   Need to Contact Us :  If you have any questions or concerns before your next appointment please send us  a message through [place] or call our office at [phone].    TO LEAVE A MESSAGE FOR THE NURSE SELECT OPTION 2, PLEASE LEAVE A MESSAGE INCLUDING: YOUR NAME DATE OF BIRTH CALL BACK NUMBER REASON FOR CALL**this is important as we prioritize the call backs  YOU WILL RECEIVE A CALL BACK THE SAME DAY AS LONG AS YOU CALL BEFORE 4:00 PM

## 2024-04-02 ENCOUNTER — Other Ambulatory Visit (HOSPITAL_COMMUNITY): Payer: Self-pay | Admitting: Internal Medicine

## 2024-04-02 ENCOUNTER — Encounter (HOSPITAL_COMMUNITY)
Admission: EM | Disposition: A | Discharge status: Home / Self Care | Payer: Self-pay | Source: Home / Self Care | Attending: Internal Medicine

## 2024-04-02 ENCOUNTER — Inpatient Hospital Stay (HOSPITAL_COMMUNITY): Admitting: Anesthesiology

## 2024-04-02 DIAGNOSIS — E039 Hypothyroidism, unspecified: Secondary | ICD-10-CM | POA: Diagnosis present

## 2024-04-02 DIAGNOSIS — Z7984 Long term (current) use of oral hypoglycemic drugs: Secondary | ICD-10-CM | POA: Diagnosis not present

## 2024-04-02 DIAGNOSIS — Z85528 Personal history of other malignant neoplasm of kidney: Secondary | ICD-10-CM | POA: Diagnosis not present

## 2024-04-02 DIAGNOSIS — I4819 Other persistent atrial fibrillation: Secondary | ICD-10-CM | POA: Diagnosis not present

## 2024-04-02 DIAGNOSIS — Z794 Long term (current) use of insulin: Secondary | ICD-10-CM | POA: Diagnosis not present

## 2024-04-02 DIAGNOSIS — I4891 Unspecified atrial fibrillation: Secondary | ICD-10-CM

## 2024-04-02 DIAGNOSIS — I420 Dilated cardiomyopathy: Secondary | ICD-10-CM | POA: Diagnosis present

## 2024-04-02 DIAGNOSIS — C7800 Secondary malignant neoplasm of unspecified lung: Secondary | ICD-10-CM | POA: Diagnosis present

## 2024-04-02 DIAGNOSIS — Z79899 Other long term (current) drug therapy: Secondary | ICD-10-CM | POA: Diagnosis not present

## 2024-04-02 DIAGNOSIS — Z8249 Family history of ischemic heart disease and other diseases of the circulatory system: Secondary | ICD-10-CM | POA: Diagnosis not present

## 2024-04-02 DIAGNOSIS — I429 Cardiomyopathy, unspecified: Secondary | ICD-10-CM | POA: Diagnosis not present

## 2024-04-02 DIAGNOSIS — I2510 Atherosclerotic heart disease of native coronary artery without angina pectoris: Secondary | ICD-10-CM | POA: Diagnosis present

## 2024-04-02 DIAGNOSIS — I5023 Acute on chronic systolic (congestive) heart failure: Secondary | ICD-10-CM | POA: Diagnosis not present

## 2024-04-02 DIAGNOSIS — I5022 Chronic systolic (congestive) heart failure: Secondary | ICD-10-CM | POA: Diagnosis not present

## 2024-04-02 DIAGNOSIS — E669 Obesity, unspecified: Secondary | ICD-10-CM | POA: Diagnosis present

## 2024-04-02 DIAGNOSIS — Z6833 Body mass index (BMI) 33.0-33.9, adult: Secondary | ICD-10-CM | POA: Diagnosis not present

## 2024-04-02 DIAGNOSIS — Z905 Acquired absence of kidney: Secondary | ICD-10-CM | POA: Diagnosis not present

## 2024-04-02 DIAGNOSIS — E1042 Type 1 diabetes mellitus with diabetic polyneuropathy: Secondary | ICD-10-CM | POA: Diagnosis present

## 2024-04-02 DIAGNOSIS — Z96612 Presence of left artificial shoulder joint: Secondary | ICD-10-CM | POA: Diagnosis present

## 2024-04-02 DIAGNOSIS — K219 Gastro-esophageal reflux disease without esophagitis: Secondary | ICD-10-CM | POA: Diagnosis present

## 2024-04-02 DIAGNOSIS — E781 Pure hyperglyceridemia: Secondary | ICD-10-CM | POA: Diagnosis present

## 2024-04-02 DIAGNOSIS — D6869 Other thrombophilia: Secondary | ICD-10-CM | POA: Diagnosis not present

## 2024-04-02 DIAGNOSIS — Z79890 Hormone replacement therapy (postmenopausal): Secondary | ICD-10-CM | POA: Diagnosis not present

## 2024-04-02 DIAGNOSIS — Z95810 Presence of automatic (implantable) cardiac defibrillator: Secondary | ICD-10-CM | POA: Diagnosis not present

## 2024-04-02 DIAGNOSIS — Z7985 Long-term (current) use of injectable non-insulin antidiabetic drugs: Secondary | ICD-10-CM | POA: Diagnosis not present

## 2024-04-02 DIAGNOSIS — Z7901 Long term (current) use of anticoagulants: Secondary | ICD-10-CM | POA: Diagnosis not present

## 2024-04-02 DIAGNOSIS — I110 Hypertensive heart disease with heart failure: Secondary | ICD-10-CM | POA: Diagnosis present

## 2024-04-02 HISTORY — PX: CARDIOVERSION: EP1203

## 2024-04-02 LAB — BASIC METABOLIC PANEL WITH GFR
Anion gap: 12 (ref 5–15)
BUN: 28 mg/dL — ABNORMAL HIGH (ref 8–23)
CO2: 22 mmol/L (ref 22–32)
Calcium: 8.7 mg/dL — ABNORMAL LOW (ref 8.9–10.3)
Chloride: 100 mmol/L (ref 98–111)
Creatinine, Ser: 1.53 mg/dL — ABNORMAL HIGH (ref 0.61–1.24)
GFR, Estimated: 50 mL/min — ABNORMAL LOW
Glucose, Bld: 364 mg/dL — ABNORMAL HIGH (ref 70–99)
Potassium: 3.9 mmol/L (ref 3.5–5.1)
Sodium: 134 mmol/L — ABNORMAL LOW (ref 135–145)

## 2024-04-02 LAB — GLUCOSE, CAPILLARY
Glucose-Capillary: 350 mg/dL — ABNORMAL HIGH (ref 70–99)
Glucose-Capillary: 259 mg/dL — ABNORMAL HIGH (ref 70–99)
Glucose-Capillary: 145 mg/dL — ABNORMAL HIGH (ref 70–99)
Glucose-Capillary: 192 mg/dL — ABNORMAL HIGH (ref 70–99)

## 2024-04-02 LAB — MAGNESIUM: Magnesium: 1.9 mg/dL (ref 1.7–2.4)

## 2024-04-02 LAB — CBC
HCT: 43.4 % (ref 39.0–52.0)
Hemoglobin: 14.1 g/dL (ref 13.0–17.0)
MCH: 28.8 pg (ref 26.0–34.0)
MCHC: 32.5 g/dL (ref 30.0–36.0)
MCV: 88.6 fL (ref 80.0–100.0)
Platelets: 225 K/uL (ref 150–400)
RBC: 4.9 MIL/uL (ref 4.22–5.81)
RDW: 12.8 % (ref 11.5–15.5)
WBC: 8.7 K/uL (ref 4.0–10.5)
nRBC: 0 % (ref 0.0–0.2)

## 2024-04-02 SURGERY — CARDIOVERSION (CATH LAB)
Anesthesia: General

## 2024-04-02 MED ORDER — AMIODARONE HCL 200 MG PO TABS: 400.0000 mg | ORAL_TABLET | Freq: Two times a day (BID) | ORAL | Status: DC

## 2024-04-02 MED ORDER — AMIODARONE HCL 200 MG PO TABS: 200.0000 mg | ORAL_TABLET | Freq: Two times a day (BID) | ORAL | Status: DC

## 2024-04-02 MED ORDER — MAGNESIUM SULFATE 2 GM/50ML IV SOLN: 2.0000 g | Freq: Once | INTRAVENOUS | Status: DC

## 2024-04-02 MED ORDER — AMIODARONE HCL 200 MG PO TABS
200.0000 mg | ORAL_TABLET | Freq: Every day | ORAL | Status: DC
Start: 2024-04-17 — End: 2024-04-03

## 2024-04-02 MED ORDER — SPIRONOLACTONE 25 MG PO TABS
25.0000 mg | ORAL_TABLET | Freq: Every day | ORAL | Status: DC
  Administered 2024-04-02 – 2024-04-04 (×3): 25 mg via ORAL
  Filled 2024-04-02 (×3): qty 1

## 2024-04-02 MED ORDER — MAGNESIUM SULFATE 2 GM/50ML IV SOLN
2.0000 g | Freq: Once | INTRAVENOUS | Status: AC
  Administered 2024-04-02: 2 g via INTRAVENOUS
  Filled 2024-04-02: qty 50

## 2024-04-02 MED ORDER — LIDOCAINE HCL (CARDIAC) PF 100 MG/5ML IV SOSY
PREFILLED_SYRINGE | INTRAVENOUS | Status: DC | PRN
  Administered 2024-04-02: 60 mg via INTRAVENOUS

## 2024-04-02 MED ORDER — AMIODARONE HCL 200 MG PO TABS
200.0000 mg | ORAL_TABLET | Freq: Two times a day (BID) | ORAL | Status: DC
  Administered 2024-04-02: 200 mg via ORAL
  Filled 2024-04-02: qty 1

## 2024-04-02 MED ORDER — PHENYLEPHRINE HCL (PRESSORS) 10 MG/ML IV SOLN
INTRAVENOUS | Status: DC | PRN
  Administered 2024-04-02: 80 mg via INTRAVENOUS

## 2024-04-02 MED ORDER — PROPOFOL 10 MG/ML IV BOLUS
INTRAVENOUS | Status: DC | PRN
  Administered 2024-04-02: 60 mg via INTRAVENOUS

## 2024-04-02 SURGICAL SUPPLY — 1 items: PAD DEFIB RADIO PHYSIO CONN (PAD) ×2 IMPLANT

## 2024-04-02 NOTE — Plan of Care (Signed)
   Problem: Education: Goal: Knowledge of General Education information will improve Description Including pain rating scale, medication(s)/side effects and non-pharmacologic comfort measures Outcome: Progressing   Problem: Clinical Measurements: Goal: Will remain free from infection Outcome: Progressing   Problem: Nutrition: Goal: Adequate nutrition will be maintained Outcome: Progressing   Problem: Coping: Goal: Level of anxiety will decrease Outcome: Progressing

## 2024-04-02 NOTE — Interval H&P Note (Signed)
 History and Physical Interval Note:  04/02/2024 2:17 PM  Gregory [name]  has presented today for surgery, with the diagnosis of afib.  The various methods of treatment have been discussed with the patient and family. After consideration of risks, benefits and other options for treatment, the patient has consented to  Procedure(s): CARDIOVERSION (N/A) as a surgical intervention.  The patient's history has been reviewed, patient examined, no change in status, stable for surgery.  I have reviewed the patient's chart and labs.  Questions were answered to the patient's satisfaction.     [name]

## 2024-04-02 NOTE — Progress Notes (Signed)
 Advanced Heart Failure Rounding Note   Subjective:    Remains in AF - rates 100-110. Denies SOB, orthopnea or PND.   Feels ok Decent diuresis on IV lasix .     Objective:   Weight Range:  Vital Signs:   Temp:  [98 F (36.7 C)-98.6 F (37 C)] 98.2 F (36.8 C) (11/07 0745) Pulse Rate:  [43-117] 43 (11/07 0745) Resp:  [11-18] 18 (11/07 0745) BP: (109-131)/(85-99) 112/88 (11/07 0745) SpO2:  [92 %-98 %] 92 % (11/07 0745) Weight:  [115.3 kg-118 kg] 115.3 kg (11/07 0525) Last BM Date : 03/31/24  Weight change: Filed Weights   04/01/24 1148 04/01/24 1420 04/02/24 0525  Weight: 118 kg 115.3 kg 115.3 kg    Intake/Output:   Intake/Output Summary (Last 24 hours) at 04/02/2024 0942 Last data filed at 04/02/2024 0700 Gross per 24 hour  Intake 496.8 ml  Output 2025 ml  Net -1528.2 ml     Physical Exam: General:  Well appearing. No resp difficulty HEENT: normal Neck: supple. JVP flat. Carotids 2+ bilat; no bruits. No lymphadenopathy or thryomegaly appreciated. Cor: irreg tachy Lungs: clear Abdomen: soft, nontender, nondistended. No hepatosplenomegaly. No bruits or masses. Good bowel sounds. Extremities: no cyanosis, clubbing, rash, edema Neuro: alert & orientedx3, cranial nerves grossly intact. moves all 4 extremities w/o difficulty. Affect pleasant  Telemetry: AF 100-110 Personally reviewed   Labs: Basic Metabolic Panel: Recent Labs  Lab 04/01/24 1133 04/02/24 0306  NA 139 134*  K 4.0 3.9  CL 104 100  CO2 25 22  GLUCOSE 202* 364*  BUN 27* 28*  CREATININE 1.22 1.53*  CALCIUM  8.8* 8.7*  MG 2.1 1.9    Liver Function Tests: No results for input(s): AST, ALT, ALKPHOS, BILITOT, PROT, ALBUMIN in the last 168 hours. No results for input(s): LIPASE, AMYLASE in the last 168 hours. No results for input(s): AMMONIA in the last 168 hours.  CBC: Recent Labs  Lab 04/01/24 1133 04/02/24 0306  WBC 6.7 8.7  HGB 13.7 14.1  HCT 42.3 43.4  MCV  88.3 88.6  PLT 234 225    Cardiac Enzymes: No results for input(s): CKTOTAL, CKMB, CKMBINDEX, TROPONINI in the last 168 hours.  BNP: BNP (last 3 results) Recent Labs    04/01/24 1133  BNP 1,721.0*    ProBNP (last 3 results) Recent Labs    06/03/23 1527 03/17/24 1433  PROBNP 414.0* 1,502.0*      Other results:  Imaging: DG Chest 2 View Result Date: 04/01/2024 EXAM: 2 VIEW(S) XRAY OF THE CHEST 04/01/2024 11:13:00 AM COMPARISON: Chest radiograph dated 05/05/2023 and 07/26/2019. CLINICAL HISTORY: SOB FINDINGS: LUNGS AND PLEURA: Similar left basilar scarring. No appreciable acute consolidation. No definite pleural effusion or pneumothorax. HEART AND MEDIASTINUM: Stable cardiomegaly. Single lead left-sided defibrillator. BONES AND SOFT TISSUES: Partially visualized left reverse shoulder arthroplasty. No acute osseous abnormality. IMPRESSION: 1. No acute cardiopulmonary findings. 2. Stable cardiomegaly. Electronically signed by: Shahmeer Lateef MD 04/01/2024 12:21 PM EST RP Workstation: [id number]     Medications:     Scheduled Medications:  apixaban   5 mg Oral BID   atorvastatin   80 mg Oral Daily   carvedilol   12.5 mg Oral BID WC   digoxin   0.125 mg Oral Daily   insulin  aspart  0-20 Units Subcutaneous TID WC   insulin  glargine-yfgn  14 Units Subcutaneous QHS   levothyroxine   25 mcg Oral Daily   PARoxetine   30 mg Oral Daily   sacubitril -valsartan   1 tablet Oral BID   sodium  chloride flush  3 mL Intravenous Q12H   spironolactone   25 mg Oral Daily    Infusions:  sodium chloride      sodium chloride  20 mL/hr at 04/01/24 2208   amiodarone 30 mg/hr (04/02/24 0644)   magnesium sulfate bolus IVPB 2 g (04/02/24 0942)    PRN Medications: sodium chloride , acetaminophen , ondansetron  (ZOFRAN ) IV, sodium chloride  flush   Assessment/plan:    1. Acute on chronic systolic HF due to NICM  - Unclear etiology. EF 30-35% by cath with NICM. cMRI 1/18 with EF 28%.  -  Etiology remains unclear - possible due to immuno-therapy. No evidence of scar of infiltrative process on MRI.  - Echo 4/18 EF 35-40%. Echo 04/2017 EF 50-55% - Echo 3/21 EF 10-15% Mild RV - Echo 7/21 EF 20-25%. RV normal.  - Echo 12/21 EF 30% Personally reviewed - cMRI 2/22 EF 20% RV 20% No LGE - Echo 11/22 EF 25-30% RV ok   - Echo 2/24: EF 20-25%. RV ok  - cMRI 9/24 EF 21% RV EF 32% No LGE - s/p MDT 12/24 - NYHA IIIb. Volume up on exam and by OptiVol. In setting of AF. - Volume improved with IV lasix . Can stop IV lasix  - Will reset Optivol after DC-CV - Reduce coreg  to 12.5 mg BID with acute exacerbation - Reduce entresto  49/51 mg bid with low BP  - Continue spiro 12.5 mg daily. - Continue digoxin  0.125 mg daily - No SGLT2i with Type I DM - Genetic evaluation was negative   2. Paroxysmal atrial fibrillation - recurrence 10/31 - S/p DCCV to SR 03/19/24 - EP following. Loading IV amio now. Plan DC-CV today. With short-term amio load followed by outpatient abaltion - Continue Eliquis  5 mg BID   3. Metastatic Renal Cell CA with lung nodules - s/p nephrectomy.  - Follows at [facility].  - Felt to be in remission > 8 years    4. HTN  - BP running low.  - Changes as above   5. CAD - 1v in small RCA.  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 - Followed by Dr. Faythe at [facility].   7. Obesity - Body mass index is 33.41 kg/m. - He is on GLP1   Likely home after DC-CV today on po amio   Length of Stay: 0   Toribio Fuel MD 04/02/2024, 9:42 AM  Advanced Heart Failure Team Pager [phone] (M-F; 7a - 4p)  Please contact [facility] for night-coverage after hours (4p -7a ) and weekends on [email or web address]

## 2024-04-02 NOTE — Progress Notes (Addendum)
 Patient Name: Gregory [name] Date of Encounter: 04/02/2024  Primary Cardiologist: None Electrophysiologist: Fonda Kitty, MD  Interval Summary   NAEON.  Did not sleep well. Gregory [name] feels like Gregory [name] is dry - has dry mouth.   Remains in afib, NPO for DCCV this afternoon   Vital Signs    Vitals:   04/01/24 2305 04/02/24 0332 04/02/24 0525 04/02/24 0745  BP: 119/87 (!) 128/95  112/88  Pulse: 86 97  (!) 43  Resp: 16   18  Temp: 98.2 F (36.8 C)   98.2 F (36.8 C)  TempSrc: Oral Oral  Oral  SpO2: 98% 98%  92%  Weight:   115.3 kg   Height:        Intake/Output Summary (Last 24 hours) at 04/02/2024 0840 Last data filed at 04/02/2024 0700 Gross per 24 hour  Intake 496.8 ml  Output 2025 ml  Net -1528.2 ml   Filed Weights   04/01/24 1148 04/01/24 1420 04/02/24 0525  Weight: 118 kg 115.3 kg 115.3 kg    Physical Exam    GEN- NAD, Alert and oriented  Lungs- Clear to ausculation bilaterally, normal work of breathing Cardiac- Irregularly irregular rate and rhythm, no murmurs, rubs or gallops GI- soft, NT, ND, + BS Extremities- no clubbing or cyanosis. No edema  Telemetry    AFib w freq PVC vs aberrant beats, rates 100s (personally reviewed)  Hospital Course    Gregory [name] is a 63 y.o. male with PMH of persis AFib, CAD, HFrEF, NICm s/p ICD, metastatic renal cell CA (in remission) s/p R nephrectomy (2017) and subsequent pulm nodule (immunotherapy, resection), HTN, T1DM, hypothyroid, obesity admitted for afib w RVR, CHF exacerbation  Assessment & Plan    #) persis Afib Remains in Afib with rates 90-110s Amio gtt at 30 Planned for DCCV this afternoon, remain on amio drip until after DCCV Consider adjusting to PO after DCCV Continue 5mg  eliquis  BID for stroke ppx Continue coreg  12.5mg  BID (home dose 25 BID) Continue 0.125mg  dig  Gregory [name] is being scheduled for outpatient AF ablation w Dr. Inocencio   #) HFrEF S/p IV lasix  yesterday Patient feels like Gregory [name] is dry today, Cr  elevated Appreciate HF input/mgmt   #) ICD No ventricular arrhythmia       For questions or updates, please contact [facility] Please consult [email or web address] for contact info under     Signed, Gregory Riddle, Gregory [name]  04/02/2024, 8:40 AM     Gregory [name] was seen by me today along with Gregory [name]. I have personally performed an evaluation on this patient.  My findings are as follows: 63 y.o. male patient feeling improved with diuresis.  Remains in atrial fibrillation.  Data: EKG(s) and pertinent labs, studies, etc were personally reviewed and interpreted by me:  Telemetry Otherwise, I agree with data as outlined by the advanced practice provider.  Exam performed by me: Gen: No acute distress Neck: No JVD Cardiac: Irregular, tachycardic Lungs: Normal work of breathing Extremities: No edema  My Assessment and Plan:  1.  Persistent atrial fibrillation: Patient has been on amiodarone.  Gregory [name] plan for 24 hours of amiodarone and then switch to p.o. 400 mg twice daily x 2 weeks followed by 200 mg daily.  Gregory [name] is scheduled for cardioversion today.  We Gregory [name] contact him as an outpatient to schedule atrial fibrillation ablation.  Risks and benefits were discussed yesterday.  2.  Acute on chronic systolic heart failure: Managed by heart failure  Signed,  Gregory Kretchmer Gladis Norton, MD  04/02/2024 1:10 PM

## 2024-04-02 NOTE — Discharge Summary (Addendum)
 Advanced Heart Failure Team  Discharge Summary   Patient ID: Gregory [name] MRN: [id number], DOB/AGE: 64-Dec-1962 63 y.o. Admit date: 04/01/2024 D/C date:     04/04/2024   Primary Discharge Diagnoses:  A/C HFrEF Paroxysmal Atrial Fibrillation  Secondary Discharge Diagnoses:  N/A  Hospital Course:   RICKE [name] is a 63 y.o. male with history of obesity, metastatic R renal cell CA s/p R nephrectomy in 9/17 with the subsequent discovery of 2 pulmonary nodules whom the heart failure service follow for non-ischemic cardiomyopathy.   He was seen in AHF Clinic 04/01/24 for follow up after having cardioversion on 03/19/24. Found to be in symptomatic ERAF. He was admitted for IV diuresis and EP consult. Previously adamant that he did not want to start on amiodarone, due to possible thyroid  side effects. He is now agreeable to using amiodarone as bridge to ablation. He was started on IV amiodarone and taken for successful DCCV on 04/02/24. Of notes, Optivol readings have not been matching with exam, discussed with EP and his Optivol was reset.   The plan is to continue amiodarone 400 mg twice daily for 2 weeks, then 200 mg daily thereafter.  Carvedilol  has been reduced to 12.5 mg twice a day.  He is currently scheduled for outpatient A-fib ablation by Dr. Inocencio.  During this hospitalization, the patient did have a episode of chest discomfort 04/03/2024, EKG showed no acute changes, serial troponin was borderline however remained flat inconsistent with ACS.  Symptom resolved.  Suspect GERD. Seen today by Dr. Kennyth and deemed approprate for discharge with close AHF and EP follow up.  Consider checking dig level on follow-up.   Discharge Weight: 254 lb Discharge Vitals: Blood pressure (!) 125/91, pulse 71, temperature 98.1 F (36.7 C), temperature source Oral, resp. rate 16, height 6' 2 (1.88 m), weight 114.8 kg, SpO2 97%.  Labs: Lab Results  Component Value Date   WBC 6.1 04/04/2024   HGB 12.8 (L)  04/04/2024   HCT 39.2 04/04/2024   MCV 87.5 04/04/2024   PLT 202 04/04/2024    Recent Labs  Lab 04/04/24 0216  NA 138  K 4.3  CL 100  CO2 27  BUN 24*  CREATININE 1.50*  CALCIUM  8.8*  GLUCOSE 239*   Lab Results  Component Value Date   CHOL 152 12/17/2023   HDL 37 12/17/2023   LDLCALC 68 12/17/2023   TRIG 295 (A) 12/17/2023   BNP (last 3 results) Recent Labs    04/01/24 1133  BNP 1,721.0*    ProBNP (last 3 results) Recent Labs    06/03/23 1527 03/17/24 1433  PROBNP 414.0* 1,502.0*     Diagnostic Studies/Procedures   No results found.   Discharge Medications   Allergies as of 04/04/2024   No Known Allergies      Medication List     STOP taking these medications    Entresto  97-103 MG Generic drug: sacubitril -valsartan  Replaced by: sacubitril -valsartan  49-51 MG       TAKE these medications    amiodarone 200 MG tablet Commonly known as: PACERONE Take 2 tablets (400 mg total) by mouth 2 (two) times daily for 14 days, THEN 1 tablet (200 mg total) daily. Start taking on: April 04, 2024   apixaban  5 MG Tabs tablet Commonly known as: ELIQUIS  Take 1 tablet (5 mg total) by mouth 2 (two) times daily.   atorvastatin  80 MG tablet Commonly known as: LIPITOR Take 1 tablet (80 mg total) by mouth daily.   carvedilol   12.5 MG tablet Commonly known as: COREG  Take 1 tablet (12.5 mg total) by mouth 2 (two) times daily with a meal. What changed:  medication strength See the new instructions.   cetirizine 10 MG tablet Commonly known as: ZYRTEC Take 10 mg by mouth daily.   digoxin  0.125 MG tablet Commonly known as: LANOXIN  TAKE 1 TABLET(125 MCG) BY MOUTH DAILY   Fiasp  FlexTouch 100 UNIT/ML FlexTouch Pen Generic drug: insulin  aspart Inject 15-35 Units into the skin 3 (three) times daily before meals. Dose per sliding scale   fluticasone  0.05 % cream Commonly known as: CUTIVATE  Apply 1 Application topically daily as needed (irritation).    furosemide  40 MG tablet Commonly known as: LASIX  TAKE 1 TABLET BY MOUTH THREE TIMES A WEEK, ON MONDAY, WEDNESDAY, AND FRIDAY. MAY TAKE ADDITIONAL TABLETS AS NEEDED.   levothyroxine  25 MCG tablet Commonly known as: SYNTHROID  TAKE 1 TABLET ONCE DAILY BEFORE BREAKFAST What changed: See the new instructions.   metFORMIN  500 MG 24 hr tablet Commonly known as: GLUCOPHAGE -XR Take 500 mg by mouth daily.   Mounjaro  15 MG/0.5ML Pen Generic drug: tirzepatide  Inject 15 mg into the skin once a week. What changed: when to take this   PARoxetine  30 MG tablet Commonly known as: PAXIL  Take 1 tablet (30 mg total) by mouth daily.   sacubitril -valsartan  49-51 MG Commonly known as: ENTRESTO  Take 1 tablet by mouth 2 (two) times daily. Replaces: Entresto  97-103 MG   spironolactone  25 MG tablet Commonly known as: ALDACTONE  Take 1 tablet (25 mg total) by mouth daily. What changed: See the new instructions.   Tresiba FlexTouch 100 UNIT/ML FlexTouch Pen Generic drug: insulin  degludec Inject 14 Units into the skin at bedtime.   VITAMIN D -3 PO Take 1 capsule by mouth in the morning and at bedtime.        Disposition   The patient will be discharged in stable condition to home.   Follow-up Information     [facility] Follow up on 04/13/2024.   Specialty: Cardiology Why: Follow up in the Advanced Heart Failure Clinic 04/13/2024 at 930 am Entrance C Free valet Please bring all medications with you Contact information: [address] [place]  [address] [phone]        Lesia Ozell Barter, PA-C Follow up on 04/06/2024.   Specialty: Cardiology Why: 8:00AM. EP follow up Contact information: [address] [place] [address] [phone]                   [place], [place] 04/04/2024, 8:31 AM  I have seen, examined the patient, and reviewed the above assessment and plan.    Hospital Course:   [name] presented with atrial fibrillation and decompensated heart failure. He was loaded on IV amiodarone and underwent repeat cardioversion which was successful. He received one dose of IV lasix  with robust UOP. Creatinine bumped in this setting. He was monitored until creatinine downtrended. He will be discharged with follow up in HF clinic and plans for outpatient AF ablation.   General: Well developed, in no acute distress.  Neck: No JVD.  Cardiac: Normal rate, regular rhythm.  Resp: Normal work of breathing.  Ext: No edema.  Neuro: No gross focal deficits.  Psych: Normal affect.   Assessment and Plan:  #Persistent atrial fibrillation: S/p DCCV on 11/7.  #Hypercoagulable state due to AF #High risk medication use -Continue amiodarone 400mg  BID for total of 2 weeks, then 200mg  daily thereafter. -Continue  5mg  eliquis  BID. -Continue carvedilol  12.5mg  BID (home dose 25 BID). -Continue digoxin  0.125mg . -He is being scheduled for outpatient AF ablation w Dr. Inocencio.    #Chronic systolic heart failure: Received IV lasix  x 1 with robust UOP. Now feels dry. Associated with bump in Cr. He has 1 kidney. Encouragingly, creatinine peaked yesterday and is now downtrending.  -Continue GDMT regimen of carvedilol , Entresto  49-51mg , spironolactone  25mg  daily.    #S/p ICD -No ventricular arrhythmias.  Duration of discharge encounter - 35 minutes.   Fonda Kitty, MD 04/04/2024 12:30 PM

## 2024-04-02 NOTE — CV Procedure (Signed)
    DIRECT CURRENT CARDIOVERSION  NAME:  Gregory [name]   MRN: [id number] DOB:  1960/07/29   ADMIT DATE: 04/01/2024   INDICATIONS: Atrial fibrillation    PROCEDURE:   Informed consent was obtained prior to the procedure. The risks, benefits and alternatives for the procedure were discussed and the patient comprehended these risks. Once an appropriate time out was taken, the patient had the defibrillator pads placed in the anterior and posterior position. The patient then underwent sedation by the anesthesia service. Once an appropriate level of sedation was achieved, the patient received a single biphasic, synchronized 200J shock with prompt conversion to sinus rhythm. No apparent complications.  Toribio Fuel, MD  2:17 PM

## 2024-04-02 NOTE — H&P (View-Only) (Signed)
 Advanced Heart Failure Rounding Note   Subjective:    Remains in AF - rates 100-110. Denies SOB, orthopnea or PND.   Feels ok Decent diuresis on IV lasix .     Objective:   Weight Range:  Vital Signs:   Temp:  [98 F (36.7 C)-98.6 F (37 C)] 98.2 F (36.8 C) (11/07 0745) Pulse Rate:  [43-117] 43 (11/07 0745) Resp:  [11-18] 18 (11/07 0745) BP: (109-131)/(85-99) 112/88 (11/07 0745) SpO2:  [92 %-98 %] 92 % (11/07 0745) Weight:  [115.3 kg-118 kg] 115.3 kg (11/07 0525) Last BM Date : 03/31/24  Weight change: Filed Weights   04/01/24 1148 04/01/24 1420 04/02/24 0525  Weight: 118 kg 115.3 kg 115.3 kg    Intake/Output:   Intake/Output Summary (Last 24 hours) at 04/02/2024 0942 Last data filed at 04/02/2024 0700 Gross per 24 hour  Intake 496.8 ml  Output 2025 ml  Net -1528.2 ml     Physical Exam: General:  Well appearing. No resp difficulty HEENT: normal Neck: supple. JVP flat. Carotids 2+ bilat; no bruits. No lymphadenopathy or thryomegaly appreciated. Cor: irreg tachy Lungs: clear Abdomen: soft, nontender, nondistended. No hepatosplenomegaly. No bruits or masses. Good bowel sounds. Extremities: no cyanosis, clubbing, rash, edema Neuro: alert & orientedx3, cranial nerves grossly intact. moves all 4 extremities w/o difficulty. Affect pleasant  Telemetry: AF 100-110 Personally reviewed   Labs: Basic Metabolic Panel: Recent Labs  Lab 04/01/24 1133 04/02/24 0306  NA 139 134*  K 4.0 3.9  CL 104 100  CO2 25 22  GLUCOSE 202* 364*  BUN 27* 28*  CREATININE 1.22 1.53*  CALCIUM  8.8* 8.7*  MG 2.1 1.9    Liver Function Tests: No results for input(s): AST, ALT, ALKPHOS, BILITOT, PROT, ALBUMIN in the last 168 hours. No results for input(s): LIPASE, AMYLASE in the last 168 hours. No results for input(s): AMMONIA in the last 168 hours.  CBC: Recent Labs  Lab 04/01/24 1133 04/02/24 0306  WBC 6.7 8.7  HGB 13.7 14.1  HCT 42.3 43.4  MCV  88.3 88.6  PLT 234 225    Cardiac Enzymes: No results for input(s): CKTOTAL, CKMB, CKMBINDEX, TROPONINI in the last 168 hours.  BNP: BNP (last 3 results) Recent Labs    04/01/24 1133  BNP 1,721.0*    ProBNP (last 3 results) Recent Labs    06/03/23 1527 03/17/24 1433  PROBNP 414.0* 1,502.0*      Other results:  Imaging: DG Chest 2 View Result Date: 04/01/2024 EXAM: 2 VIEW(S) XRAY OF THE CHEST 04/01/2024 11:13:00 AM COMPARISON: Chest radiograph dated 05/05/2023 and 07/26/2019. CLINICAL HISTORY: SOB FINDINGS: LUNGS AND PLEURA: Similar left basilar scarring. No appreciable acute consolidation. No definite pleural effusion or pneumothorax. HEART AND MEDIASTINUM: Stable cardiomegaly. Single lead left-sided defibrillator. BONES AND SOFT TISSUES: Partially visualized left reverse shoulder arthroplasty. No acute osseous abnormality. IMPRESSION: 1. No acute cardiopulmonary findings. 2. Stable cardiomegaly. Electronically signed by: Shahmeer Lateef MD 04/01/2024 12:21 PM EST RP Workstation: [id number]     Medications:     Scheduled Medications:  apixaban   5 mg Oral BID   atorvastatin   80 mg Oral Daily   carvedilol   12.5 mg Oral BID WC   digoxin   0.125 mg Oral Daily   insulin  aspart  0-20 Units Subcutaneous TID WC   insulin  glargine-yfgn  14 Units Subcutaneous QHS   levothyroxine   25 mcg Oral Daily   PARoxetine   30 mg Oral Daily   sacubitril -valsartan   1 tablet Oral BID   sodium  chloride flush  3 mL Intravenous Q12H   spironolactone   25 mg Oral Daily    Infusions:  sodium chloride      sodium chloride  20 mL/hr at 04/01/24 2208   amiodarone 30 mg/hr (04/02/24 0644)   magnesium sulfate bolus IVPB 2 g (04/02/24 0942)    PRN Medications: sodium chloride , acetaminophen , ondansetron  (ZOFRAN ) IV, sodium chloride  flush   Assessment/plan:    1. Acute on chronic systolic HF due to NICM  - Unclear etiology. EF 30-35% by cath with NICM. cMRI 1/18 with EF 28%.  -  Etiology remains unclear - possible due to immuno-therapy. No evidence of scar of infiltrative process on MRI.  - Echo 4/18 EF 35-40%. Echo 04/2017 EF 50-55% - Echo 3/21 EF 10-15% Mild RV - Echo 7/21 EF 20-25%. RV normal.  - Echo 12/21 EF 30% Personally reviewed - cMRI 2/22 EF 20% RV 20% No LGE - Echo 11/22 EF 25-30% RV ok   - Echo 2/24: EF 20-25%. RV ok  - cMRI 9/24 EF 21% RV EF 32% No LGE - s/p MDT 12/24 - NYHA IIIb. Volume up on exam and by OptiVol. In setting of AF. - Volume improved with IV lasix . Can stop IV lasix  - Will reset Optivol after DC-CV - Reduce coreg  to 12.5 mg BID with acute exacerbation - Reduce entresto  49/51 mg bid with low BP  - Continue spiro 12.5 mg daily. - Continue digoxin  0.125 mg daily - No SGLT2i with Type I DM - Genetic evaluation was negative   2. Paroxysmal atrial fibrillation - recurrence 10/31 - S/p DCCV to SR 03/19/24 - EP following. Loading IV amio now. Plan DC-CV today. With short-term amio load followed by outpatient abaltion - Continue Eliquis  5 mg BID   3. Metastatic Renal Cell CA with lung nodules - s/p nephrectomy.  - Follows at [facility].  - Felt to be in remission > 8 years    4. HTN  - BP running low.  - Changes as above   5. CAD - 1v in small RCA.  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 - Followed by Dr. Faythe at [facility].   7. Obesity - Body mass index is 33.41 kg/m. - He is on GLP1   Likely home after DC-CV today on po amio   Length of Stay: 0   Toribio Fuel MD 04/02/2024, 9:42 AM  Advanced Heart Failure Team Pager [phone] (M-F; 7a - 4p)  Please contact [facility] for night-coverage after hours (4p -7a ) and weekends on [email or web address]

## 2024-04-02 NOTE — TOC Initial Note (Addendum)
 Transition of Care [facility]) - Initial/Assessment Note    Patient Details  Name: Gregory [name] MRN: [id number] Date of Birth: 10/09/1960  Transition of Care [facility]) CM/SW Contact:    Arlana JINNY Nicholaus [place] Phone Number: [phone] 04/02/2024, 12:08 PM  Clinical Narrative:    HF CSW met with the patient at bedside. Patient stated that he lives with his wife and son. Patient stated that he has no history of HH services. Patient stated that he does not use any equipment. Patient stated that he has a scale at home. Patient stated that he has a PCP. CSW explained that a hospital follow up appointment is typically scheduled closer towards dc. Patient is agreeable and prefers morning appointments. Patients wife will provide transportation at dc.   TOC consult was placed for [facility] screen and SNF placement. Will continue to follow.   HF CSW/CM will continue to follow and monitor for dc readiness.                    Patient Goals and CMS Choice            Expected Discharge Plan and Services                                              Prior Living Arrangements/Services                       Activities of Daily Living   ADL Screening (condition at time of admission) Independently performs ADLs?: Yes (appropriate for developmental age) Is the patient deaf or have difficulty hearing?: No Does the patient have difficulty seeing, even when wearing glasses/contacts?: No Does the patient have difficulty concentrating, remembering, or making decisions?: No  Permission Sought/Granted                  Emotional Assessment              Admission diagnosis:  Atrial fibrillation (HCC) [I48.91] Atrial fibrillation, unspecified type [facility]) [I48.91] Patient Active Problem List   Diagnosis Date Noted   Atrial fibrillation (HCC) 04/01/2024   Erectile dysfunction 03/17/2024   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   Long term current use of insulin  (HCC)  03/17/2024   Hyperglycemia due to type 2 diabetes mellitus (HCC) 03/17/2024   Bigeminal rhythm 06/03/2023   Pain in joint of left shoulder 05/12/2023   NICM (nonischemic cardiomyopathy) (HCC) 03/25/2023   Encounter for general adult medical examination with abnormal findings 04/25/2021   Vitamin D  deficiency disease 07/29/2018   Hyperlipidemia LDL goal <70 07/29/2018   Hypothyroidism 08/19/2017   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   Manifestations of thiamine  deficiency 02/20/2017   Other dietary vitamin B12 deficiency anemia 02/17/2017   Dietary vitamin B12 deficiency anemia 02/17/2017   Chronic systolic CHF (congestive heart failure) (HCC) 01/24/2017   Poorly controlled type 2 diabetes mellitus with complication (HCC) 01/13/2017   Primary malignant neoplasm of kidney with metastasis from kidney to other site (HCC) 05/29/2016   Obesity (BMI 30-39.9) 08/22/2011   Pure hyperglyceridemia 06/21/2007   Macular degeneration (senile) of retina 06/21/2007   Malignant essential hypertension with congestive heart failure (HCC) 06/21/2007   PCP:  [name], MD Pharmacy:   [id number] - [place], [place] - [address] AT [facility] [address]  [address] [place] [address] Phone: [phone] Fax: [phone]     Social Drivers of Health (SDOH) Social History: SDOH Screenings   Food Insecurity: No Food Insecurity (04/01/2024)  Housing: Low Risk  (04/01/2024)  Transportation Needs: No Transportation Needs (04/01/2024)  Utilities: Not At Risk (04/01/2024)  Alcohol Screen: Low Risk  (03/16/2024)  Depression (PHQ2-9): Low Risk  (08/11/2023)  Financial Resource Strain: Low Risk  (03/16/2024)  Physical Activity: Inactive (03/16/2024)  Social Connections: Moderately Integrated (03/16/2024)  Stress: Stress Concern Present (03/16/2024)  Tobacco Use: Low Risk  (04/01/2024)   SDOH Interventions:     Readmission Risk  Interventions     No data to display

## 2024-04-02 NOTE — Anesthesia Procedure Notes (Signed)
 Procedure Name: MAC Date/Time: 04/02/2024 2:29 PM  Performed by: Corinne Garnette BRAVO, MDPre-anesthesia Checklist: Patient identified, Emergency Drugs available, Suction available, Patient being monitored and Timeout performed Patient Re-evaluated:Patient Re-evaluated prior to induction Induction Type: IV induction

## 2024-04-02 NOTE — Anesthesia Preprocedure Evaluation (Signed)
 Anesthesia Evaluation  Patient identified by MRN, date of birth, ID band Patient awake    Reviewed: Allergy & Precautions, NPO status , Patient's Chart, lab work & pertinent test results  History of Anesthesia Complications (+) PONV and history of anesthetic complications  Airway Mallampati: II  TM Distance: >3 FB Neck ROM: Full    Dental  (+) Teeth Intact, Dental Advisory Given   Pulmonary neg pulmonary ROS   Pulmonary exam normal breath sounds clear to auscultation       Cardiovascular hypertension, +CHF  + dysrhythmias Atrial Fibrillation (-) pacemaker+ Cardiac Defibrillator  Rhythm:Irregular Rate:Abnormal     Neuro/Psych  PSYCHIATRIC DISORDERS Anxiety     negative neurological ROS     GI/Hepatic negative GI ROS, Neg liver ROS,,,  Endo/Other  diabetes, Type 2, Insulin  DependentHypothyroidism  Obesity   Renal/GU Renal disease     Musculoskeletal  (+) Arthritis ,    Abdominal   Peds  Hematology negative hematology ROS (+)   Anesthesia Other Findings Day of surgery medications reviewed with the patient.  Reproductive/Obstetrics                              Anesthesia Physical Anesthesia Plan  ASA: 4  Anesthesia Plan: General   Post-op Pain Management: Minimal or no pain anticipated   Induction: Intravenous  PONV Risk Score and Plan: 3 and TIVA  Airway Management Planned: Mask  Additional Equipment:   Intra-op Plan:   Post-operative Plan:   Informed Consent: I have reviewed the patients History and Physical, chart, labs and discussed the procedure including the risks, benefits and alternatives for the proposed anesthesia with the patient or authorized representative who has indicated his/her understanding and acceptance.     Dental advisory given  Plan Discussed with: CRNA  Anesthesia Plan Comments:          Anesthesia Quick Evaluation

## 2024-04-02 NOTE — Plan of Care (Signed)
  Problem: Education: Goal: Knowledge of General Education information will improve Description: Including pain rating scale, medication(s)/side effects and non-pharmacologic comfort measures Outcome: Progressing   Problem: Health Behavior/Discharge Planning: Goal: Ability to manage health-related needs will improve Outcome: Progressing   Problem: Clinical Measurements: Goal: Ability to maintain clinical measurements within normal limits will improve Outcome: Progressing Goal: Will remain free from infection Outcome: Progressing Goal: Diagnostic test results will improve Outcome: Progressing Goal: Respiratory complications will improve Outcome: Progressing Goal: Cardiovascular complication will be avoided Outcome: Progressing   Problem: Activity: Goal: Risk for activity intolerance will decrease Outcome: Progressing   Problem: Nutrition: Goal: Adequate nutrition will be maintained Outcome: Progressing   Problem: Coping: Goal: Level of anxiety will decrease Outcome: Progressing   Problem: Pain Managment: Goal: General experience of comfort will improve and/or be controlled Outcome: Progressing   Problem: Safety: Goal: Ability to remain free from injury will improve Outcome: Progressing   Problem: Education: Goal: Ability to describe self-care measures that may prevent or decrease complications (Diabetes Survival Skills Education) will improve Outcome: Progressing Goal: Individualized Educational Video(s) Outcome: Progressing   Problem: Coping: Goal: Ability to adjust to condition or change in health will improve Outcome: Progressing   Problem: Health Behavior/Discharge Planning: Goal: Ability to identify and utilize available resources and services will improve Outcome: Progressing Goal: Ability to manage health-related needs will improve Outcome: Progressing   Problem: Health Behavior/Discharge Planning: Goal: Ability to identify and utilize available  resources and services will improve Outcome: Progressing Goal: Ability to manage health-related needs will improve Outcome: Progressing   Problem: Health Behavior/Discharge Planning: Goal: Ability to identify and utilize available resources and services will improve Outcome: Progressing Goal: Ability to manage health-related needs will improve Outcome: Progressing   Problem: Metabolic: Goal: Ability to maintain appropriate glucose levels will improve Outcome: Progressing   Problem: Nutritional: Goal: Maintenance of adequate nutrition will improve Outcome: Progressing Goal: Progress toward achieving an optimal weight will improve Outcome: Progressing   Problem: Skin Integrity: Goal: Risk for impaired skin integrity will decrease Outcome: Progressing   Problem: Tissue Perfusion: Goal: Adequacy of tissue perfusion will improve Outcome: Progressing

## 2024-04-02 NOTE — Transfer of Care (Signed)
 Immediate Anesthesia Transfer of Care Note  Patient: Gregory [name]  Procedure(s) Performed: CARDIOVERSION  Patient Location: PACU  Anesthesia Type:MAC  Level of Consciousness: awake, drowsy, and patient cooperative  Airway & Oxygen Therapy: Patient Spontanous Breathing and Patient connected to face mask oxygen  Post-op Assessment: Report given to RN, Post -op Vital signs reviewed and stable, Patient moving all extremities, and Patient moving all extremities X 4  Post vital signs: Reviewed and stable  Last Vitals:  Vitals Value Taken Time  BP 103/91 04/02/24 14:29  Temp    Pulse 76 04/02/24 14:29  Resp 10 04/02/24 14: 29  SpO2 98 % 04/02/24 14:29  Vitals shown include unfiled device data.  Last Pain:  Vitals:   04/02/24 1413  TempSrc:   PainSc: 0-No pain         Complications: No notable events documented.

## 2024-04-03 DIAGNOSIS — D6869 Other thrombophilia: Secondary | ICD-10-CM

## 2024-04-03 DIAGNOSIS — I5022 Chronic systolic (congestive) heart failure: Secondary | ICD-10-CM

## 2024-04-03 DIAGNOSIS — I4819 Other persistent atrial fibrillation: Secondary | ICD-10-CM | POA: Diagnosis not present

## 2024-04-03 LAB — CBC
HCT: 41 % (ref 39.0–52.0)
Hemoglobin: 13.3 g/dL (ref 13.0–17.0)
MCH: 28.5 pg (ref 26.0–34.0)
MCHC: 32.4 g/dL (ref 30.0–36.0)
MCV: 87.8 fL (ref 80.0–100.0)
Platelets: 218 K/uL (ref 150–400)
RBC: 4.67 MIL/uL (ref 4.22–5.81)
RDW: 12.7 % (ref 11.5–15.5)
WBC: 9.5 K/uL (ref 4.0–10.5)
nRBC: 0 % (ref 0.0–0.2)

## 2024-04-03 LAB — BASIC METABOLIC PANEL WITH GFR
Anion gap: 12 (ref 5–15)
BUN: 29 mg/dL — ABNORMAL HIGH (ref 8–23)
CO2: 26 mmol/L (ref 22–32)
Calcium: 8.8 mg/dL — ABNORMAL LOW (ref 8.9–10.3)
Chloride: 101 mmol/L (ref 98–111)
Creatinine, Ser: 1.67 mg/dL — ABNORMAL HIGH (ref 0.61–1.24)
GFR, Estimated: 45 mL/min — ABNORMAL LOW (ref >60)
Glucose, Bld: 177 mg/dL — ABNORMAL HIGH (ref 70–99)
Potassium: 3.8 mmol/L (ref 3.5–5.1)
Sodium: 139 mmol/L (ref 135–145)

## 2024-04-03 LAB — GLUCOSE, CAPILLARY
Glucose-Capillary: 174 mg/dL — ABNORMAL HIGH (ref 70–99)
Glucose-Capillary: 284 mg/dL — ABNORMAL HIGH (ref 70–99)
Glucose-Capillary: 299 mg/dL — ABNORMAL HIGH (ref 70–99)
Glucose-Capillary: 240 mg/dL — ABNORMAL HIGH (ref 70–99)

## 2024-04-03 LAB — TROPONIN I (HIGH SENSITIVITY)
Troponin I (High Sensitivity): 17 ng/L (ref <18)
Troponin I (High Sensitivity): 18 ng/L — ABNORMAL HIGH (ref <18)

## 2024-04-03 LAB — MAGNESIUM: Magnesium: 2.2 mg/dL (ref 1.7–2.4)

## 2024-04-03 MED ORDER — ALUM & MAG HYDROXIDE-SIMETH 200-200-20 MG/5ML PO SUSP
15.0000 mL | Freq: Four times a day (QID) | ORAL | Status: DC | PRN
  Filled 2024-04-03: qty 30

## 2024-04-03 MED ORDER — AMIODARONE HCL 200 MG PO TABS: 200.0000 mg | ORAL_TABLET | Freq: Every day | ORAL | Status: DC

## 2024-04-03 MED ORDER — AMIODARONE HCL 200 MG PO TABS
400.0000 mg | ORAL_TABLET | Freq: Two times a day (BID) | ORAL | Status: DC
  Administered 2024-04-03 – 2024-04-04 (×3): 400 mg via ORAL
  Filled 2024-04-03 (×3): qty 2

## 2024-04-03 MED ORDER — CAMPHOR-MENTHOL 0.5-0.5 % EX LOTN: TOPICAL_LOTION | CUTANEOUS | Status: DC | PRN

## 2024-04-03 NOTE — Progress Notes (Signed)
 Notified Scot Ford, [place] that patient had 5 beats of v tach at 1124. Patient denies symptoms. PA reviewed telemetry. No further orders received at this time.

## 2024-04-03 NOTE — Plan of Care (Signed)

## 2024-04-03 NOTE — Progress Notes (Addendum)
 Paged by nursing staff regarding acute onset of intense substernal burning sensation after taking morning medication.  Looking back, patient has a history of nonischemic cardiomyopathy and a single-vessel CAD diagnosed in 2017.  Patient reportedly had a 75 to 80% mid small to moderate-sized RCA lesion on cath in 2017.  Shortly after taking amiodarone this morning, patient developed intense substernal burning sensation.  EKG is unchanged.  Will trend troponin.  However suspect the patient likely has acid reflux.  Will order Mylanta.  Would not be surprised if troponin come back borderline elevated due to AKI and recent A-fib.  However if the troponin trended up significantly, will need to consider ischemic workup.    Addendum: Serial trop 17-->18, not ACS

## 2024-04-03 NOTE — Progress Notes (Signed)
 Notified Scot Ford, [place] that patient complains of intense heartburn and describes burning in center of chest. EKG and vitals performed and on chart. PA came up to assess patient and will enter orders.

## 2024-04-03 NOTE — Progress Notes (Signed)
 Notified Dr. Kennyth that patient had 12 beat of PSVT at 0824. Patient denies symptoms. EKG and vital signs performed and on chart. No further orders received.

## 2024-04-03 NOTE — Progress Notes (Addendum)
  Progress Note  Patient Name: Gregory [name] Date of Encounter: 04/03/2024 [facility] Cardiologist: None   Interval Summary   Successful cardioversion to sinus yesterday. No acute overnight events. Patient reports feeling relatively well. No new or acute complaints.   Vital Signs Vitals:   04/03/24 0406 04/03/24 0520 04/03/24 0735 04/03/24 0844  BP: (!) 121/97  (!) 121/91 113/83  Pulse: 83  83 84  Resp:   18 18  Temp: 98.3 F (36.8 C)  97.9 F (36.6 C)   TempSrc: Oral  Oral   SpO2: 98%  91% 90%  Weight:  114.5 kg    Height:        Intake/Output Summary (Last 24 hours) at 04/03/2024 0918 Last data filed at 04/03/2024 0819 Gross per 24 hour  Intake 120 ml  Output 950 ml  Net -830 ml      04/03/2024    5:20 AM 04/02/2024    5:25 AM 04/01/2024    2:20 PM  Last 3 Weights  Weight (lbs) 252 lb 6.8 oz 254 lb 3.1 oz 254 lb 3.1 oz  Weight (kg) 114.5 kg 115.3 kg 115.3 kg      Telemetry/ECG  Sinus rhythm, short PSVT, NSVT - Personally Reviewed  Physical Exam  General: Well developed, in no acute distress.  Neck: No JVD.  Cardiac: Normal rate, regular rhythm.  Resp: Normal work of breathing.  Ext: No edema.  Neuro: No gross focal deficits.  Psych: Normal affect.   Assessment & Plan   #Persistent atrial fibrillation: S/p DCCV on 11/7.  #Hypercoagulable state due to AF #High risk medication use -Continue amiodarone 400mg  BID for 2 weeks, then 200mg  daily thereafter. -Continue 5mg  eliquis  BID. -Continue carvedilol  12.5mg  BID (home dose 25 BID). -Continue digoxin  0.125mg . -He is being scheduled for outpatient AF ablation w Dr. Inocencio.    #Chronic systolic heart failure: Received IV lasix  x 1 with robust UOP. Now feels dry. Associated with bump in Cr. He has 1 kidney. Would like to see Cr plateau or downtrend prior to discharge.  -Liberalize fluid intake. Holding diuresis today. Repeat BMP in AM. -Continue GDMT regimen of carvedilol , Entresto  49-51mg ,  spironolactone  25mg  daily.    #S/p ICD -No ventricular arrhythmias.  Signed, Fonda Kitty, MD

## 2024-04-04 ENCOUNTER — Encounter (HOSPITAL_COMMUNITY): Payer: Self-pay | Admitting: Internal Medicine

## 2024-04-04 DIAGNOSIS — Z79899 Other long term (current) drug therapy: Secondary | ICD-10-CM | POA: Diagnosis not present

## 2024-04-04 DIAGNOSIS — D6869 Other thrombophilia: Secondary | ICD-10-CM | POA: Diagnosis not present

## 2024-04-04 DIAGNOSIS — I5022 Chronic systolic (congestive) heart failure: Secondary | ICD-10-CM | POA: Diagnosis not present

## 2024-04-04 DIAGNOSIS — I4819 Other persistent atrial fibrillation: Secondary | ICD-10-CM | POA: Diagnosis not present

## 2024-04-04 LAB — BASIC METABOLIC PANEL WITH GFR
Anion gap: 11 (ref 5–15)
BUN: 24 mg/dL — ABNORMAL HIGH (ref 8–23)
CO2: 27 mmol/L (ref 22–32)
Calcium: 8.8 mg/dL — ABNORMAL LOW (ref 8.9–10.3)
Chloride: 100 mmol/L (ref 98–111)
Creatinine, Ser: 1.5 mg/dL — ABNORMAL HIGH (ref 0.61–1.24)
GFR, Estimated: 52 mL/min — ABNORMAL LOW (ref >60)
Glucose, Bld: 239 mg/dL — ABNORMAL HIGH (ref 70–99)
Potassium: 4.3 mmol/L (ref 3.5–5.1)
Sodium: 138 mmol/L (ref 135–145)

## 2024-04-04 LAB — CBC
HCT: 39.2 % (ref 39.0–52.0)
Hemoglobin: 12.8 g/dL — ABNORMAL LOW (ref 13.0–17.0)
MCH: 28.6 pg (ref 26.0–34.0)
MCHC: 32.7 g/dL (ref 30.0–36.0)
MCV: 87.5 fL (ref 80.0–100.0)
Platelets: 202 K/uL (ref 150–400)
RBC: 4.48 MIL/uL (ref 4.22–5.81)
RDW: 12.5 % (ref 11.5–15.5)
WBC: 6.1 K/uL (ref 4.0–10.5)
nRBC: 0 % (ref 0.0–0.2)

## 2024-04-04 LAB — GLUCOSE, CAPILLARY: Glucose-Capillary: 234 mg/dL — ABNORMAL HIGH (ref 70–99)

## 2024-04-04 LAB — MAGNESIUM: Magnesium: 2 mg/dL (ref 1.7–2.4)

## 2024-04-04 MED ORDER — SACUBITRIL-VALSARTAN 49-51 MG PO TABS: 1.0000 | ORAL_TABLET | Freq: Two times a day (BID) | ORAL | 3 refills | Status: DC

## 2024-04-04 MED ORDER — SPIRONOLACTONE 25 MG PO TABS: 25.0000 mg | ORAL_TABLET | Freq: Every day | ORAL | 6 refills | Status: DC

## 2024-04-04 MED ORDER — AMIODARONE HCL 200 MG PO TABS: ORAL_TABLET | ORAL | 0 refills | Status: DC

## 2024-04-04 MED ORDER — CARVEDILOL 12.5 MG PO TABS: 12.5000 mg | ORAL_TABLET | Freq: Two times a day (BID) | ORAL | 1 refills | Status: DC

## 2024-04-04 NOTE — Progress Notes (Signed)
 Discharge   Patient expressed verbal understanding of discharge POC.   Patient given time to ask any questions.  Additional education included in AVS.  Alert oriented in good spirits.   Tele and Right forearm PIV removed.  Pressure dressings intact.  Wife at bedside.     Discharging to Main A.

## 2024-04-04 NOTE — Plan of Care (Signed)

## 2024-04-04 NOTE — Care Management (Signed)
 Patient provided with 30 day and copay reduction cards for Entresto 

## 2024-04-04 NOTE — Progress Notes (Signed)
  Progress Note  Patient Name: BARNETT ELZEY Date of Encounter: 04/04/2024 [facility] Cardiologist: None   Interval Summary   No acute overnight events. Patient reports feeling relatively well. No new or acute complaints.   Vital Signs Vitals:   04/03/24 1535 04/03/24 2020 04/03/24 2346 04/04/24 0433  BP:  112/79 116/85 (!) 125/91  Pulse: 88 78 73 71  Resp: 19 18 16 16   Temp:  98.6 F (37 C) 98.5 F (36.9 C) 98.1 F (36.7 C)  TempSrc: Oral Oral Oral Oral  SpO2: 99% 95% 95% 97%  Weight:    114.8 kg  Height:        Intake/Output Summary (Last 24 hours) at 04/04/2024 0815 Last data filed at 04/03/2024 2354 Gross per 24 hour  Intake 340 ml  Output 1075 ml  Net -735 ml      04/04/2024    4:33 AM 04/03/2024    5:20 AM 04/02/2024    5:25 AM  Last 3 Weights  Weight (lbs) 253 lb 252 lb 6.8 oz 254 lb 3.1 oz  Weight (kg) 114.76 kg 114.5 kg 115.3 kg      Telemetry/ECG  Sinus rhythm, NSVT - Personally Reviewed  Physical Exam  General: Well developed, in no acute distress.  Neck: No JVD.  Cardiac: Normal rate, regular rhythm.  Resp: Normal work of breathing.  Ext: No edema.  Neuro: No gross focal deficits.  Psych: Normal affect.   Assessment & Plan   #Persistent atrial fibrillation: S/p DCCV on 11/7.  #Hypercoagulable state due to AF #High risk medication use -Continue amiodarone 400mg  BID for total of 2 weeks, then 200mg  daily thereafter. -Continue 5mg  eliquis  BID. -Continue carvedilol  12.5mg  BID (home dose 25 BID). -Continue digoxin  0.125mg . -He is being scheduled for outpatient AF ablation w Dr. Inocencio.    #Chronic systolic heart failure: Received IV lasix  x 1 with robust UOP. Now feels dry. Associated with bump in Cr. He has 1 kidney. Encouragingly, creatinine peaked yesterday and is now downtrending.  -Continue GDMT regimen of carvedilol , Entresto  49-51mg , spironolactone  25mg  daily.    #S/p ICD -No ventricular arrhythmias.  Okay for discharge.    Signed, Fonda Kitty, MD

## 2024-04-05 ENCOUNTER — Telehealth: Payer: Self-pay

## 2024-04-05 DIAGNOSIS — D225 Melanocytic nevi of trunk: Secondary | ICD-10-CM | POA: Diagnosis not present

## 2024-04-05 DIAGNOSIS — L814 Other melanin hyperpigmentation: Secondary | ICD-10-CM | POA: Diagnosis not present

## 2024-04-05 DIAGNOSIS — B079 Viral wart, unspecified: Secondary | ICD-10-CM | POA: Diagnosis not present

## 2024-04-05 DIAGNOSIS — L281 Prurigo nodularis: Secondary | ICD-10-CM | POA: Diagnosis not present

## 2024-04-05 DIAGNOSIS — L538 Other specified erythematous conditions: Secondary | ICD-10-CM | POA: Diagnosis not present

## 2024-04-05 DIAGNOSIS — D492 Neoplasm of unspecified behavior of bone, soft tissue, and skin: Secondary | ICD-10-CM | POA: Diagnosis not present

## 2024-04-05 DIAGNOSIS — L821 Other seborrheic keratosis: Secondary | ICD-10-CM | POA: Diagnosis not present

## 2024-04-05 DIAGNOSIS — L249 Irritant contact dermatitis, unspecified cause: Secondary | ICD-10-CM | POA: Diagnosis not present

## 2024-04-05 DIAGNOSIS — L570 Actinic keratosis: Secondary | ICD-10-CM | POA: Diagnosis not present

## 2024-04-05 NOTE — Transitions of Care (Post Inpatient/ED Visit) (Signed)
 04/05/2024  Name: Gregory [name] MRN: [id number] DOB: 1961/02/29  Today's TOC FU Call Status: Today's TOC FU Call Status:: Successful TOC FU Call Completed TOC FU Call Complete Date: 04/05/24 Patient's Name and Date of Birth confirmed.  Transition Care Management Follow-up Telephone Call Date of Discharge: 04/04/24 Discharge Facility: [name]) Type of Discharge: Inpatient Admission Primary Inpatient Discharge Diagnosis:: A fib How have you been since you were released from the hospital?: Better (feeling better) Any questions or concerns?: No  Items Reviewed: Did you receive and understand the discharge instructions provided?: Yes Medications obtained,verified, and reconciled?: Yes (Medications Reviewed) Any new allergies since your discharge?: No Dietary orders reviewed?: Yes Type of Diet Ordered:: low sodium heart healthy Do you have support at home?: Yes People in Home [RPT]: spouse Name of Support/Comfort Primary Source: [name]  Medications Reviewed Today: Medications Reviewed Today     Reviewed by Rumalda Alan PENNER, RN (Registered Nurse) on 04/05/24 at 1509  Med List Status: <None>   Medication Order Taking? Sig Documenting Provider Last Dose Status Informant  amiodarone (PACERONE) 200 MG tablet [id number] Yes Take 2 tablets (400 mg total) by mouth 2 (two) times daily for 14 days, THEN 1 tablet (200 mg total) daily. Janene Boer, [place]  Active   apixaban  (ELIQUIS ) 5 MG TABS tablet [id number] Yes Take 1 tablet (5 mg total) by mouth 2 (two) times daily. Waddell Danelle ORN, MD  Active Self, Pharmacy Records  atorvastatin  (LIPITOR) 80 MG tablet [id number] Yes Take 1 tablet (80 mg total) by mouth daily. Bensimhon, Toribio SAUNDERS, MD  Active Self, Pharmacy Records  carvedilol  (COREG ) 12.5 MG tablet [id number] Yes Take 1 tablet (12.5 mg total) by mouth 2 (two) times daily with a meal. Janene Boer, [place]  Active   cetirizine (ZYRTEC) 10 MG tablet [id number] Yes Take 10 mg by mouth daily. [provider]  Active Self, Pharmacy Records  Cholecalciferol  (VITAMIN D -3 PO) [id number] Yes Take 1 capsule by mouth in the morning and at bedtime. [provider]  Active Self, Pharmacy Records  digoxin  (LANOXIN ) 0.125 MG tablet [id number] Yes TAKE 1 TABLET(125 MCG) BY MOUTH DAILY Bensimhon, Toribio SAUNDERS, MD  Active Self, Pharmacy Records  FIASP  FLEXTOUCH 100 UNIT/ML FlexTouch Pen [id number] Yes Inject 15-35 Units into the skin 3 (three) times daily before meals. Dose per sliding scale [provider]  Active Self, Pharmacy Records           Med Note (CRUTHIS, CHLOE C   Sat Jun 21, 2023  8:32 AM)    fluticasone  (CUTIVATE ) 0.05 % cream [id number] Yes Apply 1 Application topically daily as needed (irritation). [provider]  Active Self, Pharmacy Records  furosemide  (LASIX ) 40 MG tablet [id number] Yes TAKE 1 TABLET BY MOUTH THREE TIMES A WEEK, ON MONDAY, WEDNESDAY, AND FRIDAY. MAY TAKE ADDITIONAL TABLETS AS NEEDED. Bensimhon, Toribio SAUNDERS, MD  Active Self, Pharmacy Records  levothyroxine  (SYNTHROID , LEVOTHROID) 25 MCG tablet [id number] Yes TAKE 1 TABLET ONCE DAILY BEFORE BREAKFAST Curcio, Kristin R, NP  Active Self, Pharmacy Records  metFORMIN  (GLUCOPHAGE -XR) 500 MG 24 hr tablet [id number] Yes Take 500 mg by mouth daily. [provider]  Active Self, Pharmacy Records  PARoxetine  (PAXIL ) 30 MG tablet [id number] Yes Take 1 tablet (30 mg total) by mouth daily. [name], MD  Active Self, Pharmacy Records  sacubitril -valsartan  (ENTRESTO ) 49-51 MG [id number] Yes Take 1 tablet by mouth 2 (two) times daily. Meng, Hao, [place]  Active   spironolactone  (ALDACTONE ) 25 MG tablet  [id number] Yes Take 1 tablet (25 mg total) by mouth daily. Meng, Hao, [place]  Active   tirzepatide  (MOUNJARO ) 15 MG/0.5ML Pen [id number] Yes Inject 15 mg into the skin once a week. [name], MD  Active Self, Pharmacy Records  [facility] 100 UNIT/ML FlexTouch Pen [id number] Yes Inject 14 Units into the skin at  bedtime. [provider]  Active Self, Pharmacy Records           Med Note (CRUTHIS, CHLOE C   Sat Jun 21, 2023  8:32 AM)              Home Care and Equipment/Supplies: Were Home Health Services Ordered?: No Any new equipment or medical supplies ordered?: No  Functional Questionnaire: Do you need assistance with bathing/showering or dressing?: No Do you need assistance with meal preparation?: No Do you need assistance with eating?: No Do you have difficulty maintaining continence: No Do you need assistance with getting out of bed/getting out of a chair/moving?: No Do you have difficulty managing or taking your medications?: No  Follow up appointments reviewed: PCP Follow-up appointment confirmed?: No MD Provider Line Number:[phone] Given: No Specialist Hospital Follow-up appointment confirmed?: Yes Date of Specialist follow-up appointment?: 04/06/24 Follow-Up Specialty Provider:: Cardiology  SDOH Interventions Today    Flowsheet Row Most Recent Value  SDOH Interventions   Food Insecurity Interventions Intervention Not Indicated  Housing Interventions Intervention Not Indicated  Transportation Interventions Intervention Not Indicated  Utilities Interventions Intervention Not Indicated   Today's Vitals   04/05/24 1515 04/05/24 1516  BP:  (!) 124/90  Pulse:  70  SpO2:  97%  PainSc: 0-No pain    Patient reports that he is doing very well. Reports that he will see cardiology tomorrow am.  Encouraged patient to ask about planned ablation.  Reviewed Afib zones Reviewed bleed precautions Encouraged patient to weigh daily and self monitor BP and pulse daily. Reviewed DM control and target A1c of 6.5 or less.  Reviewed his current CBG of 378. Reviewed recent dysuria and encouraged follow up with PCP. Reviewed heart failure zones and when to call MD for weight gain Reviewed when to call 911.  Reviewed activity restrictions.  Reviewed signs and symptoms of  infection.  Documented current Vital signs. Reviewed and offered 30 day TOC program and patient declined. Provided my contact information.   Alan Ee, RN, BSN, CEN [employer]- Transition of Care Team.  [facility] [phone]

## 2024-04-05 NOTE — Anesthesia Postprocedure Evaluation (Signed)
 Anesthesia Post Note  Patient: Gregory [name]  Procedure(s) Performed: CARDIOVERSION     Patient location during evaluation: Cath Lab Anesthesia Type: General Level of consciousness: awake and alert Pain management: pain level controlled Vital Signs Assessment: post-procedure vital signs reviewed and stable Respiratory status: spontaneous breathing, nonlabored ventilation and respiratory function stable Cardiovascular status: blood pressure returned to baseline and stable Postop Assessment: no apparent nausea or vomiting Anesthetic complications: no   No notable events documented.  Last Vitals:    Last Pain:                 [name]

## 2024-04-06 ENCOUNTER — Encounter: Payer: Self-pay | Admitting: Student

## 2024-04-06 ENCOUNTER — Ambulatory Visit: Attending: Student | Admitting: Student

## 2024-04-06 VITALS — BP 124/78 | HR 81 | Ht 74.0 in | Wt 259.8 lb

## 2024-04-06 DIAGNOSIS — I428 Other cardiomyopathies: Secondary | ICD-10-CM

## 2024-04-06 DIAGNOSIS — I5022 Chronic systolic (congestive) heart failure: Secondary | ICD-10-CM

## 2024-04-06 DIAGNOSIS — I480 Paroxysmal atrial fibrillation: Secondary | ICD-10-CM | POA: Diagnosis not present

## 2024-04-06 DIAGNOSIS — I10 Essential (primary) hypertension: Secondary | ICD-10-CM | POA: Diagnosis not present

## 2024-04-06 NOTE — Progress Notes (Signed)
  Electrophysiology Office Note:   ID:  Gregory [name], DOB [birth year], MRN [id number]  Primary Cardiologist: None Electrophysiologist: Will Gladis Norton, MD      History of Present Illness:   Gregory [name] is a 63 y.o. male with h/o NICM, ICD, and paroxysmal AF seen today for post hospital follow up.    Sene in clinic 03/18/2024 with fatigue and SOB, found to be in AF with RVR. Discussed cardioversion and AADs and ablation planning, but pt deferred AADs and planned for plain DCC.   Presented to HF clinic 11/6 with A/C CHF and AF and was admitted 11/6 - 11/9. Diuresed, loaded on IV amiodarone and underwent successful DCC 04/02/2024.  Discharged with plans for outpatient ablation  Since discharge from hospital the patient reports doing OK. Did have mild orthopnea last night and didn't sleep well. Took lasix  yesterday and planned to not take today. No chest pain or tachy-palpitations since he left hospital. Willing to proceed with ablation.   Review of systems complete and found to be negative unless listed in HPI.   EP Information / Studies Reviewed:    EKG is ordered today. Personal review as below.  EKG Interpretation Date/Time:  Tuesday April 06 2024 08:10:47 EST Ventricular Rate:  81 PR Interval:  222 QRS Duration:  136 QT Interval:  430 QTC Calculation: 499 R Axis:   -83  Text Interpretation: Sinus rhythm with 1st degree A-V block Possible Left atrial enlargement Left axis deviation Right bundle branch block When compared with ECG of 03-Apr-2024 10:20, Premature ventricular complexes are no longer Present Questionable change in initial forces of Lateral leads Confirmed by Lesia Sharper [phone]) on 04/06/2024 8:15:49 AM    ICD Interrogation-  Not checked today.   Arrhythmia/Device History MDT Single Chamber ICD implanted 05/05/2023 for CHF   Physical Exam:   VS:  BP 124/78   Pulse 81   Ht 6' 2 (1.88 m)   Wt 259 lb 12.8 oz (117.8 kg)   SpO2 99%   BMI 33.36 kg/m    Wt  Readings from Last 3 Encounters:  04/06/24 259 lb 12.8 oz (117.8 kg)  04/04/24 253 lb (114.8 kg)  04/01/24 260 lb 3.2 oz (118 kg)     GEN: No acute distress  NECK: No JVD; No carotid bruits CARDIAC: Regular rate and rhythm, no murmurs, rubs, gallops RESPIRATORY:  Clear to auscultation without rales, wheezing or rhonchi  ABDOMEN: Soft, non-tender, non-distended EXTREMITIES:  Trace edema; No deformity   ASSESSMENT AND PLAN:    Chronic systolic CHF  s/p Medtronic single chamber ICD  euvolemic today Stable on an appropriate medical regimen Device not checked today.  No changes today  Paroxysmal atrial fibrillation EKG today shows NSR Continue amiodarone 400 mg BID for total of 2 weeks, then 200 mg daily Plan outpatient ablation with Dr. Norton.  Risk, benefits, and alternatives to EP study and radiofrequency ablation for afib were also discussed in detail today. These risks include but are not limited to stroke, bleeding, vascular damage, tamponade, perforation, damage to the esophagus, lungs, and other structures, pulmonary vein stenosis, worsening renal function, and death.    Disposition:   Follow up with EP Team as usual post procedure   Signed, Sharper Prentice Lesia, PA-C

## 2024-04-06 NOTE — H&P (View-Only) (Signed)
  Electrophysiology Office Note:   ID:  Gregory [name], DOB [birth year], MRN [id number]  Primary Cardiologist: None Electrophysiologist: Will Gladis Norton, MD      History of Present Illness:   Gregory [name] is a 63 y.o. male with h/o NICM, ICD, and paroxysmal AF seen today for post hospital follow up.    Sene in clinic 03/18/2024 with fatigue and SOB, found to be in AF with RVR. Discussed cardioversion and AADs and ablation planning, but pt deferred AADs and planned for plain DCC.   Presented to HF clinic 11/6 with A/C CHF and AF and was admitted 11/6 - 11/9. Diuresed, loaded on IV amiodarone and underwent successful DCC 04/02/2024.  Discharged with plans for outpatient ablation  Since discharge from hospital the patient reports doing OK. Did have mild orthopnea last night and didn't sleep well. Took lasix  yesterday and planned to not take today. No chest pain or tachy-palpitations since he left hospital. Willing to proceed with ablation.   Review of systems complete and found to be negative unless listed in HPI.   EP Information / Studies Reviewed:    EKG is ordered today. Personal review as below.  EKG Interpretation Date/Time:  Tuesday April 06 2024 08:10:47 EST Ventricular Rate:  81 PR Interval:  222 QRS Duration:  136 QT Interval:  430 QTC Calculation: 499 R Axis:   -83  Text Interpretation: Sinus rhythm with 1st degree A-V block Possible Left atrial enlargement Left axis deviation Right bundle branch block When compared with ECG of 03-Apr-2024 10:20, Premature ventricular complexes are no longer Present Questionable change in initial forces of Lateral leads Confirmed by Lesia Sharper [phone]) on 04/06/2024 8:15:49 AM    ICD Interrogation-  Not checked today.   Arrhythmia/Device History MDT Single Chamber ICD implanted 05/05/2023 for CHF   Physical Exam:   VS:  BP 124/78   Pulse 81   Ht 6' 2 (1.88 m)   Wt 259 lb 12.8 oz (117.8 kg)   SpO2 99%   BMI 33.36 kg/m    Wt  Readings from Last 3 Encounters:  04/06/24 259 lb 12.8 oz (117.8 kg)  04/04/24 253 lb (114.8 kg)  04/01/24 260 lb 3.2 oz (118 kg)     GEN: No acute distress  NECK: No JVD; No carotid bruits CARDIAC: Regular rate and rhythm, no murmurs, rubs, gallops RESPIRATORY:  Clear to auscultation without rales, wheezing or rhonchi  ABDOMEN: Soft, non-tender, non-distended EXTREMITIES:  Trace edema; No deformity   ASSESSMENT AND PLAN:    Chronic systolic CHF  s/p Medtronic single chamber ICD  euvolemic today Stable on an appropriate medical regimen Device not checked today.  No changes today  Paroxysmal atrial fibrillation EKG today shows NSR Continue amiodarone 400 mg BID for total of 2 weeks, then 200 mg daily Plan outpatient ablation with Dr. Norton.  Risk, benefits, and alternatives to EP study and radiofrequency ablation for afib were also discussed in detail today. These risks include but are not limited to stroke, bleeding, vascular damage, tamponade, perforation, damage to the esophagus, lungs, and other structures, pulmonary vein stenosis, worsening renal function, and death.    Disposition:   Follow up with EP Team as usual post procedure   Signed, Sharper Prentice Lesia, PA-C

## 2024-04-06 NOTE — Patient Instructions (Addendum)
 Medication Instructions:  On the day that you go for your heart failure follow up appointment do not take your digoxin . Your physician recommends that you continue on your current medications as directed. Please refer to the Current Medication list given to you today.  *If you need a refill on your cardiac medications before your next appointment, please call your pharmacy*  Lab Work: None ordered If you have labs (blood work) drawn today and your tests are completely normal, you will receive your results only by: MyChart Message (if you have MyChart) OR A paper copy in the mail If you have any lab test that is abnormal or we need to change your treatment, we will call you to review the results.  Testing/Procedures: See letter  Follow-Up: At [facility], you and your health needs are our priority.  As part of our continuing mission to provide you with exceptional heart care, our providers are all part of one team.  This team includes your primary Cardiologist (physician) and Advanced Practice Providers or APPs (Physician Assistants and Nurse Practitioners) who all work together to provide you with the care you need, when you need it.  Your next appointment:   Follow up will be arranged for you

## 2024-04-07 ENCOUNTER — Ambulatory Visit: Admitting: Internal Medicine

## 2024-04-07 ENCOUNTER — Encounter: Payer: Self-pay | Admitting: Internal Medicine

## 2024-04-07 ENCOUNTER — Ambulatory Visit: Payer: Self-pay | Admitting: Internal Medicine

## 2024-04-07 VITALS — BP 104/60 | HR 66 | Temp 97.9°F | Ht 74.0 in | Wt 257.4 lb

## 2024-04-07 DIAGNOSIS — I5022 Chronic systolic (congestive) heart failure: Secondary | ICD-10-CM

## 2024-04-07 DIAGNOSIS — E559 Vitamin D deficiency, unspecified: Secondary | ICD-10-CM

## 2024-04-07 DIAGNOSIS — R8290 Unspecified abnormal findings in urine: Secondary | ICD-10-CM

## 2024-04-07 DIAGNOSIS — D518 Other vitamin B12 deficiency anemias: Secondary | ICD-10-CM

## 2024-04-07 DIAGNOSIS — I4811 Longstanding persistent atrial fibrillation: Secondary | ICD-10-CM

## 2024-04-07 LAB — URINALYSIS, ROUTINE W REFLEX MICROSCOPIC
Bilirubin Urine: NEGATIVE
Hgb urine dipstick: NEGATIVE
Ketones, ur: NEGATIVE
Nitrite: NEGATIVE
Specific Gravity, Urine: 1.015 (ref 1.000–1.030)
Total Protein, Urine: NEGATIVE
Urine Glucose: NEGATIVE
Urobilinogen, UA: 4 — AB (ref 0.0–1.0)
pH: 6 (ref 5.0–8.0)

## 2024-04-07 NOTE — Assessment & Plan Note (Signed)
 On Vit D

## 2024-04-07 NOTE — Progress Notes (Signed)
 Subjective:  Patient ID: Gregory [name], male    DOB: [birth year]  Age: 62 y.o. MRN: [id number]  CC: Hospitalization Follow-up [facility] follow up 11/06-11/09 for atrial fibrillation )   HPI Gregory [name] presents for a  post- hospital visit for Afib and his 2nd cardioversion.  He is concerned about his abnormal UA.  Per hx: Hospital Course:    Gregory [name] is a 63 y.o. male with history of obesity, metastatic R renal cell CA s/p R nephrectomy in 9/17 with the subsequent discovery of 2 pulmonary nodules whom the heart failure service follow for non-ischemic cardiomyopathy.    He was seen in AHF Clinic 04/01/24 for follow up after having cardioversion on 03/19/24. Found to be in symptomatic ERAF. He was admitted for IV diuresis and EP consult. Previously adamant that he did not want to start on amiodarone, due to possible thyroid  side effects. He is now agreeable to using amiodarone as bridge to ablation. He was started on IV amiodarone and taken for successful DCCV on 04/02/24. Of notes, Optivol readings have not been matching with exam, discussed with EP and his Optivol was reset.    The plan is to continue amiodarone 400 mg twice daily for 2 weeks, then 200 mg daily thereafter.  Carvedilol  has been reduced to 12.5 mg twice a day.  He is currently scheduled for outpatient A-fib ablation by Dr. Inocencio.  During this hospitalization, the patient did have a episode of chest discomfort 04/03/2024, EKG showed no acute changes, serial troponin was borderline however remained flat inconsistent with ACS.  Symptom resolved.  Suspect GERD. Seen today by Dr. Kennyth and deemed approprate for discharge with close AHF and EP follow up.  Consider checking dig level on follow-up.     Discharge Weight: 254 lb Discharge Vitals: Blood pressure (!) 125/91, pulse 71, temperature 98.1 F (36.7 C), temperature source Oral, resp. rate 16, height 6' 2 (1.88 m), weight 114.8 kg, SpO2 97%.   Labs: Recent Labs        Lab Results  Component Value Date    WBC 6.1 04/04/2024    HGB 12.8 (L) 04/04/2024    HCT 39.2 04/04/2024    MCV 87.5 04/04/2024    PLT 202 04/04/2024      Last Labs     Recent Labs  Lab 04/04/24 0216  NA 138  K 4.3  CL 100  CO2 27  BUN 24*  CREATININE 1.50*  CALCIUM  8.8*  GLUCOSE 239*      Recent Labs       Lab Results  Component Value Date    CHOL 152 12/17/2023    HDL 37 12/17/2023    LDLCALC 68 12/17/2023    TRIG 295 (A) 12/17/2023      BNP (last 3 results) Recent Labs (within last 365 days)     Recent Labs    04/01/24 1133  BNP 1,721.0*        ProBNP (last 3 results) Recent Labs (within last 365 days)      Recent Labs    06/03/23 1527 03/17/24 1433  PROBNP 414.0* 1,502.0*          Diagnostic Studies/Procedures    Imaging Results (Last 48 hours)  No results found.       Discharge Medications    Allergies as of 04/04/2024   No Known Allergies           Outpatient Medications Prior to Visit  Medication Sig Dispense Refill  amiodarone (PACERONE) 200 MG tablet Take 2 tablets (400 mg total) by mouth 2 (two) times daily for 14 days, THEN 1 tablet (200 mg total) daily. 146 tablet 0   apixaban  (ELIQUIS ) 5 MG TABS tablet Take 1 tablet (5 mg total) by mouth 2 (two) times daily. 180 tablet 3   atorvastatin  (LIPITOR) 80 MG tablet Take 1 tablet (80 mg total) by mouth daily. 90 tablet 3   carvedilol  (COREG ) 12.5 MG tablet Take 1 tablet (12.5 mg total) by mouth 2 (two) times daily with a meal. 180 tablet 1   cetirizine (ZYRTEC) 10 MG tablet Take 10 mg by mouth daily.     Cholecalciferol  (VITAMIN D -3 PO) Take 1 capsule by mouth in the morning and at bedtime.     digoxin  (LANOXIN ) 0.125 MG tablet TAKE 1 TABLET(125 MCG) BY MOUTH DAILY 90 tablet 3   FIASP  FLEXTOUCH 100 UNIT/ML FlexTouch Pen Inject 15-35 Units into the skin 3 (three) times daily before meals. Dose per sliding scale     fluticasone  (CUTIVATE ) 0.05 % cream Apply 1 Application  topically daily as needed (irritation).     furosemide  (LASIX ) 40 MG tablet TAKE 1 TABLET BY MOUTH THREE TIMES A WEEK, ON MONDAY, WEDNESDAY, AND FRIDAY. MAY TAKE ADDITIONAL TABLETS AS NEEDED. 180 tablet 3   levothyroxine  (SYNTHROID , LEVOTHROID) 25 MCG tablet TAKE 1 TABLET ONCE DAILY BEFORE BREAKFAST 30 tablet 0   metFORMIN  (GLUCOPHAGE -XR) 500 MG 24 hr tablet Take 500 mg by mouth daily.     PARoxetine  (PAXIL ) 30 MG tablet Take 1 tablet (30 mg total) by mouth daily. 90 tablet 1   sacubitril -valsartan  (ENTRESTO ) 49-51 MG Take 1 tablet by mouth 2 (two) times daily. 60 tablet 3   spironolactone  (ALDACTONE ) 25 MG tablet Take 1 tablet (25 mg total) by mouth daily. 30 tablet 6   tirzepatide  (MOUNJARO ) 15 MG/0.5ML Pen Inject 15 mg into the skin once a week. 2 mL 0   TRESIBA FLEXTOUCH 100 UNIT/ML FlexTouch Pen Inject 14 Units into the skin at bedtime.     No facility-administered medications prior to visit.    ROS: Review of Systems  Constitutional:  Positive for fatigue. Negative for appetite change and unexpected weight change.  HENT:  Negative for congestion, nosebleeds, sneezing, sore throat and trouble swallowing.   Eyes:  Negative for itching and visual disturbance.  Respiratory:  Negative for cough.   Cardiovascular:  Negative for chest pain, palpitations and leg swelling.  Gastrointestinal:  Negative for abdominal distention, blood in stool, diarrhea and nausea.  Genitourinary:  Negative for frequency and hematuria.  Musculoskeletal:  Negative for back pain, gait problem, joint swelling and neck pain.  Skin:  Negative for rash.  Neurological:  Negative for dizziness, tremors, speech difficulty and weakness.  Psychiatric/Behavioral:  Negative for agitation, dysphoric mood and sleep disturbance. The patient is not nervous/anxious.     Objective:  BP 104/60   Pulse 66   Temp 97.9 F (36.6 C)   Ht 6' 2 (1.88 m)   Wt 257 lb 6.4 oz (116.8 kg)   SpO2 97%   BMI 33.05 kg/m   BP Readings  from Last 3 Encounters:  04/09/24 118/70  04/07/24 104/60  04/06/24 124/78    Wt Readings from Last 3 Encounters:  04/09/24 255 lb 6.4 oz (115.8 kg)  04/07/24 257 lb 6.4 oz (116.8 kg)  04/06/24 259 lb 12.8 oz (117.8 kg)    Physical Exam Constitutional:      General: He is not in acute  distress.    Appearance: He is well-developed. He is obese.     Comments: NAD  Eyes:     Conjunctiva/sclera: Conjunctivae normal.     Pupils: Pupils are equal, round, and reactive to light.  Neck:     Thyroid : No thyromegaly.     Vascular: No JVD.  Cardiovascular:     Rate and Rhythm: Normal rate and regular rhythm.     Heart sounds: Normal heart sounds. No murmur heard.    No friction rub. No gallop.  Pulmonary:     Effort: Pulmonary effort is normal. No respiratory distress.     Breath sounds: Normal breath sounds. No wheezing or rales.  Chest:     Chest wall: No tenderness.  Abdominal:     General: Bowel sounds are normal. There is no distension.     Palpations: Abdomen is soft. There is no mass.     Tenderness: There is no abdominal tenderness. There is no guarding or rebound.  Musculoskeletal:        General: No tenderness. Normal range of motion.     Cervical back: Normal range of motion.  Lymphadenopathy:     Cervical: No cervical adenopathy.  Skin:    General: Skin is warm and dry.     Findings: No rash.  Neurological:     Mental Status: He is alert and oriented to person, place, and time.     Cranial Nerves: No cranial nerve deficit.     Motor: No abnormal muscle tone.     Coordination: Coordination normal.     Gait: Gait normal.     Deep Tendon Reflexes: Reflexes are normal and symmetric.  Psychiatric:        Behavior: Behavior normal.        Thought Content: Thought content normal.        Judgment: Judgment normal.     Lab Results  Component Value Date   WBC 6.1 04/04/2024   HGB 12.8 (L) 04/04/2024   HCT 39.2 04/04/2024   PLT 202 04/04/2024   GLUCOSE 130 (H)  04/09/2024   CHOL 152 12/17/2023   TRIG 295 (A) 12/17/2023   HDL 37 12/17/2023   LDLDIRECT 95.0 04/25/2021   LDLCALC 68 12/17/2023   ALT 27 03/17/2024   AST 22 03/17/2024   NA 140 04/09/2024   K 4.1 04/09/2024   CL 102 04/09/2024   CREATININE 1.60 (H) 04/09/2024   BUN 19 04/09/2024   CO2 28 04/09/2024   TSH 3.15 03/17/2024   PSA 1.44 01/10/2023   INR 0.9 10/27/2019   HGBA1C 8.0 12/17/2023   MICROALBUR 25.9 (H) 03/17/2024    EP STUDY Result Date: 04/04/2024 See surgical note for result.  DG Chest 2 View Result Date: 04/01/2024 EXAM: 2 VIEW(S) XRAY OF THE CHEST 04/01/2024 11:13:00 AM COMPARISON: Chest radiograph dated 05/05/2023 and 07/26/2019. CLINICAL HISTORY: SOB FINDINGS: LUNGS AND PLEURA: Similar left basilar scarring. No appreciable acute consolidation. No definite pleural effusion or pneumothorax. HEART AND MEDIASTINUM: Stable cardiomegaly. Single lead left-sided defibrillator. BONES AND SOFT TISSUES: Partially visualized left reverse shoulder arthroplasty. No acute osseous abnormality. IMPRESSION: 1. No acute cardiopulmonary findings. 2. Stable cardiomegaly. Electronically signed by: Harrietta Sherry MD 04/01/2024 12:21 PM EST RP Workstation: [id number]    Assessment & Plan:   Problem List Items Addressed This Visit     Abnormal urine   Asymptomatic.  Obtain UA and culture.  Will treat if positive culture      Relevant Orders   CULTURE, URINE  COMPREHENSIVE (Completed)   Urinalysis   Atrial fibrillation (HCC)   The plan is to continue amiodarone 400 mg twice daily for 2 weeks, then 200 mg daily thereafter.  Carvedilol  has been reduced to 12.5 mg twice a day.  He is currently scheduled for outpatient A-fib ablation by Dr. Inocencio.  During this hospitalization, the patient did have a episode of chest discomfort 04/03/2024, EKG showed no acute changes, serial troponin was borderline however remained flat inconsistent with ACS.  Symptom resolved.  Suspect GERD.       Chronic  systolic CHF (congestive heart failure) (HCC)   Follow-up with Dr. Bensimhon.  Continue with other meds.      Dietary vitamin B12 deficiency anemia - Primary   On B12      Vitamin D  deficiency disease   On Vit D         No orders of the defined types were placed in this encounter.     Follow-up: Return in about 3 months (around 07/08/2024) for f/u with PCP.  Marolyn Noel, MD

## 2024-04-07 NOTE — Assessment & Plan Note (Signed)
 On B12

## 2024-04-09 ENCOUNTER — Ambulatory Visit (HOSPITAL_COMMUNITY)
Admission: RE | Admit: 2024-04-09 | Discharge: 2024-04-09 | Disposition: A | Discharge status: Home / Self Care | Source: Ambulatory Visit | Attending: Family Medicine | Admitting: Family Medicine

## 2024-04-09 ENCOUNTER — Encounter (HOSPITAL_COMMUNITY): Payer: Self-pay

## 2024-04-09 ENCOUNTER — Ambulatory Visit (HOSPITAL_COMMUNITY): Payer: Self-pay | Admitting: Family Medicine

## 2024-04-09 ENCOUNTER — Other Ambulatory Visit (HOSPITAL_COMMUNITY): Payer: Self-pay | Admitting: Internal Medicine

## 2024-04-09 VITALS — BP 118/70 | HR 70 | Wt 255.4 lb

## 2024-04-09 DIAGNOSIS — Z85528 Personal history of other malignant neoplasm of kidney: Secondary | ICD-10-CM | POA: Insufficient documentation

## 2024-04-09 DIAGNOSIS — I2510 Atherosclerotic heart disease of native coronary artery without angina pectoris: Secondary | ICD-10-CM | POA: Diagnosis not present

## 2024-04-09 DIAGNOSIS — Z794 Long term (current) use of insulin: Secondary | ICD-10-CM | POA: Diagnosis not present

## 2024-04-09 DIAGNOSIS — E099 Drug or chemical induced diabetes mellitus without complications: Secondary | ICD-10-CM | POA: Insufficient documentation

## 2024-04-09 DIAGNOSIS — I428 Other cardiomyopathies: Secondary | ICD-10-CM | POA: Diagnosis not present

## 2024-04-09 DIAGNOSIS — I480 Paroxysmal atrial fibrillation: Secondary | ICD-10-CM | POA: Insufficient documentation

## 2024-04-09 DIAGNOSIS — Z7901 Long term (current) use of anticoagulants: Secondary | ICD-10-CM | POA: Insufficient documentation

## 2024-04-09 DIAGNOSIS — E669 Obesity, unspecified: Secondary | ICD-10-CM | POA: Diagnosis not present

## 2024-04-09 DIAGNOSIS — Z7984 Long term (current) use of oral hypoglycemic drugs: Secondary | ICD-10-CM | POA: Insufficient documentation

## 2024-04-09 DIAGNOSIS — C7800 Secondary malignant neoplasm of unspecified lung: Secondary | ICD-10-CM | POA: Diagnosis not present

## 2024-04-09 DIAGNOSIS — Z6832 Body mass index (BMI) 32.0-32.9, adult: Secondary | ICD-10-CM | POA: Diagnosis not present

## 2024-04-09 DIAGNOSIS — Z7985 Long-term (current) use of injectable non-insulin antidiabetic drugs: Secondary | ICD-10-CM | POA: Diagnosis not present

## 2024-04-09 DIAGNOSIS — I5022 Chronic systolic (congestive) heart failure: Secondary | ICD-10-CM | POA: Diagnosis not present

## 2024-04-09 DIAGNOSIS — E109 Type 1 diabetes mellitus without complications: Secondary | ICD-10-CM

## 2024-04-09 DIAGNOSIS — I10 Essential (primary) hypertension: Secondary | ICD-10-CM | POA: Diagnosis not present

## 2024-04-09 DIAGNOSIS — I110 Hypertensive heart disease with heart failure: Secondary | ICD-10-CM | POA: Insufficient documentation

## 2024-04-09 DIAGNOSIS — Z905 Acquired absence of kidney: Secondary | ICD-10-CM | POA: Insufficient documentation

## 2024-04-09 DIAGNOSIS — T451X5A Adverse effect of antineoplastic and immunosuppressive drugs, initial encounter: Secondary | ICD-10-CM | POA: Diagnosis not present

## 2024-04-09 LAB — BASIC METABOLIC PANEL WITH GFR
Anion gap: 10 (ref 5–15)
BUN: 19 mg/dL (ref 8–23)
CO2: 28 mmol/L (ref 22–32)
Calcium: 9.3 mg/dL (ref 8.9–10.3)
Chloride: 102 mmol/L (ref 98–111)
Creatinine, Ser: 1.6 mg/dL — ABNORMAL HIGH (ref 0.61–1.24)
GFR, Estimated: 48 mL/min — ABNORMAL LOW (ref >60)
Glucose, Bld: 130 mg/dL — ABNORMAL HIGH (ref 70–99)
Potassium: 4.1 mmol/L (ref 3.5–5.1)
Sodium: 140 mmol/L (ref 135–145)

## 2024-04-09 LAB — CULTURE, URINE COMPREHENSIVE

## 2024-04-09 LAB — DIGOXIN LEVEL: Digoxin Level: 0.9 ng/mL (ref 0.8–2.0)

## 2024-04-09 NOTE — Patient Instructions (Addendum)
 GOOD TO SEE YOU TODAY!  Labs done today, your results will be available in MyChart, we will contact you for abnormal readings.  Your physician recommends that you schedule a follow-up appointment as scheduled  You have been referred to cardiac rehab they will call to schedule an appointment  If you have any questions or concerns before your next appointment please send us  a message through [place] or call our office at [phone].    TO LEAVE A MESSAGE FOR THE NURSE SELECT OPTION 2, PLEASE LEAVE A MESSAGE INCLUDING: YOUR NAME DATE OF BIRTH CALL BACK NUMBER REASON FOR CALL**this is important as we prioritize the call backs  YOU WILL RECEIVE A CALL BACK THE SAME DAY AS LONG AS YOU CALL BEFORE 4:00 PM At the [facility], you and your health needs are our priority. As part of our continuing mission to provide you with exceptional heart care, we have created designated Provider Care Teams. These Care Teams include your primary Cardiologist (physician) and Advanced Practice Providers (APPs- Physician Assistants and Nurse Practitioners) who all work together to provide you with the care you need, when you need it.   You may see any of the following providers on your designated Care Team at your next follow up: Dr Toribio Fuel Dr Ezra Shuck Dr. Morene Brownie Greig Mosses, NP Caffie Shed, [place] [facility] [place], [place] Beckey Coe, NP Jordan Lee, NP Ellouise Class, NP Tinnie Redman, PharmD Jaun Bash, PharmD   Please be sure to bring in all your medications bottles to every appointment.    Thank you for choosing [facility]

## 2024-04-09 NOTE — Progress Notes (Signed)
 [facility]   PCP:  [name], MD  HF Cardiologist: Dr. Cherrie  HPI: Gregory [name] is a 63 y.o. male with h/o obesity, metastatic R renal cell CA s/p R nephrectomy in 9/17 with the subsequent discovery of 2 pulmonary nodules whom we follow for non-ischemic cardiomyopathy.   Developed microscopic hematuria and found to have large right renal mass c/w renal cell CA. Eventually underwent right radical nephrectomy with adrenal sparing on 02/07/16 at [facility]. Post-op developed bradycardia with HRs in 30s while sleeping. BP stable. When awakened had dizziness so given atropine . ECG revealed sinus brady in 40s with mild QT prolongation which resolved. Echo done and EF 45% with global HK. RV dilated  With normal function .   We saw him for the first time in October 2017. At that time we ordered cMRI and stress test. However in the interim he went back to [facility]. Found to have 2 metastatic lung nodules Echo showed EF at 46%. However MUGA with EF 28% so referred back to us  for further evaluation prior to chemo. Underwent cath 04/09/16 as below. Had chemo and lung nodule resection.    1. 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 Otherwise normal coronaries 3. Nonischemic, dilated cardiomyopathy with EF 30-35% and global hypokinesis   EF 12/21 EF 30% RV normal  Echo 12/13/19 EF 20-25%. RV normal  cMRI 05/29/16 EF 28% No LGE cMRI 2/22 EF 20% RV 20% No LGE    Had sleep study which was negative.    Echo 07/29/19 EF 10-15% Mild RV dysfunction. Underwent aggressive med titration with help of PharmDs  Echo 07/10/22: EF 20-25%. RV ok   cMRI 9/24 1. Moderately dilated LV with diffuse hypokinesis, EF 21%. 2. Normal RV szie with EF 32%. 3. No myocardial LGE, so no definitive evidence for prior MI, infiltrative disease, or myocarditis. 4. Mildly increased extracellular volume percentage, not in  cardiac amyloidosis range.  Underwent MDT ICD 12/24.  Found to be in Afib with RVR at visit with EP 10/25. Felt terrible. Seen in our clinic 03/19/24 and set up for DCCV the same day. SR restored. He was volume overloaded and diuretics were increased.  Admitted 04/01/24 from AHF clinic with a/c HF and AF. Diuresed and loaded with IV amiodarone. He underwent DCCV to NSR. EP consulted and arranged outpatient ablation. Drips weaned, GDMT titrated and he was discharged home, weight 257 lbs.  Today he returns for post hospital HF follow up with his wife. Overall feeling fine. He is not SOB with activity. Denies palpitations, abnormal bleeding, CP, dizziness, edema, or PND/Orthopnea. Appetite ok. Weight at home 249 pounds. Taking all medications, Takes Lasix  PRN. Planning AF ablation with EP soon.   Past Medical History:  Diagnosis Date   Allergy    Anemia    low iron   Anxiety    Atrial fib/flutter, transient (HCC)    Cancer (HCC)    Renal cell cancer   CHF (congestive heart failure) (HCC)    Complex tear of lateral meniscus of right knee as current injury 03/20/2017   Complex tear of medial meniscus of right knee 03/20/2017   Diabetes mellitus without complication (HCC)    History of gastrointestinal hemorrhage    Hypertension    Hypertriglyceridemia    Hypothyroidism    Macular degeneration    lasar surgery- Dr Alvia   Neuropathy    Nonischemic cardiomyopathy (HCC)  EF 25-30% by echo 09/12/16   Plantar warts    PONV (postoperative nausea and vomiting)     a little bit of nausea   Primary localized osteoarthritis of right knee 03/20/2017   Renal cell carcinoma [facility])    s/p nephrectomy 02/07/16 at [facility] [place])   Past Surgical History:  Procedure Laterality Date   CARDIAC CATHETERIZATION N/A 04/19/2016   Procedure: Left Heart Cath and Coronary Angiography;  Surgeon: Toribio JONELLE Fuel, MD;  Location: [facility] INVASIVE CV LAB;  Service: Cardiovascular;  Laterality: N/A;    CARDIOVERSION N/A 03/19/2024   Procedure: CARDIOVERSION;  Surgeon: Fuel Toribio JONELLE, MD;  Location: [facility] INVASIVE CV LAB;  Service: Cardiovascular;  Laterality: N/A;   CARDIOVERSION N/A 04/02/2024   Procedure: CARDIOVERSION;  Surgeon: Fuel Toribio JONELLE, MD;  Location: [facility] INVASIVE CV LAB;  Service: Cardiovascular;  Laterality: N/A;   CHONDROPLASTY Right 03/20/2017   Procedure: CHONDROPLASTY;  Surgeon: Josefina Chew, MD;  Location: [facility] OR;  Service: Orthopedics;  Laterality: Right;   COLONOSCOPY  03/22/2005   EYE SURGERY Right    laser surgery, right eye macular degeneration   ICD IMPLANT N/A 05/05/2023   Procedure: ICD IMPLANT;  Surgeon: Fernande Elspeth BROCKS, MD;  Location: [facility] INVASIVE CV LAB;  Service: Cardiovascular;  Laterality: N/A;   KNEE ARTHROSCOPY Right    x 2   KNEE ARTHROSCOPY WITH MEDIAL MENISECTOMY Right 03/20/2017   Procedure: KNEE ARTHROSCOPY WITH MEDIAL AND LATERAL MENISECTOMY;  Surgeon: Josefina Chew, MD;  Location: [facility] OR;  Service: Orthopedics;  Laterality: Right;   NEPHRECTOMY Right    orif fx fibula     REVERSE SHOULDER ARTHROPLASTY Left 06/20/2023   Procedure: REVERSE SHOULDER ARTHROPLASTY REVISION;  Surgeon: [name] Kemps, MD;  Location: [facility] ORS;  Service: Orthopedics;  Laterality: Left;   sebaceous cyst excision  1985   TONSILLECTOMY     TOTAL SHOULDER ARTHROPLASTY Left 11/11/2014   Procedure: LEFT TOTAL SHOULDER ARTHROPLASTY;  Surgeon: Kemps [name], MD;  Location: [facility] OR;  Service: Orthopedics;  Laterality: Left;   TOTAL SHOULDER REVISION Left 06/06/2023   Procedure: SHOULDER ARTHROPLASTY REVISION;again FEB 2025   Surgeon: [name] Kemps, MD;  Location: [facility] ORS;  Service: Orthopedics;  Laterality: Left;  with interscalene block   VASECTOMY     Current Outpatient Medications  Medication Sig Dispense Refill   amiodarone (PACERONE) 200 MG tablet Take 2 tablets (400 mg total) by mouth 2 (two) times daily for 14 days, THEN 1 tablet (200 mg total) daily. 146 tablet 0    apixaban  (ELIQUIS ) 5 MG TABS tablet Take 1 tablet (5 mg total) by mouth 2 (two) times daily. 180 tablet 3   atorvastatin  (LIPITOR) 80 MG tablet Take 1 tablet (80 mg total) by mouth daily. 90 tablet 3   carvedilol  (COREG ) 12.5 MG tablet Take 1 tablet (12.5 mg total) by mouth 2 (two) times daily with a meal. 180 tablet 1   cetirizine (ZYRTEC) 10 MG tablet Take 10 mg by mouth daily.     Cholecalciferol  (VITAMIN D -3 PO) Take 1 capsule by mouth in the morning and at bedtime.     digoxin  (LANOXIN ) 0.125 MG tablet TAKE 1 TABLET(125 MCG) BY MOUTH DAILY 90 tablet 3   FIASP  FLEXTOUCH 100 UNIT/ML FlexTouch Pen Inject 15-35 Units into the skin 3 (three) times daily before meals. Dose per sliding scale     fluticasone  (CUTIVATE ) 0.05 % cream Apply 1 Application topically daily as needed (irritation).     furosemide  (LASIX ) 40 MG tablet TAKE 1 TABLET BY  MOUTH THREE TIMES A WEEK, ON MONDAY, WEDNESDAY, AND FRIDAY. MAY TAKE ADDITIONAL TABLETS AS NEEDED. 180 tablet 3   levothyroxine  (SYNTHROID , LEVOTHROID) 25 MCG tablet TAKE 1 TABLET ONCE DAILY BEFORE BREAKFAST 30 tablet 0   metFORMIN  (GLUCOPHAGE -XR) 500 MG 24 hr tablet Take 500 mg by mouth daily.     PARoxetine  (PAXIL ) 30 MG tablet Take 1 tablet (30 mg total) by mouth daily. 90 tablet 1   sacubitril -valsartan  (ENTRESTO ) 49-51 MG Take 1 tablet by mouth 2 (two) times daily. 60 tablet 3   spironolactone  (ALDACTONE ) 25 MG tablet Take 1 tablet (25 mg total) by mouth daily. 30 tablet 6   tirzepatide  (MOUNJARO ) 15 MG/0.5ML Pen Inject 15 mg into the skin once a week. 2 mL 0   TRESIBA FLEXTOUCH 100 UNIT/ML FlexTouch Pen Inject 14 Units into the skin at bedtime.     No current facility-administered medications for this encounter.   Allergies:   Patient has no known allergies.   Social History:  The patient  reports that he has never smoked. He has never used smokeless tobacco. He reports current alcohol use. He reports that he does not use drugs.   Family History:  The  patient's family history includes Bipolar disorder in his mother; Cancer (age of onset: 30) in his mother; Hypertension in his mother; Other in his father.   ROS:  Please see the history of present illness.   All other systems are personally reviewed and negative.   Wt Readings from Last 3 Encounters:  04/09/24 115.8 kg (255 lb 6.4 oz)  04/07/24 116.8 kg (257 lb 6.4 oz)  04/06/24 117.8 kg (259 lb 12.8 oz)   BP 118/70   Pulse 70   Wt 115.8 kg (255 lb 6.4 oz)   SpO2 96%   BMI 32.79 kg/m   Physical Exam:   General:  NAD. No resp difficulty HEENT: Normal Neck: Supple. No JVD. Cor: Regular rate & rhythm. No rubs, gallops or murmurs. Lungs: Clear Abdomen: Soft, nontender, nondistended.  Extremities: No cyanosis, clubbing, rash, edema Neuro: Alert & oriented x 3, moves all 4 extremities w/o difficulty. Affect pleasant.  ECG (personally reviewed from 04/06/24): NSR 1AVB  Device interrogation (personally reviewed): OptiVol stable, thoracic impedence at reference line, 1.7 hr/day activity, no AF since DCCV, no VT  ASSESSMENT AND PLAN: 1. Chronic systolic HF due to NICM  - Unclear etiology. EF 30-35% by cath with NICM. cMRI 1/18 with EF 28%.  - Etiology remains unclear - possible due to immuno-therapy. No evidence of scar of infiltrative process on MRI.  - Echo 4/18 EF 35-40%. Echo 04/2017 EF 50-55% - Echo 3/21 EF 10-15% Mild RV - Echo 7/21 EF 20-25%. RV normal.  - Echo 12/21 EF 30%  - cMRI 2/22 EF 20% RV 20% No LGE - Echo 11/22 EF 25-30% RV ok   - Echo 2/24: EF 20-25%. RV ok  - cMRI 9/24 EF 21% RV EF 32% No LGE - s/p MDT 12/24 - NYHA II, volume stable - Continue Lasix  40 mg MWF - Continue Coreg  12.5 mg bid - Continue Entresto  49/51 mg bid   - Continue spiro 25 mg daily. - Continue digoxin  0.125 mg daily - No SGLT2i with Type I DM. - Genetic evaluation was negative - Labs today. - Refer to CR   2. Paroxysmal atrial fibrillation - recurrence 10/31 - S/p DCCV to SR  03/19/24 - s/p DCCV to NSR 11/25 - Continue amiodarone taper, per EP - Continue Eliquis  5 mg bid.  No bleeding issues - Planning AF ablation next week with EP   3. Metastatic Renal Cell CA with lung nodules - s/p nephrectomy.  - Follows at [facility].  - Felt to be in remission > 8 years    4. HTN  - BP stable  - Meds as above   5. CAD - 1v in small RCA.  - No chest pain - No AC with Eliquis  - Continue atorva 80 mg daily   6. 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 - Followed by Dr. Faythe at [place].   7. Obesity - Body mass index is 32.79 kg/m. - He is on GLP1.  Follow up in 2 months with APP.   Harlene Gainer, FNP-BC 04/09/2024 1:48 PM

## 2024-04-11 ENCOUNTER — Encounter: Payer: Self-pay | Admitting: Internal Medicine

## 2024-04-11 DIAGNOSIS — R8290 Unspecified abnormal findings in urine: Secondary | ICD-10-CM | POA: Insufficient documentation

## 2024-04-11 NOTE — Assessment & Plan Note (Signed)
 The plan is to continue amiodarone 400 mg twice daily for 2 weeks, then 200 mg daily thereafter.  Carvedilol  has been reduced to 12.5 mg twice a day.  He is currently scheduled for outpatient A-fib ablation by Dr. Inocencio.  During this hospitalization, the patient did have a episode of chest discomfort 04/03/2024, EKG showed no acute changes, serial troponin was borderline however remained flat inconsistent with ACS.  Symptom resolved.  Suspect GERD.

## 2024-04-11 NOTE — Assessment & Plan Note (Signed)
 Follow-up with Dr. Bensimhon.  Continue with other meds.

## 2024-04-11 NOTE — Assessment & Plan Note (Signed)
 Asymptomatic.  Obtain UA and culture.  Will treat if positive culture

## 2024-04-13 ENCOUNTER — Ambulatory Visit (HOSPITAL_COMMUNITY)

## 2024-04-13 ENCOUNTER — Encounter (HOSPITAL_COMMUNITY): Payer: Self-pay

## 2024-04-15 ENCOUNTER — Encounter: Payer: Self-pay | Admitting: Family Medicine

## 2024-04-17 ENCOUNTER — Other Ambulatory Visit (HOSPITAL_COMMUNITY): Payer: Self-pay | Admitting: Internal Medicine

## 2024-04-19 DIAGNOSIS — E039 Hypothyroidism, unspecified: Secondary | ICD-10-CM | POA: Diagnosis not present

## 2024-04-19 DIAGNOSIS — Z794 Long term (current) use of insulin: Secondary | ICD-10-CM | POA: Diagnosis not present

## 2024-04-19 DIAGNOSIS — E349 Endocrine disorder, unspecified: Secondary | ICD-10-CM | POA: Diagnosis not present

## 2024-04-19 DIAGNOSIS — E1165 Type 2 diabetes mellitus with hyperglycemia: Secondary | ICD-10-CM | POA: Diagnosis not present

## 2024-04-19 NOTE — Pre-Procedure Instructions (Addendum)
 Instructed patient on the following items: Arrival time 0515 Nothing to eat or drink after midnight No meds AM of procedure Responsible person to drive you home and stay with you for 24 hrs  Have you missed any doses of anti-coagulant Elqiuis- takes twice a day, hasn't missed any doses in last 4 weeks.  Don't take dose morning of procedure.  Pt last took Mounjaro  11/12.

## 2024-04-20 ENCOUNTER — Other Ambulatory Visit: Payer: Self-pay

## 2024-04-20 ENCOUNTER — Ambulatory Visit (HOSPITAL_COMMUNITY)
Admission: RE | Disposition: A | Discharge status: Home / Self Care | Payer: Self-pay | Source: Home / Self Care | Attending: Cardiology

## 2024-04-20 ENCOUNTER — Ambulatory Visit (HOSPITAL_COMMUNITY): Admitting: Anesthesiology

## 2024-04-20 ENCOUNTER — Ambulatory Visit (HOSPITAL_COMMUNITY)
Admission: RE | Admit: 2024-04-20 | Discharge: 2024-04-20 | Disposition: A | Discharge status: Home / Self Care | Attending: Cardiology | Admitting: Cardiology

## 2024-04-20 DIAGNOSIS — I5022 Chronic systolic (congestive) heart failure: Secondary | ICD-10-CM | POA: Insufficient documentation

## 2024-04-20 DIAGNOSIS — I4891 Unspecified atrial fibrillation: Secondary | ICD-10-CM | POA: Diagnosis not present

## 2024-04-20 DIAGNOSIS — Z95810 Presence of automatic (implantable) cardiac defibrillator: Secondary | ICD-10-CM | POA: Diagnosis not present

## 2024-04-20 DIAGNOSIS — I428 Other cardiomyopathies: Secondary | ICD-10-CM | POA: Insufficient documentation

## 2024-04-20 DIAGNOSIS — I4819 Other persistent atrial fibrillation: Secondary | ICD-10-CM

## 2024-04-20 DIAGNOSIS — Z79899 Other long term (current) drug therapy: Secondary | ICD-10-CM | POA: Insufficient documentation

## 2024-04-20 DIAGNOSIS — I110 Hypertensive heart disease with heart failure: Secondary | ICD-10-CM | POA: Diagnosis not present

## 2024-04-20 HISTORY — PX: ATRIAL FIBRILLATION ABLATION: EP1191

## 2024-04-20 LAB — GLUCOSE, CAPILLARY
Glucose-Capillary: 289 mg/dL — ABNORMAL HIGH (ref 70–99)
Glucose-Capillary: 192 mg/dL — ABNORMAL HIGH (ref 70–99)

## 2024-04-20 LAB — POCT ACTIVATED CLOTTING TIME
Activated Clotting Time: 222 s
Activated Clotting Time: 354 s

## 2024-04-20 SURGERY — ATRIAL FIBRILLATION ABLATION
Anesthesia: General

## 2024-04-20 MED ORDER — DROPERIDOL 2.5 MG/ML IJ SOLN: 0.6250 mg | Freq: Once | INTRAMUSCULAR | Status: DC | PRN

## 2024-04-20 MED ORDER — FENTANYL CITRATE (PF) 250 MCG/5ML IJ SOLN
INTRAMUSCULAR | Status: DC | PRN
  Administered 2024-04-20: 100 ug via INTRAVENOUS

## 2024-04-20 MED ORDER — HEPARIN (PORCINE) IN NACL 1000-0.9 UT/500ML-% IV SOLN
INTRAVENOUS | Status: DC | PRN
  Administered 2024-04-20 (×2): 500 mL

## 2024-04-20 MED ORDER — PHENYLEPHRINE HCL-NACL 20-0.9 MG/250ML-% IV SOLN
INTRAVENOUS | Status: DC | PRN
  Administered 2024-04-20: 50 ug/min via INTRAVENOUS

## 2024-04-20 MED ORDER — ONDANSETRON HCL 4 MG/2ML IJ SOLN
INTRAMUSCULAR | Status: DC | PRN
  Administered 2024-04-20: 4 mg via INTRAVENOUS

## 2024-04-20 MED ORDER — LIDOCAINE 2% (20 MG/ML) 5 ML SYRINGE
INTRAMUSCULAR | Status: DC | PRN
  Administered 2024-04-20: 80 mg via INTRAVENOUS

## 2024-04-20 MED ORDER — HEPARIN SODIUM (PORCINE) 1000 UNIT/ML IJ SOLN
INTRAMUSCULAR | Status: DC | PRN
  Administered 2024-04-20: 1000 [IU] via INTRAVENOUS

## 2024-04-20 MED ORDER — PROTAMINE SULFATE 10 MG/ML IV SOLN
INTRAVENOUS | Status: DC | PRN
  Administered 2024-04-20: 40 mg via INTRAVENOUS

## 2024-04-20 MED ORDER — PROPOFOL 10 MG/ML IV BOLUS
INTRAVENOUS | Status: DC | PRN
  Administered 2024-04-20: 130 mg via INTRAVENOUS

## 2024-04-20 MED ORDER — CEFAZOLIN SODIUM-DEXTROSE 2-3 GM-%(50ML) IV SOLR
INTRAVENOUS | Status: DC | PRN
  Administered 2024-04-20: 2 g via INTRAVENOUS

## 2024-04-20 MED ORDER — SODIUM CHLORIDE 0.9% FLUSH: 3.0000 mL | INTRAVENOUS | Status: DC | PRN

## 2024-04-20 MED ORDER — SODIUM CHLORIDE 0.9 % IV SOLN: 250.0000 mL | INTRAVENOUS | Status: DC | PRN

## 2024-04-20 MED ORDER — ONDANSETRON HCL 4 MG/2ML IJ SOLN: 4.0000 mg | Freq: Four times a day (QID) | INTRAMUSCULAR | Status: DC | PRN

## 2024-04-20 MED ORDER — SUGAMMADEX SODIUM 200 MG/2ML IV SOLN
INTRAVENOUS | Status: DC | PRN
  Administered 2024-04-20: 400 mg via INTRAVENOUS

## 2024-04-20 MED ORDER — SODIUM CHLORIDE 0.9 % IV SOLN: INTRAVENOUS | Status: DC | PRN

## 2024-04-20 MED ORDER — PHENYLEPHRINE HCL (PRESSORS) 10 MG/ML IV SOLN
INTRAVENOUS | Status: DC | PRN
  Administered 2024-04-20 (×3): 80 ug via INTRAVENOUS

## 2024-04-20 MED ORDER — SODIUM CHLORIDE 0.9 % IV SOLN: INTRAVENOUS | Status: DC

## 2024-04-20 MED ORDER — HEPARIN SODIUM (PORCINE) 1000 UNIT/ML IJ SOLN
INTRAMUSCULAR | Status: DC | PRN
  Administered 2024-04-20: 16000 [IU] via INTRAVENOUS
  Administered 2024-04-20: 1000 [IU] via INTRAVENOUS
  Administered 2024-04-20: 9000 [IU] via INTRAVENOUS

## 2024-04-20 MED ORDER — SODIUM CHLORIDE 0.9% FLUSH: 3.0000 mL | Freq: Two times a day (BID) | INTRAVENOUS | Status: DC

## 2024-04-20 MED ORDER — CEFAZOLIN SODIUM-DEXTROSE 2-4 GM/100ML-% IV SOLN
INTRAVENOUS | Status: AC
  Filled 2024-04-20: qty 100

## 2024-04-20 MED ORDER — EPHEDRINE SULFATE-NACL 50-0.9 MG/10ML-% IV SOSY
PREFILLED_SYRINGE | INTRAVENOUS | Status: DC | PRN
  Administered 2024-04-20 (×2): 5 mg via INTRAVENOUS

## 2024-04-20 MED ORDER — ACETAMINOPHEN 10 MG/ML IV SOLN: 1000.0000 mg | Freq: Once | INTRAVENOUS | Status: DC | PRN

## 2024-04-20 MED ORDER — ROCURONIUM BROMIDE 10 MG/ML (PF) SYRINGE
PREFILLED_SYRINGE | INTRAVENOUS | Status: DC | PRN
  Administered 2024-04-20: 60 mg via INTRAVENOUS
  Administered 2024-04-20: 20 mg via INTRAVENOUS

## 2024-04-20 MED ORDER — ACETAMINOPHEN 325 MG PO TABS: 650.0000 mg | ORAL_TABLET | ORAL | Status: DC | PRN

## 2024-04-20 MED ORDER — FENTANYL CITRATE (PF) 100 MCG/2ML IJ SOLN: 25.0000 ug | INTRAMUSCULAR | Status: DC | PRN

## 2024-04-20 MED ORDER — INSULIN ASPART 100 UNIT/ML IJ SOLN
7.0000 [IU] | Freq: Once | INTRAMUSCULAR | Status: AC
  Administered 2024-04-20: 7 [IU] via SUBCUTANEOUS
  Filled 2024-04-20: qty 7

## 2024-04-20 MED ORDER — FENTANYL CITRATE (PF) 100 MCG/2ML IJ SOLN
INTRAMUSCULAR | Status: AC
  Filled 2024-04-20: qty 2

## 2024-04-20 SURGICAL SUPPLY — 16 items
CABLE VARIPULSE STERILE (CATHETERS) IMPLANT
CATH DECANAV D CURVE (CATHETERS) IMPLANT
CATH GE 8FR SOUNDSTAR (CATHETERS) IMPLANT
CATHETER VARIPULSE 8.5FR (CATHETERS) IMPLANT
CLOSURE MYNX CONTROL 6F/7F (Vascular Products) IMPLANT
CLOSURE PERCLOSE PROSTYLE (Vascular Products) IMPLANT
COVER SWIFTLINK CONNECTOR (BAG) ×2 IMPLANT
KIT VERSACROSS 8.5F 63 45D 180 (KITS) IMPLANT
PACK EP LF (CUSTOM PROCEDURE TRAY) ×2 IMPLANT
PAD DEFIB RADIO PHYSIO CONN (PAD) ×2 IMPLANT
PATCH CARTO3 (PAD) IMPLANT
SHEATH CARTO VIZIGO SM CVD (SHEATH) IMPLANT
SHEATH PINNACLE 8F 10CM (SHEATH) IMPLANT
SHEATH PINNACLE 9F 10CM (SHEATH) IMPLANT
SHEATH PROBE COVER 6X72 (BAG) IMPLANT
TUBING SMART ABLATE COOLFLOW (TUBING) IMPLANT

## 2024-04-20 NOTE — Anesthesia Preprocedure Evaluation (Addendum)
 Anesthesia Evaluation  Patient identified by MRN, date of birth, ID band Patient awake    Reviewed: Allergy & Precautions, NPO status , Patient's Chart, lab work & pertinent test results  History of Anesthesia Complications (+) PONV and history of anesthetic complications  Airway Mallampati: II  TM Distance: >3 FB Neck ROM: Full    Dental no notable dental hx.    Pulmonary neg pulmonary ROS   Pulmonary exam normal        Cardiovascular hypertension, +CHF  + dysrhythmias Atrial Fibrillation + Cardiac Defibrillator  Rhythm:Irregular Rate:Normal  ECHO 2024:  IMPRESSIONS   1. Left ventricular ejection fraction, by estimation, is 20 to 25%. The  left ventricle has severely decreased function. The left ventricle  demonstrates global hypokinesis. The left ventricular internal cavity size  was mildly to moderately dilated. Left  ventricular diastolic parameters are consistent with Grade I diastolic  dysfunction (impaired relaxation).   2. Right ventricular systolic function is normal. The right ventricular  size is normal.   3. Left atrial size was moderately dilated.   4. The mitral valve is grossly normal. Trivial mitral valve  regurgitation. No evidence of mitral stenosis.   5. The aortic valve is tricuspid. There is mild calcification of the  aortic valve. Aortic valve regurgitation is trivial. No aortic stenosis is  present.   6. Aortic dilatation noted. There is borderline dilatation of the aortic  root, measuring 38 mm.   7. The inferior vena cava is normal in size with greater than 50%  respiratory variability, suggesting right atrial pressure of 3 mmHg.      Neuro/Psych   Anxiety     negative neurological ROS     GI/Hepatic negative GI ROS, Neg liver ROS,,,  Endo/Other  diabetesHypothyroidism    Renal/GU   negative genitourinary   Musculoskeletal  (+) Arthritis , Osteoarthritis,    Abdominal Normal  abdominal exam  (+)   Peds  Hematology  (+) Blood dyscrasia, anemia Lab Results      Component                Value               Date                      WBC                      6.1                 04/04/2024                HGB                      12.8 (L)            04/04/2024                HCT                      39.2                04/04/2024                MCV                      87.5                04/04/2024  PLT                      202                 04/04/2024              Anesthesia Other Findings   Reproductive/Obstetrics                              Anesthesia Physical Anesthesia Plan  ASA: 4  Anesthesia Plan: General   Post-op Pain Management:    Induction: Intravenous  PONV Risk Score and Plan: 4 or greater and Ondansetron , Dexamethasone , Midazolam  and Treatment may vary due to age or medical condition  Airway Management Planned: Mask and Oral ETT  Additional Equipment: ClearSight  Intra-op Plan:   Post-operative Plan: Extubation in OR  Informed Consent: I have reviewed the patients History and Physical, chart, labs and discussed the procedure including the risks, benefits and alternatives for the proposed anesthesia with the patient or authorized representative who has indicated his/her understanding and acceptance.     Dental advisory given  Plan Discussed with: CRNA  Anesthesia Plan Comments:          Anesthesia Quick Evaluation

## 2024-04-20 NOTE — Transfer of Care (Signed)
 Immediate Anesthesia Transfer of Care Note  Patient: Gregory [name]  Procedure(s) Performed: ATRIAL FIBRILLATION ABLATION  Patient Location: Cath Lab  Anesthesia Type:General  Level of Consciousness: awake, alert , oriented, and patient cooperative  Airway & Oxygen Therapy: Patient Spontanous Breathing and Patient connected to face mask oxygen  Post-op Assessment: Report given to RN, Post -op Vital signs reviewed and stable, Patient moving all extremities, and Patient moving all extremities X 4  Post vital signs: Reviewed and stable  Last Vitals:  Vitals Value Taken Time  BP 123/84 04/20/24  09:17  Temp 36.5 C 04/20/24 09:17  Pulse 67 04/20/24 09:18  Resp 14 04/20/24 09:18  SpO2 97 % 04/20/24 09:18  Vitals shown include unfiled device data.  Last Pain:  Vitals:   04/20/24 0917  TempSrc: Axillary  PainSc: 0-No pain         Complications: There were no known notable events for this encounter.

## 2024-04-20 NOTE — Interval H&P Note (Signed)
 History and Physical Interval Note:  04/20/2024 7:12 AM  Gregory [name]  has presented today for surgery, with the diagnosis of afib.  The various methods of treatment have been discussed with the patient and family. After consideration of risks, benefits and other options for treatment, the patient has consented to  Procedure(s): ATRIAL FIBRILLATION ABLATION (N/A) as a surgical intervention.  The patient's history has been reviewed, patient examined, no change in status, stable for surgery.  I have reviewed the patient's chart and labs.  Questions were answered to the patient's satisfaction.     Elizer Bostic [employer]

## 2024-04-20 NOTE — Progress Notes (Signed)
 Patient ambulated to the bathroom and was able to void. Patient ambulated in the hallway without difficulty.Bilateral groin sites dressings clean, dry, and intact. No bleeding or hematoma noted. Dose of Amiodarone  200 mg clarified by Dr. Inocencio. Patient to rake 1 tablet daily. Patient and wife notified,no concerns voiced.

## 2024-04-20 NOTE — Discharge Instructions (Signed)

## 2024-04-20 NOTE — Anesthesia Procedure Notes (Signed)
 Procedure Name: Intubation Date/Time: 04/20/2024 7:52 AM  Performed by: Arvell Edsel HERO, CRNAPre-anesthesia Checklist: Patient identified, Emergency Drugs available, Suction available, Patient being monitored and Timeout performed Patient Re-evaluated:Patient Re-evaluated prior to induction Oxygen Delivery Method: Circle system utilized Preoxygenation: Pre-oxygenation with 100% oxygen Induction Type: IV induction Ventilation: Mask ventilation without difficulty Laryngoscope Size: Glidescope and 4 Grade View: Grade I Tube type: Oral Tube size: 7.0 mm Number of attempts: 1 Airway Equipment and Method: Patient positioned with wedge pillow, Video-laryngoscopy and Stylet Placement Confirmation: ETT inserted through vocal cords under direct vision, positive ETCO2 and breath sounds checked- equal and bilateral Secured at: 23 cm Tube secured with: Tape Dental Injury: Teeth and Oropharynx as per pre-operative assessment

## 2024-04-21 ENCOUNTER — Telehealth (HOSPITAL_COMMUNITY): Payer: Self-pay

## 2024-04-21 ENCOUNTER — Encounter (HOSPITAL_COMMUNITY): Payer: Self-pay | Admitting: Cardiology

## 2024-04-21 MED FILL — Cefazolin Sodium-Dextrose IV Solution 2 GM/100ML-4%: INTRAVENOUS | Qty: 100 | Status: AC

## 2024-04-21 MED FILL — Fentanyl Citrate Preservative Free (PF) Inj 100 MCG/2ML: INTRAMUSCULAR | Qty: 2 | Status: AC

## 2024-04-21 NOTE — Telephone Encounter (Signed)
 Spoke with patient to complete post procedure follow up call.  Patient reports no complications with groin sites.   Instructions reviewed with patient:  Remove large bandage at puncture site after 24 hours. It is normal to have bruising, tenderness, mild swelling, and a pea or marble sized lump/knot at the groin site which can take up to three months to resolve.  Get help right away if you notice sudden swelling at the puncture site.  Check your puncture site every day for signs of infection: fever, redness, swelling, pus drainage, warmth, foul odor or excessive pain. If this occurs, please call [phone], to speak with the RN Navigator. Get help right away if your puncture site is bleeding and the bleeding does not stop after applying firm pressure to the area.  You may continue to have skipped beats/ atrial fibrillation during the first several months after your procedure.  It is very important not to miss any doses of your blood thinner Eliquis .    You will follow up with the Afib clinic 4 weeks after your procedure and follow up with the Afib clinic 3 months after your procedure.  Activity restrictions reviewed.  Patient verbalized understanding to all instructions provided.

## 2024-04-22 NOTE — Anesthesia Postprocedure Evaluation (Signed)
 Anesthesia Post Note  Patient: Gregory [name]  Procedure(s) Performed: ATRIAL FIBRILLATION ABLATION     Patient location during evaluation: PACU Anesthesia Type: General Level of consciousness: awake and alert Pain management: pain level controlled Vital Signs Assessment: post-procedure vital signs reviewed and stable Respiratory status: spontaneous breathing, nonlabored ventilation, respiratory function stable and patient connected to nasal cannula oxygen Cardiovascular status: blood pressure returned to baseline and stable Postop Assessment: no apparent nausea or vomiting Anesthetic complications: no   There were no known notable events for this encounter.  Last Vitals:  Vitals:   04/20/24 1200 04/20/24 1300  BP: 118/83 113/77  Pulse: 61   Resp: 12 15  Temp:    SpO2: 94%     Last Pain:  Vitals:   04/20/24 1018  TempSrc:   PainSc: 0-No pain                 Cordella P Faten [employer]

## 2024-04-26 ENCOUNTER — Encounter: Payer: Self-pay | Admitting: Dietician

## 2024-04-26 ENCOUNTER — Encounter: Admitting: Dietician

## 2024-04-26 VITALS — Ht 74.0 in | Wt 253.0 lb

## 2024-04-26 DIAGNOSIS — Z794 Long term (current) use of insulin: Secondary | ICD-10-CM | POA: Diagnosis present

## 2024-04-26 DIAGNOSIS — E1165 Type 2 diabetes mellitus with hyperglycemia: Secondary | ICD-10-CM | POA: Diagnosis present

## 2024-04-26 NOTE — Patient Instructions (Addendum)
 Water  - make a habit  Take your Fiasp  before you eat  Increase your [place] to 16 units and increase 2 units every 3 days until fasting sensor reading is 90-130 without lows.   Mindfulness:  Consistently scheduled meal - avoid skipping  Choices  Eat slowly  Away from distraction (sitting in kitchen or dining room)  Stop eating when satisfied  Before a snack ask, Am I hungry or eating for another reason?   What can I do instead if I am not hungry?  Try to find something every day that brings you joy!  Apps: Calorie Cytogeneticist

## 2024-04-26 NOTE — Progress Notes (Signed)
 Diabetes Self-Management Education  Visit Type: First/Initial  Appt. Start Time: 1030 Appt. End Time: 1145  04/26/2024  [name], identified by name and date of birth, is a 63 y.o. male with a diagnosis of Diabetes: Type 1.   ASSESSMENT  Patient is here today with his wife.  He has recently had a cardioversion and cardiac ablation.  He was last seen by another RD on 07/24/2022. They attended a Diabetes Support group meeting about 5 years ago.  His wife gets the emails but patient does not desire to go at this time. He does feel ready to discuss insulin  pumps.  Showed him the Tandem and Omnipod options as well as discussed the Medtronic pump.  Patient likes the tubeless feature.  He stated to get the Omnipod ordered as he would like to do this before the end of the year for insurance purposes.  Message sent to Dr. Faythe to order the pump, pods (change q 2 days), compatible CGM and meal time insulin  for pump use (TDD 100 units).  Messaged Omnipod rep.  Referral:  Type 2 Diabetes including DSME/MNT and patient wishes to discuss an insulin  pump, Insulin  adjustment - Dr Faythe.  History includes:  Type 2 Diabetes (2018 secondary to immuno therapy from kidney cancer), nephrectomy, CHF with EF 35%, hypothyroidism, A-fib hx resolved with cardio ablation, hypothyroidism, automatic defibrillator,  levothyroxine , HLD, HTN, CKD Medications include:  Lasix  (if weight is up), spironolactone , entresto , lipitor , amiodarone , lanoxin , carvedilol , Eliquis , vitamin D  Fiasp  per sliding scale (forgets and takes it after a meal most often) - 180 to 24 with meals, Tresiba 14 units q HS, Mounjaro  Labs:  A1C 7.9 % 02/2024, 8% 12/17/2023, c-peptide 1 (01/24/2017), TSH 3.15 on 03/17/2024 Cholesterol 152, HDL 37, LDL 68, Triglycerides 295 2/76/7974 CGM:  Libre 3 plus Fasting this am was 350,   CGM Results from download:   % Time CGM active:    %   (Goal >70%)  Average glucose:    mg/dL for  days  Glucose  management indicator:   8.6 %  Time in range (70-180 mg/dL):   64%   (Goal >29%)  Time High (181-250 mg/dL):    %   (Goal < 74%)  Time Very High (>250 mg/dL):    45 %   (Goal < 5%)  Time Low (54-69 mg/dL):    %   (Goal <5%)  Time Very Low (<54 mg/dL):    %   (Goal <8%)  %CV (glucose variability)     %  (Goal <36%)    74 253 lbs 04/26/2024 266 lbs 02/2024 275 lbs highest adult weight 200 lbs (2018) - kidney cancer  Patient lives with her wife and youngest son.  His wife does the shopping and cooking. He is self enjoyed in airline pilot.  Increased amounts of travel for work. He does his own yard work.  Not exercising over concerns of his heart.  Considering the Cardiac Rehab. Height 6' 2 (1.88 m), weight 253 lb (114.8 kg). Body mass index is 32.48 kg/m.   Diabetes Self-Management Education - 04/25/24 1149       Visit Information   Visit Type First/Initial      Initial Visit   Diabetes Type Type 1      Psychosocial Assessment   What is the hardest part about your diabetes right now, causing you the most concern, or is the most worrisome to you about your diabetes?   Taking/obtaining medications;Being active    Self-care barriers  None    Self-management support Doctor's office;Family    Other persons present Patient    Patient Concerns Nutrition/Meal planning;Healthy Lifestyle;Problem Solving;Glycemic Control;Medication;Support    Special Needs None    Preferred Learning Style No preference indicated    Learning Readiness Ready    How often do you need to have someone help you when you read instructions, pamphlets, or other written materials from your doctor or pharmacy? 1 - Never      Pre-Education Assessment   Patient understands the diabetes disease and treatment process. Needs Review    Patient understands incorporating nutritional management into lifestyle. Needs Review    Patient undertands incorporating physical activity into lifestyle. Needs Review    Patient understands  using medications safely. Needs Review    Patient understands monitoring blood glucose, interpreting and using results Needs Review    Patient understands prevention, detection, and treatment of acute complications. Needs Review    Patient understands prevention, detection, and treatment of chronic complications. Needs Review    Patient understands how to develop strategies to address psychosocial issues. Needs Review    Patient understands how to develop strategies to promote health/change behavior. Needs Review      Complications   Last HgB A1C per patient/outside source 7.9 %   02/2024   How often do you check your blood sugar? > 4 times/day    Fasting Blood glucose range (mg/dL) >799;819-799;869-820    Postprandial Blood glucose range (mg/dL) 819-799;>799    Number of hypoglycemic episodes per month 1    Can you tell when your blood sugar is low? Yes    What do you do if your blood sugar is low? drinks juice      Dietary Intake   Breakfast eggs, turkey OR bagel and cream cheese OR cinnamon roll or coffee cake OR nonfat yogurt with nuts and fruit OR english muffin, butter and jelly (no sugar)    Snack (morning) none    Lunch late breakfast yesterday    Snack (afternoon) none    Dinner appetizers    Snack (evening) ice cream    Beverage(s) water  (8 oz)      Activity / Exercise   Activity / Exercise Type ADL's   secondary to heart.     Patient Education   Disease Pathophysiology Definition of diabetes, type 1 and 2, and the diagnosis of diabetes;Factors that contribute to the development of diabetes;Explored patient's options for treatment of their diabetes    Healthy Eating Carbohydrate counting;Meal options for control of blood glucose level and chronic complications.    Being Active Role of exercise on diabetes management, blood pressure control and cardiac health.    Medications Reviewed patients medication for diabetes, action, purpose, timing of dose and side effects.     Monitoring Taught/evaluated CGM (comment)    Chronic complications Relationship between chronic complications and blood glucose control    Diabetes Stress and Support Identified and addressed patients feelings and concerns about diabetes;Worked with patient to identify barriers to care and solutions;Role of stress on diabetes      Individualized Goals (developed by patient)   Nutrition Carb counting    Physical Activity Exercise 5-7 days per week;30 minutes per day    Medications take my medication as prescribed    Monitoring  Consistenly use CGM    Problem Solving Eating Pattern;Addressing barriers to behavior change    Reducing Risk examine blood glucose patterns;do foot checks daily;treat hypoglycemia with 15 grams of carbs if blood glucose  less than 70mg /dL      Post-Education Assessment   Patient understands the diabetes disease and treatment process. Demonstrates understanding / competency    Patient understands incorporating nutritional management into lifestyle. Comprehends key points    Patient undertands incorporating physical activity into lifestyle. Comprehends key points    Patient understands using medications safely. Comphrehends key points    Patient understands monitoring blood glucose, interpreting and using results Comprehends key points    Patient understands prevention, detection, and treatment of acute complications. Comprehends key points    Patient understands prevention, detection, and treatment of chronic complications. Comprehends key points    Patient understands how to develop strategies to address psychosocial issues. Comprehends key points    Patient understands how to develop strategies to promote health/change behavior. Comprehends key points      Outcomes   Expected Outcomes Demonstrated interest in learning. Expect positive outcomes    Future DMSE PRN    Program Status Completed          Individualized Plan for Diabetes Self-Management Training:    Learning Objective:  Patient will have a greater understanding of diabetes self-management. Patient education plan is to attend individual and/or group sessions per assessed needs and concerns.   Plan:   Patient Instructions  Water  - make a habit  Take your Fiasp  before you eat  Increase your [place] to 16 units and increase 2 units every 3 days until fasting sensor reading is 90-130 without lows.   Mindfulness:  Consistently scheduled meal - avoid skipping  Choices  Eat slowly  Away from distraction (sitting in kitchen or dining room)  Stop eating when satisfied  Before a snack ask, Am I hungry or eating for another reason?   What can I do instead if I am not hungry?  Try to find something every day that brings you joy!  Apps: Calorie Myrna Freeman [place] Aniceto     Expected Outcomes:  Demonstrated interest in learning. Expect positive outcomes  Education material provided: ADA - How to Thrive: A Guide for Your Journey with Diabetes, Food label handouts, Meal plan card, Snack sheet, and Diabetes Resources  If problems or questions, patient to contact team via:  Phone  Future DSME appointment: PRN

## 2024-04-27 ENCOUNTER — Ambulatory Visit: Payer: Self-pay | Admitting: *Deleted

## 2024-04-27 ENCOUNTER — Ambulatory Visit: Admitting: Internal Medicine

## 2024-04-27 NOTE — Telephone Encounter (Signed)
 Copied from CRM #[id number]. Topic: Clinical - Red Word Triage >> Apr 27, 2024  8:52 AM Harlene ORN wrote: Red Word that prompted transfer to Nurse Triage: Patient missed his appointment today because of his low blood sugar: 54-58  Patient no longer on line at transfer- attempted to call patient back- no answer- left message to call office.

## 2024-04-27 NOTE — Telephone Encounter (Signed)
 FYI Only or Action Required?: FYI only for provider: Home Care Advised. Declined to schedule follow up visit.  Patient was last seen in primary care on 04/07/2024 by Plotnikov, Karlynn GAILS, MD.  Called Nurse Triage reporting Blood Sugar Problem.  Symptoms began today.  Interventions attempted: Dietary changes.  Symptoms are: rapidly improving.  Triage Disposition: Home Care  Patient/caregiver understands and will follow disposition?:     Reason for Disposition  [1] Blood glucose 70 mg/dL (3.9 mmol/L) or below, OR symptomatic AND [2] cause known  Answer Assessment - Initial Assessment Questions Additional info: Patient called in for missed appointment this morning, he states this appointment should have been cancelled as he already had a post hospital follow up with Dr. Garald on 04/07/24. He declines need to reschedule.     1. SYMPTOMS: What symptoms are you concerned about?     No symptoms 2. ONSET:  When did the symptoms start?     today 3. BLOOD GLUCOSE: What is your blood glucose level?      Was 54, ate now 68 4. USUAL RANGE: What is your blood glucose level usually? (e.g., usual fasting morning value, usual evening value)      5. TYPE 1 or 2:  Do you know what type of diabetes you have?  (e.g., Type 1, Type 2, Gestational; doesn't know)       6. INSULIN : Do you take insulin ? What type of insulin (s) do you use? What is the mode of delivery? (syringe, pen; injection or pump) When did you last give yourself an insulin  dose? (i.e., time or hours/minutes ago) How much did you give? (i.e., how many units)      7. DIABETES PILLS: Do you take any pills for your diabetes? If Yes, ask: What is the name of the medicine(s) that you take for high blood sugar?     As prescribed  8. OTHER SYMPTOMS: Do you have any symptoms? (e.g., fever, frequent urination, difficulty breathing, vomiting)     Denies  9. LOW BLOOD GLUCOSE TREATMENT: What have you done so far  to treat the low blood glucose level?     Ate breakfast 10. FOOD: When did you last eat or drink?        11. ALONE: Are you alone right now or is someone with you?         12. PREGNANCY: Is there any chance you are pregnant? When was your last menstrual period?  Protocols used: Diabetes - Low Blood Sugar-A-AH

## 2024-04-28 NOTE — Telephone Encounter (Signed)
 Noted

## 2024-04-30 ENCOUNTER — Telehealth (HOSPITAL_COMMUNITY): Payer: Self-pay | Admitting: Cardiology

## 2024-04-30 NOTE — Telephone Encounter (Signed)
 Per cardiac rehab Current order needs MD order New referral placed

## 2024-05-03 ENCOUNTER — Encounter: Payer: Self-pay | Admitting: Internal Medicine

## 2024-05-05 ENCOUNTER — Ambulatory Visit: Payer: BC Managed Care – PPO

## 2024-05-05 DIAGNOSIS — I428 Other cardiomyopathies: Secondary | ICD-10-CM | POA: Diagnosis not present

## 2024-05-06 ENCOUNTER — Telehealth (HOSPITAL_COMMUNITY): Payer: Self-pay

## 2024-05-06 ENCOUNTER — Ambulatory Visit (HOSPITAL_COMMUNITY)
Admission: RE | Admit: 2024-05-06 | Discharge: 2024-05-06 | Disposition: A | Discharge status: Home / Self Care | Source: Ambulatory Visit | Attending: Internal Medicine | Admitting: Internal Medicine

## 2024-05-06 ENCOUNTER — Ambulatory Visit (HOSPITAL_COMMUNITY): Admission: RE | Admit: 2024-05-06 | Discharge: 2024-05-06 | Attending: Internal Medicine | Admitting: Internal Medicine

## 2024-05-06 ENCOUNTER — Encounter (HOSPITAL_COMMUNITY): Payer: Self-pay | Admitting: Internal Medicine

## 2024-05-06 ENCOUNTER — Encounter (HOSPITAL_COMMUNITY): Payer: Self-pay

## 2024-05-06 VITALS — BP 120/84 | HR 64 | Wt 254.0 lb

## 2024-05-06 DIAGNOSIS — I2510 Atherosclerotic heart disease of native coronary artery without angina pectoris: Secondary | ICD-10-CM | POA: Diagnosis not present

## 2024-05-06 DIAGNOSIS — Z7984 Long term (current) use of oral hypoglycemic drugs: Secondary | ICD-10-CM | POA: Diagnosis not present

## 2024-05-06 DIAGNOSIS — I480 Paroxysmal atrial fibrillation: Secondary | ICD-10-CM

## 2024-05-06 DIAGNOSIS — N35811 Other urethral stricture, male, meatal: Secondary | ICD-10-CM | POA: Diagnosis not present

## 2024-05-06 DIAGNOSIS — Z95810 Presence of automatic (implantable) cardiac defibrillator: Secondary | ICD-10-CM | POA: Diagnosis not present

## 2024-05-06 DIAGNOSIS — E099 Drug or chemical induced diabetes mellitus without complications: Secondary | ICD-10-CM | POA: Diagnosis not present

## 2024-05-06 DIAGNOSIS — I4510 Unspecified right bundle-branch block: Secondary | ICD-10-CM | POA: Diagnosis not present

## 2024-05-06 DIAGNOSIS — Z7985 Long-term (current) use of injectable non-insulin antidiabetic drugs: Secondary | ICD-10-CM | POA: Diagnosis not present

## 2024-05-06 DIAGNOSIS — E669 Obesity, unspecified: Secondary | ICD-10-CM | POA: Diagnosis not present

## 2024-05-06 DIAGNOSIS — I5022 Chronic systolic (congestive) heart failure: Secondary | ICD-10-CM

## 2024-05-06 DIAGNOSIS — R918 Other nonspecific abnormal finding of lung field: Secondary | ICD-10-CM | POA: Diagnosis not present

## 2024-05-06 DIAGNOSIS — I110 Hypertensive heart disease with heart failure: Secondary | ICD-10-CM | POA: Diagnosis not present

## 2024-05-06 DIAGNOSIS — Z7901 Long term (current) use of anticoagulants: Secondary | ICD-10-CM | POA: Diagnosis not present

## 2024-05-06 DIAGNOSIS — I428 Other cardiomyopathies: Secondary | ICD-10-CM | POA: Diagnosis not present

## 2024-05-06 DIAGNOSIS — Z6832 Body mass index (BMI) 32.0-32.9, adult: Secondary | ICD-10-CM | POA: Diagnosis not present

## 2024-05-06 DIAGNOSIS — R310 Gross hematuria: Secondary | ICD-10-CM | POA: Diagnosis not present

## 2024-05-06 DIAGNOSIS — T451X5S Adverse effect of antineoplastic and immunosuppressive drugs, sequela: Secondary | ICD-10-CM | POA: Diagnosis not present

## 2024-05-06 DIAGNOSIS — N480 Leukoplakia of penis: Secondary | ICD-10-CM | POA: Diagnosis not present

## 2024-05-06 DIAGNOSIS — Z79899 Other long term (current) drug therapy: Secondary | ICD-10-CM | POA: Diagnosis not present

## 2024-05-06 DIAGNOSIS — Z905 Acquired absence of kidney: Secondary | ICD-10-CM | POA: Diagnosis not present

## 2024-05-06 DIAGNOSIS — Z85528 Personal history of other malignant neoplasm of kidney: Secondary | ICD-10-CM | POA: Diagnosis not present

## 2024-05-06 DIAGNOSIS — Z794 Long term (current) use of insulin: Secondary | ICD-10-CM | POA: Diagnosis not present

## 2024-05-06 LAB — CUP PACEART REMOTE DEVICE CHECK
Battery Remaining Longevity: 154 mo
Battery Voltage: 3.02 V
Brady Statistic RA Percent Paced: INVALID
Brady Statistic RV Percent Paced: 0.02 %
Date Time Interrogation Session: 20251209204817
HighPow Impedance: 53 Ohm
Implantable Lead Connection Status: 753985
Implantable Lead Implant Date: 20241209
Implantable Lead Location: 753860
Implantable Pulse Generator Implant Date: 20241209
Lead Channel Impedance Value: 342 Ohm
Lead Channel Impedance Value: 418 Ohm
Lead Channel Pacing Threshold Amplitude: 0.5 V
Lead Channel Pacing Threshold Pulse Width: 0.4 ms
Lead Channel Sensing Intrinsic Amplitude: 9.8 mV
Lead Channel Setting Pacing Amplitude: 2 V
Lead Channel Setting Pacing Pulse Width: 0.4 ms
Lead Channel Setting Sensing Sensitivity: 0.3 mV
Zone Setting Status: 755011

## 2024-05-06 MED ORDER — ATORVASTATIN CALCIUM 80 MG PO TABS: 80.0000 mg | ORAL_TABLET | Freq: Every day | ORAL | 3 refills | Status: AC

## 2024-05-06 MED ORDER — CARVEDILOL 12.5 MG PO TABS: 12.5000 mg | ORAL_TABLET | Freq: Two times a day (BID) | ORAL | 1 refills | Status: AC

## 2024-05-06 MED ORDER — FUROSEMIDE 40 MG PO TABS: 40.0000 mg | ORAL_TABLET | Freq: Every day | ORAL | 3 refills | Status: AC | PRN

## 2024-05-06 MED ORDER — SACUBITRIL-VALSARTAN 49-51 MG PO TABS: 1.0000 | ORAL_TABLET | Freq: Two times a day (BID) | ORAL | 3 refills | Status: AC

## 2024-05-06 MED ORDER — PERFLUTREN LIPID MICROSPHERE
1.0000 mL | INTRAVENOUS | Status: DC | PRN
  Administered 2024-05-06: 2 mL via INTRAVENOUS

## 2024-05-06 MED ORDER — DIGOXIN 125 MCG PO TABS: 0.1250 mg | ORAL_TABLET | Freq: Every day | ORAL | 3 refills | Status: AC

## 2024-05-06 MED ORDER — SPIRONOLACTONE 25 MG PO TABS: 25.0000 mg | ORAL_TABLET | Freq: Every day | ORAL | 3 refills | Status: AC

## 2024-05-06 NOTE — Telephone Encounter (Signed)
 Called patient to see if he was interested in Cardiac Rehab Program, LVMTCB

## 2024-05-06 NOTE — Telephone Encounter (Signed)
 Pt returned phone call and is interested in the cardiac rehab program. Will pass pt to the nurse navigator for review.

## 2024-05-06 NOTE — Progress Notes (Signed)
 [facility]   PCP:  [name], MD  HF Cardiologist: Dr. Cherrie  HPI: Gregory [name] is a 63 y.o. male with h/o obesity, metastatic R renal cell CA s/p R nephrectomy in 9/17 with the subsequent discovery of 2 pulmonary nodules whom we follow for non-ischemic cardiomyopathy.   Developed microscopic hematuria and found to have large right renal mass c/w renal cell CA. Eventually underwent right radical nephrectomy with adrenal sparing on 02/07/16 at [facility]. Post-op developed bradycardia with HRs in 30s while sleeping. BP stable. When awakened had dizziness so given atropine . ECG revealed sinus brady in 40s with mild QT prolongation which resolved. Echo done and EF 45% with global HK. RV dilated  With normal function .   We saw him for the first time in October 2017. At that time we ordered cMRI and stress test. However in the interim he went back to [facility]. Found to have 2 metastatic lung nodules Echo showed EF at 46%. However MUGA with EF 28% so referred back to us  for further evaluation prior to chemo. Underwent cath 04/09/16 as below. Had chemo and lung nodule resection.    1. 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 Otherwise normal coronaries 3. Nonischemic, dilated cardiomyopathy with EF 30-35% and global hypokinesis   EF 12/21 EF 30% RV normal  Echo 12/13/19 EF 20-25%. RV normal  cMRI 05/29/16 EF 28% No LGE cMRI 2/22 EF 20% RV 20% No LGE    Had sleep study which was negative.    Echo 07/29/19 EF 10-15% Mild RV dysfunction. Underwent aggressive med titration with help of PharmDs  Echo 07/10/22: EF 20-25%. RV ok   cMRI 9/24 1. Moderately dilated LV with diffuse hypokinesis, EF 21%. 2. Normal RV szie with EF 32%. 3. No myocardial LGE, so no definitive evidence for prior MI, infiltrative disease, or myocarditis. 4. Mildly increased extracellular volume percentage, not in  cardiac amyloidosis range.  Underwent MDT ICD 12/24.  Found to be in Afib with RVR at visit with EP 10/25. Felt terrible. Seen in our clinic 03/19/24 and set up for DCCV the same day. SR restored. He was volume overloaded and diuretics were increased.  Admitted 04/01/24 from AHF clinic with a/c HF and AF. Diuresed and loaded with IV amiodarone . He underwent DCCV to NSR. EP consulted and arranged outpatient ablation. Drips weaned, GDMT titrated and he was discharged home, weight 257 lbs.  Underwent AF ablation on 04/20/24 ([place])  Returns for routine f/u. Feels good. No CP or SOB. No palpitations. Not taking lasix . No edema, orthopnea or PND. No bleeding with Eliquis .   Echo today 04/1124 EF 20-25% Personally reviewed    Past Medical History:  Diagnosis Date   Allergy    Anemia    low iron   Anxiety    Atrial fib/flutter, transient (HCC)    Cancer (HCC)    Renal cell cancer   CHF (congestive heart failure) (HCC)    Complex tear of lateral meniscus of right knee as current injury 03/20/2017   Complex tear of medial meniscus of right knee 03/20/2017   Diabetes mellitus without complication (HCC)    History of gastrointestinal hemorrhage    Hypertension    Hypertriglyceridemia    Hypothyroidism    Macular degeneration    lasar surgery- Dr Alvia   Neuropathy    Nonischemic cardiomyopathy (HCC)    EF 25-30% by echo 09/12/16  Plantar warts    PONV (postoperative nausea and vomiting)     a little bit of nausea   Primary localized osteoarthritis of right knee 03/20/2017   Renal cell carcinoma [facility])    s/p nephrectomy 02/07/16 at [facility])   Past Surgical History:  Procedure Laterality Date   ATRIAL FIBRILLATION ABLATION N/A 04/20/2024   Procedure: ATRIAL FIBRILLATION ABLATION;  Surgeon: Inocencio Soyla Lunger, MD;  Location: [facility] INVASIVE CV LAB;  Service: Cardiovascular;  Laterality: N/A;   CARDIAC CATHETERIZATION N/A 04/19/2016   Procedure: Left Heart Cath and Coronary  Angiography;  Surgeon: Toribio JONELLE Fuel, MD;  Location: [facility] INVASIVE CV LAB;  Service: Cardiovascular;  Laterality: N/A;   CARDIOVERSION N/A 03/19/2024   Procedure: CARDIOVERSION;  Surgeon: Fuel Toribio JONELLE, MD;  Location: [facility] INVASIVE CV LAB;  Service: Cardiovascular;  Laterality: N/A;   CARDIOVERSION N/A 04/02/2024   Procedure: CARDIOVERSION;  Surgeon: Fuel Toribio JONELLE, MD;  Location: [facility] INVASIVE CV LAB;  Service: Cardiovascular;  Laterality: N/A;   CHONDROPLASTY Right 03/20/2017   Procedure: CHONDROPLASTY;  Surgeon: Josefina Chew, MD;  Location: [facility] OR;  Service: Orthopedics;  Laterality: Right;   COLONOSCOPY  03/22/2005   EYE SURGERY Right    laser surgery, right eye macular degeneration   ICD IMPLANT N/A 05/05/2023   Procedure: ICD IMPLANT;  Surgeon: Fernande Elspeth BROCKS, MD;  Location: [facility] INVASIVE CV LAB;  Service: Cardiovascular;  Laterality: N/A;   KNEE ARTHROSCOPY Right    x 2   KNEE ARTHROSCOPY WITH MEDIAL MENISECTOMY Right 03/20/2017   Procedure: KNEE ARTHROSCOPY WITH MEDIAL AND LATERAL MENISECTOMY;  Surgeon: Josefina Chew, MD;  Location: [facility] OR;  Service: Orthopedics;  Laterality: Right;   NEPHRECTOMY Right    orif fx fibula     REVERSE SHOULDER ARTHROPLASTY Left 06/20/2023   Procedure: REVERSE SHOULDER ARTHROPLASTY REVISION;  Surgeon: [name] Kemps, MD;  Location: [facility] ORS;  Service: Orthopedics;  Laterality: Left;   sebaceous cyst excision  1985   TONSILLECTOMY     TOTAL SHOULDER ARTHROPLASTY Left 11/11/2014   Procedure: LEFT TOTAL SHOULDER ARTHROPLASTY;  Surgeon: Kemps [name], MD;  Location: [facility] OR;  Service: Orthopedics;  Laterality: Left;   TOTAL SHOULDER REVISION Left 06/06/2023   Procedure: SHOULDER ARTHROPLASTY REVISION;again FEB 2025   Surgeon: [name] Kemps, MD;  Location: [facility] ORS;  Service: Orthopedics;  Laterality: Left;  with interscalene block   VASECTOMY     Current Outpatient Medications  Medication Sig Dispense Refill   amiodarone  (PACERONE ) 200 MG tablet Take  200 mg by mouth daily.     apixaban  (ELIQUIS ) 5 MG TABS tablet Take 1 tablet (5 mg total) by mouth 2 (two) times daily. 180 tablet 3   atorvastatin  (LIPITOR ) 80 MG tablet Take 1 tablet (80 mg total) by mouth daily. 90 tablet 3   carvedilol  (COREG ) 12.5 MG tablet Take 1 tablet (12.5 mg total) by mouth 2 (two) times daily with a meal. 180 tablet 1   cetirizine (ZYRTEC) 10 MG tablet Take 10 mg by mouth daily.     Cholecalciferol  (VITAMIN D -3 PO) Take 1 capsule by mouth in the morning and at bedtime.     digoxin  (LANOXIN ) 0.125 MG tablet TAKE 1 TABLET(125 MCG) BY MOUTH DAILY 90 tablet 3   FIASP  FLEXTOUCH 100 UNIT/ML FlexTouch Pen Inject 15-35 Units into the skin 3 (three) times daily before meals. Dose per sliding scale     fluticasone  (CUTIVATE ) 0.05 % cream Apply 1 Application topically daily as needed (irritation).     furosemide  (LASIX )  40 MG tablet TAKE 1 TABLET BY MOUTH THREE TIMES A WEEK, ON MONDAY, WEDNESDAY, AND FRIDAY. MAY TAKE ADDITIONAL TABLETS AS NEEDED. 180 tablet 3   levothyroxine  (SYNTHROID , LEVOTHROID) 25 MCG tablet TAKE 1 TABLET ONCE DAILY BEFORE BREAKFAST 30 tablet 0   metFORMIN  (GLUCOPHAGE -XR) 500 MG 24 hr tablet Take 500 mg by mouth daily.     PARoxetine  (PAXIL ) 30 MG tablet Take 1 tablet (30 mg total) by mouth daily. 90 tablet 1   sacubitril -valsartan  (ENTRESTO ) 49-51 MG Take 1 tablet by mouth 2 (two) times daily. 60 tablet 3   spironolactone  (ALDACTONE ) 25 MG tablet Take 1 tablet (25 mg total) by mouth daily. 30 tablet 6   tirzepatide  (MOUNJARO ) 15 MG/0.5ML Pen Inject 15 mg into the skin once a week. 2 mL 0   TRESIBA FLEXTOUCH 100 UNIT/ML FlexTouch Pen Inject 14 Units into the skin at bedtime.     No current facility-administered medications for this encounter.   Facility-Administered Medications Ordered in Other Encounters  Medication Dose Route Frequency Provider Last Rate Last Admin   perflutren lipid microspheres (DEFINITY) IV suspension  1-10 mL Intravenous PRN  Jane Broughton, Toribio SAUNDERS, MD   2 mL at 05/06/24 1334   Allergies:   Patient has no known allergies.   Social History:  The patient  reports that he has never smoked. He has never used smokeless tobacco. He reports current alcohol use. He reports that he does not use drugs.   Family History:  The patient's family history includes Bipolar disorder in his mother; Cancer (age of onset: 37) in his mother; Hypertension in his mother; Other in his father.   ROS:  Please see the history of present illness.   All other systems are personally reviewed and negative.   Wt Readings from Last 3 Encounters:  05/06/24 115.2 kg (254 lb)  04/26/24 114.8 kg (253 lb)  04/20/24 114.3 kg (252 lb)   BP 120/84   Pulse 64   Wt 115.2 kg (254 lb)   SpO2 98%   BMI 32.61 kg/m   Physical Exam:   General:  Sitting up. No resp difficulty HEENT: normal Neck: supple. no JVD.  Cor: Regular rate & rhythm. No rubs, gallops or murmurs. Lungs: clear Abdomen: obese soft, nontender, nondistended.Good bowel sounds. Extremities: no cyanosis, clubbing, rash, edema Neuro: alert & orientedx3, cranial nerves grossly intact. moves all 4 extremities w/o difficulty. Affect pleasant   ECG (personally reviewed from 04/06/24): NSR 1AVB  Device interrogation (personally reviewed): OptiVol stable, thoracic impedence at reference line, 1.7 hr/day activity, no AF since DCCV, no VT  ASSESSMENT AND PLAN: 1. Chronic systolic HF due to NICM  - Unclear etiology. EF 30-35% by cath with NICM. cMRI 1/18 with EF 28%.  - Etiology remains unclear - possible due to immuno-therapy. No evidence of scar of infiltrative process on MRI.  - Echo 4/18 EF 35-40%. Echo 04/2017 EF 50-55% - Echo 3/21 EF 10-15% Mild RV - Echo 7/21 EF 20-25%. RV normal.  - Echo 12/21 EF 30%  - cMRI 2/22 EF 20% RV 20% No LGE - Echo 11/22 EF 25-30% RV ok   - Echo 2/24: EF 20-25%. RV ok  - cMRI 9/24 EF 21% RV EF 32% No LGE - Echo today 05/06/24 EF 20-25% Personally  reviewed - s/p MDT 12/24 - ICD interrogated no recurrent AF. Volume trending up. No VT Personally reviewed - NYHA II, volume trending up. - Reminded him to take lasix  as needed - Continue Coreg  12.5 mg bid -  Continue Entresto  49/51 mg bid   - Continue spiro 25 mg daily. - Continue digoxin  0.125 mg daily - No SGLT2i with Type I DM. - Genetic evaluation was negative - Overall stable NYHA II. No room to titrate GDMT - Discussed the fact that he may need VAD consideration at some point   2. Paroxysmal atrial fibrillation - recurrence 10/31 - S/p DCCV to SR 03/19/24 - s/p DCCV to NSR 11/25 - s/p AF ablation 04/20/24  - Remains in NSR - Continue Eliquis  5 mg bid. No bleeding issues   3. Metastatic Renal Cell CA with lung nodules - s/p nephrectomy.  - Follows at [facility].  - Felt to be in remission > 8 years    4. HTN  - BP ok    5. CAD - 1v in small RCA.  - No s/s angina - No AC with Eliquis  - Continue atorva 80 mg daily   6. 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 - Followed by Dr. Faythe at [place].   7. Obesity - Body mass index is 32.61 kg/m. - He is on GLP1.  Toribio Fuel, MD  3:01 PM

## 2024-05-06 NOTE — Patient Instructions (Addendum)
 Medication Changes:  TAKE LASIX  ONLY AS NEEDED FOR SWELLING OR WEIGHT GAIN OF 3 POUNDS OVERNIGHT OR 5 POUNDS IN ONE WEEK   Follow-Up in: 4 MONTHS WITH DR. CHERRIE PLEASE CALL OUR OFFICE AROUND FEBRUARY TO GET SCHEDULED FOR YOUR APPOINTMENT. PHONE NUMBER IS [phone] OPTION 2   At the [facility], you and your health needs are our priority. We have a designated team specialized in the treatment of Heart Failure. This Care Team includes your primary Heart Failure Specialized Cardiologist (physician), Advanced Practice Providers (APPs- Physician Assistants and Nurse Practitioners), and Pharmacist who all work together to provide you with the care you need, when you need it.   You may see any of the following providers on your designated Care Team at your next follow up:  Dr. Toribio Cherrie Dr. Ezra Shuck Dr. Odis Brownie Greig Mosses, NP Caffie Shed, [place] [facility] [place], [place] Beckey Coe, NP Jordan Lee, NP Tinnie Redman, PharmD   Please be sure to bring in all your medications bottles to every appointment.   Need to Contact Us :  If you have any questions or concerns before your next appointment please send us  a message through [place] or call our office at [phone].    TO LEAVE A MESSAGE FOR THE NURSE SELECT OPTION 2, PLEASE LEAVE A MESSAGE INCLUDING: YOUR NAME DATE OF BIRTH CALL BACK NUMBER REASON FOR CALL**this is important as we prioritize the call backs  YOU WILL RECEIVE A CALL BACK THE SAME DAY AS LONG AS YOU CALL BEFORE 4:00 PM

## 2024-05-06 NOTE — Progress Notes (Signed)
°  Echocardiogram 2D Echocardiogram has been performed.  Gregory [name] 05/06/2024, 1:34 PM

## 2024-05-07 LAB — ECHOCARDIOGRAM COMPLETE
AR max vel: 2.53 cm2
AV Area VTI: 2.43 cm2
AV Area mean vel: 2.44 cm2
AV Mean grad: 3 mmHg
AV Peak grad: 4.3 mmHg
Ao pk vel: 1.04 m/s
Area-P 1/2: 5.2 cm2
Calc EF: 33 %
Est EF: 20
MV VTI: 1.72 cm2
S' Lateral: 5.1 cm
Single Plane A2C EF: 27.4 %
Single Plane A4C EF: 40 %

## 2024-05-13 ENCOUNTER — Ambulatory Visit: Payer: Self-pay | Admitting: Cardiology

## 2024-05-13 NOTE — Progress Notes (Signed)
 Remote ICD Transmission

## 2024-05-17 ENCOUNTER — Encounter: Payer: Self-pay | Admitting: Internal Medicine

## 2024-05-18 ENCOUNTER — Encounter (HOSPITAL_COMMUNITY): Payer: Self-pay | Admitting: Internal Medicine

## 2024-05-18 ENCOUNTER — Ambulatory Visit (HOSPITAL_COMMUNITY)
Admission: RE | Admit: 2024-05-18 | Discharge: 2024-05-18 | Disposition: A | Discharge status: Home / Self Care | Source: Ambulatory Visit | Attending: Internal Medicine | Admitting: Internal Medicine

## 2024-05-18 VITALS — BP 116/90 | HR 71 | Ht 74.0 in | Wt 247.2 lb

## 2024-05-18 DIAGNOSIS — I480 Paroxysmal atrial fibrillation: Secondary | ICD-10-CM

## 2024-05-18 DIAGNOSIS — D6869 Other thrombophilia: Secondary | ICD-10-CM

## 2024-05-18 DIAGNOSIS — Z5181 Encounter for therapeutic drug level monitoring: Secondary | ICD-10-CM | POA: Diagnosis not present

## 2024-05-18 DIAGNOSIS — Z79899 Other long term (current) drug therapy: Secondary | ICD-10-CM | POA: Diagnosis not present

## 2024-05-18 NOTE — Progress Notes (Signed)
 "   Primary Care Physician: [name], MD Primary Cardiologist: None Electrophysiologist: Will Gladis Norton, MD     Referring Physician: Dr. Norton Bare Gregory [name] is a 63 y.o. male with a history of NICM, T2DM, CHF, ICD, HTN, hyperlipidemia, and paroxysmal atrial fibrillation who presents for consultation in the [facility]. Patient is on Eliquis  for stroke prevention.  On evaluation today, patient is currently in NSR. S/p Afib ablation on 04/20/2024 by Dr. Norton. No episodes of Afib since ablation.  He is taking amiodarone  200 mg daily.  He notes more energy since ablation.  No chest pain or SOB. Leg sites healed without issue. No missed doses of anticoagulant.  Today, he denies symptoms of orthopnea, PND, lower extremity edema, dizziness, presyncope, syncope, snoring, daytime somnolence, bleeding, or neurologic sequela. The patient is tolerating medications without difficulties and is otherwise without complaint today.    he has a BMI of Body mass index is 31.74 kg/m.SABRA Filed Weights   05/18/24 1036  Weight: 112.1 kg    Current Outpatient Medications  Medication Sig Dispense Refill   amiodarone  (PACERONE ) 200 MG tablet Take 200 mg by mouth daily.     apixaban  (ELIQUIS ) 5 MG TABS tablet Take 1 tablet (5 mg total) by mouth 2 (two) times daily. 180 tablet 3   atorvastatin  (LIPITOR ) 80 MG tablet Take 1 tablet (80 mg total) by mouth daily. 90 tablet 3   carvedilol  (COREG ) 12.5 MG tablet Take 1 tablet (12.5 mg total) by mouth 2 (two) times daily with a meal. 180 tablet 1   cetirizine (ZYRTEC) 10 MG tablet Take 10 mg by mouth daily.     Cholecalciferol  (VITAMIN D -3 PO) Take 1 capsule by mouth in the morning and at bedtime.     digoxin  (LANOXIN ) 0.125 MG tablet Take 1 tablet (0.125 mg total) by mouth daily. 90 tablet 3   FIASP  FLEXTOUCH 100 UNIT/ML FlexTouch Pen Inject 15-35 Units into the skin 3 (three) times daily before meals. Dose per sliding  scale     fluticasone  (CUTIVATE ) 0.05 % cream Apply 1 Application topically daily as needed (irritation).     furosemide  (LASIX ) 40 MG tablet Take 1 tablet (40 mg total) by mouth daily as needed. TAKE ONLY AS NEEDED FOR SWELLING OR WEIGHT GAIN OF 3 POUNDS OVERNIGHT OR 5 POUNDS IN 1 WEEK 30 tablet 3   levothyroxine  (SYNTHROID , LEVOTHROID) 25 MCG tablet TAKE 1 TABLET ONCE DAILY BEFORE BREAKFAST 30 tablet 0   metFORMIN  (GLUCOPHAGE -XR) 500 MG 24 hr tablet Take 500 mg by mouth daily.     PARoxetine  (PAXIL ) 30 MG tablet Take 1 tablet (30 mg total) by mouth daily. 90 tablet 1   sacubitril -valsartan  (ENTRESTO ) 49-51 MG Take 1 tablet by mouth 2 (two) times daily. 180 tablet 3   spironolactone  (ALDACTONE ) 25 MG tablet Take 1 tablet (25 mg total) by mouth daily. 90 tablet 3   tirzepatide  (MOUNJARO ) 15 MG/0.5ML Pen Inject 15 mg into the skin once a week. 2 mL 0   TRESIBA FLEXTOUCH 100 UNIT/ML FlexTouch Pen Inject 14 Units into the skin at bedtime.     No current facility-administered medications for this encounter.    Atrial Fibrillation Management history:  Previous antiarrhythmic drugs: Amiodarone  Previous cardioversions: 03/19/2024, 04/02/2024 Previous ablations: 04/20/2024 Anticoagulation history: Eliquis    ROS- All systems are reviewed and negative except as per the HPI above.  Physical Exam: BP (!) 116/90   Pulse 71   Ht 6'  2 (1.88 m)   Wt 112.1 kg   BMI 31.74 kg/m   GEN: Well nourished, well developed in no acute distress NECK: No JVD; No carotid bruits CARDIAC: Regular rate and rhythm, no murmurs, rubs, gallops RESPIRATORY:  Clear to auscultation without rales, wheezing or rhonchi  ABDOMEN: Soft, non-tender, non-distended EXTREMITIES:  No edema; No deformity   EKG today demonstrates  EKG Interpretation Date/Time:  Tuesday May 18 2024 10:39:16 EST Ventricular Rate:  71 PR Interval:  220 QRS Duration:  164 QT Interval:  470 QTC Calculation: 510 R Axis:   260  Text  Interpretation: Sinus rhythm with 1st degree A-V block Possible Left atrial enlargement Right bundle branch block Anterior infarct , age undetermined Abnormal ECG When compared with ECG of 06-May-2024 14:24, Premature ventricular complexes are no longer Present Confirmed by Terra Pac ([id number]) on 05/18/2024 10:54:09 AM    Echo 05/06/2024 demonstrated  1. Left ventricular ejection fraction, by estimation, is 20%. The left  ventricle has severely decreased function. The left ventricle demonstrates  global hypokinesis. The left ventricular internal cavity size was mildly  to moderately dilated. There is  mild left ventricular hypertrophy. Left ventricular diastolic parameters  are consistent with Grade II diastolic dysfunction (pseudonormalization).   2. Right ventricular systolic function is normal. The right ventricular  size is normal. Tricuspid regurgitation signal is inadequate for assessing  PA pressure.   3. Left atrial size was moderately dilated.   4. The mitral valve is grossly normal. Trivial mitral valve  regurgitation. No evidence of mitral stenosis.   5. The aortic valve is tricuspid. There is mild calcification of the  aortic valve. Aortic valve regurgitation is trivial. No aortic stenosis is  present.   6. The inferior vena cava is normal in size with greater than 50%  respiratory variability, suggesting right atrial pressure of 3 mmHg.   ASSESSMENT & PLAN CHA2DS2-VASc Score = 3  The patient's score is based upon: CHF History: 1 HTN History: 1 Diabetes History: 1 Stroke History: 0 Vascular Disease History: 0 Age Score: 0 Gender Score: 0       ASSESSMENT AND PLAN: Paroxysmal Atrial Fibrillation (ICD10:  I48.0) The patient's CHA2DS2-VASc score is 3, indicating a 3.2% annual risk of stroke.   S/p A-fib ablation on 04/20/2024 by Dr. Inocencio.  Patient is currently in NSR.  Continue Coreg  12.5 mg twice daily.  We discussed what to expect in the recovery period  following ablation.  Anticipate patient will be able to discontinue amiodarone  at upcoming appointment.  High risk medication monitoring (ICD10: J342684) Patient requires ongoing monitoring for anti-arrhythmic medication which has the potential to cause life threatening arrhythmias or AV block. Qtc stable. Continue amiodarone  200 mg daily.   Secondary Hypercoagulable State (ICD10:  D68.69) The patient is at significant risk for stroke/thromboembolism based upon his CHA2DS2-VASc Score of 3.  Continue Apixaban  (Eliquis ).  Continue Eliquis  5 mg twice daily without interruption in the blanking period.    Follow up with A-fib clinic as scheduled.   Terra Pac, [facility]  Afib Clinic [address] [place], [place] [address] [phone]  "

## 2024-05-18 NOTE — Addendum Note (Signed)
 Addended by: DAYNE SHERRY RAMAN on: 05/18/2024 01:33 PM   Modules accepted: Orders

## 2024-05-26 ENCOUNTER — Telehealth: Payer: Self-pay | Admitting: Cardiology

## 2024-05-26 ENCOUNTER — Other Ambulatory Visit: Payer: Self-pay | Admitting: Urology

## 2024-05-26 NOTE — Telephone Encounter (Signed)
 Pharmacy please advise on holding Eliquis  prior to Cystoscopy with Possible Meatal Dilation scheduled for 07/14/2023. Thank you.

## 2024-05-26 NOTE — Telephone Encounter (Signed)
"  ° °  Pre-operative Risk Assessment    Patient Name: Gregory [name]  DOB: 09/08/1960 MRN: [id number]   Date of last office visit: 04/06/24 Date of next office visit: Not yet scheduled   Request for Surgical Clearance    Procedure:  Cystoscopy with Possible Meatal Dilation  Date of Surgery:  Clearance 07/13/24                                Surgeon:  Dr. Arlyss Foot Surgeon's Group or Practice Name:  [facility]  Phone number:  [phone]  Fax number:  [phone]   Type of Clearance Requested:   - Pharmacy:  Hold Apixaban  (Eliquis )     Type of Anesthesia:  General    Additional requests/questions:  Caller stated patient will need pharmacy clearance.    Signed, Jasmin B Wilson   05/26/2024, 10:37 AM   "

## 2024-05-28 NOTE — Telephone Encounter (Signed)
 Patient with diagnosis of A Fib on Eliquis  for anticoagulation.  Patient has Ablation on 04/20/24.  Procedure: Cystoscopy with Possible Meatal Dilation  Date of procedure: 07/13/24   CHA2DS2-VASc Score = 3  This indicates a 3.2% annual risk of stroke. The patient's score is based upon: CHF History: 1 HTN History: 1 Diabetes History: 1 Stroke History: 0 Vascular Disease History: 0 Age Score: 0 Gender Score: 0     CrCl 74 ml/min Platelet count 202k  Patient has had an Afib/aflutter ablation in the last 3 months, DCCV within the last 4 weeks or a watchman implanted in the last 45 days   Patient can not hold anticoagulation until 07/21/24 at the earliest. Procedure will need to be rescheduled.   **This guidance is not considered finalized until pre-operative APP has relayed final recommendations.**

## 2024-05-28 NOTE — Telephone Encounter (Signed)
"  ° °  Patient Name: Gregory [name]  DOB: 1961-05-08 MRN: [id number]  Primary Cardiologist: None  Clinical pharmacists have reviewed the patient's past medical history, labs, and current medications as part of preoperative protocol coverage. The following recommendations have been made:  Patient can not hold anticoagulation until 07/21/24 at the earliest. Procedure will need to be rescheduled.   I will route this recommendation to the requesting party via Epic fax function and remove from pre-op  pool.  Please call with questions.  Rykin Route E Jasemine Nawaz, NP 05/28/2024, 3:36 PM   "

## 2024-05-31 ENCOUNTER — Encounter: Payer: Self-pay | Admitting: Internal Medicine

## 2024-06-09 ENCOUNTER — Ambulatory Visit (HOSPITAL_COMMUNITY)

## 2024-06-15 ENCOUNTER — Telehealth: Payer: Self-pay | Admitting: Cardiology

## 2024-06-15 NOTE — Telephone Encounter (Signed)
 Zona from [facility] calling to Dr. Kem that per medical clearance the procedure has been rescheduled till 03/18. Please advise.

## 2024-06-28 ENCOUNTER — Telehealth (HOSPITAL_COMMUNITY): Payer: Self-pay | Admitting: Internal Medicine

## 2024-07-21 ENCOUNTER — Ambulatory Visit (HOSPITAL_COMMUNITY): Admitting: Internal Medicine

## 2024-08-04 ENCOUNTER — Ambulatory Visit

## 2024-08-11 ENCOUNTER — Ambulatory Visit (HOSPITAL_COMMUNITY): Admit: 2024-08-11 | Admitting: Urology

## 2024-08-11 SURGERY — CYSTOSCOPY
Anesthesia: General

## 2024-09-07 ENCOUNTER — Ambulatory Visit (HOSPITAL_COMMUNITY): Admitting: Internal Medicine

## 2024-11-03 ENCOUNTER — Ambulatory Visit

## 2024-12-01 ENCOUNTER — Encounter (INDEPENDENT_AMBULATORY_CARE_PROVIDER_SITE_OTHER): Admitting: Ophthalmology

## 2025-02-02 ENCOUNTER — Ambulatory Visit

## 2025-05-04 ENCOUNTER — Ambulatory Visit

## 2025-08-03 ENCOUNTER — Ambulatory Visit
